# Patient Record
Sex: Female | Born: 1980
Health system: Southern US, Community
[De-identification: ages and names within clinical notes are randomized; demographics above are authoritative.]

## PROBLEM LIST (undated history)

## (undated) ENCOUNTER — Emergency Department (HOSPITAL_COMMUNITY): Disposition: A | Discharge status: Home / Self Care | Payer: BLUE CROSS/BLUE SHIELD

## (undated) ENCOUNTER — Ambulatory Visit (HOSPITAL_COMMUNITY): Admission: EM | Payer: 59

## (undated) ENCOUNTER — Emergency Department (HOSPITAL_COMMUNITY): Admission: EM | Payer: BLUE CROSS/BLUE SHIELD | Source: Home / Self Care

## (undated) DIAGNOSIS — L0291 Cutaneous abscess, unspecified: Secondary | ICD-10-CM

## (undated) DIAGNOSIS — K089 Disorder of teeth and supporting structures, unspecified: Secondary | ICD-10-CM

## (undated) DIAGNOSIS — K219 Gastro-esophageal reflux disease without esophagitis: Secondary | ICD-10-CM

## (undated) DIAGNOSIS — M67479 Ganglion, unspecified ankle and foot: Secondary | ICD-10-CM

## (undated) DIAGNOSIS — N644 Mastodynia: Secondary | ICD-10-CM

## (undated) DIAGNOSIS — M1990 Unspecified osteoarthritis, unspecified site: Secondary | ICD-10-CM

## (undated) DIAGNOSIS — N926 Irregular menstruation, unspecified: Secondary | ICD-10-CM

## (undated) DIAGNOSIS — L0293 Carbuncle, unspecified: Secondary | ICD-10-CM

## (undated) DIAGNOSIS — N979 Female infertility, unspecified: Secondary | ICD-10-CM

## (undated) DIAGNOSIS — N83209 Unspecified ovarian cyst, unspecified side: Secondary | ICD-10-CM

## (undated) DIAGNOSIS — B9689 Other specified bacterial agents as the cause of diseases classified elsewhere: Secondary | ICD-10-CM

## (undated) DIAGNOSIS — A599 Trichomoniasis, unspecified: Secondary | ICD-10-CM

## (undated) DIAGNOSIS — L0292 Furuncle, unspecified: Secondary | ICD-10-CM

## (undated) DIAGNOSIS — Q512 Other doubling of uterus, unspecified: Secondary | ICD-10-CM

## (undated) DIAGNOSIS — R1900 Intra-abdominal and pelvic swelling, mass and lump, unspecified site: Secondary | ICD-10-CM

## (undated) DIAGNOSIS — Z8616 Personal history of COVID-19: Secondary | ICD-10-CM

## (undated) DIAGNOSIS — N760 Acute vaginitis: Secondary | ICD-10-CM

## (undated) DIAGNOSIS — Q5128 Other doubling of uterus, other specified: Secondary | ICD-10-CM

## (undated) DIAGNOSIS — H8309 Labyrinthitis, unspecified ear: Secondary | ICD-10-CM

## (undated) HISTORY — DX: Carbuncle, unspecified: L02.93

## (undated) HISTORY — DX: Furuncle, unspecified: L02.92

## (undated) HISTORY — DX: Mastodynia: N64.4

## (undated) HISTORY — DX: Labyrinthitis, unspecified ear: H83.09

## (undated) HISTORY — DX: Irregular menstruation, unspecified: N92.6

## (undated) HISTORY — DX: Unspecified ovarian cyst, unspecified side: N83.209

## (undated) HISTORY — DX: Female infertility, unspecified: N97.9

## (undated) HISTORY — PX: LAPAROSCOPIC SALPINGOOPHERECTOMY: SUR795

## (undated) HISTORY — PX: OTHER SURGICAL HISTORY: SHX169

## (undated) HISTORY — PX: INCISION AND DRAINAGE: SHX5863

## (undated) HISTORY — DX: Other doubling of uterus, unspecified: Q51.20

## (undated) HISTORY — DX: Disorder of teeth and supporting structures, unspecified: K08.9

---
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
## 1999-05-14 ENCOUNTER — Emergency Department (HOSPITAL_COMMUNITY): Admission: EM | Admit: 1999-05-14 | Discharge: 1999-05-14 | Payer: Self-pay | Admitting: Emergency Medicine

## 1999-06-16 ENCOUNTER — Emergency Department (HOSPITAL_COMMUNITY): Admission: EM | Admit: 1999-06-16 | Discharge: 1999-06-16 | Payer: Self-pay | Admitting: Emergency Medicine

## 1999-06-16 ENCOUNTER — Encounter: Payer: Self-pay | Admitting: Emergency Medicine

## 1999-06-26 ENCOUNTER — Emergency Department (HOSPITAL_COMMUNITY): Admission: EM | Admit: 1999-06-26 | Discharge: 1999-06-26 | Payer: Self-pay | Admitting: Emergency Medicine

## 2000-07-26 ENCOUNTER — Emergency Department (HOSPITAL_COMMUNITY): Admission: EM | Admit: 2000-07-26 | Discharge: 2000-07-26 | Payer: Self-pay | Admitting: Internal Medicine

## 2000-07-28 ENCOUNTER — Emergency Department (HOSPITAL_COMMUNITY): Admission: EM | Admit: 2000-07-28 | Discharge: 2000-07-28 | Payer: Self-pay | Admitting: Emergency Medicine

## 2001-07-04 ENCOUNTER — Inpatient Hospital Stay (HOSPITAL_COMMUNITY): Admission: AD | Admit: 2001-07-04 | Discharge: 2001-07-04 | Payer: Self-pay | Admitting: *Deleted

## 2001-09-10 ENCOUNTER — Emergency Department (HOSPITAL_COMMUNITY): Admission: EM | Admit: 2001-09-10 | Discharge: 2001-09-10 | Payer: Self-pay | Admitting: Emergency Medicine

## 2001-09-10 ENCOUNTER — Encounter: Payer: Self-pay | Admitting: Emergency Medicine

## 2002-04-26 ENCOUNTER — Inpatient Hospital Stay (HOSPITAL_COMMUNITY): Admission: AD | Admit: 2002-04-26 | Discharge: 2002-04-26 | Payer: Self-pay

## 2002-04-27 ENCOUNTER — Encounter: Payer: Self-pay | Admitting: *Deleted

## 2002-04-27 ENCOUNTER — Inpatient Hospital Stay (HOSPITAL_COMMUNITY): Admission: AD | Admit: 2002-04-27 | Discharge: 2002-04-27 | Payer: Self-pay | Admitting: *Deleted

## 2002-05-25 ENCOUNTER — Encounter: Admission: RE | Admit: 2002-05-25 | Discharge: 2002-05-25 | Payer: Self-pay | Admitting: Obstetrics and Gynecology

## 2002-06-27 ENCOUNTER — Encounter: Admission: RE | Admit: 2002-06-27 | Discharge: 2002-06-27 | Payer: Self-pay | Admitting: Obstetrics and Gynecology

## 2002-07-13 ENCOUNTER — Ambulatory Visit (HOSPITAL_COMMUNITY): Admission: RE | Admit: 2002-07-13 | Discharge: 2002-07-13 | Payer: Self-pay | Admitting: Obstetrics and Gynecology

## 2002-07-13 ENCOUNTER — Encounter: Payer: Self-pay | Admitting: Obstetrics and Gynecology

## 2002-07-27 ENCOUNTER — Inpatient Hospital Stay (HOSPITAL_COMMUNITY): Admission: AD | Admit: 2002-07-27 | Discharge: 2002-07-27 | Payer: Self-pay | Admitting: Obstetrics & Gynecology

## 2002-11-22 ENCOUNTER — Emergency Department (HOSPITAL_COMMUNITY): Admission: EM | Admit: 2002-11-22 | Discharge: 2002-11-23 | Payer: Self-pay | Admitting: Emergency Medicine

## 2002-11-23 ENCOUNTER — Encounter: Payer: Self-pay | Admitting: Family Medicine

## 2003-07-06 ENCOUNTER — Inpatient Hospital Stay (HOSPITAL_COMMUNITY): Admission: AD | Admit: 2003-07-06 | Discharge: 2003-07-06 | Payer: Self-pay | Admitting: Family Medicine

## 2003-09-13 ENCOUNTER — Emergency Department (HOSPITAL_COMMUNITY): Admission: EM | Admit: 2003-09-13 | Discharge: 2003-09-13 | Payer: Self-pay | Admitting: Family Medicine

## 2004-03-23 ENCOUNTER — Emergency Department (HOSPITAL_COMMUNITY): Admission: EM | Admit: 2004-03-23 | Discharge: 2004-03-23 | Payer: Self-pay | Admitting: Emergency Medicine

## 2004-05-13 ENCOUNTER — Emergency Department (HOSPITAL_COMMUNITY): Admission: EM | Admit: 2004-05-13 | Discharge: 2004-05-13 | Payer: Self-pay | Admitting: Emergency Medicine

## 2004-05-21 ENCOUNTER — Ambulatory Visit: Payer: Self-pay | Admitting: Internal Medicine

## 2004-05-22 ENCOUNTER — Emergency Department (HOSPITAL_COMMUNITY): Admission: EM | Admit: 2004-05-22 | Discharge: 2004-05-22 | Payer: Self-pay | Admitting: Family Medicine

## 2004-05-29 ENCOUNTER — Ambulatory Visit: Payer: Self-pay | Admitting: Internal Medicine

## 2004-07-22 ENCOUNTER — Ambulatory Visit: Payer: Self-pay | Admitting: Internal Medicine

## 2004-09-08 ENCOUNTER — Ambulatory Visit: Payer: Self-pay | Admitting: *Deleted

## 2004-12-29 ENCOUNTER — Ambulatory Visit: Payer: Self-pay | Admitting: Internal Medicine

## 2005-01-15 ENCOUNTER — Ambulatory Visit: Payer: Self-pay | Admitting: Internal Medicine

## 2005-01-15 ENCOUNTER — Encounter: Payer: Self-pay | Admitting: Internal Medicine

## 2005-01-15 ENCOUNTER — Encounter (INDEPENDENT_AMBULATORY_CARE_PROVIDER_SITE_OTHER): Payer: Self-pay | Admitting: Internal Medicine

## 2005-04-27 ENCOUNTER — Ambulatory Visit: Payer: Self-pay | Admitting: Internal Medicine

## 2005-04-27 ENCOUNTER — Encounter (INDEPENDENT_AMBULATORY_CARE_PROVIDER_SITE_OTHER): Payer: Self-pay | Admitting: Internal Medicine

## 2005-04-27 LAB — CONVERTED CEMR LAB: TSH: 1.874 microintl units/mL

## 2005-05-01 ENCOUNTER — Ambulatory Visit: Payer: Self-pay | Admitting: Internal Medicine

## 2005-05-28 ENCOUNTER — Ambulatory Visit: Payer: Self-pay | Admitting: Internal Medicine

## 2005-07-20 ENCOUNTER — Ambulatory Visit: Payer: Self-pay | Admitting: Internal Medicine

## 2005-10-19 ENCOUNTER — Ambulatory Visit: Payer: Self-pay | Admitting: Family Medicine

## 2005-11-09 ENCOUNTER — Ambulatory Visit: Payer: Self-pay | Admitting: Internal Medicine

## 2006-01-26 ENCOUNTER — Ambulatory Visit: Payer: Self-pay | Admitting: Family Medicine

## 2006-02-27 ENCOUNTER — Emergency Department (HOSPITAL_COMMUNITY): Admission: EM | Admit: 2006-02-27 | Discharge: 2006-02-27 | Payer: Self-pay | Admitting: Emergency Medicine

## 2006-03-02 ENCOUNTER — Ambulatory Visit: Payer: Self-pay | Admitting: Internal Medicine

## 2006-03-03 ENCOUNTER — Ambulatory Visit: Payer: Self-pay | Admitting: Family Medicine

## 2006-03-05 ENCOUNTER — Ambulatory Visit: Payer: Self-pay | Admitting: Internal Medicine

## 2006-06-21 ENCOUNTER — Emergency Department (HOSPITAL_COMMUNITY): Admission: EM | Admit: 2006-06-21 | Discharge: 2006-06-21 | Payer: Self-pay | Admitting: Emergency Medicine

## 2006-06-22 ENCOUNTER — Ambulatory Visit: Payer: Self-pay | Admitting: Family Medicine

## 2006-07-14 ENCOUNTER — Ambulatory Visit: Payer: Self-pay | Admitting: Internal Medicine

## 2006-08-26 ENCOUNTER — Emergency Department (HOSPITAL_COMMUNITY): Admission: EM | Admit: 2006-08-26 | Discharge: 2006-08-26 | Payer: Self-pay | Admitting: Emergency Medicine

## 2006-08-27 ENCOUNTER — Emergency Department (HOSPITAL_COMMUNITY): Admission: EM | Admit: 2006-08-27 | Discharge: 2006-08-27 | Payer: Self-pay | Admitting: Emergency Medicine

## 2006-09-11 ENCOUNTER — Emergency Department (HOSPITAL_COMMUNITY): Admission: EM | Admit: 2006-09-11 | Discharge: 2006-09-11 | Payer: Self-pay | Admitting: Family Medicine

## 2006-09-21 ENCOUNTER — Encounter: Payer: Self-pay | Admitting: Internal Medicine

## 2006-09-21 DIAGNOSIS — N926 Irregular menstruation, unspecified: Secondary | ICD-10-CM | POA: Insufficient documentation

## 2006-09-21 DIAGNOSIS — N921 Excessive and frequent menstruation with irregular cycle: Secondary | ICD-10-CM | POA: Insufficient documentation

## 2006-09-21 HISTORY — DX: Excessive and frequent menstruation with irregular cycle: N92.1

## 2006-10-27 ENCOUNTER — Encounter (INDEPENDENT_AMBULATORY_CARE_PROVIDER_SITE_OTHER): Payer: Self-pay | Admitting: *Deleted

## 2006-12-04 ENCOUNTER — Emergency Department (HOSPITAL_COMMUNITY): Admission: EM | Admit: 2006-12-04 | Discharge: 2006-12-04 | Payer: Self-pay | Admitting: Family Medicine

## 2006-12-13 ENCOUNTER — Telehealth (INDEPENDENT_AMBULATORY_CARE_PROVIDER_SITE_OTHER): Payer: Self-pay | Admitting: Family Medicine

## 2006-12-13 ENCOUNTER — Emergency Department (HOSPITAL_COMMUNITY): Admission: EM | Admit: 2006-12-13 | Discharge: 2006-12-13 | Payer: Self-pay | Admitting: Emergency Medicine

## 2007-01-16 ENCOUNTER — Encounter (INDEPENDENT_AMBULATORY_CARE_PROVIDER_SITE_OTHER): Payer: Self-pay | Admitting: Internal Medicine

## 2007-01-16 LAB — CONVERTED CEMR LAB: Pap Smear: NORMAL

## 2007-01-17 ENCOUNTER — Ambulatory Visit: Payer: Self-pay | Admitting: Family Medicine

## 2007-01-17 ENCOUNTER — Encounter (INDEPENDENT_AMBULATORY_CARE_PROVIDER_SITE_OTHER): Payer: Self-pay | Admitting: Family Medicine

## 2007-01-17 LAB — CONVERTED CEMR LAB
Bilirubin Urine: NEGATIVE
Blood in Urine, dipstick: NEGATIVE
Chlamydia, DNA Probe: NEGATIVE
GC Probe Amp, Genital: NEGATIVE
Ketones, urine, test strip: NEGATIVE
Specific Gravity, Urine: 1.02
pH: 6

## 2007-01-24 ENCOUNTER — Encounter (INDEPENDENT_AMBULATORY_CARE_PROVIDER_SITE_OTHER): Payer: Self-pay | Admitting: Family Medicine

## 2007-01-25 ENCOUNTER — Telehealth (INDEPENDENT_AMBULATORY_CARE_PROVIDER_SITE_OTHER): Payer: Self-pay | Admitting: *Deleted

## 2007-02-08 ENCOUNTER — Ambulatory Visit: Payer: Self-pay | Admitting: Internal Medicine

## 2007-04-14 ENCOUNTER — Ambulatory Visit: Payer: Self-pay | Admitting: Nurse Practitioner

## 2007-04-14 LAB — CONVERTED CEMR LAB
Blood in Urine, dipstick: NEGATIVE
Chlamydia, Swab/Urine, PCR: NEGATIVE
GC Probe Amp, Urine: NEGATIVE
Ketones, urine, test strip: NEGATIVE
Nitrite: NEGATIVE
Protein, U semiquant: NEGATIVE
Specific Gravity, Urine: 1.02
Urobilinogen, UA: 0.2

## 2007-04-19 ENCOUNTER — Telehealth (INDEPENDENT_AMBULATORY_CARE_PROVIDER_SITE_OTHER): Payer: Self-pay | Admitting: Nurse Practitioner

## 2007-06-20 ENCOUNTER — Ambulatory Visit: Payer: Self-pay | Admitting: Family Medicine

## 2007-06-20 ENCOUNTER — Emergency Department (HOSPITAL_COMMUNITY): Admission: EM | Admit: 2007-06-20 | Discharge: 2007-06-20 | Payer: Self-pay | Admitting: Emergency Medicine

## 2007-06-20 DIAGNOSIS — H8309 Labyrinthitis, unspecified ear: Secondary | ICD-10-CM

## 2007-06-20 HISTORY — DX: Labyrinthitis, unspecified ear: H83.09

## 2007-07-27 ENCOUNTER — Telehealth (INDEPENDENT_AMBULATORY_CARE_PROVIDER_SITE_OTHER): Payer: Self-pay | Admitting: *Deleted

## 2007-07-27 ENCOUNTER — Emergency Department (HOSPITAL_COMMUNITY): Admission: EM | Admit: 2007-07-27 | Discharge: 2007-07-27 | Payer: Self-pay | Admitting: Emergency Medicine

## 2007-08-22 ENCOUNTER — Emergency Department (HOSPITAL_COMMUNITY): Admission: EM | Admit: 2007-08-22 | Discharge: 2007-08-22 | Payer: Self-pay | Admitting: Emergency Medicine

## 2007-08-23 ENCOUNTER — Inpatient Hospital Stay (HOSPITAL_COMMUNITY): Admission: AD | Admit: 2007-08-23 | Discharge: 2007-08-23 | Payer: Self-pay | Admitting: Obstetrics & Gynecology

## 2007-08-26 ENCOUNTER — Telehealth (INDEPENDENT_AMBULATORY_CARE_PROVIDER_SITE_OTHER): Payer: Self-pay | Admitting: *Deleted

## 2007-08-28 ENCOUNTER — Emergency Department (HOSPITAL_COMMUNITY): Admission: EM | Admit: 2007-08-28 | Discharge: 2007-08-28 | Payer: Self-pay | Admitting: Family Medicine

## 2007-10-07 ENCOUNTER — Emergency Department (HOSPITAL_COMMUNITY): Admission: EM | Admit: 2007-10-07 | Discharge: 2007-10-07 | Payer: Self-pay | Admitting: Family Medicine

## 2007-12-02 ENCOUNTER — Emergency Department (HOSPITAL_COMMUNITY): Admission: EM | Admit: 2007-12-02 | Discharge: 2007-12-02 | Payer: Self-pay | Admitting: Emergency Medicine

## 2008-02-01 ENCOUNTER — Emergency Department (HOSPITAL_COMMUNITY): Admission: EM | Admit: 2008-02-01 | Discharge: 2008-02-01 | Payer: Self-pay | Admitting: Emergency Medicine

## 2008-02-07 ENCOUNTER — Telehealth (INDEPENDENT_AMBULATORY_CARE_PROVIDER_SITE_OTHER): Payer: Self-pay | Admitting: Family Medicine

## 2008-04-13 ENCOUNTER — Emergency Department (HOSPITAL_COMMUNITY): Admission: EM | Admit: 2008-04-13 | Discharge: 2008-04-13 | Payer: Self-pay | Admitting: Emergency Medicine

## 2008-04-15 ENCOUNTER — Emergency Department (HOSPITAL_COMMUNITY): Admission: EM | Admit: 2008-04-15 | Discharge: 2008-04-15 | Payer: Self-pay | Admitting: Family Medicine

## 2008-04-30 ENCOUNTER — Telehealth (INDEPENDENT_AMBULATORY_CARE_PROVIDER_SITE_OTHER): Payer: Self-pay | Admitting: Family Medicine

## 2008-04-30 DIAGNOSIS — K089 Disorder of teeth and supporting structures, unspecified: Secondary | ICD-10-CM | POA: Insufficient documentation

## 2008-05-01 ENCOUNTER — Emergency Department (HOSPITAL_COMMUNITY): Admission: EM | Admit: 2008-05-01 | Discharge: 2008-05-01 | Payer: Self-pay | Admitting: Family Medicine

## 2008-05-02 ENCOUNTER — Encounter (INDEPENDENT_AMBULATORY_CARE_PROVIDER_SITE_OTHER): Payer: Self-pay | Admitting: Family Medicine

## 2008-05-08 ENCOUNTER — Encounter (INDEPENDENT_AMBULATORY_CARE_PROVIDER_SITE_OTHER): Payer: Self-pay | Admitting: Family Medicine

## 2008-08-29 ENCOUNTER — Emergency Department (HOSPITAL_COMMUNITY): Admission: EM | Admit: 2008-08-29 | Discharge: 2008-08-29 | Payer: Self-pay | Admitting: Family Medicine

## 2008-09-26 ENCOUNTER — Ambulatory Visit: Payer: Self-pay | Admitting: Nurse Practitioner

## 2008-09-26 ENCOUNTER — Encounter (INDEPENDENT_AMBULATORY_CARE_PROVIDER_SITE_OTHER): Payer: Self-pay | Admitting: Nurse Practitioner

## 2008-09-26 DIAGNOSIS — F17200 Nicotine dependence, unspecified, uncomplicated: Secondary | ICD-10-CM | POA: Insufficient documentation

## 2008-09-26 DIAGNOSIS — N979 Female infertility, unspecified: Secondary | ICD-10-CM | POA: Insufficient documentation

## 2008-09-26 LAB — CONVERTED CEMR LAB
ALT: 13 units/L (ref 0–35)
AST: 17 units/L (ref 0–37)
Basophils Relative: 0 % (ref 0–1)
Bilirubin Urine: NEGATIVE
Blood in Urine, dipstick: NEGATIVE
Chlamydia, DNA Probe: NEGATIVE
Chloride: 103 meq/L (ref 96–112)
Creatinine, Ser: 0.95 mg/dL (ref 0.40–1.20)
Eosinophils Absolute: 0.1 10*3/uL (ref 0.0–0.7)
Glucose, Urine, Semiquant: NEGATIVE
KOH Prep: NEGATIVE
Ketones, urine, test strip: NEGATIVE
LDL Cholesterol: 71 mg/dL (ref 0–99)
Lymphs Abs: 2.4 10*3/uL (ref 0.7–4.0)
MCV: 87.1 fL (ref 78.0–100.0)
Neutro Abs: 3 10*3/uL (ref 1.7–7.7)
Neutrophils Relative %: 48 % (ref 43–77)
Nitrite: NEGATIVE
Platelets: 348 10*3/uL (ref 150–400)
Sodium: 138 meq/L (ref 135–145)
TSH: 1.787 microintl units/mL (ref 0.350–4.500)
Total Bilirubin: 0.3 mg/dL (ref 0.3–1.2)
Total CHOL/HDL Ratio: 2.5
VLDL: 15 mg/dL (ref 0–40)
WBC: 6.3 10*3/uL (ref 4.0–10.5)
pH: 6

## 2008-10-01 ENCOUNTER — Encounter (INDEPENDENT_AMBULATORY_CARE_PROVIDER_SITE_OTHER): Payer: Self-pay | Admitting: Nurse Practitioner

## 2008-10-01 ENCOUNTER — Telehealth (INDEPENDENT_AMBULATORY_CARE_PROVIDER_SITE_OTHER): Payer: Self-pay | Admitting: Nurse Practitioner

## 2008-10-07 ENCOUNTER — Emergency Department (HOSPITAL_COMMUNITY): Admission: EM | Admit: 2008-10-07 | Discharge: 2008-10-07 | Payer: Self-pay | Admitting: Emergency Medicine

## 2008-12-25 ENCOUNTER — Emergency Department (HOSPITAL_COMMUNITY): Admission: EM | Admit: 2008-12-25 | Discharge: 2008-12-25 | Payer: Self-pay | Admitting: Emergency Medicine

## 2009-02-03 ENCOUNTER — Emergency Department (HOSPITAL_COMMUNITY): Admission: EM | Admit: 2009-02-03 | Discharge: 2009-02-03 | Payer: Self-pay | Admitting: Emergency Medicine

## 2009-05-05 ENCOUNTER — Emergency Department (HOSPITAL_COMMUNITY): Admission: EM | Admit: 2009-05-05 | Discharge: 2009-05-05 | Payer: Self-pay | Admitting: Family Medicine

## 2009-07-18 ENCOUNTER — Ambulatory Visit: Payer: Self-pay | Admitting: Nurse Practitioner

## 2009-07-18 LAB — CONVERTED CEMR LAB
ALT: 11 units/L (ref 0–35)
AST: 13 units/L (ref 0–37)
Albumin: 4.3 g/dL (ref 3.5–5.2)
BUN: 9 mg/dL (ref 6–23)
Blood in Urine, dipstick: NEGATIVE
Calcium: 9.5 mg/dL (ref 8.4–10.5)
Chloride: 106 meq/L (ref 96–112)
Eosinophils Relative: 1 % (ref 0–5)
GC Probe Amp, Urine: NEGATIVE
HCT: 43.4 % (ref 36.0–46.0)
Hemoglobin: 14.3 g/dL (ref 12.0–15.0)
Lymphocytes Relative: 41 % (ref 12–46)
Lymphs Abs: 2.1 10*3/uL (ref 0.7–4.0)
Nitrite: NEGATIVE
Platelets: 309 10*3/uL (ref 150–400)
Potassium: 4.5 meq/L (ref 3.5–5.3)
Protein, U semiquant: NEGATIVE
Rapid HIV Screen: NEGATIVE
Total Protein: 7.2 g/dL (ref 6.0–8.3)
Urobilinogen, UA: 1
WBC Urine, dipstick: NEGATIVE
WBC: 5.2 10*3/uL (ref 4.0–10.5)

## 2009-07-19 ENCOUNTER — Encounter (INDEPENDENT_AMBULATORY_CARE_PROVIDER_SITE_OTHER): Payer: Self-pay | Admitting: Nurse Practitioner

## 2009-07-22 ENCOUNTER — Ambulatory Visit (HOSPITAL_COMMUNITY): Admission: RE | Admit: 2009-07-22 | Discharge: 2009-07-22 | Payer: Self-pay | Admitting: Internal Medicine

## 2009-07-24 ENCOUNTER — Telehealth (INDEPENDENT_AMBULATORY_CARE_PROVIDER_SITE_OTHER): Payer: Self-pay | Admitting: Nurse Practitioner

## 2009-07-24 DIAGNOSIS — Q5128 Other doubling of uterus, other specified: Secondary | ICD-10-CM | POA: Insufficient documentation

## 2009-07-24 DIAGNOSIS — Q512 Other doubling of uterus, unspecified: Secondary | ICD-10-CM

## 2009-07-24 HISTORY — DX: Other and unspecified doubling of uterus: Q51.28

## 2009-08-22 ENCOUNTER — Emergency Department (HOSPITAL_COMMUNITY): Admission: EM | Admit: 2009-08-22 | Discharge: 2009-08-22 | Payer: Self-pay | Admitting: Family Medicine

## 2009-11-25 ENCOUNTER — Emergency Department (HOSPITAL_COMMUNITY): Admission: EM | Admit: 2009-11-25 | Discharge: 2009-11-25 | Payer: Self-pay | Admitting: Family Medicine

## 2009-12-06 ENCOUNTER — Emergency Department (HOSPITAL_COMMUNITY)
Admission: EM | Admit: 2009-12-06 | Discharge: 2009-12-06 | Payer: Self-pay | Source: Home / Self Care | Admitting: Emergency Medicine

## 2009-12-06 ENCOUNTER — Emergency Department (HOSPITAL_COMMUNITY): Admission: EM | Admit: 2009-12-06 | Discharge: 2009-12-06 | Payer: Self-pay | Admitting: Family Medicine

## 2009-12-06 ENCOUNTER — Encounter (INDEPENDENT_AMBULATORY_CARE_PROVIDER_SITE_OTHER): Payer: Self-pay | Admitting: Nurse Practitioner

## 2009-12-17 ENCOUNTER — Telehealth (INDEPENDENT_AMBULATORY_CARE_PROVIDER_SITE_OTHER): Payer: Self-pay | Admitting: Nurse Practitioner

## 2009-12-27 ENCOUNTER — Ambulatory Visit: Payer: Self-pay | Admitting: Nurse Practitioner

## 2009-12-27 DIAGNOSIS — L0292 Furuncle, unspecified: Secondary | ICD-10-CM | POA: Insufficient documentation

## 2009-12-27 DIAGNOSIS — L0293 Carbuncle, unspecified: Secondary | ICD-10-CM

## 2009-12-27 LAB — CONVERTED CEMR LAB
Glucose, Urine, Semiquant: NEGATIVE
Nitrite: NEGATIVE
Protein, U semiquant: NEGATIVE
Urobilinogen, UA: 0.2
WBC Urine, dipstick: NEGATIVE

## 2010-02-20 ENCOUNTER — Emergency Department (HOSPITAL_COMMUNITY)
Admission: EM | Admit: 2010-02-20 | Discharge: 2010-02-20 | Payer: Self-pay | Source: Home / Self Care | Admitting: Family Medicine

## 2010-02-24 LAB — POCT URINALYSIS DIPSTICK
Bilirubin Urine: NEGATIVE
Hgb urine dipstick: NEGATIVE
Ketones, ur: NEGATIVE mg/dL
Nitrite: NEGATIVE
Protein, ur: NEGATIVE mg/dL
Specific Gravity, Urine: 1.02 (ref 1.005–1.030)
Urine Glucose, Fasting: NEGATIVE mg/dL
Urobilinogen, UA: 1 mg/dL (ref 0.0–1.0)
pH: 7 (ref 5.0–8.0)

## 2010-02-24 LAB — RPR: RPR Ser Ql: NONREACTIVE

## 2010-02-24 LAB — WET PREP, GENITAL
Trich, Wet Prep: NONE SEEN
Yeast Wet Prep HPF POC: NONE SEEN

## 2010-02-24 LAB — POCT PREGNANCY, URINE: Preg Test, Ur: NEGATIVE

## 2010-02-24 LAB — HIV ANTIBODY (ROUTINE TESTING W REFLEX): HIV: NONREACTIVE

## 2010-02-24 LAB — GC/CHLAMYDIA PROBE AMP, GENITAL
Chlamydia, DNA Probe: NEGATIVE
GC Probe Amp, Genital: UNDETERMINED

## 2010-03-03 ENCOUNTER — Emergency Department (HOSPITAL_COMMUNITY)
Admission: EM | Admit: 2010-03-03 | Discharge: 2010-03-03 | Payer: Self-pay | Source: Home / Self Care | Admitting: Emergency Medicine

## 2010-03-04 LAB — HEPATIC FUNCTION PANEL
ALT: 14 U/L (ref 0–35)
AST: 19 U/L (ref 0–37)
Albumin: 4.1 g/dL (ref 3.5–5.2)
Alkaline Phosphatase: 67 U/L (ref 39–117)
Bilirubin, Direct: 0.2 mg/dL (ref 0.0–0.3)
Indirect Bilirubin: 0.7 mg/dL (ref 0.3–0.9)
Total Bilirubin: 0.9 mg/dL (ref 0.3–1.2)
Total Protein: 7.7 g/dL (ref 6.0–8.3)

## 2010-03-04 LAB — POCT I-STAT, CHEM 8
BUN: 10 mg/dL (ref 6–23)
Calcium, Ion: 1.19 mmol/L (ref 1.12–1.32)
Chloride: 105 mEq/L (ref 96–112)
Creatinine, Ser: 1.1 mg/dL (ref 0.4–1.2)
Glucose, Bld: 84 mg/dL (ref 70–99)
HCT: 39 % (ref 36.0–46.0)
Hemoglobin: 13.3 g/dL (ref 12.0–15.0)
Potassium: 3.4 mEq/L — ABNORMAL LOW (ref 3.5–5.1)
Sodium: 140 mEq/L (ref 135–145)
TCO2: 24 mmol/L (ref 0–100)

## 2010-03-04 LAB — LIPASE, BLOOD: Lipase: 19 U/L (ref 11–59)

## 2010-03-05 ENCOUNTER — Emergency Department (HOSPITAL_COMMUNITY)
Admission: EM | Admit: 2010-03-05 | Discharge: 2010-03-05 | Payer: Self-pay | Source: Home / Self Care | Admitting: Emergency Medicine

## 2010-03-05 LAB — URINALYSIS, ROUTINE W REFLEX MICROSCOPIC
Hgb urine dipstick: NEGATIVE
Ketones, ur: 15 mg/dL — AB
Nitrite: NEGATIVE
Protein, ur: NEGATIVE mg/dL
Specific Gravity, Urine: 1.03 (ref 1.005–1.030)
Urine Glucose, Fasting: NEGATIVE mg/dL
Urobilinogen, UA: 1 mg/dL (ref 0.0–1.0)
pH: 8 (ref 5.0–8.0)

## 2010-03-05 LAB — COMPREHENSIVE METABOLIC PANEL
ALT: 11 U/L (ref 0–35)
AST: 19 U/L (ref 0–37)
Albumin: 3.8 g/dL (ref 3.5–5.2)
Alkaline Phosphatase: 65 U/L (ref 39–117)
BUN: 5 mg/dL — ABNORMAL LOW (ref 6–23)
CO2: 24 mEq/L (ref 19–32)
Calcium: 9 mg/dL (ref 8.4–10.5)
Chloride: 106 mEq/L (ref 96–112)
Creatinine, Ser: 0.89 mg/dL (ref 0.4–1.2)
GFR calc Af Amer: >60 mL/min (ref >60)
GFR calc non Af Amer: >60 mL/min (ref >60)
Glucose, Bld: 88 mg/dL (ref 70–99)
Potassium: 3.5 mEq/L (ref 3.5–5.1)
Sodium: 139 mEq/L (ref 135–145)
Total Bilirubin: 0.4 mg/dL (ref 0.3–1.2)
Total Protein: 7 g/dL (ref 6.0–8.3)

## 2010-03-05 LAB — DIFFERENTIAL
Basophils Absolute: 0 10*3/uL (ref 0.0–0.1)
Basophils Relative: 0 % (ref 0–1)
Eosinophils Absolute: 0 10*3/uL (ref 0.0–0.7)
Eosinophils Relative: 1 % (ref 0–5)
Lymphocytes Relative: 24 % (ref 12–46)
Lymphs Abs: 1 10*3/uL (ref 0.7–4.0)
Monocytes Absolute: 0.7 10*3/uL (ref 0.1–1.0)
Monocytes Relative: 16 % — ABNORMAL HIGH (ref 3–12)
Neutro Abs: 2.7 10*3/uL (ref 1.7–7.7)
Neutrophils Relative %: 60 % (ref 43–77)

## 2010-03-05 LAB — CBC
HCT: 33.7 % — ABNORMAL LOW (ref 36.0–46.0)
Hemoglobin: 11.5 g/dL — ABNORMAL LOW (ref 12.0–15.0)
MCH: 28.8 pg (ref 26.0–34.0)
MCHC: 34.1 g/dL (ref 30.0–36.0)
MCV: 84.3 fL (ref 78.0–100.0)
Platelets: 279 10*3/uL (ref 150–400)
RBC: 4 MIL/uL (ref 3.87–5.11)
RDW: 14.1 % (ref 11.5–15.5)
WBC: 4.4 10*3/uL (ref 4.0–10.5)

## 2010-03-05 LAB — WET PREP, GENITAL
Clue Cells Wet Prep HPF POC: NONE SEEN
Trich, Wet Prep: NONE SEEN
Yeast Wet Prep HPF POC: NONE SEEN

## 2010-03-05 LAB — LIPASE, BLOOD: Lipase: 22 U/L (ref 11–59)

## 2010-03-05 LAB — POCT PREGNANCY, URINE: Preg Test, Ur: NEGATIVE

## 2010-03-06 ENCOUNTER — Inpatient Hospital Stay (HOSPITAL_COMMUNITY)
Admission: AD | Admit: 2010-03-06 | Discharge: 2010-03-07 | Payer: Self-pay | Attending: Obstetrics & Gynecology | Admitting: Obstetrics & Gynecology

## 2010-03-06 LAB — GC/CHLAMYDIA PROBE AMP, GENITAL
Chlamydia, DNA Probe: NEGATIVE
GC Probe Amp, Genital: NEGATIVE

## 2010-03-07 LAB — CA 125: CA 125: 43.6 U/mL — ABNORMAL HIGH (ref 0.0–30.2)

## 2010-03-08 NOTE — Progress Notes (Signed)
NAMECHERI, [name]                 ACCOUNT NO.:  [id number]  MEDICAL RECORD NO.:  [id number]          PATIENT TYPE:  INP  LOCATION:  [facility]                    FACILITY:  [facility]  PHYSICIAN:  Kerri Bossier, [name]        DATE OF BIRTH:  May 15, 1980  DATE OF SERVICE:                                 CLINIC NOTE  ADMISSION DIAGNOSIS:  Bilateral tubo-ovarian abscess.  DISCHARGE DIAGNOSES:  Bilateral endometriomas, unstable.  Septate uterus.  DISPOSITION:  Home.  DIET:  As tolerated.  CONDITION:  Stable.  DISPOSITION:  Home.  DIET:  As tolerated.  ACTIVITY:  As tolerated.  MEDICATIONS:  She has prescription ibuprofen at home at 800 mg to be used every 8 hours as necessary.  I am giving her prescription for Percocet 325/5 mg one p.o. q.4 hours p.r.n. pain #30 no refills.  FOLLOWUP:  I have made an appointment in the GYN Clinic for March 17, 2010 at 12:45, and I have recommended that she call [name]. (Reproductive Endocrinology) as soon as possible.  BRIEF HISTORY OF PRESENT ILLNESS AND HOSPITAL COURSE:  [name] is a 30- year-old gravida 0 who was seen in the emergency room with pelvic pain and back pain.  She was initially diagnosed with tubo-ovarian abscesses, based on her ultrasound.  However, I have reviewed her clinical evidence and I feel that bilateral endometriomas is her diagnosis.  I have discussed this at length with her and the implications for her fertility.  Please note the CT scan that was done also showed a septate uterus.  She and her partner had all their questions answered, and she will follow up as above.  Physical exam is within normal limits.  She was afebrile throughout her hospital course.  She tolerated regular diet well.     Kerri Bossier, [name]    MCD/MEDQ  D:  03/07/2010  T:  03/08/2010  Job:  [id number]

## 2010-03-11 NOTE — Progress Notes (Signed)
Summary: FEMALE ISSUE  Phone Note Call from Patient Call back at Home Phone [phone]   Reason for Call: Talk to Nurse Summary of Call: MARTIN PT. MS Connolly SAYS THAT SHE HAS AITCH IN HER PRIVATE AREA FOR ABOUT A WEEK. Initial call taken by: Leodis Rains,  December 17, 2009 10:31 AM  Follow-up for Phone Call        Has had a lot of itching in vaginal area, especially near the urethral meatus, external and internal labia.  Does have some burning when urinating, has some frequency, no urgency.  Denies hematuria or other bleeding.   Has moderately thick white discharge, with odor.  Has some little bumps.   Has not changed soaps or laundry detergent, is not using scented lotions or soaps, is wearing cotton-lilned panties.  Is drinking plenty of water.   Advised to continue above and avoid douching. Offered a wet prep, but pt. wants physical examination of area.  Also queried if she could have an STD by her symptoms, advised need of appointment.  Appt. 12/27/09 -- she will check back for cancellations.   Follow-up by: Dutch Quint RN,  December 17, 2009 4:23 PM  Additional Follow-up for Phone Call Additional follow up Details #1::        noted Additional Follow-up by: Lehman Prom FNP,  December 18, 2009 8:31 AM

## 2010-03-11 NOTE — Assessment & Plan Note (Signed)
Summary: Irregular menstrual cycles   Vital Signs:  Patient profile:   30 year old female Menstrual status:  irregular [name]:     06/11/2009 Weight:      167.8 pounds BMI:     31.82 Temp:     98.0 degrees F oral Pulse rate:   80 / minute Pulse rhythm:   regular Resp:     20 per minute BP sitting:   90 / 60  (left arm) Cuff size:   large  Vitals Entered By: Levon Hedger (July 18, 2009 10:56 AM) CC: irregular periods since april....lower abdominal cramps for 1 week with vaginal discharge Is Patient Diabetic? No Pain Assessment Patient in pain? yes     Location: LOWER abdomen Intensity: 6  Does patient need assistance? Functional Status Self care Ambulation Normal [name] (date): 06/11/2009 [name] - Character: normal     Menstrual Status irregular Enter [name]: 06/11/2009 Last PAP Result  Specimen Adequacy: Satisfactory for evaluation.   Interpretation/Result:Negative for intraepithelial Lesion or Malignancy.   Interpretation/Result:Trichomonas Vaginalis present.       Primary Care Provider:  Lehman Prom FNP  CC:  irregular periods since april....lower abdominal cramps for 1 week with vaginal discharge.  History of Present Illness:  Pt into the office for f/u C/o problems with her menses since February 2011 February -  normal menses lasting 5-6 days.  usually minimal cramping with moderate. March - still with spotting and then menses started April - no menses May - spotting for 4-5 day then lasted 5-6 days of regular flow. (Pt sporatic in her recall)  Recurrent Bacterial vaginosis - pt reports that she has been both in this office.  She has questions about why she keeps having BV. ? if Trichomonas and BV are the same Pt is sexually active and does not always use condoms since she is actively trying to get pregnant  Pt has been trying to get preganant for many years and has not been successful. She has never been preganant despite many attempts. She has an  ultrasound many years ago and she ? a problem with her tubes but is unsure what.  Habits & Providers  Alcohol-Tobacco-Diet     Alcohol type: mixed drink     Tobacco Status: current  Exercise-Depression-Behavior     Does Patient Exercise: no     STD Risk: never     STD Risk Counseling: not indicated-no STD risk noted     Contraception Counseling: questions answered     Drug Use: no     Seat Belt Use: 100     Sun Exposure: occasionally  Allergies (verified): No Known Drug Allergies  Review of Systems CV:  Denies chest pain or discomfort. Resp:  Denies cough. GI:  Complains of abdominal pain; denies nausea and vomiting. GU:  Denies discharge.  Physical Exam  General:  alert.   Head:  normocephalic.   Ears:  ear piercing(s) noted.   Lungs:  normal breath sounds.   Heart:  normal rate and regular rhythm.   Abdomen:  bil  lower abd tenderness with palpation Msk:  normal ROM.   Neurologic:  alert & oriented X3.   Skin:  color normal.   Psych:  Oriented X3.     Impression & Recommendations:  Problem # 1:  IRREGULAR MENSTRUATION (ICD-626.4) advise pt to monitor  Problem # 2:  FEMALE INFERTILITY (ICD-628.9) will order ultrasound  Orders: T-Comprehensive Metabolic Panel (19147-82956) T-CBC w/Diff (21308-65784) T-TSH (69629-52841) Rapid HIV  (32440) Ultrasound (Ultrasound)  Problem # 3:  TOBACCO ABUSE (ICD-305.1) advised cesation  Complete Medication List: 1)  Diclofenac Sodium 75 Mg Tbec (Diclofenac sodium) .... One tablet by mouth two times a day as needed for pain  Other Orders: UA Dipstick w/o Micro (manual) (52841) KOH/ WET Mount [phone]) T-GC Probe, urine [phone])  Patient Instructions: 1)  Wet prep and urine looks ok today. 2)  Will send sample to the lab to be sure. 3)  Abdominal pain - Since your periods have been irregular ? abdominal tenderness due to hormones.  Take diclofenac 75mg  by mouth two times a day x 3 days (with food) then as needed    4)  You will be scheduled for a pelvic and transvaginal ultrasound to check the ovaries, tubes and uterus. 5)  Start a multivitamin with folic acid daily 6)  Start your efforts to quit smoking. 7)  Start an exercise program even if it is walking 10-15 minutes daily. 8)  Follow up in this office 2 weeks after ultrasound for the results Prescriptions: DICLOFENAC SODIUM 75 MG TBEC (DICLOFENAC SODIUM) One tablet by mouth two times a day as needed for pain  #30 x 0   Entered and Authorized by:   Lehman Prom FNP   Signed by:   Lehman Prom FNP on 07/18/2009   Method used:   Print then Give to Patient   RxID:   [id number]   Laboratory Results   Urine Tests  Date/Time Received: July 18, 2009 11:10 AM   Routine Urinalysis   Color: lt. yellow Glucose: negative   (Normal Range: Negative) Bilirubin: negative   (Normal Range: Negative) Ketone: negative   (Normal Range: Negative) Spec. Gravity: >=1.030   (Normal Range: 1.003-1.035) Blood: negative   (Normal Range: Negative) pH: 6.0   (Normal Range: 5.0-8.0) Protein: negative   (Normal Range: Negative) Urobilinogen: 1.0   (Normal Range: 0-1) Nitrite: negative   (Normal Range: Negative) Leukocyte Esterace: negative   (Normal Range: Negative)    Date/Time Received: July 18, 2009 12:46 PM   Wet Mount/KOH Source: vaginal WBC/hpf: 1-5 Bacteria/hpf: rare Clue cells/hpf: none Yeast/hpf: none Trichomonas/hpf: none  Other Tests  Rapid HIV: negative      Laboratory Results   Urine Tests    Routine Urinalysis   Color: lt. yellow Glucose: negative   (Normal Range: Negative) Bilirubin: negative   (Normal Range: Negative) Ketone: negative   (Normal Range: Negative) Spec. Gravity: >=1.030   (Normal Range: 1.003-1.035) Blood: negative   (Normal Range: Negative) pH: 6.0   (Normal Range: 5.0-8.0) Protein: negative   (Normal Range: Negative) Urobilinogen: 1.0   (Normal Range: 0-1) Nitrite: negative   (Normal Range:  Negative) Leukocyte Esterace: negative   (Normal Range: Negative)      Wet Mount Wet Mount KOH: Negative  Other Tests  Rapid HIV: negative

## 2010-03-11 NOTE — Progress Notes (Signed)
Summary: ultrasound results  Phone Note Call from Patient Call back at [phone]   Summary of Call: The pt needs ultrasound results.  Please call her back. [facility] FNP Initial call taken by: Manon Hilding,  July 24, 2009 9:22 AM  Follow-up for Phone Call        forward to N. 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  For the most part people can have normal pregnancies with this however some people with this do have an increase risk of miscarriage. This can be corrected with surgery if necessary but in the majority of people it is not corrected Additional Follow-up by: Lehman Prom FNP,  July 24, 2009 5:01 PM    Additional Follow-up for Phone Call Additional follow up Details #2::    Spoke with pt. and advised of [place] results -- states that she has been trying to get pregnant and since she's 29, doesn't know when that will happen. Wants more information on surgery.  Dutch Quint RN  July 25, 2009 12:00 PM   Unfortunately I can't tell her WHEN she will get pregnant, it may prove to be a little more difficult but not impossible. Pt can do an internet search on Sepatate uterus to see what additional information she can find [facility] clinic at [facility] does not see referrals for [facility] issues [facility] but they don't routinely see pts for fertility issues either or they may charge a co-payement n.[email or web address],fnp July 25, 2009 12:21 PM    Additional Follow-up for Phone Call Additional follow up Details #3:: Details for Additional Follow-up Action Taken: Spoke with pt. and advised of provider's response and recommendations.  Pt. states will f/u with other  resources for information and resolution.  Dutch Quint RN  July 26, 2009 12:28 PM  Additional Follow-up by: Dutch Quint RN,  July 26, 2009 12:28 PM

## 2010-03-11 NOTE — Letter (Signed)
Summary: TEST ORDER FORM//ULTRASOUND//APPT DATE & TIME  TEST ORDER FORM//ULTRASOUND//APPT DATE & TIME   Imported By: Arta Bruce 07/18/2009 15:25:29  _____________________________________________________________________  External Attachment:    Type:   Image     Comment:   External Document

## 2010-03-11 NOTE — Letter (Signed)
Summary: *HSN Results Follow up  [facility]  [address] [place], [place] [address]   Phone: [phone]  Fax: [phone]      07/19/2009   REYES FIFIELD [address] [place], [place]  [address]   Dear  Ms. Aislynn Hord,                            ____S.Drinkard,FNP   ____D. Gore,FNP       ____B. McPherson,MD   ____V. Rankins,MD    ____E. Mulberry,MD    __X__N. Daphine Deutscher, FNP  ____D. Reche Dixon, MD    ____K. Philipp Deputy, MD    ____Other     This letter is to inform you that your recent test(s):  _______Pap Smear    ___X____Lab Test     _______X-ray    ___X____ is within acceptable limits  _______ requires a medication change  _______ requires a follow-up lab visit  _______ requires a follow-up visit with your provider   Comments: Labs done during recent office visit is normal.       _________________________________________________________ If you have any questions, please contact our office [phone].                    Sincerely,    Lehman Prom FNP [facility]

## 2010-03-11 NOTE — Assessment & Plan Note (Signed)
Summary: Acute - Vaginal Discharge   Vital Signs:  Patient profile:   30 year old female Menstrual status:  irregular [name]:     12/04/2009 Weight:      170.5 pounds Temp:     99.1 degrees F oral Pulse rate:   80 / minute Pulse rhythm:   regular Resp:     20 per minute BP sitting:   106 / 76  (right arm) Cuff size:   large  Vitals Entered By: Levon Hedger (December 27, 2009 3:59 PM) CC: vaginal itching with discharge x 1 week Is Patient Diabetic? No Pain Assessment Patient in pain? no       Does patient need assistance? Functional Status Self care Ambulation Normal [name] (date): 12/04/2009 [name] - Character: normal     Enter [name]: 12/04/2009 Last PAP Result  Specimen Adequacy: Satisfactory for evaluation.   Interpretation/Result:Negative for intraepithelial Lesion or Malignancy.   Interpretation/Result:Trichomonas Vaginalis present.       Primary Care Provider:  Lehman Prom FNP  CC:  vaginal itching with discharge x 1 week.  History of Present Illness:  Pt into the office with itching and discharge +external irritatins +discharge (thick) +abdominal cramps -fever -dysuria -hematuria  Habits & Providers  Alcohol-Tobacco-Diet     Alcohol type: mixed drink     Tobacco Status: current     Tobacco Counseling: to quit use of tobacco products     Cigarette Packs/Day: <0.25  Exercise-Depression-Behavior     Does Patient Exercise: no     STD Risk: never     STD Risk Counseling: not indicated-no STD risk noted     Contraception Counseling: questions answered     Drug Use: no     Seat Belt Use: 100     Sun Exposure: occasionally  Allergies: No Known Drug Allergies  Social History: Packs/Day:  <0.25  Review of Systems General:  Denies fever. CV:  Denies chest pain or discomfort. Resp:  Denies cough. GI:  Denies abdominal pain, nausea, and vomiting. MS:  Denies joint pain.  Physical Exam  General:  alert.   Head:  normocephalic.   Lungs:   normal breath sounds.   Heart:  normal rate and regular rhythm.   Abdomen:  normal bowel sounds.   Msk:  normal ROM.   Neurologic:  alert & oriented X3.   Skin:  left axilla - tender, flutucant boil, erythema (pt refused lancing) inner thigh - small boil Psych:  Oriented X3.     Impression & Recommendations:  Problem # 1:  BACTERIAL VAGINOSIS (ICD-616.10) pt would like metrogel (she will get from [facility]) The following medications were removed from the medication list:    Metronidazole 500 Mg Tabs (Metronidazole) ..... One tablet by mouth two times a day for infection Her updated medication list for this problem includes:    Metrogel-vaginal 0.75 % Gel (Metronidazole) ..... One application intravaginally for infection  Orders: UA Dipstick w/o Micro (manual) ([address]) KOH/ WET Mount [phone]) refused the flu vaccine  Problem # 3:  TOBACCO ABUSE (ICD-305.1) advised cessation  Problem # 4:  BOILS, RECURRENT (ICD-680.9) advised pt to use hibiclens to affected area  Complete Medication List: 1)  Diclofenac Sodium 75 Mg Tbec (Diclofenac sodium) .... One tablet by mouth two times a day as needed for pain 2)  Metrogel-vaginal 0.75 % Gel (Metronidazole) .... One application intravaginally for infection 3)  Chlorhexidine Gluconate 0.12 % Soln (Chlorhexidine gluconate) .... Apply to affected area - let sit for 5 minutes then rinse  Patient Instructions: 1)  You have declined the flu vaccine today. If you change your mind come into the office for a nurse visit. 2)  You have bacterial vaginosis.  Use the vaginal cream nightly for 5 nights 3)  Follow up as needed Prescriptions: CHLORHEXIDINE GLUCONATE 0.12 % SOLN (CHLORHEXIDINE GLUCONATE) Apply to affected area - let sit for 5 minutes then rinse  #463ml x 0   Entered and Authorized by:   Lehman Prom FNP   Signed by:   Lehman Prom FNP on 12/27/2009   Method used:   Print then Give to Patient   RxID:    [id number] METROGEL-VAGINAL 0.75 % GEL (METRONIDAZOLE) One application intravaginally for infection  #45gm x 0   Entered and Authorized by:   Lehman Prom FNP   Signed by:   Lehman Prom FNP on 12/27/2009   Method used:   Print then Give to Patient   RxID:   [id number] FLUCONAZOLE 150 MG TABS (FLUCONAZOLE) One tablet by mouth x 1 dose  #1 x 0   Entered and Authorized by:   Lehman Prom FNP   Signed by:   Lehman Prom FNP on 12/27/2009   Method used:   Print then Give to Patient   RxID:   [id number] METRONIDAZOLE 500 MG TABS (METRONIDAZOLE) One tablet by mouth two times a day for infection  #14 x 0   Entered and Authorized by:   Lehman Prom FNP   Signed by:   Lehman Prom FNP on 12/27/2009   Method used:   Print then Give to Patient   RxID:   [id number]    Orders Added: 1)  Est. Patient Level III [42595] 2)  UA Dipstick w/o Micro (manual) [81002] 3)  KOH/ WET Mount [87210]    Prevention & Chronic Care Immunizations   Influenza vaccine: Not documented   Influenza vaccine deferral: Refused  (12/27/2009)    Tetanus booster: 12/11/2006: [facility] urgent care per pt    Pneumococcal vaccine: Not documented  Other Screening   Pap smear:  Specimen Adequacy: Satisfactory for evaluation.   Interpretation/Result:Negative for intraepithelial Lesion or Malignancy.   Interpretation/Result:Trichomonas Vaginalis present.      (09/26/2008)   Smoking status: current  (12/27/2009)   Laboratory Results   Urine Tests  Date/Time Received: December 27, 2009 4:52 PM   Routine Urinalysis   Color: lt. yellow Glucose: negative   (Normal Range: Negative) Bilirubin: negative   (Normal Range: Negative) Ketone: negative   (Normal Range: Negative) Spec. Gravity: 1.020   (Normal Range: 1.003-1.035) Blood: negative   (Normal Range: Negative) pH: 6.0   (Normal Range: 5.0-8.0) Protein: negative   (Normal Range: Negative) Urobilinogen: 0.2   (Normal Range:  0-1) Nitrite: negative   (Normal Range: Negative) Leukocyte Esterace: negative   (Normal Range: Negative)    Date/Time Received: December 27, 2009 4:51 PM   [employer] Source: vaginal WBC/hpf: 1-5 Bacteria/hpf: rare Clue cells/hpf: moderate Yeast/hpf: none Wet Mount KOH: Negative Trichomonas/hpf: none

## 2010-03-17 ENCOUNTER — Ambulatory Visit (INDEPENDENT_AMBULATORY_CARE_PROVIDER_SITE_OTHER): Payer: Self-pay | Admitting: Obstetrics & Gynecology

## 2010-03-17 ENCOUNTER — Encounter (INDEPENDENT_AMBULATORY_CARE_PROVIDER_SITE_OTHER): Payer: Self-pay | Admitting: *Deleted

## 2010-03-17 DIAGNOSIS — N949 Unspecified condition associated with female genital organs and menstrual cycle: Secondary | ICD-10-CM

## 2010-03-17 LAB — CONVERTED CEMR LAB: Yeast Wet Prep HPF POC: NONE SEEN

## 2010-03-25 ENCOUNTER — Other Ambulatory Visit: Payer: Self-pay | Admitting: Obstetrics & Gynecology

## 2010-03-25 ENCOUNTER — Ambulatory Visit (HOSPITAL_COMMUNITY)
Admission: RE | Admit: 2010-03-25 | Discharge: 2010-03-25 | Disposition: A | Discharge status: Home / Self Care | Payer: Self-pay | Source: Ambulatory Visit | Attending: Obstetrics & Gynecology | Admitting: Obstetrics & Gynecology

## 2010-03-25 DIAGNOSIS — N736 Female pelvic peritoneal adhesions (postinfective): Secondary | ICD-10-CM

## 2010-03-25 DIAGNOSIS — N949 Unspecified condition associated with female genital organs and menstrual cycle: Secondary | ICD-10-CM

## 2010-03-25 DIAGNOSIS — N7013 Chronic salpingitis and oophoritis: Secondary | ICD-10-CM

## 2010-03-25 HISTORY — PX: OVARIAN CYST REMOVAL: SHX89

## 2010-03-25 LAB — CBC
HCT: 38.8 % (ref 36.0–46.0)
Hemoglobin: 12.8 g/dL (ref 12.0–15.0)
MCH: 28.3 pg (ref 26.0–34.0)
MCHC: 33 g/dL (ref 30.0–36.0)
MCV: 85.7 fL (ref 78.0–100.0)

## 2010-04-01 NOTE — Op Note (Signed)
NAMEMILTON, STREICHER                 ACCOUNT NO.:  [id number]  MEDICAL RECORD NO.:  [id number]           PATIENT TYPE:  O  LOCATION:  [facility]                          FACILITY:  [facility]  PHYSICIAN:  Scheryl Darter, MD       DATE OF BIRTH:  1980-08-08  DATE OF PROCEDURE:  03/25/2010 DATE OF DISCHARGE:  03/25/2010                              OPERATIVE REPORT   PROCEDURES: 1. Laparoscopy. 2. Lysis of adhesions. 3. Right salpingo-oophorectomy.  PREOPERATIVE DIAGNOSES:  Pelvic pain and right pelvic mass.  POSTOPERATIVE DIAGNOSES: 1. Extensive pelvic adhesions. 2. Right hydrosalpinx ovarian mass with probable endometriosis.  SURGEON:  Scheryl Darter, MD  ASSISTANT:  Horton Chin, MD  ANESTHESIA:  General.  ESTIMATED BLOOD LOSS:  Less than 100 mL.  SPECIMEN:  Right fallopian tube and ovary.  FINDINGS:  Pelvic adhesions, especially on the right with right hydrosalpinx and hemorrhagic ovarian cyst and perihepatic adhesions.  COMPLICATIONS:  None.  DRAINS:  None.  COUNTS:  Correct.  OPERATIVE COURSE:  The patient gave written consent for diagnostic and possible operative laparoscopy.  The patient had presented at the end of January with pelvic pain and CT and ultrasound showed right pelvic masses.  The patient identification was confirmed.  She was brought to the OR and general anesthesia was induced.  She was placed in dorsal lithotomy position.  Exam revealed a right adnexal mass about 6-7 cm which was difficult to separate from the uterus.  No left adnexal mass. Abdomen, perineum, and vagina were sterilely prepped and draped and Foley catheter was placed.  Hulka tenaculum was placed on the cervix.  A #11 blade was used to make to a transverse infraumbilical skin incision about 1.5 cm across.  Abdomen was elevated and Veress needle was placed. CO2 was insufflated.  Needle was removed and an 11-mm trocar was placed. Laparoscope was inserted with video camera in use.  The  patient was placed in Trendelenburg position.  There were omental adhesions to the pelvis including the dome of the uterus.  Second puncture was with a 5- mm port in the left lower quadrant and 11-mm port in the right lower quadrant were placed.  Using Harmonic scalpel, adhesions of the omentum to the uterus were lysed.  The right adnexa was inspected.  The ovary and right fallopian tube were significantly involved with adhesions and the right fallopian tube was swollen consistent with the hydrosalpinx and ovary was swollen consistent with a hemorrhagic cyst.  Pelvis was irrigated.  Due to obvious damage to the right fallopian tube and adhesions to the ovary and suspicion for ovarian endometrioma, we decided to proceed with a right salpingo-oophorectomy.  Adhesions were lysed on the right side.  The infundibulopelvic ligament was identified and opened and the vessels were skeletonized.  The Harmonic scalpel were used to coagulate the ovarian vessels.  Good hemostasis was assured. The fallopian tube was cauterized and cut at the uterine fundus and the round ligament was likewise taken down.  Once the specimen was free and good hemostasis was assured, the specimen was removed using an Endopouch through the incision in  the right lower quadrant.  In order to remove it, the specimen was taken down in pieces.  The specimen was sent to pathology.  The trocar was replaced in the right lower quadrant.  There was adhesion of the rectum to the tube and in the posterior cul-de-sac. We elected to not attempt to lyse the adhesion in order to protect the bowel.  A piece of Surgicel was placed in the cul-de-sac where the right adnexal mass had been removed.  Good hemostasis was seen.  The right upper quadrant was inspected and there were adhesions of the hepatic capsule to the peritoneum.  This was consistent with Fitz-Hugh-Curtis syndrome.  Pelvis was irrigated.  The appendix was seen and  appeared normal.  Pneumoperitoneum was released and all instruments were removed. 0-Vicryl sutures were placed in the fascia in the right lower quadrant and at the umbilicus.  Interrupted sutures with 4-0 Vicryl were placed in the skin and the skin was closed with Octylseal adhesive.  The patient tolerated the procedure well without complications.  She was brought in stable condition to the recovery room.  The Foley catheter which had been placed during the procedure was removed at the end of procedure.     Scheryl Darter, MD     JA/MEDQ  D:  03/25/2010  T:  03/26/2010  Job:  [id number]  Electronically Signed by Scheryl Darter MD on 04/01/2010 11:14:22 AM

## 2010-04-14 ENCOUNTER — Ambulatory Visit (INDEPENDENT_AMBULATORY_CARE_PROVIDER_SITE_OTHER): Payer: Self-pay | Admitting: Obstetrics & Gynecology

## 2010-04-14 DIAGNOSIS — Z09 Encounter for follow-up examination after completed treatment for conditions other than malignant neoplasm: Secondary | ICD-10-CM

## 2010-04-14 DIAGNOSIS — N80109 Endometriosis of ovary, unspecified side, unspecified depth: Secondary | ICD-10-CM

## 2010-04-14 DIAGNOSIS — N801 Endometriosis of ovary: Secondary | ICD-10-CM

## 2010-04-22 ENCOUNTER — Ambulatory Visit (INDEPENDENT_AMBULATORY_CARE_PROVIDER_SITE_OTHER): Payer: Self-pay

## 2010-04-22 DIAGNOSIS — N801 Endometriosis of ovary: Secondary | ICD-10-CM

## 2010-04-22 DIAGNOSIS — N80109 Endometriosis of ovary, unspecified side, unspecified depth: Secondary | ICD-10-CM

## 2010-04-23 LAB — POCT URINALYSIS DIPSTICK
Bilirubin Urine: NEGATIVE
Glucose, UA: NEGATIVE mg/dL
Nitrite: NEGATIVE
Urobilinogen, UA: 0.2 mg/dL (ref 0.0–1.0)
pH: 7 (ref 5.0–8.0)

## 2010-04-23 LAB — DIFFERENTIAL
Basophils Absolute: 0 10*3/uL (ref 0.0–0.1)
Eosinophils Relative: 1 % (ref 0–5)
Lymphocytes Relative: 37 % (ref 12–46)
Lymphs Abs: 2.6 10*3/uL (ref 0.7–4.0)
Monocytes Absolute: 0.8 10*3/uL (ref 0.1–1.0)
Monocytes Relative: 11 % (ref 3–12)
Neutro Abs: 3.6 10*3/uL (ref 1.7–7.7)

## 2010-04-23 LAB — CBC
HCT: 36.8 % (ref 36.0–46.0)
Hemoglobin: 12.5 g/dL (ref 12.0–15.0)
MCV: 82.9 fL (ref 78.0–100.0)
RDW: 14 % (ref 11.5–15.5)
WBC: 7 10*3/uL (ref 4.0–10.5)

## 2010-04-23 LAB — GC/CHLAMYDIA PROBE AMP, GENITAL
Chlamydia, DNA Probe: NEGATIVE
GC Probe Amp, Genital: NEGATIVE

## 2010-04-23 LAB — BASIC METABOLIC PANEL
BUN: 7 mg/dL (ref 6–23)
Chloride: 108 mEq/L (ref 96–112)
GFR calc non Af Amer: >60 mL/min (ref >60)
Potassium: 3.4 mEq/L — ABNORMAL LOW (ref 3.5–5.1)
Sodium: 138 mEq/L (ref 135–145)

## 2010-04-23 LAB — WET PREP, GENITAL: WBC, Wet Prep HPF POC: NONE SEEN

## 2010-04-27 LAB — POCT URINALYSIS DIP (DEVICE)
Hgb urine dipstick: NEGATIVE
Nitrite: NEGATIVE
Protein, ur: NEGATIVE mg/dL
Urobilinogen, UA: 1 mg/dL (ref 0.0–1.0)
pH: 6 (ref 5.0–8.0)

## 2010-04-27 LAB — WET PREP, GENITAL: WBC, Wet Prep HPF POC: NONE SEEN

## 2010-04-28 NOTE — H&P (Signed)
[name], [name]                 ACCOUNT NO.:  [id number]  MEDICAL RECORD NO.:  [id number]           PATIENT TYPE:  A  LOCATION:  [facility]                   FACILITY:  [facility]  PHYSICIAN:  Scheryl Darter, MD       DATE OF BIRTH:  [birth year]  DATE OF SERVICE:  03/17/2010                          PRE-OP HISTORY & PHYSICAL  CHIEF COMPLAINTS:  Lower abdominal pain.  The patient is a 30 year old black female gravida 0, last menstrual period March 02, 2010, who what is admitted on March 05, 2010, due to pelvic and back pain.  The patient states that she began having pain few days prior to admission.  She has had some chills but no fevers. She has also had some nausea.  Her pain began shortly before her last period.  Cycles are regular.  It lasts about 4 days.  She had a CT scan and ultrasound during hospitalization.  Ultrasound showed uterus measuring 7.6 x 4.2 x 4.8 cm with 6 mm endometrium.  In the right adnexa, she had a complex cystic and solid mass measuring 6.8 x 6.2 cm. Left ovary appeared normal to 4.1 x 1.4 cm and a mass in the left adnexa measuring 4.0 x 2.5 cm.  The patient previous had ultrasound showing a septate uterus.  She has long history of infertility.  PAST MEDICAL HISTORY:  None.  MEDICATIONS:  Percocet, which she recently ran out.  ALLERGIES:  She has sensitivity to FLAGYL which causes an upset stomach.  GYNECOLOGIC HISTORY:  The patient has had bacterial vaginosis, but no STDs.  PAST SURGICAL HISTORY:  Removal of a breast mass.  FAMILY HISTORY:  Unremarkable.  REVIEW OF SYSTEMS:  The patient still has pain.  No fever.  She has some urinary frequency and some dysuria and pain with bowel movements.  No constipation or diarrhea or blood per rectum.  PHYSICAL EXAM:  GENERAL:  No acute distress. VITAL SIGNS:  Weight is 169 pounds, blood pressure 103/71, pulse 80, temperature 98.4.  Affect is normal. ABDOMEN:  Nondistended, soft, and minimally tender in the  suprapubic area with no mass. EXTREMITIES:  No swelling or deformities. PELVIC:  External genitalia, vagina, and cervix appeared normal except for slight whitish discharge.  Wet prep was obtained.  She had mild cervical motion tenderness.  Uterus normal size.  No adnexal masses. There is some tenderness in both adnexa.  IMPRESSION:  Pelvic pain and right and left adnexal mass.  She was initially admitted with diagnosis of pelvic inflammatory disease and tubo-ovarian abscess.  Possible diagnosis is endometriosis.  She says she has a septate uterus, this was demonstrated on previous ultrasound. She would like to have the septate uterus managed.  I suggest she sees Dr. April Manson for this if she wants to have that corrected.  I offered a diagnostic laparoscopy.  Discussed the procedure and the risks of anesthesia, bleeding, infection, bowel or urinary tract damage, or possibility of a laparotomy.  Questions were answered.  She will be scheduled for possible operative laparoscopy as an outpatient.     Scheryl Darter, MD    JA/MEDQ  D:  03/17/2010  T:  03/18/2010  Job:  [id number]

## 2010-05-02 NOTE — Progress Notes (Signed)
[name], [name]                 ACCOUNT NO.:  [id number]  MEDICAL RECORD NO.:  [id number]           PATIENT TYPE:  A  LOCATION:  [facility]                   FACILITY:  [facility]  PHYSICIAN:  Scheryl Darter, MD       DATE OF BIRTH:  [birth year]  DATE OF SERVICE:                                 CLINIC NOTE  The patient comes today postoperative from a laparoscopy on March 25, 2010, with lysis of adhesions and right salpingo-oophorectomy.  The patient had a large endometrioma and extensive adhesions and endometriosis.  She was also found to have perihepatic adhesions consistent with Fitz-Hugh-Curtis syndrome from previous pelvic inflammatory disease.  The patient says she is still having some pain, but her incisions are healing well.  She is having trouble sleeping partly because of pain.  MEDICATIONS:  Naproxen 1 p.o. q.4-6 h. p.r.n. pain.  She has sensitivity to Flagyl.  GENERAL:  She is in no acute distress. ABDOMEN:  Soft and nontender with well-healed incisions at the umbilicus in the right and left lower quadrant. PELVIC EXAM:  Deferred today.  IMPRESSION:  Endometriosis.  PLAN:  I discussed using Lupron Depot to treat her endometriosis for about 6 months.  I explained that she will have a temporary medical menopause and she may have vasomotor symptoms after treatment.  I offered add-back therapy with progestin.  Because of her sleep problems and nighttime pain and chronic pain issues, I think it is reasonable to treat her with amitriptyline for sleep.  I gave her prescription for amitriptyline 25 mg p.o. at bedtime.  She will return when Lupron Depot 11.25 mg IM is available for administration.  I gave her a prescription for Provera 10 mg p.o. daily.     Scheryl Darter, MD    JA/MEDQ  D:  04/14/2010  T:  04/15/2010  Job:  [id number]

## 2010-05-04 LAB — WET PREP, GENITAL
Trich, Wet Prep: NONE SEEN
Yeast Wet Prep HPF POC: NONE SEEN

## 2010-05-04 LAB — POCT PREGNANCY, URINE: Preg Test, Ur: NEGATIVE

## 2010-05-04 LAB — GC/CHLAMYDIA PROBE AMP, GENITAL: GC Probe Amp, Genital: NEGATIVE

## 2010-05-04 LAB — POCT URINALYSIS DIP (DEVICE)
Bilirubin Urine: NEGATIVE
Glucose, UA: NEGATIVE mg/dL
Protein, ur: NEGATIVE mg/dL

## 2010-05-05 ENCOUNTER — Inpatient Hospital Stay (HOSPITAL_COMMUNITY)
Admission: AD | Admit: 2010-05-05 | Discharge: 2010-05-05 | Disposition: A | Discharge status: Home / Self Care | Payer: Self-pay | Source: Ambulatory Visit | Attending: Obstetrics & Gynecology | Admitting: Obstetrics & Gynecology

## 2010-05-05 DIAGNOSIS — R51 Headache: Secondary | ICD-10-CM | POA: Insufficient documentation

## 2010-05-05 LAB — POCT PREGNANCY, URINE: Preg Test, Ur: NEGATIVE

## 2010-05-12 LAB — WET PREP, GENITAL: WBC, Wet Prep HPF POC: NONE SEEN

## 2010-05-12 LAB — POCT URINALYSIS DIP (DEVICE)
Ketones, ur: NEGATIVE mg/dL
Protein, ur: NEGATIVE mg/dL
Specific Gravity, Urine: 1.025 (ref 1.005–1.030)

## 2010-05-12 LAB — POCT PREGNANCY, URINE: Preg Test, Ur: NEGATIVE

## 2010-05-14 LAB — POCT URINALYSIS DIP (DEVICE)
Glucose, UA: NEGATIVE mg/dL
Hgb urine dipstick: NEGATIVE
Nitrite: NEGATIVE
Urobilinogen, UA: 0.2 mg/dL (ref 0.0–1.0)
pH: 7.5 (ref 5.0–8.0)

## 2010-05-14 LAB — WET PREP, GENITAL

## 2010-05-18 LAB — POCT URINALYSIS DIP (DEVICE)
Bilirubin Urine: NEGATIVE
Glucose, UA: NEGATIVE mg/dL
Ketones, ur: NEGATIVE mg/dL

## 2010-05-18 LAB — WET PREP, GENITAL: Yeast Wet Prep HPF POC: NONE SEEN

## 2010-05-18 LAB — GC/CHLAMYDIA PROBE AMP, GENITAL: Chlamydia, DNA Probe: NEGATIVE

## 2010-05-18 LAB — POCT PREGNANCY, URINE: Preg Test, Ur: NEGATIVE

## 2010-05-22 LAB — CULTURE, ROUTINE-ABSCESS

## 2010-07-08 ENCOUNTER — Ambulatory Visit: Payer: Self-pay

## 2010-07-11 ENCOUNTER — Other Ambulatory Visit: Payer: Self-pay | Admitting: Obstetrics and Gynecology

## 2010-07-11 ENCOUNTER — Ambulatory Visit (INDEPENDENT_AMBULATORY_CARE_PROVIDER_SITE_OTHER): Payer: Self-pay | Admitting: Obstetrics and Gynecology

## 2010-07-11 DIAGNOSIS — Z01419 Encounter for gynecological examination (general) (routine) without abnormal findings: Secondary | ICD-10-CM

## 2010-07-11 DIAGNOSIS — Z124 Encounter for screening for malignant neoplasm of cervix: Secondary | ICD-10-CM

## 2010-07-12 NOTE — Group Therapy Note (Signed)
[name], [name]                 ACCOUNT NO.:  [id number]  MEDICAL RECORD NO.:  [id number]           PATIENT TYPE:  A  LOCATION:  [facility]                   FACILITY:  [facility]  PHYSICIAN:  Argentina Donovan, MD        DATE OF BIRTH:  05/07/1980  DATE OF SERVICE:  07/11/2010                                 CLINIC NOTE  The patient is a 30 year old African American female who underwent laparoscopy and salpingo-oophorectomy from endometriosis with lysis of adhesions, has been for 3 months on Depo-Lupron as well as amitriptyline for sleep and feedback Provera.  She was to stop the Provera.  I told her that it is fine getting her a shot of Depo-Lupron today, also we examined her breasts that were symmetrical.  No dominant masses.  No nipple discharge.  No supraclavicular or axillary nodes, but she has axillary hidradenitis as well as in the groin.  The abdomen is soft and nontender.  No masses or organomegaly.  External genitalia is normal. BUS within normal limits.  Vagina is clean and well rugated.  Pap smear and wet prep were taken.  The uterus is anterior, normal size, shape, consistency.  Adnexa could not be palpated in this patient.  IMPRESSION:  Endometriosis, in for an annual Pap, told that she probably will need this one every 3 years now since the last four have been normal.  Next time she comes in, should see Dr. Debroah Loop to determine whether she needs a third Depo-Lupron shot.          ______________________________ Argentina Donovan, MD    PR/MEDQ  D:  07/11/2010  T:  07/12/2010  Job:  [id number]

## 2010-08-28 ENCOUNTER — Inpatient Hospital Stay (INDEPENDENT_AMBULATORY_CARE_PROVIDER_SITE_OTHER)
Admission: RE | Admit: 2010-08-28 | Discharge: 2010-08-28 | Disposition: A | Discharge status: Home / Self Care | Payer: Self-pay | Source: Ambulatory Visit | Attending: Family Medicine | Admitting: Family Medicine

## 2010-08-28 DIAGNOSIS — N760 Acute vaginitis: Secondary | ICD-10-CM

## 2010-08-28 LAB — WET PREP, GENITAL

## 2010-08-28 LAB — POCT URINALYSIS DIP (DEVICE)
Glucose, UA: NEGATIVE mg/dL
Ketones, ur: NEGATIVE mg/dL
Specific Gravity, Urine: >=1.03 (ref 1.005–1.030)

## 2010-08-28 LAB — POCT PREGNANCY, URINE: Preg Test, Ur: NEGATIVE

## 2010-09-19 ENCOUNTER — Inpatient Hospital Stay (INDEPENDENT_AMBULATORY_CARE_PROVIDER_SITE_OTHER)
Admission: RE | Admit: 2010-09-19 | Discharge: 2010-09-19 | Disposition: A | Discharge status: Home / Self Care | Payer: Self-pay | Source: Ambulatory Visit | Attending: Family Medicine | Admitting: Family Medicine

## 2010-09-19 DIAGNOSIS — M25519 Pain in unspecified shoulder: Secondary | ICD-10-CM

## 2010-11-03 ENCOUNTER — Ambulatory Visit (INDEPENDENT_AMBULATORY_CARE_PROVIDER_SITE_OTHER): Payer: Self-pay | Admitting: Obstetrics & Gynecology

## 2010-11-03 ENCOUNTER — Encounter: Payer: Self-pay | Admitting: Obstetrics & Gynecology

## 2010-11-03 VITALS — BP 114/82 | HR 89 | Temp 97.7°F | Ht 62.0 in | Wt 172.8 lb

## 2010-11-03 DIAGNOSIS — N803 Endometriosis of pelvic peritoneum, unspecified: Secondary | ICD-10-CM

## 2010-11-03 DIAGNOSIS — IMO0002 Reserved for concepts with insufficient information to code with codable children: Secondary | ICD-10-CM

## 2010-11-03 MED ORDER — NORGESTIM-ETH ESTRAD TRIPHASIC 0.18/0.215/0.25 MG-25 MCG PO TABS: 1.0000 | ORAL_TABLET | Freq: Every day | ORAL | Status: DC

## 2010-11-03 MED ORDER — NORGESTIM-ETH ESTRAD TRIPHASIC 0.18/0.215/0.25 MG-35 MCG PO TABS: 1.0000 | ORAL_TABLET | Freq: Every day | ORAL | Status: DC

## 2010-11-03 NOTE — Progress Notes (Addendum)
  Subjective:    Patient ID: [name], female    DOB: 11-05-1980, 30 y.o.   MRN: [id number]  HPIPt with endometriosis dx 03/2010 at [facility], RSO done. Now s/p 6 mo on Lupron Depot, amenorrheic, no pain, with VMS. Would like OCP for now,  G0P0 History reviewed. No pertinent past medical history. Endometriosis Past Surgical History  Procedure Date  . Ovarian cyst removal Mar 25 2010  . Biopsy of right breast    No Known Allergies   Review of Systems    Objective:   Physical Exam NAD, pleasant Abd not tender, soft Pelvic: cx not tender, no mass, uterus nl         Assessment & Plan:  Endometriosis s/p adequate hormonal therapy. Rx TriSprintec, RTC 6 mo.

## 2010-11-03 NOTE — Progress Notes (Signed)
Addended by: Adam Phenix on: 11/03/2010 03:47 PM   Modules accepted: Orders

## 2010-11-03 NOTE — Progress Notes (Signed)
Addended by: Adam Phenix on: 11/03/2010 03:23 PM   Modules accepted: Orders

## 2010-11-04 ENCOUNTER — Telehealth: Payer: Self-pay | Admitting: *Deleted

## 2010-11-04 NOTE — Telephone Encounter (Signed)
Pt returned call. Advised pt that new Rx for birth control pills were sent to her pharmacy and she can pick those up. Pt voiced understanding.

## 2010-11-04 NOTE — Telephone Encounter (Signed)
Spoke with pt advised of new rx for birth control

## 2010-11-05 LAB — POCT I-STAT, CHEM 8
Chloride: 103
Creatinine, Ser: 1.1
Glucose, Bld: 89
Hemoglobin: 15.3 — ABNORMAL HIGH
Potassium: 3.9

## 2010-11-05 LAB — POCT URINALYSIS DIP (DEVICE)
Glucose, UA: NEGATIVE
Hgb urine dipstick: NEGATIVE
Specific Gravity, Urine: 1.02
Urobilinogen, UA: 0.2
pH: 5.5

## 2010-11-05 LAB — POCT PREGNANCY, URINE: Preg Test, Ur: NEGATIVE

## 2010-11-06 LAB — POCT URINALYSIS DIP (DEVICE)
Ketones, ur: NEGATIVE
Protein, ur: 30 — AB
pH: 7.5

## 2010-11-06 LAB — POCT PREGNANCY, URINE
Operator id: 247071
Operator id: 282151
Preg Test, Ur: NEGATIVE

## 2010-11-06 LAB — WET PREP, GENITAL
Clue Cells Wet Prep HPF POC: NONE SEEN
Trich, Wet Prep: NONE SEEN
Trich, Wet Prep: NONE SEEN

## 2010-11-06 LAB — URINALYSIS, ROUTINE W REFLEX MICROSCOPIC
Glucose, UA: NEGATIVE
Leukocytes, UA: NEGATIVE
Nitrite: NEGATIVE
Protein, ur: NEGATIVE
Urobilinogen, UA: 0.2

## 2010-11-06 LAB — CBC
RBC: 4.8
WBC: 4.8

## 2010-11-06 LAB — URINE MICROSCOPIC-ADD ON

## 2010-11-07 LAB — POCT URINALYSIS DIP (DEVICE)
Glucose, UA: NEGATIVE
Nitrite: NEGATIVE
Urobilinogen, UA: 0.2
pH: 5.5

## 2010-11-07 LAB — GC/CHLAMYDIA PROBE AMP, GENITAL: Chlamydia, DNA Probe: NEGATIVE

## 2010-11-07 LAB — WET PREP, GENITAL: Yeast Wet Prep HPF POC: NONE SEEN

## 2010-11-10 DIAGNOSIS — N83209 Unspecified ovarian cyst, unspecified side: Secondary | ICD-10-CM | POA: Insufficient documentation

## 2010-11-10 LAB — WET PREP, GENITAL: Trich, Wet Prep: NONE SEEN

## 2010-11-10 LAB — POCT URINALYSIS DIP (DEVICE)
Glucose, UA: NEGATIVE
Ketones, ur: 15 — AB
pH: 7

## 2010-11-10 LAB — POCT PREGNANCY, URINE: Preg Test, Ur: NEGATIVE

## 2010-11-13 LAB — POCT URINALYSIS DIP (DEVICE)
Hgb urine dipstick: NEGATIVE
Protein, ur: 30 mg/dL — AB
Specific Gravity, Urine: 1.025 (ref 1.005–1.030)
Urobilinogen, UA: 1 mg/dL (ref 0.0–1.0)
pH: 5.5 (ref 5.0–8.0)

## 2010-11-13 LAB — GC/CHLAMYDIA PROBE AMP, GENITAL
Chlamydia, DNA Probe: NEGATIVE
GC Probe Amp, Genital: NEGATIVE

## 2010-11-13 LAB — WET PREP, GENITAL
WBC, Wet Prep HPF POC: NONE SEEN
Yeast Wet Prep HPF POC: NONE SEEN

## 2010-11-13 LAB — POCT PREGNANCY, URINE: Preg Test, Ur: NEGATIVE

## 2010-11-18 LAB — POCT PREGNANCY, URINE
Operator id: 235561
Preg Test, Ur: NEGATIVE

## 2010-11-18 LAB — POCT URINALYSIS DIP (DEVICE)
Nitrite: NEGATIVE
Operator id: 235561
Protein, ur: NEGATIVE
Urobilinogen, UA: 0.2
pH: 6.5

## 2010-11-24 LAB — COMPREHENSIVE METABOLIC PANEL
ALT: 20
AST: 24
Albumin: 3.7
CO2: 27
Chloride: 108
GFR calc Af Amer: >60
GFR calc non Af Amer: >60
Potassium: 4
Sodium: 137
Total Bilirubin: 0.4

## 2010-11-24 LAB — URINALYSIS, ROUTINE W REFLEX MICROSCOPIC
Bilirubin Urine: NEGATIVE
Hgb urine dipstick: NEGATIVE
Ketones, ur: NEGATIVE
Nitrite: NEGATIVE
Specific Gravity, Urine: 1.019
pH: 8

## 2010-11-24 LAB — OCCULT BLOOD X 1 CARD TO LAB, STOOL: Fecal Occult Bld: NEGATIVE

## 2010-11-24 LAB — DIFFERENTIAL
Basophils Absolute: 0.1
Eosinophils Absolute: 0.1
Eosinophils Relative: 1
Monocytes Absolute: 0.6

## 2010-11-24 LAB — CBC
MCHC: 33.8
MCV: 85.1
RBC: 4.57
RDW: 15.1 — ABNORMAL HIGH

## 2010-11-24 LAB — POCT PREGNANCY, URINE
Operator id: 285841
Preg Test, Ur: NEGATIVE

## 2010-12-19 ENCOUNTER — Encounter (HOSPITAL_COMMUNITY): Payer: Self-pay | Admitting: Cardiology

## 2010-12-19 ENCOUNTER — Emergency Department (INDEPENDENT_AMBULATORY_CARE_PROVIDER_SITE_OTHER)
Admission: EM | Admit: 2010-12-19 | Discharge: 2010-12-19 | Disposition: A | Discharge status: Home / Self Care | Payer: Self-pay | Source: Home / Self Care

## 2010-12-19 DIAGNOSIS — N760 Acute vaginitis: Secondary | ICD-10-CM

## 2010-12-19 DIAGNOSIS — N898 Other specified noninflammatory disorders of vagina: Secondary | ICD-10-CM

## 2010-12-19 LAB — POCT URINALYSIS DIP (DEVICE)
Glucose, UA: NEGATIVE mg/dL
Hgb urine dipstick: NEGATIVE
Ketones, ur: NEGATIVE mg/dL
Specific Gravity, Urine: 1.025 (ref 1.005–1.030)
Urobilinogen, UA: 0.2 mg/dL (ref 0.0–1.0)

## 2010-12-19 LAB — POCT PREGNANCY, URINE: Preg Test, Ur: NEGATIVE

## 2010-12-19 LAB — WET PREP, GENITAL
Clue Cells Wet Prep HPF POC: NONE SEEN
Trich, Wet Prep: NONE SEEN
WBC, Wet Prep HPF POC: NONE SEEN
Yeast Wet Prep HPF POC: NONE SEEN

## 2010-12-19 MED ORDER — METRONIDAZOLE 500 MG PO TABS: 500.0000 mg | ORAL_TABLET | Freq: Two times a day (BID) | ORAL | Status: AC

## 2010-12-19 NOTE — ED Notes (Signed)
Pt presents with lower abdominal pain with vaginal discharge (white) with odor for the past 3 days.

## 2010-12-19 NOTE — ED Provider Notes (Signed)
History     CSN: [id number] Arrival date & time: 12/19/2010  9:50 AM   First MD Initiated Contact with Patient 12/19/10 1054      Chief Complaint  Patient presents with  . Vaginal Discharge  . Abdominal Pain    (Consider location/radiation/quality/duration/timing/severity/associated sxs/prior treatment) Patient is a 30 y.o. female presenting with vaginal discharge. The history is provided by the patient.  Vaginal Discharge This is a new (h/o recurrent BV ) problem. Episode onset: for 3 days. Pertinent negatives include no headaches. Associated symptoms comments: Low abdominal cramping. Had been on hormonal injections by her GyN after ovarian cyst surgery 03/2010. [name] 04/2010; Was now switched to oral contraceptives that she has not started yet. Pregnancy test negative. Reports having protected sex. Denies vaginal bleeding, dysuria, fever or chills. . The symptoms are aggravated by nothing. The symptoms are relieved by nothing. She has tried nothing for the symptoms.    Past Medical History  Diagnosis Date  . Ovarian cyst     Past Surgical History  Procedure Date  . Ovarian cyst removal Mar 25 2010  . Biopsy of right breast   . Breast biopsy 1999    right  . Ovarian cyst removal 03/25/2010    right    Family History  Problem Relation Age of Onset  . Hypertension Mother   . Diabetes Mother   . Cancer Father   . Hypertension Father   . Diabetes Brother   . Hypertension Brother     History  Substance Use Topics  . Smoking status: Current Everyday Smoker -- 0.2 packs/day for 13 years    Types: Cigarettes  . Smokeless tobacco: Never Used  . Alcohol Use: Yes     occas.    OB History    Grav Para Term Preterm Abortions TAB SAB Ect Mult Living   0         0      Review of Systems  Constitutional: Negative for fever, chills, activity change and appetite change.  Gastrointestinal: Negative for nausea and vomiting.       Low abdominal cramping.   Genitourinary:  Positive for vaginal discharge. Negative for dysuria, frequency, hematuria, flank pain, vaginal bleeding and difficulty urinating.  Musculoskeletal: Negative for arthralgias.  Skin: Negative for rash.  Neurological: Negative for dizziness and headaches.    Allergies  Flagyl  Home Medications   Current Outpatient Rx  Name Route Sig Dispense Refill  . METRONIDAZOLE 500 MG PO TABS Oral Take 1 tablet (500 mg total) by mouth 2 (two) times daily. 14 tablet 0  . NORGESTIM-ETH ESTRAD TRIPHASIC 0.18/0.215/0.25 MG-35 MCG PO TABS Oral Take 1 tablet by mouth daily. 1 Package 11    BP 106/77  Pulse 79  Temp(Src) 98 F (36.7 C) (Oral)  Resp 16  Wt 175 lb (79.379 kg)  SpO2 100%  [name] 04/18/2010  Physical Exam  Nursing note and vitals reviewed. Constitutional: She is oriented to person, place, and time. She appears well-developed and well-nourished. No distress.  Eyes: Conjunctivae are normal. Pupils are equal, round, and reactive to light. No scleral icterus.  Cardiovascular: Normal rate and regular rhythm.   Pulmonary/Chest: Effort normal and breath sounds normal.  Abdominal: Soft. Bowel sounds are normal. She exhibits no distension and no mass. There is no tenderness. There is no rebound and no guarding.  Genitourinary: Uterus normal. Cervix exhibits no motion tenderness, no discharge and no friability. Right adnexum displays no mass. Left adnexum displays no mass. Vaginal discharge  found.    Neurological: She is alert and oriented to person, place, and time.  Skin: Skin is warm. Rash noted.    ED Course  Procedures (including critical care time)  Labs Reviewed  POCT URINALYSIS DIP (DEVICE) - Abnormal; Notable for the following:    Protein, ur 30 (*)    All other components within normal limits  POCT PREGNANCY, URINE  WET PREP, GENITAL  GC/CHLAMYDIA PROBE AMP, GENITAL   No results found.   1. Vaginal discharge   2. Vaginitis       MDM          Calayah Guadarrama  [place] 12/20/10 1541

## 2010-12-20 LAB — GC/CHLAMYDIA PROBE AMP, GENITAL: GC Probe Amp, Genital: NEGATIVE

## 2010-12-26 ENCOUNTER — Encounter (HOSPITAL_COMMUNITY): Payer: Self-pay

## 2010-12-26 ENCOUNTER — Emergency Department (INDEPENDENT_AMBULATORY_CARE_PROVIDER_SITE_OTHER)
Admission: EM | Admit: 2010-12-26 | Discharge: 2010-12-26 | Disposition: A | Discharge status: Home / Self Care | Payer: Self-pay | Source: Home / Self Care | Attending: Family Medicine | Admitting: Family Medicine

## 2010-12-26 DIAGNOSIS — J069 Acute upper respiratory infection, unspecified: Secondary | ICD-10-CM

## 2010-12-26 MED ORDER — AMOXICILLIN 500 MG PO CAPS: 500.0000 mg | ORAL_CAPSULE | Freq: Three times a day (TID) | ORAL | Status: AC

## 2010-12-26 MED ORDER — GUAIFENESIN-CODEINE 100-10 MG/5ML PO SYRP: 5.0000 mL | ORAL_SOLUTION | Freq: Four times a day (QID) | ORAL | Status: AC | PRN

## 2010-12-26 NOTE — ED Provider Notes (Signed)
History     CSN: [id number] Arrival date & time: 12/26/2010  3:04 PM   First MD Initiated Contact with Patient 12/26/10 1415      Chief Complaint  Patient presents with  . Cough    (Consider location/radiation/quality/duration/timing/severity/associated sxs/prior treatment) Patient is a 30 y.o. female presenting with cough. The history is provided by the patient.  Cough This is a new problem. The current episode started more than 1 week ago. The problem occurs constantly. The problem has not changed since onset.The cough is non-productive. There has been no fever. Associated symptoms include rhinorrhea. Pertinent negatives include no wheezing. She is a smoker.    Past Medical History  Diagnosis Date  . Ovarian cyst     Past Surgical History  Procedure Date  . Ovarian cyst removal Mar 25 2010  . Biopsy of right breast   . Breast biopsy 1999    right  . Ovarian cyst removal 03/25/2010    right    Family History  Problem Relation Age of Onset  . Hypertension Mother   . Diabetes Mother   . Cancer Father   . Hypertension Father   . Diabetes Brother   . Hypertension Brother     History  Substance Use Topics  . Smoking status: Current Everyday Smoker -- 0.2 packs/day for 13 years    Types: Cigarettes  . Smokeless tobacco: Never Used  . Alcohol Use: Yes     occas.    OB History    Grav Para Term Preterm Abortions TAB SAB Ect Mult Living   0         0      Review of Systems  Constitutional: Negative.   HENT: Positive for rhinorrhea and sinus pressure.   Respiratory: Positive for cough. Negative for wheezing.   Cardiovascular: Negative.   Gastrointestinal: Negative.   Skin: Negative.     Allergies  Review of patient's allergies indicates no known allergies.  Home Medications   Current Outpatient Rx  Name Route Sig Dispense Refill  . METRONIDAZOLE 500 MG PO TABS Oral Take 1 tablet (500 mg total) by mouth 2 (two) times daily. 14 tablet 0  . OVER THE  COUNTER MEDICATION  Nyquil, tussin, alka seltzer, mucous relief     . AMOXICILLIN 500 MG PO CAPS Oral Take 1 capsule (500 mg total) by mouth 3 (three) times daily. 30 capsule 0  . GUAIFENESIN-CODEINE 100-10 MG/5ML PO SYRP Oral Take 5 mLs by mouth every 6 (six) hours as needed for cough. 120 mL 0  . NORGESTIM-ETH ESTRAD TRIPHASIC 0.18/0.215/0.25 MG-35 MCG PO TABS Oral Take 1 tablet by mouth daily. 1 Package 11    BP 121/87  Pulse 89  Temp(Src) 98.5 F (36.9 C) (Oral)  Resp 16  SpO2 100%  [name] 03/20/2010  Physical Exam  Constitutional: She appears well-developed and well-nourished. No distress.  HENT:  Head: Normocephalic and atraumatic.  Right Ear: External ear normal.  Left Ear: External ear normal.  Mouth/Throat: Oropharynx is clear and moist.       Nasal congestion.   Neck: Normal range of motion. Neck supple.  Cardiovascular: Normal rate and regular rhythm.   Pulmonary/Chest: Effort normal. She has no wheezes.  Lymphadenopathy:    She has no cervical adenopathy.  Skin: Skin is warm and dry.    ED Course  Procedures (including critical care time)  Labs Reviewed - No data to display No results found.   1. URI (upper respiratory infection)  MDM          Randa Spike, MD 12/26/10 [phone]

## 2010-12-26 NOTE — ED Notes (Signed)
C/o congested cough, nasal congestion and chest hurts with cough for almost 1 week.

## 2011-02-23 ENCOUNTER — Encounter (HOSPITAL_COMMUNITY): Payer: Self-pay

## 2011-02-23 ENCOUNTER — Emergency Department (INDEPENDENT_AMBULATORY_CARE_PROVIDER_SITE_OTHER)
Admission: EM | Admit: 2011-02-23 | Discharge: 2011-02-23 | Disposition: A | Discharge status: Home / Self Care | Payer: Self-pay | Source: Home / Self Care | Attending: Family Medicine | Admitting: Family Medicine

## 2011-02-23 DIAGNOSIS — N760 Acute vaginitis: Secondary | ICD-10-CM

## 2011-02-23 LAB — POCT URINALYSIS DIP (DEVICE)
Hgb urine dipstick: NEGATIVE
Leukocytes, UA: NEGATIVE
Nitrite: NEGATIVE
Protein, ur: NEGATIVE mg/dL
Urobilinogen, UA: 0.2 mg/dL (ref 0.0–1.0)
pH: 7 (ref 5.0–8.0)

## 2011-02-23 LAB — WET PREP, GENITAL: Yeast Wet Prep HPF POC: NONE SEEN

## 2011-02-23 LAB — POCT PREGNANCY, URINE: Preg Test, Ur: NEGATIVE

## 2011-02-23 MED ORDER — METRONIDAZOLE 250 MG PO TABS: 250.0000 mg | ORAL_TABLET | Freq: Three times a day (TID) | ORAL | Status: AC

## 2011-02-23 NOTE — ED Notes (Signed)
C/o vaginal discharge with odor and itching.  Also reports urinary frequency and "tingling" with urination.  Sx for 4-5 days

## 2011-02-23 NOTE — ED Provider Notes (Signed)
History     CSN: [id number]  Arrival date & time 02/23/11  1132   First MD Initiated Contact with Patient 02/23/11 1314      Chief Complaint  Patient presents with  . Vaginal Discharge    (Consider location/radiation/quality/duration/timing/severity/associated sxs/prior treatment) Patient is a 31 y.o. female presenting with vaginal discharge. The history is provided by the patient.  Vaginal Discharge This is a recurrent problem. The current episode started more than 2 days ago. The problem has not changed since onset.Pertinent negatives include no abdominal pain.    Past Medical History  Diagnosis Date  . Ovarian cyst     Past Surgical History  Procedure Date  . Ovarian cyst removal Mar 25 2010  . Biopsy of right breast   . Breast biopsy 1999    right  . Ovarian cyst removal 03/25/2010    right    Family History  Problem Relation Age of Onset  . Hypertension Mother   . Diabetes Mother   . Cancer Father   . Hypertension Father   . Diabetes Brother   . Hypertension Brother     History  Substance Use Topics  . Smoking status: Current Everyday Smoker -- 0.2 packs/day for 13 years    Types: Cigarettes  . Smokeless tobacco: Never Used  . Alcohol Use: Yes     occas.    OB History    Grav Para Term Preterm Abortions TAB SAB Ect Mult Living   0         0      Review of Systems  Constitutional: Negative.   Gastrointestinal: Negative.  Negative for abdominal pain.  Genitourinary: Positive for vaginal discharge. Negative for dysuria, urgency, frequency, flank pain, vaginal bleeding and vaginal pain.    Allergies  Review of patient's allergies indicates no known allergies.  Home Medications   Current Outpatient Rx  Name Route Sig Dispense Refill  . METRONIDAZOLE 250 MG PO TABS Oral Take 1 tablet (250 mg total) by mouth 3 (three) times daily. 21 tablet 0  . NORGESTIM-ETH ESTRAD TRIPHASIC 0.18/0.215/0.25 MG-35 MCG PO TABS Oral Take 1 tablet by mouth daily. 1  Package 11  . OVER THE COUNTER MEDICATION  Nyquil, tussin, alka seltzer, mucous relief       BP 108/76  Pulse 71  Temp(Src) 97.7 F (36.5 C) (Oral)  Resp 16  SpO2 100%  [name] 02/02/2011  Physical Exam  Nursing note and vitals reviewed. Constitutional: She appears well-developed and well-nourished.  Abdominal: Soft. Bowel sounds are normal. There is no tenderness. There is no rebound and no guarding.  Genitourinary: Uterus normal. Cervix exhibits no motion tenderness. Right adnexum displays no tenderness. Left adnexum displays no tenderness. No erythema, tenderness or bleeding around the vagina. Vaginal discharge found.    ED Course  Procedures (including critical care time)   Labs Reviewed  POCT URINALYSIS DIP (DEVICE)  POCT PREGNANCY, URINE  POCT URINALYSIS DIPSTICK  POCT PREGNANCY, URINE  GC/CHLAMYDIA PROBE AMP, GENITAL  WET PREP, GENITAL   No results found.   1. Vaginitis       MDM  U/a and u preg neg.        Barkley Bruns, MD 02/23/11 1349

## 2011-02-24 NOTE — ED Notes (Signed)
GC/Chlamydia neg., Wet prep: many clue cells, rare WBC's. Pt. adequately treated with Flagyl.  [name] 02/24/2011

## 2011-02-26 ENCOUNTER — Telehealth (HOSPITAL_COMMUNITY): Payer: Self-pay | Admitting: *Deleted

## 2011-03-13 ENCOUNTER — Ambulatory Visit: Payer: Self-pay | Admitting: Obstetrics & Gynecology

## 2011-04-08 ENCOUNTER — Ambulatory Visit: Payer: Self-pay | Admitting: Obstetrics & Gynecology

## 2011-05-01 ENCOUNTER — Encounter: Payer: Self-pay | Admitting: Obstetrics & Gynecology

## 2011-05-01 ENCOUNTER — Ambulatory Visit (INDEPENDENT_AMBULATORY_CARE_PROVIDER_SITE_OTHER): Payer: Self-pay | Admitting: Obstetrics & Gynecology

## 2011-05-01 DIAGNOSIS — N809 Endometriosis, unspecified: Secondary | ICD-10-CM | POA: Insufficient documentation

## 2011-05-01 DIAGNOSIS — G8929 Other chronic pain: Secondary | ICD-10-CM

## 2011-05-01 DIAGNOSIS — N898 Other specified noninflammatory disorders of vagina: Secondary | ICD-10-CM

## 2011-05-01 DIAGNOSIS — N801 Endometriosis of ovary: Secondary | ICD-10-CM

## 2011-05-01 DIAGNOSIS — N949 Unspecified condition associated with female genital organs and menstrual cycle: Secondary | ICD-10-CM

## 2011-05-01 DIAGNOSIS — N644 Mastodynia: Secondary | ICD-10-CM

## 2011-05-01 DIAGNOSIS — N80109 Endometriosis of ovary, unspecified side, unspecified depth: Secondary | ICD-10-CM

## 2011-05-01 MED ORDER — NORGESTIMATE-ETH ESTRADIOL 0.25-35 MG-MCG PO TABS: 1.0000 | ORAL_TABLET | Freq: Every day | ORAL | Status: DC

## 2011-05-01 NOTE — Patient Instructions (Signed)
Endometriosis Endometriosis is a disease that occurs when the endometrium (lining of the uterus) is misplaced outside of its normal location. It may occur in many locations close to the uterus (womb), but commonly on the ovaries, fallopian tubes, vagina (birth canal) and bowel located close to the uterus. Because the uterus sloughs (expels) its lining every month (menses), there is bleeding whereever the endometrial tissue is located. SYMPTOMS  Often there are no symptoms. However, because blood is irritating to tissues not normally exposed to it, when symptoms occur they vary with the location of the misplaced endometrium. Symptoms often include back and abdominal pain. Periods may be heavier and intercourse may be painful. Infertility may be present. You may have all of these symptoms at one time or another or you may have months with no symptoms at all. Although the symptoms occur mainly during menses, they can occur mid-cycle as well, and usually terminate with menopause. DIAGNOSIS  Your caregiver may recommend a blood test and urine test (urinalysis) to help rule out other conditions. Another common test is ultrasound, a painless procedure that uses sound waves to make a sonogram "picture" of abnormal tissue that could be endometriosis. If your bowel movements are painful around your periods, your caregiver may advise a barium enema (an X-ray of the lower bowel), to try to find the source of your pain. This is sometimes confirmed by laparoscopy. Laparoscopy is a procedure where your caregiver looks into your abdomen with a laparoscope (a small pencil sized telescope). Your caregiver may take a tiny piece of tissue (biopsy) from any abnormal tissue to confirm or document your problem. These tissues are sent to the lab and a pathologist looks at them under the microscope to give a microscopic diagnosis. TREATMENT  Once the diagnosis is made, it can be treated by destruction of the misplaced endometrial  tissue using heat (diathermy), laser, cutting (excision), or chemical means. It may also be treated with hormonal therapy. When using hormonal therapy menses are eliminated, therefore eliminating the monthly exposure to blood by the misplaced endometrial tissue. Only in severe cases is it necessary to perform a hysterectomy with removal of the tubes, uterus and ovaries. HOME CARE INSTRUCTIONS   Only take over-the-counter or prescription medicines for pain, discomfort, or fever as directed by your caregiver.   Avoid activities that produce pain, including a physical sexual relationship.   Do not take aspirin as this may increase bleeding when not on hormonal therapy.   See your caregiver for pain or problems not controlled with treatment.  SEEK IMMEDIATE MEDICAL CARE IF:   Your pain is severe and is not responding to pain medicine.   You develop severe nausea and vomiting, or you cannot keep foods down.   Your pain localizes to the right lower part of your abdomen (possible appendicitis).   You have swelling or increasing pain in the abdomen.   You have a fever.   You see blood in your stool.  MAKE SURE YOU:   Understand these instructions.   Will watch your condition.   Will get help right away if you are not doing well or get worse.  Document Released: 01/24/2000 Document Revised: 01/15/2011 Document Reviewed: 09/14/2007 [employer] Patient Information 2012 [employer]. 

## 2011-05-01 NOTE — Progress Notes (Signed)
History:  31 y.o. G0P0 here today for report of worsening chronic pelvic pain in the setting of known endometriosis; had laparoscopic RSO for large endometrioma and LOA by Dr. Debroah Loop in 03/2010.  She was placed on Tri-Sprintec for management but stopped taking it months ago.  Was trying to get pregnant, but was unsuccessful, now wants to restart OCPs because of the pain.  Reports irregular periods associated with dysmenorrhea. Pain is alleviated by NSAIDs. Also reports having small amount of discharge, no associated symptoms.  Patient also reports diffuse, bilateral breast tenderness for two weeks.  No associated lesions, not associated with any part of menstrual cycle.  No alleviating or worsening factors.  The following portions of the patient's history were reviewed and updated as appropriate: allergies, current medications, past family history, past medical history, past social history, past surgical history and problem list.   Objective:  Physical Exam Blood pressure 115/70, pulse 59, temperature 97.3 F (36.3 C), temperature source Oral, height 5\' 2"  (1.575 m), weight 172 lb 1.6 oz (78.064 kg), last menstrual period 04/05/2011. Gen: NAD Breasts: Bilateral dense breasts, diffusely tender, no discrete masses felt but exam limited by breast density. No skin changes, lymphadenopathy, nipple drainage bilaterally. Abd: Soft, nontender and nondistended Pelvic: Normal appearing external genitalia; normal appearing vaginal mucosa and cervix.  White discharge.  Small uterus, no palpable masses.  Mild diffuse tenderness during exam.  Wet prep, GC/Chlam cultures obtained.   Assessment & Plan:  Sprintec e-prescribed for endometriosis; continue NSAIDs as needed.  Repeat pelvic ultrasound also ordered to rule out other structural anomalies/recurrent endometrioma. Bilateral breast ultrasound ordered for evaluation of mastodynia.  Will follow up all results and manage accordingly.

## 2011-05-02 LAB — WET PREP, GENITAL
Trich, Wet Prep: NONE SEEN
Yeast Wet Prep HPF POC: NONE SEEN

## 2011-05-04 ENCOUNTER — Telehealth: Payer: Self-pay | Admitting: Obstetrics and Gynecology

## 2011-05-04 DIAGNOSIS — B9689 Other specified bacterial agents as the cause of diseases classified elsewhere: Secondary | ICD-10-CM

## 2011-05-04 DIAGNOSIS — N760 Acute vaginitis: Secondary | ICD-10-CM

## 2011-05-04 MED ORDER — METRONIDAZOLE 500 MG PO TABS: 500.0000 mg | ORAL_TABLET | Freq: Two times a day (BID) | ORAL | Status: AC

## 2011-05-04 NOTE — Telephone Encounter (Signed)
Pt notified. She will pick up flagyl at pharmacy.

## 2011-05-04 NOTE — Telephone Encounter (Signed)
Patient called in requesting test results.

## 2011-05-04 NOTE — Telephone Encounter (Signed)
Message copied by Mannie Stabile on Mon May 04, 2011 11:30 AM ------      Message from: [name] A      Created: Sun May 03, 2011 10:16 AM       Please call patient with results, wet prep showed moderate clue cells.  Flagyl e-prescribed.

## 2011-05-05 ENCOUNTER — Telehealth: Payer: Self-pay | Admitting: *Deleted

## 2011-05-05 MED ORDER — FLUCONAZOLE 150 MG PO TABS: 150.0000 mg | ORAL_TABLET | Freq: Once | ORAL | Status: AC

## 2011-05-05 NOTE — Telephone Encounter (Signed)
Pt left message stating that she has picked up her Rx and now is having itching. She is requesting Rx for yeast. I returned pt call this morning and told her that I can send in Rx for Diflucan. I recommended that pt try to wait until after her Rx for Flagyl is completed. Pt voiced understanding.

## 2011-05-06 ENCOUNTER — Ambulatory Visit
Admission: RE | Admit: 2011-05-06 | Discharge: 2011-05-06 | Disposition: A | Discharge status: Home / Self Care | Payer: Self-pay | Source: Ambulatory Visit | Attending: Obstetrics & Gynecology | Admitting: Obstetrics & Gynecology

## 2011-05-06 DIAGNOSIS — N644 Mastodynia: Secondary | ICD-10-CM

## 2011-05-07 ENCOUNTER — Telehealth: Payer: Self-pay | Admitting: *Deleted

## 2011-05-07 NOTE — Telephone Encounter (Signed)
Called [name] and informed her of [place] appt needed per order from Dr. Macon Large. [name] requested week of 05/18/11. Appt made for 05/18/11 @ 1000. [name] voiced understanding.

## 2011-05-18 ENCOUNTER — Ambulatory Visit (HOSPITAL_COMMUNITY)
Admission: RE | Admit: 2011-05-18 | Discharge: 2011-05-18 | Disposition: A | Discharge status: Home / Self Care | Payer: Self-pay | Source: Ambulatory Visit | Attending: Obstetrics & Gynecology | Admitting: Obstetrics & Gynecology

## 2011-05-18 DIAGNOSIS — G8929 Other chronic pain: Secondary | ICD-10-CM

## 2011-05-18 DIAGNOSIS — N949 Unspecified condition associated with female genital organs and menstrual cycle: Secondary | ICD-10-CM | POA: Insufficient documentation

## 2011-05-18 DIAGNOSIS — N803 Endometriosis of pelvic peritoneum, unspecified: Secondary | ICD-10-CM | POA: Insufficient documentation

## 2011-05-20 ENCOUNTER — Telehealth: Payer: Self-pay | Admitting: *Deleted

## 2011-05-20 NOTE — Telephone Encounter (Signed)
Pt called wanting test results

## 2011-05-20 NOTE — Telephone Encounter (Signed)
Pt is requesting her ultrasound results. Will forward to Dr. Macon Large to review and advise on what plan of care for patient will be.

## 2011-05-21 ENCOUNTER — Telehealth: Payer: Self-pay | Admitting: Obstetrics and Gynecology

## 2011-05-21 NOTE — Telephone Encounter (Signed)
Patient called back in regard of her u/s results of benign cyst. Advised patient that it is not cancer and that it can be taken out if she wishes to, and that it is what her next appointment is for-- to discuss surgery options with the MD. Patient reports no c/o pain. Patient agrees to all.

## 2011-05-21 NOTE — Telephone Encounter (Signed)
Patient called wanting to ask more questions about her cyst ovaries. No answer when called back; left message to call us back.

## 2011-05-21 NOTE — Telephone Encounter (Signed)
ATTENDING RESULT NOTE  05/18/2011 TRANSABDOMINAL AND TRANSVAGINAL ULTRASOUND OF PELVIS  Clinical Data: Pelvic pain. Endometriosis. Previous right  oophrectomy. Comparison: 03/05/2010  Findings: Uterus: 7.5 x 2.9 x 4.8 cm. No fibroids or other uterine masses are identified. Myometrial indication on the fundal portion of the endometrial cavity is seen measuring 1.2 cm, consistent with a subseptate uterus.  Endometrium: Double layer thickness measures 8 mm transvaginally. Normal appearance.  Right ovary: Surgically absent. A fluid-filled tubular structure  is seen in the right adnexa which measures approximately 2.6 x 6.6 cm. This may represent a hydrosalpinx or peritoneal inclusion cyst.  Left ovary: 4.9 x 2.5 x 2.8 cm. Normal appearance.  Other Findings: No free fluid  IMPRESSION:  1. 2.6 x 6.6 cm tubular cystic structure in right adnexa,  suspicious for hydrosalpinx or peritoneal inclusion cyst.  2. Normal left ovary.  3. Subseptate uterus.  05/06/11 DIGITAL DIAGNOSTIC BILATERAL MAMMOGRAM WITH CAD  Findings: There is a heterogeneously dense parenchymal pattern bilaterally. There is no mass, distortion, or worrisome  calcification within either breast. Mammographic images were processed with CAD.  IMPRESSION: No findings worrisome for malignancy. Breast self-examination was reviewed with the patient. Recommend screening mammography at age 31.  BI-RADS CATEGORY 1: Negative.    Please inform patient she has a benign cyst on her right side, probable remnant of tube or peritoneal cyst.  She needs appointment to discuss surgical management, if she desires. In the meantime, she should continue on OCPs as prescribed, and NSAIDS for pain.  Her mammogram for breast tenderness was negative; please also inform patient of this result.  [name], M.D. 05/21/2011 8:29 AM

## 2011-05-21 NOTE — Telephone Encounter (Signed)
Called patient to review results with her as directed by Dr. Macon Large.  Offered patient appointment to discuss results in detail and surgical options- patient teary- support given. Patient states will call back later if she decides to make appointment

## 2011-05-21 NOTE — Telephone Encounter (Signed)
Will route to Dr. Macon Large to notify her patient was notified and did not make appointment and was teary.

## 2011-05-25 ENCOUNTER — Encounter (HOSPITAL_COMMUNITY): Payer: Self-pay | Admitting: *Deleted

## 2011-05-25 ENCOUNTER — Emergency Department (INDEPENDENT_AMBULATORY_CARE_PROVIDER_SITE_OTHER)
Admission: EM | Admit: 2011-05-25 | Discharge: 2011-05-25 | Disposition: A | Discharge status: Home / Self Care | Payer: Self-pay | Source: Home / Self Care | Attending: Emergency Medicine | Admitting: Emergency Medicine

## 2011-05-25 DIAGNOSIS — I493 Ventricular premature depolarization: Secondary | ICD-10-CM

## 2011-05-25 DIAGNOSIS — I4949 Other premature depolarization: Secondary | ICD-10-CM

## 2011-05-25 MED ORDER — METOPROLOL SUCCINATE ER 25 MG PO TB24: 25.0000 mg | ORAL_TABLET | Freq: Every day | ORAL | Status: DC

## 2011-05-25 NOTE — Discharge Instructions (Signed)
Premature Ventricular Contraction  Premature ventricular contraction (PVC) is an irregularity of the heart rhythm involving extra or skipped heartbeats. In some cases, they may occur without obvious cause or heart disease.  Other times, they can be caused by an electrolyte change in the blood. These need to be corrected. They can also be seen when there is not enough oxygen going to the heart. A common cause of this is plaque or cholesterol buildup. This buildup decreases the blood supply to the heart. In addition, extra beats may be caused or aggravated by:   Excessive smoking.   Alcohol consumption.   Caffeine.   Certain medications   Some street drugs.  SYMPTOMS     The sensation of feeling your heart skipping a beat (palpitations).   In many cases, the person may have no symptoms.  SIGNS AND TESTS     A physical examination may show an occasional irregularity, but if the PVC beats do not happen often, they may not be found on physical exam.   Blood pressure is usually normal.   Other tests that may find extra beats of the heart are:   An EKG (electrocardiogram)   A Holter monitor which can monitor your heart over longer periods of time   An Angiogram (study of the heart arteries).  TREATMENT    Usually extra heartbeats do not need treatment. The condition is treated only if symptoms are severe or if extra beats are very frequent or are causing problems. An underlying cause, if discovered, may also require treatment.    Treatment may also be needed if there may be a risk for other more serious cardiac arrhythmias.    PREVENTION     Moderation in caffeine, alcohol, and tobacco use may reduce the risk of ectopic heartbeats in some people.   Exercise often helps people who lead a sedentary (inactive) lifestyle.  PROGNOSIS    PVC heartbeats are generally harmless and do not need treatment.    RISKS AND COMPLICATIONS     Ventricular tachycardia (occasionally).   There usually are no complications.    Other arrhythmias (occasionally).  SEEK IMMEDIATE MEDICAL CARE IF:     You feel palpitations that are frequent or continual.   You develop chest pain or other problems such as shortness of breath, sweating, or nausea and vomiting.   You become light-headed or faint (pass out).   You get worse or do not improve with treatment.  Document Released: 09/13/2003 Document Revised: 01/15/2011 Document Reviewed: 03/25/2007  Exit[employer] Patient Information 2012 [employer].

## 2011-05-25 NOTE — ED Notes (Signed)
Pt is here with complaints of irregular heart beat with onset yesterday.  Pt states she has had this problem in the past and was seen for this 8 months ago at [facility].

## 2011-05-25 NOTE — ED Provider Notes (Signed)
Chief Complaint  Patient presents with  . Irregular Heart Beat    History of Present Illness:   The patient is a 31 year old female who has had a 2 to three-day history of an irregular heartbeat. She feels like her heart is skipping beats. This occurs intermittently, but all day long and seems to be getting worse. She feels mildly short of breath but denies any coughing or wheezing. She feels like her heart sometimes races or slows down, but she denies any dizziness, presyncope, or syncope. She has no known history of heart disease. She takes birth control pills. She admits to being under stress. She does not use alcohol to excess. She does smoke about a quarter pack of cigarettes per day and would like to quit. She only drinks caffeine moderately. She's not taking any other medications.  Review of Systems:  Other than noted above, the patient denies any of the following symptoms. Systemic:  No fever, chills, sweats, or fatigue. ENT:  No nasal congestion, rhinorrhea, or sore throat. Pulmonary:  No cough, wheezing, shortness of breath, sputum production, hemoptysis. Cardiac:  No palpitations, rapid heartbeat, dizziness, presyncope or syncope. GI:  No abdominal pain, heartburn, nausea, or vomiting. Skin:  No rash or itching. Ext:  No leg pain or swelling.   PMFSH:  Past medical history, family history, social history, meds, and allergies were reviewed and updated as needed.  Physical Exam:   Vital signs:  BP 117/76  Pulse 78  Temp(Src) 99.1 F (37.3 C) (Oral)  Resp 18  SpO2 100%  [name] 05/05/2011 Gen:  Alert, oriented, in no distress, skin warm and dry. Eye:  PERRL, lids and conjunctivas normal.  Sclera non-icteric. ENT:  Mucous membranes moist, pharynx clear. Neck:  Supple, no adenopathy or tenderness.  No JVD. Lungs:  Clear to auscultation, no wheezes, rales or rhonchi.  No respiratory distress. Heart:  Regular rhythm.  No gallops, murmers, clicks or rubs. Chest:  No chest wall  tenderness. Abdomen:  Soft, nontender, no organomegaly or mass.  Bowel sounds normal.  No pulsatile abdominal mass or bruit. Ext:  No edema.  No calf tenderness and Homann's sign negative.  Pulses full and equal. Skin:  Warm and dry.  No rash.   EKG:   Date: 05/25/2011  Rate: 68  Rhythm: sinus arrhythmia with a rare PVC.  QRS Axis: normal  Intervals: normal  ST/T Wave abnormalities: normal  Conduction Disutrbances:none  Narrative Interpretation: Normal sinus rhythm with sinus arrhythmia, normal EKG. She has one PVC on the rhythm strip.  Old EKG Reviewed: none available  Assessment:  The encounter diagnosis was Premature ventricular contraction.  Plan:   1.  The following meds were prescribed:   New Prescriptions   METOPROLOL SUCCINATE (TOPROL-XL) 25 MG 24 HR TABLET    Take 1 tablet (25 mg total) by mouth daily.   2.  The patient was instructed in symptomatic care and handouts were given. 3.  The patient was told to return if becoming worse in any way, if no better in 3 or 4 days, and given some red flag symptoms that would indicate earlier return.  Follow up:  The patient was told to follow up with Dr. Marca Ancona within the next week.     Reuben Likes, MD 05/25/11 2157

## 2011-06-11 ENCOUNTER — Ambulatory Visit (INDEPENDENT_AMBULATORY_CARE_PROVIDER_SITE_OTHER): Payer: Self-pay | Admitting: Obstetrics & Gynecology

## 2011-06-11 ENCOUNTER — Encounter: Payer: Self-pay | Admitting: Obstetrics & Gynecology

## 2011-06-11 VITALS — BP 128/84 | HR 88 | Temp 98.7°F | Ht 62.0 in | Wt 169.0 lb

## 2011-06-11 DIAGNOSIS — R1900 Intra-abdominal and pelvic swelling, mass and lump, unspecified site: Secondary | ICD-10-CM

## 2011-06-11 NOTE — Patient Instructions (Signed)
Endometriosis Endometriosis is a disease that occurs when the endometrium (lining of the uterus) is misplaced outside of its normal location. It may occur in many locations close to the uterus (womb), but commonly on the ovaries, fallopian tubes, vagina (birth canal) and bowel located close to the uterus. Because the uterus sloughs (expels) its lining every month (menses), there is bleeding whereever the endometrial tissue is located. SYMPTOMS  Often there are no symptoms. However, because blood is irritating to tissues not normally exposed to it, when symptoms occur they vary with the location of the misplaced endometrium. Symptoms often include back and abdominal pain. Periods may be heavier and intercourse may be painful. Infertility may be present. You may have all of these symptoms at one time or another or you may have months with no symptoms at all. Although the symptoms occur mainly during menses, they can occur mid-cycle as well, and usually terminate with menopause. DIAGNOSIS  Your caregiver may recommend a blood test and urine test (urinalysis) to help rule out other conditions. Another common test is ultrasound, a painless procedure that uses sound waves to make a sonogram "picture" of abnormal tissue that could be endometriosis. If your bowel movements are painful around your periods, your caregiver may advise a barium enema (an X-ray of the lower bowel), to try to find the source of your pain. This is sometimes confirmed by laparoscopy. Laparoscopy is a procedure where your caregiver looks into your abdomen with a laparoscope (a small pencil sized telescope). Your caregiver may take a tiny piece of tissue (biopsy) from any abnormal tissue to confirm or document your problem. These tissues are sent to the lab and a pathologist looks at them under the microscope to give a microscopic diagnosis. TREATMENT  Once the diagnosis is made, it can be treated by destruction of the misplaced endometrial  tissue using heat (diathermy), laser, cutting (excision), or chemical means. It may also be treated with hormonal therapy. When using hormonal therapy menses are eliminated, therefore eliminating the monthly exposure to blood by the misplaced endometrial tissue. Only in severe cases is it necessary to perform a hysterectomy with removal of the tubes, uterus and ovaries. HOME CARE INSTRUCTIONS   Only take over-the-counter or prescription medicines for pain, discomfort, or fever as directed by your caregiver.   Avoid activities that produce pain, including a physical sexual relationship.   Do not take aspirin as this may increase bleeding when not on hormonal therapy.   See your caregiver for pain or problems not controlled with treatment.  SEEK IMMEDIATE MEDICAL CARE IF:   Your pain is severe and is not responding to pain medicine.   You develop severe nausea and vomiting, or you cannot keep foods down.   Your pain localizes to the right lower part of your abdomen (possible appendicitis).   You have swelling or increasing pain in the abdomen.   You have a fever.   You see blood in your stool.  MAKE SURE YOU:   Understand these instructions.   Will watch your condition.   Will get help right away if you are not doing well or get worse.  Document Released: 01/24/2000 Document Revised: 01/15/2011 Document Reviewed: 09/14/2007 [employer] Patient Information 2012 [employer]. 

## 2011-06-11 NOTE — Progress Notes (Signed)
Patient ID: [name], female   DOB: 02-14-1980, 31 y.o.   MRN: [id number] F/U after [place]. No pain, has not started her Sprintec.   05/18/2011 TRANSABDOMINAL AND TRANSVAGINAL ULTRASOUND OF PELVIS  Clinical Data: Pelvic pain. Endometriosis. Previous right  oophrectomy. Comparison: 03/05/2010  Findings: Uterus: 7.5 x 2.9 x 4.8 cm. No fibroids or other uterine masses are identified. Myometrial indication on the fundal portion of the endometrial cavity is seen measuring 1.2 cm, consistent with a subseptate uterus.  Endometrium: Double layer thickness measures 8 mm transvaginally. Normal appearance.  Right ovary: Surgically absent. A fluid-filled tubular structure  is seen in the right adnexa which measures approximately 2.6 x 6.6 cm. This may represent a hydrosalpinx or peritoneal inclusion cyst.  Left ovary: 4.9 x 2.5 x 2.8 cm. Normal appearance.  Other Findings: No free fluid  IMPRESSION:  1. 2.6 x 6.6 cm tubular cystic structure in right adnexa,  suspicious for hydrosalpinx or peritoneal inclusion cyst.  2. Normal left ovary.  3. Subseptate uterus.    Imp Likely right hydrosalpinx after partial salpingectomy, oophorectomy. Patient counseled at length  Repeat US 67month Start Sprintec  Opie Maclaughlin 06/11/2011 4:15 PM

## 2011-06-16 ENCOUNTER — Encounter: Payer: Self-pay | Admitting: *Deleted

## 2011-06-17 ENCOUNTER — Ambulatory Visit: Payer: Self-pay | Admitting: Cardiology

## 2011-07-22 ENCOUNTER — Emergency Department (INDEPENDENT_AMBULATORY_CARE_PROVIDER_SITE_OTHER)
Admission: EM | Admit: 2011-07-22 | Discharge: 2011-07-22 | Disposition: A | Discharge status: Home / Self Care | Payer: Self-pay | Source: Home / Self Care | Attending: Emergency Medicine | Admitting: Emergency Medicine

## 2011-07-22 ENCOUNTER — Encounter (HOSPITAL_COMMUNITY): Payer: Self-pay | Admitting: *Deleted

## 2011-07-22 DIAGNOSIS — N760 Acute vaginitis: Secondary | ICD-10-CM

## 2011-07-22 DIAGNOSIS — L0291 Cutaneous abscess, unspecified: Secondary | ICD-10-CM

## 2011-07-22 LAB — POCT URINALYSIS DIP (DEVICE)
Bilirubin Urine: NEGATIVE
Ketones, ur: NEGATIVE mg/dL
Leukocytes, UA: NEGATIVE
Protein, ur: NEGATIVE mg/dL
Specific Gravity, Urine: 1.02 (ref 1.005–1.030)
pH: 7 (ref 5.0–8.0)

## 2011-07-22 LAB — WET PREP, GENITAL: Trich, Wet Prep: NONE SEEN

## 2011-07-22 LAB — POCT PREGNANCY, URINE: Preg Test, Ur: NEGATIVE

## 2011-07-22 MED ORDER — METRONIDAZOLE 500 MG PO TABS: 500.0000 mg | ORAL_TABLET | Freq: Two times a day (BID) | ORAL | Status: AC

## 2011-07-22 NOTE — Discharge Instructions (Signed)
Bacterial Vaginosis Bacterial vaginosis (BV) is a vaginal infection where the normal balance of bacteria in the vagina is disrupted. The normal balance is then replaced by an overgrowth of certain bacteria. There are several different kinds of bacteria that can cause BV. BV is the most common vaginal infection in women of childbearing age. CAUSES   The cause of BV is not fully understood. BV develops when there is an increase or imbalance of harmful bacteria.   Some activities or behaviors can upset the normal balance of bacteria in the vagina and put women at increased risk including:   Having a new sex partner or multiple sex partners.   Douching.   Using an intrauterine device (IUD) for contraception.   It is not clear what role sexual activity plays in the development of BV. However, women that have never had sexual intercourse are rarely infected with BV.  Women do not get BV from toilet seats, bedding, swimming pools or from touching objects around them.  SYMPTOMS   Grey vaginal discharge.   A fish-like odor with discharge, especially after sexual intercourse.   Itching or burning of the vagina and vulva.   Burning or pain with urination.   Some women have no signs or symptoms at all.  DIAGNOSIS  Your caregiver must examine the vagina for signs of BV. Your caregiver will perform lab tests and look at the sample of vaginal fluid through a microscope. They will look for bacteria and abnormal cells (clue cells), a pH test higher than 4.5, and a positive amine test all associated with BV.  RISKS AND COMPLICATIONS   Pelvic inflammatory disease (PID).   Infections following gynecology surgery.   Developing HIV.   Developing herpes virus.  TREATMENT  Sometimes BV will clear up without treatment. However, all women with symptoms of BV should be treated to avoid complications, especially if gynecology surgery is planned. Female partners generally do not need to be treated. However,  BV may spread between female sex partners so treatment is helpful in preventing a recurrence of BV.   BV may be treated with antibiotics. The antibiotics come in either pill or vaginal cream forms. Either can be used with nonpregnant or pregnant women, but the recommended dosages differ. These antibiotics are not harmful to the baby.   BV can recur after treatment. If this happens, a second round of antibiotics will often be prescribed.   Treatment is important for pregnant women. If not treated, BV can cause a premature delivery, especially for a pregnant woman who had a premature birth in the past. All pregnant women who have symptoms of BV should be checked and treated.   For chronic reoccurrence of BV, treatment with a type of prescribed gel vaginally twice a week is helpful.  HOME CARE INSTRUCTIONS   Finish all medication as directed by your caregiver.   Do not have sex until treatment is completed.   Tell your sexual partner that you have a vaginal infection. They should see their caregiver and be treated if they have problems, such as a mild rash or itching.   Practice safe sex. Use condoms. Only have 1 sex partner.  PREVENTION  Basic prevention steps can help reduce the risk of upsetting the natural balance of bacteria in the vagina and developing BV:  Do not have sexual intercourse (be abstinent).   Do not douche.   Use all of the medicine prescribed for treatment of BV, even if the signs and symptoms go away.     Tell your sex partner if you have BV. That way, they can be treated, if needed, to prevent reoccurrence.  SEEK MEDICAL CARE IF:   Your symptoms are not improving after 3 days of treatment.   You have increased discharge, pain, or fever.  MAKE SURE YOU:   Understand these instructions.   Will watch your condition.   Will get help right away if you are not doing well or get worse.  FOR MORE INFORMATION  Division of STD Prevention (DSTDP), Centers for Disease  Control and Prevention: www.cdc.gov/std American Social Health Association (ASHA): www.ashastd.org  Document Released: 01/26/2005 Document Revised: 01/15/2011 Document Reviewed: 07/19/2008 ExitCare Patient Information 2012 [employer]. 

## 2011-07-22 NOTE — ED Notes (Signed)
5 days of urinary frequency and dysuria with a  whitish vaginal discharge  She denies fever

## 2011-07-22 NOTE — ED Provider Notes (Addendum)
History     CSN: [id number]  Arrival date & time 07/22/11  1309   First MD Initiated Contact with Patient 07/22/11 1413      Chief Complaint  Patient presents with  . Dysuria    (Consider location/radiation/quality/duration/timing/severity/associated sxs/prior treatment) HPI Comments: For about 5 days been urinating more frequently, and with a white type vaginal discharge, with an odor, No pelvic pain or fevers. The urinary discomfort, is not all the time"  Patient is a 31 y.o. female presenting with dysuria. The history is provided by the patient.  Dysuria  This is a new problem. The current episode started more than 2 days ago (5 days). The problem occurs intermittently. The problem has not changed since onset.The pain is mild. There has been no fever. Associated symptoms include nausea, discharge and urgency. Pertinent negatives include no vomiting and no flank pain. Her past medical history does not include kidney stones or recurrent UTIs.    Past Medical History  Diagnosis Date  . Ovarian cyst   . Viral labyrinthitis   . DENTAL PAIN   . IRREGULAR MENSTRUATION   . FEMALE INFERTILITY   . BOILS, RECURRENT   . SEPTATE UTERUS   . Endometriosis   . Breast tenderness in female     Past Surgical History  Procedure Date  . Ovarian cyst removal Mar 25 2010  . Biopsy of right breast   . Breast biopsy 1999    right  . Ovarian cyst removal 03/25/2010    right    Family History  Problem Relation Age of Onset  . Hypertension Mother   . Diabetes Mother   . Cancer Father   . Hypertension Father   . Diabetes Brother   . Hypertension Brother     History  Substance Use Topics  . Smoking status: Current Everyday Smoker -- 0.2 packs/day for 13 years    Types: Cigarettes  . Smokeless tobacco: Never Used  . Alcohol Use: Yes     occas.    OB History    Grav Para Term Preterm Abortions TAB SAB Ect Mult Living   0         0      Review of Systems  Constitutional:  Negative for fever, activity change, appetite change and fatigue.  Gastrointestinal: Positive for nausea. Negative for vomiting.  Genitourinary: Positive for dysuria and urgency. Negative for flank pain, vaginal discharge, difficulty urinating, genital sores, vaginal pain, menstrual problem and pelvic pain.  Skin: Negative for rash.    Allergies  Review of patient's allergies indicates no known allergies.  Home Medications   Current Outpatient Rx  Name Route Sig Dispense Refill  . METRONIDAZOLE 500 MG PO TABS Oral Take 1 tablet (500 mg total) by mouth 2 (two) times daily. 14 tablet 0  . NORGESTIMATE-ETH ESTRADIOL 0.25-35 MG-MCG PO TABS Oral Take 1 tablet by mouth daily. 1 Package 11    BP 96/65  Pulse 96  Temp 98.4 F (36.9 C) (Oral)  Resp 16  SpO2 100%  [name] 07/03/2011  Physical Exam  Nursing note and vitals reviewed. Constitutional: She appears well-developed and well-nourished.  Abdominal: Soft. There is no tenderness. There is no rebound and no guarding.  Genitourinary: No erythema or tenderness around the vagina. Vaginal discharge found.  Skin: No rash noted.    ED Course  Procedures (including critical care time)  Labs Reviewed  WET PREP, GENITAL - Abnormal; Notable for the following:    Clue Cells Wet Prep HPF  POC MODERATE (*)     WBC, Wet Prep HPF POC FEW (*)     All other components within normal limits  POCT URINALYSIS DIP (DEVICE)  POCT PREGNANCY, URINE  GC/CHLAMYDIA PROBE AMP, GENITAL  HIV ANTIBODY (ROUTINE TESTING)  RPR   No results found.   1. Vaginitis       MDM  Vaginal discharge for 5 days. Requesting HIV screening test, and mild vaginal irritation patient describes she feels its  Related to  BV. Treated today with Flagyl        Jimmie Molly, MD 07/22/11 1610  Jimmie Molly, MD 07/22/11 [id number]

## 2011-07-22 NOTE — ED Notes (Addendum)
GC/Chlamydia pending, Wet prep: Mod. clue cells, few WBC's. Pt. adequately treated with Flagyl. Vassie Moselle 07/22/2011

## 2011-07-23 NOTE — ED Notes (Signed)
GC/Chlamydia neg.  No further action needed. Vassie Moselle 07/23/2011

## 2011-07-27 ENCOUNTER — Emergency Department (INDEPENDENT_AMBULATORY_CARE_PROVIDER_SITE_OTHER)
Admission: EM | Admit: 2011-07-27 | Discharge: 2011-07-27 | Disposition: A | Discharge status: Home / Self Care | Payer: Self-pay | Source: Home / Self Care | Attending: Emergency Medicine | Admitting: Emergency Medicine

## 2011-07-27 ENCOUNTER — Encounter (HOSPITAL_COMMUNITY): Payer: Self-pay | Admitting: *Deleted

## 2011-07-27 DIAGNOSIS — L0291 Cutaneous abscess, unspecified: Secondary | ICD-10-CM

## 2011-07-27 MED ORDER — SULFAMETHOXAZOLE-TMP DS 800-160 MG PO TABS: 2.0000 | ORAL_TABLET | Freq: Two times a day (BID) | ORAL | Status: AC

## 2011-07-27 MED ORDER — HYDROCODONE-ACETAMINOPHEN 5-325 MG PO TABS
1.0000 | ORAL_TABLET | Freq: Once | ORAL | Status: AC
  Administered 2011-07-27: 1 via ORAL

## 2011-07-27 MED ORDER — HYDROCODONE-ACETAMINOPHEN 5-325 MG PO TABS
ORAL_TABLET | ORAL | Status: AC
  Filled 2011-07-27: qty 1

## 2011-07-27 MED ORDER — HYDROCODONE-ACETAMINOPHEN 5-325 MG PO TABS: ORAL_TABLET | ORAL | Status: AC

## 2011-07-27 NOTE — ED Notes (Signed)
Assisted dr.keller with i&d of absess, packing and dressing. Pt denied questions or further needs. Educated on completion of meds/antibiotics. Given water and meds per order. Culture collected by dr/ keller. Father is pt ride home.

## 2011-07-27 NOTE — Discharge Instructions (Signed)

## 2011-07-27 NOTE — ED Provider Notes (Signed)
Chief Complaint  Patient presents with  . Abscess    History of Present Illness:    The patient is a 31 year old female who has had a four-day history of a painful abscess in the right axilla. This has not drained. She denies any fever or chills, but has felt dizzy. She has had abscesses before in her axillas and groin. She denies any history of MRSA or diabetes.  Review of Systems:  Other than noted above, the patient denies any of the following symptoms: Systemic:  No fever, chills or sweats. Skin:  No rash or itching.  PMFSH:  Past medical history, family history, social history, meds, and allergies were reviewed.  No history of diabetes or prior history of abscesses or MRSA.  Physical Exam:   Vital signs:  BP 135/88  Pulse 99  Temp 98.2 F (36.8 C) (Oral)  Resp 18  SpO2 100%  [name] 07/03/2011 Skin:  She has a large, 6 cm abscess in her right axilla which is very tender to touch and fluctuant. Skin exam was otherwise normal.  No rash.  Procedure:  Verbal informed consent was obtained.  The patient was informed of the risks and benefits of the procedure and understands and accepts.  Identity of the patient was verified verbally and by wristband.   The abscess area described above was prepped with Betadine and alcohol and anesthetized with 3 mL of 2% Xylocaine with epinephrine.  Using a #11 scalpel blade, a singe straight incision was made into the area of fluctulence, yielding a large amount of malodorous prurulent drainage.  Routine cultures were obtained.  Blunt dissection was used to break up loculations and the resulting wound cavity was packed with 1/4 inch Iodoform gauze.  A sterile pressure dressing was applied.  Assessment:  The encounter diagnosis was Abscess.  Plan:   1.  The following meds were prescribed:   New Prescriptions   HYDROCODONE-ACETAMINOPHEN (NORCO) 5-325 MG PER TABLET    1 to 2 tabs every 4 to 6 hours as needed for pain.   SULFAMETHOXAZOLE-TRIMETHOPRIM  (BACTRIM DS) 800-160 MG PER TABLET    Take 2 tablets by mouth 2 (two) times daily.   2.  The patient was instructed in symptomatic care and handouts were given. 3.  The patient was instructed to leave the dressing in place and return again in 48 hours for packing removal.   Reuben Likes, MD 07/27/11 1216

## 2011-07-27 NOTE — ED Notes (Signed)
Pt has abscess under right axilla that appeared 4 days ago. Hx of same

## 2011-07-28 ENCOUNTER — Ambulatory Visit: Payer: Self-pay | Admitting: Cardiology

## 2011-07-29 ENCOUNTER — Encounter (HOSPITAL_COMMUNITY): Payer: Self-pay

## 2011-07-29 ENCOUNTER — Emergency Department (HOSPITAL_COMMUNITY)
Admission: EM | Admit: 2011-07-29 | Discharge: 2011-07-29 | Disposition: A | Discharge status: Home / Self Care | Payer: Self-pay | Source: Home / Self Care | Attending: Emergency Medicine | Admitting: Emergency Medicine

## 2011-07-29 DIAGNOSIS — L0390 Cellulitis, unspecified: Secondary | ICD-10-CM

## 2011-07-29 DIAGNOSIS — L0291 Cutaneous abscess, unspecified: Secondary | ICD-10-CM

## 2011-07-29 MED ORDER — DOXYCYCLINE HYCLATE 100 MG PO TABS: 100.0000 mg | ORAL_TABLET | Freq: Two times a day (BID) | ORAL | Status: AC

## 2011-07-29 NOTE — ED Provider Notes (Signed)
Chief Complaint  Patient presents with  . Follow-up    History of Present Illness:    The patient is a 31 year old female who returns today for scheduled followup on an abscess in her right axilla. This was incised and drained 2 days ago yielding a large amount of very foul-smelling pus. Since then the abscesses feel better. She's had no fever or chills. She has left the dressing in place. She's had a moderate amount of drainage on the bandage. She has been on the Septra DS to tablets twice daily since the I&D. Her culture so far is growing out Proteus mirabilis.  Review of Systems:  Other than noted above, the patient denies any of the following symptoms: Systemic:  No fever, chills or sweats. Skin:  No rash or itching.  PMFSH:  Past medical history, family history, social history, meds, and allergies were reviewed.  No history of diabetes or prior history of abscesses or MRSA.  Physical Exam:   Vital signs:  BP 97/58  Pulse 70  Temp 98.6 F (37 C) (Oral)  Resp 18  SpO2 100%  [name] 07/29/2011 Skin:  The bandage was removed. The abscess looks good. There is no induration. She only has minimal pain to palpation.  The packing was removed and the wound cavity looks clean, so will leave out packing for right now.  Skin exam was otherwise normal.  No rash. Ext:  Distal pulses were full, patient has full ROM of all joints.  Procedure:  Verbal informed consent was obtained.  The patient was informed of the risks and benefits of the procedure and understands and accepts.  Identity of the patient was verified verbally and by wristband.   The packing was removed, the area around the abscess was prepped with Betadine, and a clean, dry dressing was reapplied.  Assessment:  The encounter diagnosis was Abscess.  Plan:   1.  The following meds were prescribed:   New Prescriptions   DOXYCYCLINE (VIBRA-TABS) 100 MG TABLET    Take 1 tablet (100 mg total) by mouth 2 (two) times daily.   2.  The patient  was instructed in symptomatic care and handouts were given. 3.  The patient was instructed to leave the dressing in place and return again in 48 hours for packing removal. I suggested that she finished up with me Septra unless we tell her otherwise and then switch over this to the doxycycline once daily as a preventive seen she has had many abscesses in both axillas and in her groin area. I also suggested she may want to switch to a different deodorant since this might be a factor as well.  Reuben Likes, MD 07/29/11 1341

## 2011-07-29 NOTE — Discharge Instructions (Signed)

## 2011-07-29 NOTE — ED Notes (Signed)
Pt seen here 2 days ago for I&D of boil to rt axilla.  Here today for recheck and packing removal.  States taking antibiotics as prescribed.

## 2011-07-30 LAB — CULTURE, ROUTINE-ABSCESS

## 2011-07-30 NOTE — ED Notes (Signed)
Abscess culture R axilla: Abundant Proteus Mirabilis.  Pt. adequately treated with Septra DS. Vassie Moselle 07/30/2011

## 2011-09-14 ENCOUNTER — Emergency Department (INDEPENDENT_AMBULATORY_CARE_PROVIDER_SITE_OTHER)
Admission: EM | Admit: 2011-09-14 | Discharge: 2011-09-14 | Disposition: A | Discharge status: Home / Self Care | Payer: Self-pay | Source: Home / Self Care | Attending: Emergency Medicine | Admitting: Emergency Medicine

## 2011-09-14 ENCOUNTER — Encounter (HOSPITAL_COMMUNITY): Payer: Self-pay | Admitting: *Deleted

## 2011-09-14 DIAGNOSIS — N730 Acute parametritis and pelvic cellulitis: Secondary | ICD-10-CM

## 2011-09-14 LAB — POCT URINALYSIS DIP (DEVICE)
Glucose, UA: NEGATIVE mg/dL
Hgb urine dipstick: NEGATIVE
Ketones, ur: NEGATIVE mg/dL
Specific Gravity, Urine: 1.025 (ref 1.005–1.030)
pH: 6 (ref 5.0–8.0)

## 2011-09-14 MED ORDER — METRONIDAZOLE 500 MG PO TABS: 500.0000 mg | ORAL_TABLET | Freq: Three times a day (TID) | ORAL | Status: AC

## 2011-09-14 MED ORDER — AZITHROMYCIN 250 MG PO TABS
ORAL_TABLET | ORAL | Status: AC
  Filled 2011-09-14: qty 4

## 2011-09-14 MED ORDER — CEFTRIAXONE SODIUM 250 MG IJ SOLR
250.0000 mg | Freq: Once | INTRAMUSCULAR | Status: AC
Start: 2011-09-14 — End: 2011-09-14
  Administered 2011-09-14: 250 mg via INTRAMUSCULAR

## 2011-09-14 MED ORDER — CEFTRIAXONE SODIUM 250 MG IJ SOLR
INTRAMUSCULAR | Status: AC
  Filled 2011-09-14: qty 250

## 2011-09-14 MED ORDER — AZITHROMYCIN 250 MG PO TABS
1000.0000 mg | ORAL_TABLET | Freq: Once | ORAL | Status: AC
  Administered 2011-09-14: 1000 mg via ORAL

## 2011-09-14 NOTE — ED Provider Notes (Signed)
Chief Complaint  Patient presents with  . Vaginal Discharge    History of Present Illness:   [name] is a 31 year old female who presents today with a four-day history of continuous lower abdominal pain which radiates into the upper abdomen. She had broken up with her boyfriend of 12 years and they just got together again right before this episode began. The patient states that the pain seemed to come on a couple days after they had intercourse, so she is concerned about an STD. The pain was worse with subsequent intercourse also with urination. There no specific relieving factors. She tried Advil without much improvement. The pain is described as an ache which is constant rated 7/10 in intensity. It's associated with some vaginal discharge and a fishy odor. She denies any itching. Her menses have been regular. Last menstrual period was July 20. She is sexually active without use of birth control or condoms. She has a history of Chlamydia in the past. She denies any fever, chills, nausea, vomiting, dysuria, frequency, urgency, or hematuria.  Review of Systems:  Other than noted above, the patient denies any of the following symptoms: Systemic:  No fever, chills, sweats, fatigue, or weight loss. GI:  No abdominal pain, nausea, anorexia, vomiting, diarrhea, constipation, melena or hematochezia. GU:  No dysuria, frequency, urgency, hematuria, vaginal discharge, itching, or abnormal vaginal bleeding. Skin:  No rash or itching.   PMFSH:  Past medical history, family history, social history, meds, and allergies were reviewed.  Physical Exam:   Vital signs:  BP 109/60  Pulse 78  Temp 98.5 F (36.9 C) (Oral)  Resp 18  SpO2 100%  [name] 09/02/2011 General:  Alert, oriented and in no distress. Lungs:  Breath sounds clear and equal bilaterally.  No wheezes, rales or rhonchi. Heart:  Regular rhythm.  No gallops or murmers. Abdomen:  Soft, flat and non-distended.  No organomegaly or mass.  No tenderness,  guarding or rebound.  Bowel sounds normally active. Pelvic exam:  External genitalia normal, vaginal and cervical mucosa normal there was no discharge. There was no cervical motion tenderness. Uterus is slightly enlarged and mildly tender to palpation and she has mild bilateral adnexal tenderness to palpation but no mass. Skin:  Clear, warm and dry.  Labs:   Results for orders placed during the hospital encounter of 09/14/11  WET PREP, GENITAL      Component Value Range   Yeast Wet Prep HPF POC NONE SEEN  NONE SEEN   Trich, Wet Prep NONE SEEN  NONE SEEN   Clue Cells Wet Prep HPF POC FEW (*) NONE SEEN   WBC, Wet Prep HPF POC FEW (*) NONE SEEN  POCT URINALYSIS DIP (DEVICE)      Component Value Range   Glucose, UA NEGATIVE  NEGATIVE mg/dL   Bilirubin Urine SMALL (*) NEGATIVE   Ketones, ur NEGATIVE  NEGATIVE mg/dL   Specific Gravity, Urine 1.025  1.005 - 1.030   Hgb urine dipstick NEGATIVE  NEGATIVE   pH 6.0  5.0 - 8.0   Protein, ur NEGATIVE  NEGATIVE mg/dL   Urobilinogen, UA 1.0  0.0 - 1.0 mg/dL   Nitrite NEGATIVE  NEGATIVE   Leukocytes, UA NEGATIVE  NEGATIVE  POCT PREGNANCY, URINE      Component Value Range   Preg Test, Ur NEGATIVE  NEGATIVE    Other Labs Obtained at Urgent Care Center:  GC and Chlamydia DNA probes were obtained.  Results are pending at this time and we will call about  any positive results.  Course in Urgent Care Center:   She was given Rocephin 250 mg IM and azithromycin 1000 mg by mouth and tolerated these well without any immediate side effects.  Assessment:  The encounter diagnosis was PID (acute pelvic inflammatory disease).  Plan:   1.  The following meds were prescribed:   New Prescriptions   METRONIDAZOLE (FLAGYL) 500 MG TABLET    Take 1 tablet (500 mg total) by mouth 3 (three) times daily.   2.  The patient was instructed in symptomatic care and handouts were given. 3.  The patient was told to return if becoming worse in any way, if no better in 3 or 4  days, and given some red flag symptoms that would indicate earlier return. I suggested she return in 48 hours if the pain has not gone away completely. The patient was counseled to avoid intercourse completely for 10 days and then only with condoms.    Reuben Likes, MD 09/14/11 2008

## 2011-09-14 NOTE — ED Notes (Signed)
Pt    Reports      Low  abd  Pain  With  Vaginal      Discharge      Foul  Smelling  Drainage  Pain  After  Intercourse  Symptoms  X     4  Days     She     ambulates  With a  Slow  Steady  Gait          Her  Skin is  Warm  And  Dry

## 2011-09-15 LAB — GC/CHLAMYDIA PROBE AMP, GENITAL: GC Probe Amp, Genital: NEGATIVE

## 2011-09-16 ENCOUNTER — Telehealth (HOSPITAL_COMMUNITY): Payer: Self-pay | Admitting: *Deleted

## 2011-09-16 NOTE — ED Notes (Signed)
I called pt. back and said I could see that she got a Rx. for Flagyl. I asked if she was taking it?  She said yes. I told her she was treated based on her clinical exam and should finish all of her medication. Pt. voiced understanding. [name] 09/16/2011

## 2011-09-16 NOTE — ED Notes (Signed)
Pt. called for her lab results.  Pt. verified x 2 and given results.  GC/Chlamydia neg., Wet prep: few clue cells few WBC's. Pt. asked what about BV? I told her the doctor usually treats mod. or many clue cells but not a few.  She said OK. [name] 09/16/2011

## 2011-10-13 ENCOUNTER — Ambulatory Visit (HOSPITAL_COMMUNITY)
Admission: RE | Admit: 2011-10-13 | Discharge: 2011-10-13 | Disposition: A | Discharge status: Home / Self Care | Payer: Self-pay | Source: Ambulatory Visit | Attending: Obstetrics & Gynecology | Admitting: Obstetrics & Gynecology

## 2011-10-13 ENCOUNTER — Other Ambulatory Visit: Payer: Self-pay | Admitting: Obstetrics & Gynecology

## 2011-10-13 DIAGNOSIS — R1900 Intra-abdominal and pelvic swelling, mass and lump, unspecified site: Secondary | ICD-10-CM

## 2011-10-13 DIAGNOSIS — Q5128 Other doubling of uterus, other specified: Secondary | ICD-10-CM | POA: Insufficient documentation

## 2011-10-13 DIAGNOSIS — N9489 Other specified conditions associated with female genital organs and menstrual cycle: Secondary | ICD-10-CM | POA: Insufficient documentation

## 2011-10-13 DIAGNOSIS — N839 Noninflammatory disorder of ovary, fallopian tube and broad ligament, unspecified: Secondary | ICD-10-CM | POA: Insufficient documentation

## 2011-11-06 ENCOUNTER — Ambulatory Visit (INDEPENDENT_AMBULATORY_CARE_PROVIDER_SITE_OTHER): Payer: Self-pay | Admitting: Obstetrics & Gynecology

## 2011-11-06 ENCOUNTER — Telehealth: Payer: Self-pay | Admitting: *Deleted

## 2011-11-06 ENCOUNTER — Encounter: Payer: Self-pay | Admitting: Obstetrics & Gynecology

## 2011-11-06 VITALS — BP 100/70 | HR 60 | Temp 97.2°F | Resp 12 | Ht 62.0 in | Wt 169.3 lb

## 2011-11-06 DIAGNOSIS — L0293 Carbuncle, unspecified: Secondary | ICD-10-CM

## 2011-11-06 DIAGNOSIS — Z3009 Encounter for other general counseling and advice on contraception: Secondary | ICD-10-CM | POA: Insufficient documentation

## 2011-11-06 DIAGNOSIS — L0292 Furuncle, unspecified: Secondary | ICD-10-CM

## 2011-11-06 MED ORDER — NORGESTIMATE-ETH ESTRADIOL 0.25-35 MG-MCG PO TABS: 1.0000 | ORAL_TABLET | Freq: Every day | ORAL | Status: DC

## 2011-11-06 NOTE — Patient Instructions (Signed)

## 2011-11-06 NOTE — Progress Notes (Signed)
Patient ID: [name], female   DOB: 1981/01/14, 31 y.o.   MRN: [id number] [id number] Patient's last menstrual period was 10/26/2011. Patient had ultrasound 10/23/11. No pain, regular menses, lasts 5 days. Not taking OCP, has prescription.  Current Outpatient Prescriptions on File Prior to Visit  Medication Sig Dispense Refill  . DISCONTD: norgestimate-ethinyl estradiol (ORTHO-CYCLEN,SPRINTEC,PREVIFEM) 0.25-35 MG-MCG tablet Take 1 tablet by mouth daily.  1 Package  11  . DISCONTD: Norgestimate-Ethinyl Estradiol Triphasic (TRI-SPRINTEC) 0.18/0.215/0.25 MG-35 MCG tablet Take 1 tablet by mouth daily.  1 Package  11   No Known Allergies\  Past Surgical History  Procedure Date  . Ovarian cyst removal Mar 25 2010  . Biopsy of right breast   . Breast biopsy 1999    right  . Ovarian cyst removal 03/25/2010    right   *RADIOLOGY REPORT*  Clinical Data: Right adnexal mass. History of right oophorectomy  February 2012. History of pelvic inflammatory disease.  TRANSABDOMINAL AND TRANSVAGINAL ULTRASOUND OF PELVIS  Technique: Both transabdominal and transvaginal ultrasound  examinations of the pelvis were performed. Transabdominal technique  was performed for global imaging of the pelvis including uterus,  ovaries, adnexal regions, and pelvic cul-de-sac.  It was necessary to proceed with endovaginal exam following the  transabdominal exam to visualize the uterus, endometrium, adnexa,  and ovaries.  Comparison: Pelvic ultrasound 05/18/2011  Findings:  Uterus: The uterus measures 7.4 x 3.5 x 5.0 cm. No focal uterine  mass is identified. Approximately 1.2cm extension of the myometrium  into the fundal aspect of the central endometrial cavity is  present, consistent with a subseptate uterus.  Endometrium: Measures 11 mm in thickness.  Right ovary: A 7.0 x 2.6 x 3.7 cm tubular cystic mass in the right  adnexa has similar appearances to the ultrasound of April 2013. The  right ovary is surgically  absent.  Left ovary: Measures 5.4 x 4.5 x 4.6 cm and contains a 3.4 x 3.3 x  2.9 cm complex cystic mass with multiple thin internal lacy  septations.  Other findings: No free fluid.  IMPRESSION:  1. New 3.4 cm complex mass in the left ovary has appearances most  compatible with a hemorrhagic cyst or endometrioma. Short-interval  follow up ultrasound in 6-12 weeks is recommended, preferably  during the week following the patient's normal menses.  2. 7.0 x 2.6 x 3.7 cm tubular cystic structure in the right adnexa  appears similar to prior study and may reflect a hydrosalpinx or  peritoneal inclusion cyst.  3. Subseptate uterus.  Original Report Authenticated By: Britta Mccreedy, M.D.  Imp: Endometriosis. Hydrosalpinx, hemorrhagic ovarian cyst  Plan: Start OCP as Rx RTC 6 mo Repeat US 12 weeks  [name] 11/06/2011 9:00 AM

## 2011-11-06 NOTE — Telephone Encounter (Signed)
Was seen today in clinic , after visit Dr. Debroah Loop requests we schedule a pelvic US in 12 weeks and call patient- he will put in the order

## 2011-11-09 MED ORDER — NORGESTIMATE-ETH ESTRADIOL 0.25-35 MG-MCG PO TABS: 1.0000 | ORAL_TABLET | Freq: Every day | ORAL | Status: DC

## 2011-11-09 NOTE — Telephone Encounter (Signed)
Re-ordered OCPs for patient and sent electronically to [facility] pharmacy on [address]

## 2011-11-09 NOTE — Telephone Encounter (Signed)
Called patient and informed her of the [place] appointment. Patient plans to keep appointment. Patient also asked if her pharmacy was corrected after her last visit. She wants the Rx for OCPs sent to [facility] on [address]

## 2011-11-09 NOTE — Telephone Encounter (Signed)
[place] scheduled for 01/18/12 @ 9:15 am.

## 2011-11-09 NOTE — Addendum Note (Signed)
Addended by: Freddi Starr on: 11/09/2011 01:55 PM   Modules accepted: Orders

## 2011-11-11 ENCOUNTER — Ambulatory Visit: Payer: Self-pay | Admitting: Obstetrics & Gynecology

## 2011-11-13 ENCOUNTER — Telehealth: Payer: Self-pay | Admitting: *Deleted

## 2011-11-13 NOTE — Telephone Encounter (Signed)
Pt called clinic and requested that her Rx for OCP's be sent to [facility]. I informed her that it has been sent there and should be ready for pick up. She also has 1 year's worth of refills. Pt voiced understanding.

## 2012-01-18 ENCOUNTER — Ambulatory Visit (HOSPITAL_COMMUNITY)
Admission: RE | Admit: 2012-01-18 | Discharge: 2012-01-18 | Disposition: A | Discharge status: Home / Self Care | Payer: Self-pay | Source: Ambulatory Visit | Attending: Obstetrics & Gynecology | Admitting: Obstetrics & Gynecology

## 2012-01-18 ENCOUNTER — Ambulatory Visit (INDEPENDENT_AMBULATORY_CARE_PROVIDER_SITE_OTHER): Payer: Self-pay | Admitting: Obstetrics and Gynecology

## 2012-01-18 ENCOUNTER — Ambulatory Visit (HOSPITAL_COMMUNITY): Payer: Self-pay

## 2012-01-18 ENCOUNTER — Encounter: Payer: Self-pay | Admitting: Obstetrics and Gynecology

## 2012-01-18 VITALS — BP 109/76 | HR 86 | Temp 97.4°F | Ht 62.0 in | Wt 170.2 lb

## 2012-01-18 DIAGNOSIS — A499 Bacterial infection, unspecified: Secondary | ICD-10-CM

## 2012-01-18 DIAGNOSIS — D259 Leiomyoma of uterus, unspecified: Secondary | ICD-10-CM | POA: Insufficient documentation

## 2012-01-18 DIAGNOSIS — N760 Acute vaginitis: Secondary | ICD-10-CM

## 2012-01-18 DIAGNOSIS — Z3009 Encounter for other general counseling and advice on contraception: Secondary | ICD-10-CM

## 2012-01-18 DIAGNOSIS — B9689 Other specified bacterial agents as the cause of diseases classified elsewhere: Secondary | ICD-10-CM | POA: Insufficient documentation

## 2012-01-18 DIAGNOSIS — N7013 Chronic salpingitis and oophoritis: Secondary | ICD-10-CM | POA: Insufficient documentation

## 2012-01-18 DIAGNOSIS — N83209 Unspecified ovarian cyst, unspecified side: Secondary | ICD-10-CM | POA: Insufficient documentation

## 2012-01-18 DIAGNOSIS — Q513 Bicornate uterus: Secondary | ICD-10-CM | POA: Insufficient documentation

## 2012-01-18 DIAGNOSIS — Q5128 Other doubling of uterus, other specified: Secondary | ICD-10-CM | POA: Insufficient documentation

## 2012-01-18 NOTE — Progress Notes (Addendum)
Patient ID: [name], female   DOB: 1980-09-03, 31 y.o.   MRN: [id number]  S: [name] is a 31 y.o. G0P0 female who presents today complaining of vaginal discharge for 4-5 days. She reports an odor and that she thinks it is BV because she has a history of recurrent BV infections. She was treated with a longer course of antibiotics at one point and it still came back. Reports a clumpy, whitish, malodorous discharge today. Associated symptoms include lower abdominal/pelvic pain. She denies any urinary symptoms including hematuria, dysuria, increased frequency or urgency. Patient reports getting BV 3-4 times over the past 6 months and would like to try to prevent it from coming back. Denies having unprotected sex, and she does not think it is due to an STD.   O:  Upon speculum exam, a moderate amount of thick, clumpy, white discharge is seen in the vaginal vault and covering the cervical os. Cervix appears pink, no lesions seen on exam.  No obvious vaginal or vulvar erythema or lesions.  Upon bimanual exam, no adnexal tenderness, no cervical motion tenderness, uterus is appropriate size.   Wet prep and GC/Chlamydia collected during speculum exam, results pending.  A: [name] is a 31 y.o. female with white vaginal discharge, suspicious for BV or yeast based on clinical presentation.  P: Results pending, will call in prescription to pharmacy if necessary. Advised patient to get Rephresh vaginal gel (or other pH normalizing gel) in order to maintain an adequate pH and prevent further infections.     Marnee Spring, PA-S2 Evaluation and management procedures were performed by PA-S under my supervision/collaboration. Chart reviewed, patient examined by me and I agree with management and plan.

## 2012-01-18 NOTE — Progress Notes (Signed)
Patient reports white slightly odorous vaginal d/c for 4 days, states it makes her stomach a little crampy- thinks it may be BV because she gets that alot

## 2012-01-21 ENCOUNTER — Telehealth: Payer: Self-pay | Admitting: *Deleted

## 2012-01-21 NOTE — Telephone Encounter (Signed)
Pt left message requesting her test results.

## 2012-01-25 MED ORDER — METRONIDAZOLE 500 MG PO TABS: 500.0000 mg | ORAL_TABLET | Freq: Two times a day (BID) | ORAL | Status: DC

## 2012-01-25 NOTE — Telephone Encounter (Signed)
Called pt and informed her of the following test results;  GC, Chlamydia, Trich and yeast- all negative,  BV- positive.  Rx will be sent in to pt's  Pharmacy today.  Pt voiced understanding.

## 2012-03-04 ENCOUNTER — Encounter (HOSPITAL_COMMUNITY): Payer: Self-pay

## 2012-03-04 ENCOUNTER — Inpatient Hospital Stay (HOSPITAL_COMMUNITY)
Admission: AD | Admit: 2012-03-04 | Discharge: 2012-03-04 | Disposition: A | Discharge status: Home / Self Care | Payer: Self-pay | Source: Ambulatory Visit | Attending: Obstetrics & Gynecology | Admitting: Obstetrics & Gynecology

## 2012-03-04 DIAGNOSIS — N949 Unspecified condition associated with female genital organs and menstrual cycle: Secondary | ICD-10-CM | POA: Insufficient documentation

## 2012-03-04 DIAGNOSIS — K219 Gastro-esophageal reflux disease without esophagitis: Secondary | ICD-10-CM

## 2012-03-04 DIAGNOSIS — A499 Bacterial infection, unspecified: Secondary | ICD-10-CM

## 2012-03-04 DIAGNOSIS — B9689 Other specified bacterial agents as the cause of diseases classified elsewhere: Secondary | ICD-10-CM

## 2012-03-04 DIAGNOSIS — N760 Acute vaginitis: Secondary | ICD-10-CM

## 2012-03-04 DIAGNOSIS — R109 Unspecified abdominal pain: Secondary | ICD-10-CM | POA: Insufficient documentation

## 2012-03-04 LAB — COMPREHENSIVE METABOLIC PANEL
ALT: 11 U/L (ref 0–35)
AST: 14 U/L (ref 0–37)
Albumin: 3.8 g/dL (ref 3.5–5.2)
Alkaline Phosphatase: 61 U/L (ref 39–117)
Glucose, Bld: 84 mg/dL (ref 70–99)
Potassium: 3.8 mEq/L (ref 3.5–5.1)
Sodium: 135 mEq/L (ref 135–145)
Total Protein: 7.2 g/dL (ref 6.0–8.3)

## 2012-03-04 LAB — URINALYSIS, ROUTINE W REFLEX MICROSCOPIC
Glucose, UA: NEGATIVE mg/dL
Hgb urine dipstick: NEGATIVE
Ketones, ur: 15 mg/dL — AB
Protein, ur: NEGATIVE mg/dL

## 2012-03-04 LAB — POCT PREGNANCY, URINE: Preg Test, Ur: NEGATIVE

## 2012-03-04 LAB — WET PREP, GENITAL: Trich, Wet Prep: NONE SEEN

## 2012-03-04 LAB — URINE MICROSCOPIC-ADD ON

## 2012-03-04 LAB — CBC WITH DIFFERENTIAL/PLATELET
Basophils Absolute: 0 10*3/uL (ref 0.0–0.1)
Basophils Relative: 0 % (ref 0–1)
Eosinophils Absolute: 0 10*3/uL (ref 0.0–0.7)
Lymphs Abs: 2.4 10*3/uL (ref 0.7–4.0)
MCH: 28.3 pg (ref 26.0–34.0)
Neutrophils Relative %: 45 % (ref 43–77)
Platelets: 286 10*3/uL (ref 150–400)
RBC: 4.8 MIL/uL (ref 3.87–5.11)
RDW: 14.4 % (ref 11.5–15.5)

## 2012-03-04 MED ORDER — PANTOPRAZOLE SODIUM 20 MG PO TBEC: 20.0000 mg | DELAYED_RELEASE_TABLET | Freq: Every day | ORAL | Status: DC

## 2012-03-04 MED ORDER — METRONIDAZOLE 500 MG PO TABS: 500.0000 mg | ORAL_TABLET | Freq: Two times a day (BID) | ORAL | Status: DC

## 2012-03-04 MED ORDER — GI COCKTAIL ~~LOC~~
30.0000 mL | Freq: Once | ORAL | Status: AC
  Administered 2012-03-04: 30 mL via ORAL
  Filled 2012-03-04: qty 30

## 2012-03-04 NOTE — MAU Note (Signed)
Pt states since Saturday has noted abdominal pain/burning that is constant. Pain is generalized, denies uti s/s, [name]-01/25/2012. Notes white vaginal discharge.

## 2012-03-04 NOTE — MAU Provider Note (Signed)
History     CSN: [id number]  Arrival date and time: 03/04/12 1236   None     Chief Complaint  Patient presents with  . Abdominal Pain   HPI  The patient is a 32 y/o female that presents to the ED with a 1 week history of abdominal pain and burning that has increased in intensity.  The patient states that the pain began suddenly last Saturday in the epigastric region.  The pain has become more diffuse and she has had a limited appetite.  Her fluid intake has decreased.  The patient states that the pain does not radiate.  She has tried naproxen, pepto bismol, tums and prilosec with no relief. The patient states that coughing makes the pain worse.  The patient states normal bowel and bladder fx. The patient denies H/A, SOB, chest pain, rashes, vision changes, or swelling in her neck, extremities, or face.  The patient states that she has not had intercourse in the last week but states she has had a white, odorous discharge that has been present for the last 2 weeks.  She denies any bleeding.  The patient denies any history of STI's. The patient has had an ovarian cyst removed in the past.   OB History    Grav Para Term Preterm Abortions TAB SAB Ect Mult Living   0         0      Past Medical History  Diagnosis Date  . Ovarian cyst   . Viral labyrinthitis   . DENTAL PAIN   . IRREGULAR MENSTRUATION   . FEMALE INFERTILITY   . BOILS, RECURRENT   . SEPTATE UTERUS   . Endometriosis   . Breast tenderness in female     Past Surgical History  Procedure Date  . Ovarian cyst removal Mar 25 2010  . Biopsy of right breast   . Breast biopsy 1999    right  . Ovarian cyst removal 03/25/2010    right    Family History  Problem Relation Age of Onset  . Hypertension Mother   . Diabetes Mother   . Cancer Father   . Hypertension Father   . Diabetes Brother   . Hypertension Brother     History  Substance Use Topics  . Smoking status: Current Every Day Smoker -- 0.5 packs/day for 10  years    Types: Cigarettes  . Smokeless tobacco: Never Used  . Alcohol Use: Yes     Comment: occas.    Allergies: No Known Allergies  No prescriptions prior to admission    Review of Systems  Constitutional: Negative for fever and chills.  Gastrointestinal: Positive for heartburn and abdominal pain. Negative for nausea, vomiting, diarrhea and constipation.  Genitourinary: Negative for dysuria.  Neurological: Negative for headaches.   Physical Exam   Blood pressure 109/68, pulse 87, temperature 98.2 F (36.8 C), temperature source Oral, resp. rate 16, height 5\' 2"  (1.575 m), weight 172 lb 6 oz (78.189 kg), last menstrual period 02/25/2012.  Physical Exam  Constitutional: She is oriented to person, place, and time. She appears well-developed and well-nourished.  HENT:  Head: Normocephalic.  Cardiovascular: Normal rate.   Respiratory: Effort normal.  GI: Soft. She exhibits no distension and no mass. There is tenderness (slight epigastric). There is no rebound and no guarding.  Genitourinary:       Mod amount of pink tinged watery dischrage; cervix clean, NT; uterus NSSC NT; without adnexal tenderness or palpable enlargement  Musculoskeletal: Normal  range of motion.  Neurological: She is alert and oriented to person, place, and time.  Skin: Skin is warm and dry.  Psychiatric: She has a normal mood and affect.     Heart: RRR, no extra sounds noted Lungs: clear and equal breath sounds noted in all lobes ABD: no rashes, masses, or distention noted.  BS + in all 4 quadrants.  Tenderness to palpation in all 4 quadrants including the epigastrium.   PV: 2+ pulses in the extremities without edema Lymp: no adenopathy noted MAU Course  Procedures Results for orders placed during the hospital encounter of 03/04/12 (from the past 24 hour(s))  URINALYSIS, ROUTINE W REFLEX MICROSCOPIC     Status: Abnormal   Collection Time   03/04/12 12:57 PM      Component Value Range   Color, Urine  YELLOW  YELLOW   APPearance CLEAR  CLEAR   Specific Gravity, Urine 1.025  1.005 - 1.030   pH 7.0  5.0 - 8.0   Glucose, UA NEGATIVE  NEGATIVE mg/dL   Hgb urine dipstick NEGATIVE  NEGATIVE   Bilirubin Urine SMALL (*) NEGATIVE   Ketones, ur 15 (*) NEGATIVE mg/dL   Protein, ur NEGATIVE  NEGATIVE mg/dL   Urobilinogen, UA 1.0  0.0 - 1.0 mg/dL   Nitrite NEGATIVE  NEGATIVE   Leukocytes, UA TRACE (*) NEGATIVE  URINE MICROSCOPIC-ADD ON     Status: Abnormal   Collection Time   03/04/12 12:57 PM      Component Value Range   Squamous Epithelial / LPF FEW (*) RARE   WBC, UA 0-2  <3 WBC/hpf   RBC / HPF 0-2  <3 RBC/hpf   Bacteria, UA FEW (*) RARE   Urine-Other MUCOUS PRESENT    POCT PREGNANCY, URINE     Status: Normal   Collection Time   03/04/12  1:05 PM      Component Value Range   Preg Test, Ur NEGATIVE  NEGATIVE  CBC WITH DIFFERENTIAL     Status: Normal   Collection Time   03/04/12  4:01 PM      Component Value Range   WBC 5.3  4.0 - 10.5 K/uL   RBC 4.80  3.87 - 5.11 MIL/uL   Hemoglobin 13.6  12.0 - 15.0 g/dL   HCT 45.4  09.8 - 11.9 %   MCV 84.8  78.0 - 100.0 fL   MCH 28.3  26.0 - 34.0 pg   MCHC 33.4  30.0 - 36.0 g/dL   RDW 14.7  82.9 - 56.2 %   Platelets 286  150 - 400 K/uL   Neutrophils Relative 45  43 - 77 %   Neutro Abs 2.4  1.7 - 7.7 K/uL   Lymphocytes Relative 46  12 - 46 %   Lymphs Abs 2.4  0.7 - 4.0 K/uL   Monocytes Relative 9  3 - 12 %   Monocytes Absolute 0.5  0.1 - 1.0 K/uL   Eosinophils Relative 1  0 - 5 %   Eosinophils Absolute 0.0  0.0 - 0.7 K/uL   Basophils Relative 0  0 - 1 %   Basophils Absolute 0.0  0.0 - 0.1 K/uL  COMPREHENSIVE METABOLIC PANEL     Status: Abnormal   Collection Time   03/04/12  4:01 PM      Component Value Range   Sodium 135  135 - 145 mEq/L   Potassium 3.8  3.5 - 5.1 mEq/L   Chloride 102  96 - 112 mEq/L  CO2 25  19 - 32 mEq/L   Glucose, Bld 84  70 - 99 mg/dL   BUN 11  6 - 23 mg/dL   Creatinine, Ser 9.14  0.50 - 1.10 mg/dL   Calcium 9.1   8.4 - 78.2 mg/dL   Total Protein 7.2  6.0 - 8.3 g/dL   Albumin 3.8  3.5 - 5.2 g/dL   AST 14  0 - 37 U/L   ALT 11  0 - 35 U/L   Alkaline Phosphatase 61  39 - 117 U/L   Total Bilirubin 0.2 (*) 0.3 - 1.2 mg/dL   GFR calc non Af Amer 83 (*) >90 mL/min   GFR calc Af Amer >90  >90 mL/min  WET PREP, GENITAL     Status: Abnormal   Collection Time   03/04/12  5:25 PM      Component Value Range   Yeast Wet Prep HPF POC NONE SEEN  NONE SEEN   Trich, Wet Prep NONE SEEN  NONE SEEN   Clue Cells Wet Prep HPF POC FEW (*) NONE SEEN   WBC, Wet Prep HPF POC FEW (*) NONE SEEN     Assessment and Plan  1.  Abdominal Pain CBC. CMP 2. Vaginal Discharge- BV- prescription for Flagyl 500mg  BID for 7 days Wet prep. GC/Chlam  Evalee Mutton 03/04/2012, 3:55 PM   Seen and agree with above note Will d/c home on Protonix Instructed to go to [facility] practice or Urgent care if not better in 2 weeks, may need GI consult. Discussed reflux/esophagitis S. Chase Picket will do pelvic exam then discharge home Wynelle Bourgeois CNM Pt called and said that she could not afford protonix- will e-scribe Pepcid Pamelia Hoit RNC/WHNP

## 2012-03-05 MED ORDER — FAMOTIDINE 20 MG PO TABS: 20.0000 mg | ORAL_TABLET | Freq: Two times a day (BID) | ORAL | Status: DC

## 2012-03-08 ENCOUNTER — Telehealth (HOSPITAL_COMMUNITY): Payer: Self-pay

## 2012-04-13 ENCOUNTER — Emergency Department (HOSPITAL_COMMUNITY)
Admission: EM | Admit: 2012-04-13 | Discharge: 2012-04-13 | Disposition: A | Discharge status: Home / Self Care | Payer: No Typology Code available for payment source | Attending: Emergency Medicine | Admitting: Emergency Medicine

## 2012-04-13 ENCOUNTER — Encounter (HOSPITAL_COMMUNITY): Payer: Self-pay | Admitting: *Deleted

## 2012-04-13 DIAGNOSIS — Z872 Personal history of diseases of the skin and subcutaneous tissue: Secondary | ICD-10-CM | POA: Insufficient documentation

## 2012-04-13 DIAGNOSIS — J069 Acute upper respiratory infection, unspecified: Secondary | ICD-10-CM

## 2012-04-13 DIAGNOSIS — R059 Cough, unspecified: Secondary | ICD-10-CM | POA: Insufficient documentation

## 2012-04-13 DIAGNOSIS — R05 Cough: Secondary | ICD-10-CM | POA: Insufficient documentation

## 2012-04-13 DIAGNOSIS — Z8742 Personal history of other diseases of the female genital tract: Secondary | ICD-10-CM | POA: Insufficient documentation

## 2012-04-13 DIAGNOSIS — F17200 Nicotine dependence, unspecified, uncomplicated: Secondary | ICD-10-CM | POA: Insufficient documentation

## 2012-04-13 DIAGNOSIS — J029 Acute pharyngitis, unspecified: Secondary | ICD-10-CM | POA: Insufficient documentation

## 2012-04-13 LAB — RAPID STREP SCREEN (MED CTR MEBANE ONLY): Streptococcus, Group A Screen (Direct): NEGATIVE

## 2012-04-13 MED ORDER — HYDROCODONE-HOMATROPINE 5-1.5 MG/5ML PO SYRP: 5.0000 mL | ORAL_SOLUTION | Freq: Four times a day (QID) | ORAL | Status: DC | PRN

## 2012-04-13 MED ORDER — OXYMETAZOLINE HCL 0.05 % NA SOLN: 2.0000 | Freq: Two times a day (BID) | NASAL | Status: DC

## 2012-04-13 MED ORDER — CETIRIZINE-PSEUDOEPHEDRINE ER 5-120 MG PO TB12: 1.0000 | ORAL_TABLET | Freq: Two times a day (BID) | ORAL | Status: DC

## 2012-04-13 NOTE — ED Notes (Signed)
To ED for eval of nasal congestion, right side throat, and right ear pain that started yesterday. States she took a muconex with am which made her stomach "bubbly". Ambulatory without difficulty. Airway patent. Tonsils appear enlarged symmetrically and without exudate. No resp difficulty noted.

## 2012-04-13 NOTE — ED Provider Notes (Signed)
History     CSN: [id number]  Arrival date & time 04/13/12  0811   First MD Initiated Contact with Patient 04/13/12 [phone]      Chief Complaint  Patient presents with  . Nasal Congestion  . Otalgia    (Consider location/radiation/quality/duration/timing/severity/associated sxs/prior treatment) HPI Comments: Patient presents for nasal congestion, R ear pain, sore throat, and productive cough with greenish sputum x 2 days. States sore throat aggravated when swallowing. Denies eye pain/discharge, vision changes, ear discharge, rhinorrhea, inability to swallow, CP, SOB, N/V/D and abdominal pain. Patient denies sick contacts.   Patient is a 32 y.o. female presenting with ear pain. The history is provided by the patient. No language interpreter was used.  Otalgia Location:  Right Behind ear:  No abnormality Severity:  Mild Onset quality:  Gradual Duration:  2 days Timing:  Intermittent Chronicity:  New Associated symptoms: congestion, cough, headaches and sore throat   Associated symptoms: no abdominal pain, no diarrhea, no ear discharge, no fever, no neck pain, no rash, no rhinorrhea, no tinnitus and no vomiting     Past Medical History  Diagnosis Date  . Ovarian cyst   . Viral labyrinthitis   . DENTAL PAIN   . IRREGULAR MENSTRUATION   . FEMALE INFERTILITY   . BOILS, RECURRENT   . SEPTATE UTERUS   . Endometriosis   . Breast tenderness in female     Past Surgical History  Procedure Laterality Date  . Ovarian cyst removal  Mar 25 2010  . Biopsy of right breast    . Breast biopsy  1999    right  . Ovarian cyst removal  03/25/2010    right    Family History  Problem Relation Age of Onset  . Hypertension Mother   . Diabetes Mother   . Cancer Father   . Hypertension Father   . Diabetes Brother   . Hypertension Brother     History  Substance Use Topics  . Smoking status: Current Every Day Smoker -- 0.50 packs/day for 10 years    Types: Cigarettes  . Smokeless  tobacco: Never Used  . Alcohol Use: Yes     Comment: occas.    OB History   Grav Para Term Preterm Abortions TAB SAB Ect Mult Living   0         0      Review of Systems  Constitutional: Negative for fever and chills.  HENT: Positive for ear pain, congestion and sore throat. Negative for rhinorrhea, neck pain, tinnitus and ear discharge.   Eyes: Negative for pain, discharge and visual disturbance.  Respiratory: Positive for cough. Negative for shortness of breath.   Cardiovascular: Negative for chest pain.  Gastrointestinal: Negative for vomiting, abdominal pain and diarrhea.  Skin: Negative for color change and rash.  Neurological: Positive for headaches. Negative for syncope, light-headedness and numbness.  All other systems reviewed and are negative.    Allergies  Review of patient's allergies indicates no known allergies.  Home Medications   Current Outpatient Rx  Name  Route  Sig  Dispense  Refill  . GuaiFENesin (MUCINEX PO)   Oral   Take 20 mLs by mouth 3 (three) times daily as needed (for cold symptoms).         . cetirizine-pseudoephedrine (ZYRTEC-D) 5-120 MG per tablet   Oral   Take 1 tablet by mouth 2 (two) times daily.   30 tablet   0   . HYDROcodone-homatropine (HYCODAN) 5-1.5 MG/5ML syrup  Oral   Take 5 mLs by mouth every 6 (six) hours as needed for cough.   120 mL   0     BP 116/63  Pulse 83  Temp(Src) 98.3 F (36.8 C) (Oral)  Resp 16  Ht 5\' 2"  (1.575 m)  Wt 170 lb (77.111 kg)  BMI 31.09 kg/m2  SpO2 100%  Physical Exam  Nursing note and vitals reviewed. Constitutional: She is oriented to person, place, and time. She appears well-developed and well-nourished. No distress.  HENT:  Head: Normocephalic and atraumatic.  Right Ear: External ear and ear canal normal. No drainage or swelling. Tympanic membrane is not perforated, not erythematous, not retracted and not bulging.  Left Ear: Tympanic membrane, external ear and ear canal normal. No  drainage or swelling. Tympanic membrane is not perforated, not erythematous, not retracted and not bulging.  Nose: Nose normal.  Mouth/Throat: No oropharyngeal exudate.  + fluid visualized in R middle ear; + b/l tonsillar enlargement without exudate  Eyes: Conjunctivae are normal. Pupils are equal, round, and reactive to light. Right eye exhibits no discharge. Left eye exhibits no discharge. No scleral icterus.  Neck: Normal range of motion. Neck supple.  Cardiovascular: Normal rate, regular rhythm, normal heart sounds and intact distal pulses.   Pulmonary/Chest: Effort normal and breath sounds normal. No respiratory distress. She has no wheezes. She has no rales.  Abdominal: Soft. She exhibits no distension. There is no tenderness. There is no rebound and no guarding.  Musculoskeletal: Normal range of motion. She exhibits no edema.  Lymphadenopathy:    She has cervical adenopathy.  Neurological: She is alert and oriented to person, place, and time.  Skin: Skin is warm and dry. No rash noted. She is not diaphoretic. No erythema.  Psychiatric: She has a normal mood and affect. Her behavior is normal.    ED Course  Procedures (including critical care time)  Labs Reviewed  RAPID STREP SCREEN   No results found.   1. Viral URI with cough   2. Pharyngitis, acute      MDM  VERLAINE EMBRY is a 32 y.o. female who presents for nasal congestion, R ear pain, sore throat, and productive cough with greenish sputum x 2 days. Rapid strep screen negative. Symptoms consistent with uncomplicated viral URI. Patient to d/c home with Zyrtec-D and hycodan for symptom relief as well as PCP follow up. Given indications for ED return should symptoms worsen.  Filed Vitals:   04/13/12 0832 04/13/12 0833  BP: 116/63   Pulse: 83   Temp: 98.3 F (36.8 C)   TempSrc: Oral   Resp: 16   Height:  5\' 2"  (1.575 m)  Weight:  170 lb (77.111 kg)  SpO2: 100%            Antony Madura, PA-C 04/13/12 1750

## 2012-04-18 NOTE — ED Provider Notes (Signed)
Medical screening examination/treatment/procedure(s) were performed by non-physician practitioner and as supervising physician I was immediately available for consultation/collaboration.   Kevin E Steinl, MD 04/18/12 0737 

## 2012-06-01 ENCOUNTER — Encounter (HOSPITAL_COMMUNITY): Payer: Self-pay

## 2012-06-01 ENCOUNTER — Other Ambulatory Visit (HOSPITAL_COMMUNITY)
Admission: RE | Admit: 2012-06-01 | Discharge: 2012-06-01 | Disposition: A | Discharge status: Home / Self Care | Payer: No Typology Code available for payment source | Source: Ambulatory Visit | Attending: Family Medicine | Admitting: Family Medicine

## 2012-06-01 ENCOUNTER — Emergency Department (INDEPENDENT_AMBULATORY_CARE_PROVIDER_SITE_OTHER)
Admission: EM | Admit: 2012-06-01 | Discharge: 2012-06-01 | Disposition: A | Discharge status: Home / Self Care | Payer: No Typology Code available for payment source | Source: Home / Self Care | Attending: Family Medicine | Admitting: Family Medicine

## 2012-06-01 DIAGNOSIS — N760 Acute vaginitis: Secondary | ICD-10-CM | POA: Insufficient documentation

## 2012-06-01 DIAGNOSIS — B9689 Other specified bacterial agents as the cause of diseases classified elsewhere: Secondary | ICD-10-CM

## 2012-06-01 DIAGNOSIS — A499 Bacterial infection, unspecified: Secondary | ICD-10-CM

## 2012-06-01 DIAGNOSIS — Z113 Encounter for screening for infections with a predominantly sexual mode of transmission: Secondary | ICD-10-CM | POA: Insufficient documentation

## 2012-06-01 LAB — POCT URINALYSIS DIP (DEVICE)
Bilirubin Urine: NEGATIVE
Glucose, UA: NEGATIVE mg/dL
Ketones, ur: NEGATIVE mg/dL
Leukocytes, UA: NEGATIVE
Protein, ur: 30 mg/dL — AB
Specific Gravity, Urine: 1.02 (ref 1.005–1.030)

## 2012-06-01 LAB — POCT PREGNANCY, URINE: Preg Test, Ur: NEGATIVE

## 2012-06-01 MED ORDER — METRONIDAZOLE 0.75 % VA GEL: 1.0000 | VAGINAL | Status: DC

## 2012-06-01 MED ORDER — METRONIDAZOLE 500 MG PO TABS: 500.0000 mg | ORAL_TABLET | Freq: Two times a day (BID) | ORAL | Status: DC

## 2012-06-01 NOTE — ED Provider Notes (Signed)
History     CSN: [id number]  Arrival date & time 06/01/12  1600   First MD Initiated Contact with Patient 06/01/12 1604      No chief complaint on file.   (Consider location/radiation/quality/duration/timing/severity/associated sxs/prior treatment) Patient is a 32 y.o. female presenting with vaginal discharge. The history is provided by the patient.  Vaginal Discharge This is a recurrent (h/o bv mult times.) problem. The current episode started more than 2 days ago. The problem has been gradually worsening.    Past Medical History  Diagnosis Date  . Ovarian cyst   . Viral labyrinthitis   . DENTAL PAIN   . IRREGULAR MENSTRUATION   . FEMALE INFERTILITY   . BOILS, RECURRENT   . SEPTATE UTERUS   . Endometriosis   . Breast tenderness in female     Past Surgical History  Procedure Laterality Date  . Ovarian cyst removal  Mar 25 2010  . Biopsy of right breast    . Breast biopsy  1999    right  . Ovarian cyst removal  03/25/2010    right    Family History  Problem Relation Age of Onset  . Hypertension Mother   . Diabetes Mother   . Cancer Father   . Hypertension Father   . Diabetes Brother   . Hypertension Brother     History  Substance Use Topics  . Smoking status: Current Every Day Smoker -- 0.50 packs/day for 10 years    Types: Cigarettes  . Smokeless tobacco: Never Used  . Alcohol Use: Yes     Comment: occas.    OB History   Grav Para Term Preterm Abortions TAB SAB Ect Mult Living   0         0      Review of Systems  Constitutional: Negative.   Genitourinary: Positive for vaginal discharge. Negative for urgency, decreased urine volume, vaginal bleeding, menstrual problem and pelvic pain.    Allergies  Review of patient's allergies indicates no known allergies.  Home Medications   Current Outpatient Rx  Name  Route  Sig  Dispense  Refill  . cetirizine-pseudoephedrine (ZYRTEC-D) 5-120 MG per tablet   Oral   Take 1 tablet by mouth 2 (two) times  daily.   30 tablet   0   . GuaiFENesin (MUCINEX PO)   Oral   Take 20 mLs by mouth 3 (three) times daily as needed (for cold symptoms).         Marland Kitchen HYDROcodone-homatropine (HYCODAN) 5-1.5 MG/5ML syrup   Oral   Take 5 mLs by mouth every 6 (six) hours as needed for cough.   120 mL   0     There were no vitals taken for this visit.  Physical Exam  Nursing note and vitals reviewed. Constitutional: She is oriented to person, place, and time. She appears well-developed and well-nourished.  Abdominal: Soft. Bowel sounds are normal. She exhibits no distension and no mass. There is no tenderness. There is no rebound and no guarding.  Genitourinary: Vagina normal and uterus normal. No vaginal discharge found.  Mild vag odor present.  Neurological: She is alert and oriented to person, place, and time.  Skin: Skin is warm and dry.    ED Course  Procedures (including critical care time)  Labs Reviewed - No data to display No results found.   No diagnosis found.    MDM          Linna Hoff, MD 06/01/12 Rickey Primus

## 2012-06-01 NOTE — ED Notes (Signed)
frequent vaginal infections; fishy odor and d/c; NAD

## 2012-06-03 NOTE — ED Notes (Signed)
GC/Chlamydia neg., Affirm: Gardnerella pos., Candida and Trich neg.  Pt. adequately treated with Flagyl. Kitt Ledet M 06/03/2012  

## 2012-06-14 ENCOUNTER — Inpatient Hospital Stay (HOSPITAL_COMMUNITY)
Admission: AD | Admit: 2012-06-14 | Discharge: 2012-06-14 | Disposition: A | Discharge status: Home / Self Care | Payer: No Typology Code available for payment source | Source: Ambulatory Visit | Attending: Obstetrics & Gynecology | Admitting: Obstetrics & Gynecology

## 2012-06-14 ENCOUNTER — Encounter (HOSPITAL_COMMUNITY): Payer: Self-pay

## 2012-06-14 DIAGNOSIS — M549 Dorsalgia, unspecified: Secondary | ICD-10-CM | POA: Insufficient documentation

## 2012-06-14 DIAGNOSIS — N760 Acute vaginitis: Secondary | ICD-10-CM | POA: Insufficient documentation

## 2012-06-14 DIAGNOSIS — A499 Bacterial infection, unspecified: Secondary | ICD-10-CM

## 2012-06-14 DIAGNOSIS — B9689 Other specified bacterial agents as the cause of diseases classified elsewhere: Secondary | ICD-10-CM | POA: Insufficient documentation

## 2012-06-14 DIAGNOSIS — N949 Unspecified condition associated with female genital organs and menstrual cycle: Secondary | ICD-10-CM | POA: Insufficient documentation

## 2012-06-14 DIAGNOSIS — R109 Unspecified abdominal pain: Secondary | ICD-10-CM | POA: Insufficient documentation

## 2012-06-14 LAB — URINALYSIS, ROUTINE W REFLEX MICROSCOPIC
Bilirubin Urine: NEGATIVE
Leukocytes, UA: NEGATIVE
Nitrite: NEGATIVE
Specific Gravity, Urine: 1.025 (ref 1.005–1.030)
Urobilinogen, UA: 0.2 mg/dL (ref 0.0–1.0)
pH: 6 (ref 5.0–8.0)

## 2012-06-14 LAB — WET PREP, GENITAL: Yeast Wet Prep HPF POC: NONE SEEN

## 2012-06-14 MED ORDER — METRONIDAZOLE 500 MG PO TABS: 500.0000 mg | ORAL_TABLET | Freq: Two times a day (BID) | ORAL | Status: DC

## 2012-06-14 NOTE — MAU Provider Note (Signed)

## 2012-06-14 NOTE — MAU Note (Signed)
Pt states vaginal discharge noted x3-4 days, white, mild odor. Also having back and abd pain for same time period. Back/abd pain is constant. Denies burning with voiding. No itching. lmp-05/22/2012

## 2012-06-14 NOTE — MAU Provider Note (Signed)
History     CSN: [id number]  Arrival date and time: 06/14/12 1153   First Provider Initiated Contact with Patient 06/14/12 1251      Chief Complaint  Patient presents with  . Vaginal Discharge  . Abdominal Pain  . Back Pain   HPI [name] is a 32 y.o. G0P0 who presents to MAU today with complaint of vaginal discharge. The patient states that she has had a thick, white discharge x 3-4 days. She has also had cramping associated with the discharge that she rates at 5/10 now. She took advil yesterday which did not provide much relief. She denies bleeding, fever or UTI symptoms. She has occasional nausea without vomiting. She is sexually active with one partner and uses condoms, although the condom broke last week. She is not on any thing for birth control.   OB History   Grav Para Term Preterm Abortions TAB SAB Ect Mult Living   0         0      Past Medical History  Diagnosis Date  . Ovarian cyst   . Viral labyrinthitis   . DENTAL PAIN   . IRREGULAR MENSTRUATION   . FEMALE INFERTILITY   . BOILS, RECURRENT   . SEPTATE UTERUS   . Endometriosis   . Breast tenderness in female     Past Surgical History  Procedure Laterality Date  . Ovarian cyst removal  Mar 25 2010  . Biopsy of right breast    . Breast biopsy  1999    right  . Ovarian cyst removal  03/25/2010    right    Family History  Problem Relation Age of Onset  . Hypertension Mother   . Diabetes Mother   . Cancer Father   . Hypertension Father   . Diabetes Brother   . Hypertension Brother     History  Substance Use Topics  . Smoking status: Current Every Day Smoker -- 0.50 packs/day for 10 years    Types: Cigarettes  . Smokeless tobacco: Never Used  . Alcohol Use: Yes     Comment: occas.    Allergies: No Known Allergies  No prescriptions prior to admission    Review of Systems  Constitutional: Negative for fever and malaise/fatigue.  Gastrointestinal: Positive for nausea and abdominal  pain. Negative for vomiting, diarrhea and constipation.  Genitourinary: Negative for dysuria, urgency and frequency.       + vaginal discharge Neg - vaginal bleeding  Musculoskeletal: Positive for back pain.   Physical Exam   Blood pressure 120/78, pulse 77, temperature 97.9 F (36.6 C), temperature source Oral, resp. rate 18, last menstrual period 05/22/2012.  Physical Exam  Constitutional: She is oriented to person, place, and time. She appears well-developed and well-nourished. No distress.  HENT:  Head: Normocephalic and atraumatic.  Cardiovascular: Normal rate, regular rhythm and normal heart sounds.   Respiratory: Effort normal and breath sounds normal. No respiratory distress.  GI: Soft. Bowel sounds are normal. She exhibits no distension and no mass. There is tenderness (mild tenderness in the lower abdomen). There is no rebound and no guarding.  Genitourinary: Uterus is not enlarged and not tender. Cervix exhibits no motion tenderness, no discharge and no friability. Right adnexum displays no mass and no tenderness. Left adnexum displays no mass and no tenderness. No bleeding around the vagina. Vaginal discharge (moderate amount of thin, white discharge noted) found.  Neurological: She is alert and oriented to person, place, and time.  Skin: Skin is warm and dry. No erythema.  Psychiatric: She has a normal mood and affect.   Results for orders placed during the hospital encounter of 06/14/12 (from the past 24 hour(s))  URINALYSIS, ROUTINE W REFLEX MICROSCOPIC     Status: None   Collection Time    06/14/12 12:01 PM      Result Value Range   Color, Urine YELLOW  YELLOW   APPearance CLEAR  CLEAR   Specific Gravity, Urine 1.025  1.005 - 1.030   pH 6.0  5.0 - 8.0   Glucose, UA NEGATIVE  NEGATIVE mg/dL   Hgb urine dipstick NEGATIVE  NEGATIVE   Bilirubin Urine NEGATIVE  NEGATIVE   Ketones, ur NEGATIVE  NEGATIVE mg/dL   Protein, ur NEGATIVE  NEGATIVE mg/dL   Urobilinogen, UA 0.2   0.0 - 1.0 mg/dL   Nitrite NEGATIVE  NEGATIVE   Leukocytes, UA NEGATIVE  NEGATIVE  POCT PREGNANCY, URINE     Status: None   Collection Time    06/14/12 12:11 PM      Result Value Range   Preg Test, Ur NEGATIVE  NEGATIVE  WET PREP, GENITAL     Status: Abnormal   Collection Time    06/14/12 12:59 PM      Result Value Range   Yeast Wet Prep HPF POC NONE SEEN  NONE SEEN   Trich, Wet Prep NONE SEEN  NONE SEEN   Clue Cells Wet Prep HPF POC MODERATE (*) NONE SEEN   WBC, Wet Prep HPF POC FEW (*) NONE SEEN    MAU Course  Procedures None  MDM Wet prep, GC/Chlamydia today  Assessment and Plan  A: Bacterial vaginosis  P: Discharge home Rx for Flagyl sent to patient's pharmacy Discussed hygiene products and probiotics for avoiding recurrence of BV Patient encouraged to follow-up in [facility] clinic as scheduled to establish GYN care Patient may return to MAU as needed or if her condition were to change or worsen  Freddi Starr, PA-C  06/14/2012, 2:27 PM

## 2012-06-15 LAB — GC/CHLAMYDIA PROBE AMP
CT Probe RNA: NEGATIVE
GC Probe RNA: NEGATIVE

## 2012-06-16 NOTE — MAU Note (Signed)
Pt called in wanting test results

## 2012-06-21 ENCOUNTER — Telehealth: Payer: Self-pay | Admitting: *Deleted

## 2012-06-21 DIAGNOSIS — N83209 Unspecified ovarian cyst, unspecified side: Secondary | ICD-10-CM

## 2012-06-21 NOTE — Telephone Encounter (Addendum)
Pt called clinic with questions regarding need for 6 month follow up of ovarian cyst. She initially spoke w/Vanessa and was given appt with Dr. Debroah Loop for tomorrow @ [phone]. Upon review of pt's chart and last US done 01/18/12, it appears that pt may need follow up US prior to visit w/MD. I called pt and informed her of this possibility and that her clinic appt will most likely be changed. I stated that I will contact Dr. Debroah Loop and then call her back with a definitive answer. Pt voiced understanding.  5/14  1320- Called pt after response received from Dr. Debroah Loop. I informed her that Dr. Debroah Loop would like her to have ultrasound prior to her follow up visit. I scheduled [place] on 5/28 @ 1300 and then she will see Dr. Debroah Loop in clinic afterwards.  Pt agreed and voiced understanding.

## 2012-06-22 ENCOUNTER — Ambulatory Visit: Payer: Self-pay | Admitting: Obstetrics & Gynecology

## 2012-06-27 ENCOUNTER — Ambulatory Visit (HOSPITAL_COMMUNITY): Payer: No Typology Code available for payment source

## 2012-07-06 ENCOUNTER — Ambulatory Visit (HOSPITAL_COMMUNITY)
Admission: RE | Admit: 2012-07-06 | Discharge: 2012-07-06 | Disposition: A | Discharge status: Home / Self Care | Payer: No Typology Code available for payment source | Source: Ambulatory Visit | Attending: Obstetrics & Gynecology | Admitting: Obstetrics & Gynecology

## 2012-07-06 ENCOUNTER — Encounter: Payer: Self-pay | Admitting: Obstetrics & Gynecology

## 2012-07-06 ENCOUNTER — Ambulatory Visit (INDEPENDENT_AMBULATORY_CARE_PROVIDER_SITE_OTHER): Payer: No Typology Code available for payment source | Admitting: Obstetrics & Gynecology

## 2012-07-06 VITALS — BP 113/80 | HR 91 | Temp 98.2°F | Resp 20 | Ht 62.0 in | Wt 170.5 lb

## 2012-07-06 DIAGNOSIS — N7013 Chronic salpingitis and oophoritis: Secondary | ICD-10-CM | POA: Insufficient documentation

## 2012-07-06 DIAGNOSIS — N803 Endometriosis of pelvic peritoneum, unspecified: Secondary | ICD-10-CM | POA: Insufficient documentation

## 2012-07-06 DIAGNOSIS — Z113 Encounter for screening for infections with a predominantly sexual mode of transmission: Secondary | ICD-10-CM

## 2012-07-06 DIAGNOSIS — N739 Female pelvic inflammatory disease, unspecified: Secondary | ICD-10-CM | POA: Insufficient documentation

## 2012-07-06 DIAGNOSIS — A499 Bacterial infection, unspecified: Secondary | ICD-10-CM

## 2012-07-06 DIAGNOSIS — N83209 Unspecified ovarian cyst, unspecified side: Secondary | ICD-10-CM

## 2012-07-06 DIAGNOSIS — N9089 Other specified noninflammatory disorders of vulva and perineum: Secondary | ICD-10-CM | POA: Insufficient documentation

## 2012-07-06 DIAGNOSIS — Q5128 Other doubling of uterus, other specified: Secondary | ICD-10-CM | POA: Insufficient documentation

## 2012-07-06 DIAGNOSIS — Z0000 Encounter for general adult medical examination without abnormal findings: Secondary | ICD-10-CM | POA: Insufficient documentation

## 2012-07-06 DIAGNOSIS — N760 Acute vaginitis: Secondary | ICD-10-CM

## 2012-07-06 DIAGNOSIS — D250 Submucous leiomyoma of uterus: Secondary | ICD-10-CM | POA: Insufficient documentation

## 2012-07-06 DIAGNOSIS — B9689 Other specified bacterial agents as the cause of diseases classified elsewhere: Secondary | ICD-10-CM

## 2012-07-06 MED ORDER — CLINDAMYCIN HCL 150 MG PO CAPS: 150.0000 mg | ORAL_CAPSULE | Freq: Three times a day (TID) | ORAL | Status: DC

## 2012-07-06 MED ORDER — DICLOFENAC SODIUM 75 MG PO TBEC: 75.0000 mg | DELAYED_RELEASE_TABLET | Freq: Two times a day (BID) | ORAL | Status: DC

## 2012-07-06 NOTE — Progress Notes (Signed)
Pt returns for follow up of ovarian cyst- had [place] today.  Also she c/o vag d/c with fishy odor. She requests testing fore HIV and syphilis today.

## 2012-07-06 NOTE — Patient Instructions (Signed)
Bacterial Vaginosis Bacterial vaginosis (BV) is a vaginal infection where the normal balance of bacteria in the vagina is disrupted. The normal balance is then replaced by an overgrowth of certain bacteria. There are several different kinds of bacteria that can cause BV. BV is the most common vaginal infection in women of childbearing age. CAUSES   The cause of BV is not fully understood. BV develops when there is an increase or imbalance of harmful bacteria.  Some activities or behaviors can upset the normal balance of bacteria in the vagina and put women at increased risk including:  Having a new sex partner or multiple sex partners.  Douching.  Using an intrauterine device (IUD) for contraception.  It is not clear what role sexual activity plays in the development of BV. However, women that have never had sexual intercourse are rarely infected with BV. Women do not get BV from toilet seats, bedding, swimming pools or from touching objects around them.  SYMPTOMS   Grey vaginal discharge.  A fish-like odor with discharge, especially after sexual intercourse.  Itching or burning of the vagina and vulva.  Burning or pain with urination.  Some women have no signs or symptoms at all. DIAGNOSIS  Your caregiver must examine the vagina for signs of BV. Your caregiver will perform lab tests and look at the sample of vaginal fluid through a microscope. They will look for bacteria and abnormal cells (clue cells), a pH test higher than 4.5, and a positive amine test all associated with BV.  RISKS AND COMPLICATIONS   Pelvic inflammatory disease (PID).  Infections following gynecology surgery.  Developing HIV.  Developing herpes virus. TREATMENT  Sometimes BV will clear up without treatment. However, all women with symptoms of BV should be treated to avoid complications, especially if gynecology surgery is planned. Female partners generally do not need to be treated. However, BV may spread  between female sex partners so treatment is helpful in preventing a recurrence of BV.   BV may be treated with antibiotics. The antibiotics come in either pill or vaginal cream forms. Either can be used with nonpregnant or pregnant women, but the recommended dosages differ. These antibiotics are not harmful to the baby.  BV can recur after treatment. If this happens, a second round of antibiotics will often be prescribed.  Treatment is important for pregnant women. If not treated, BV can cause a premature delivery, especially for a pregnant woman who had a premature birth in the past. All pregnant women who have symptoms of BV should be checked and treated.  For chronic reoccurrence of BV, treatment with a type of prescribed gel vaginally twice a week is helpful. HOME CARE INSTRUCTIONS   Finish all medication as directed by your caregiver.  Do not have sex until treatment is completed.  Tell your sexual partner that you have a vaginal infection. They should see their caregiver and be treated if they have problems, such as a mild rash or itching.  Practice safe sex. Use condoms. Only have 1 sex partner. PREVENTION  Basic prevention steps can help reduce the risk of upsetting the natural balance of bacteria in the vagina and developing BV:  Do not have sexual intercourse (be abstinent).  Do not douche.  Use all of the medicine prescribed for treatment of BV, even if the signs and symptoms go away.  Tell your sex partner if you have BV. That way, they can be treated, if needed, to prevent reoccurrence. SEEK MEDICAL CARE IF:     Your symptoms are not improving after 3 days of treatment.  You have increased discharge, pain, or fever. MAKE SURE YOU:   Understand these instructions.  Will watch your condition.  Will get help right away if you are not doing well or get worse. FOR MORE INFORMATION  Division of STD Prevention (DSTDP), Centers for Disease Control and Prevention:  www.cdc.gov/std American [employer] Association (ASHA): www.ashastd.org  Document Released: 01/26/2005 Document Revised: 04/20/2011 Document Reviewed: 07/19/2008 Exit[employer] Patient Information 2014 [employer].  

## 2012-07-06 NOTE — Progress Notes (Signed)
Patient ID: [name], female   DOB: Aug 31, 1980, 32 y.o.   MRN: [id number]  Chief Complaint  Patient presents with  . Follow-up    [place] results- OV cyst    HPI [name] is a 32 y.o. female.  Patient's last menstrual period was 06/21/2012. G0P0 H/o endometriosis and [place] was done today in f/u for left ovarian cyst. She has had BV treated with Flagyl and Sx persist.  HPI  Past Medical History  Diagnosis Date  . Ovarian cyst   . Viral labyrinthitis   . DENTAL PAIN   . IRREGULAR MENSTRUATION   . FEMALE INFERTILITY   . BOILS, RECURRENT   . SEPTATE UTERUS   . Endometriosis   . Breast tenderness in female     Past Surgical History  Procedure Laterality Date  . Ovarian cyst removal  Mar 25 2010  . Biopsy of right breast    . Breast biopsy  1999    right  . Ovarian cyst removal  03/25/2010    right    Family History  Problem Relation Age of Onset  . Hypertension Mother   . Diabetes Mother   . Cancer Father   . Hypertension Father   . Diabetes Brother   . Hypertension Brother     Social History History  Substance Use Topics  . Smoking status: Current Every Day Smoker -- 0.50 packs/day for 10 years    Types: Cigarettes  . Smokeless tobacco: Never Used  . Alcohol Use: Yes     Comment: occas.    No Known Allergies  Current Outpatient Prescriptions  Medication Sig Dispense Refill  . clindamycin (CLEOCIN) 150 MG capsule Take 1 capsule (150 mg total) by mouth 3 (three) times daily.  21 capsule  0  . diclofenac (VOLTAREN) 75 MG EC tablet Take 1 tablet (75 mg total) by mouth 2 (two) times daily with a meal.  40 tablet  2  . ibuprofen (ADVIL,MOTRIN) 200 MG tablet Take 400 mg by mouth every 6 (six) hours as needed for pain.      . metroNIDAZOLE (FLAGYL) 500 MG tablet Take 1 tablet (500 mg total) by mouth 2 (two) times daily.  14 tablet  0  . [DISCONTINUED] Norgestimate-Ethinyl Estradiol Triphasic (TRI-SPRINTEC) 0.18/0.215/0.25 MG-35 MCG tablet Take 1 tablet by mouth  daily.  1 Package  11   No current facility-administered medications for this visit.    Review of Systems Review of Systems  Gastrointestinal: Negative for abdominal pain.  Genitourinary: Positive for vaginal discharge (odor). Negative for dysuria, flank pain, vaginal bleeding and pelvic pain.  Musculoskeletal: Positive for back pain (lower back, radiates to left groin, strted 5 days ago).    Blood pressure 113/80, pulse 91, temperature 98.2 F (36.8 C), temperature source Oral, resp. rate 20, height 5\' 2"  (1.575 m), weight 170 lb 8 oz (77.338 kg), last menstrual period 06/21/2012.  Physical Exam Physical Exam  Constitutional: She appears well-developed and well-nourished. No distress.  Abdominal: Soft. She exhibits no distension. There is no tenderness.  Genitourinary: Uterus normal. Vaginal discharge found.  Vaginal d/c white, odor. Wet prep No mass or tenderness  Musculoskeletal: Normal range of motion. She exhibits no tenderness.  Skin: Skin is warm and dry.  Psychiatric: She has a normal mood and affect. Her behavior is normal.    Data Reviewed  *RADIOLOGY REPORT*  Clinical Data: Follow-up complex complex left ovarian cyst.  Hydrosalpinx and septate uterus. Pelvic inflammatory disease and  endometriosis. [name] 06/21/2012.  TRANSABDOMINAL AND TRANSVAGINAL ULTRASOUND OF PELVIS  Technique: Both transabdominal and transvaginal ultrasound  examinations of the pelvis were performed. Transabdominal  technique was performed for global imaging of the pelvis including  uterus, ovaries, adnexal regions, and pelvic cul-de-sac.  It was necessary to proceed with endovaginal exam following the  transabdominal exam to visualize the endometrium and ovary.  Comparison: 01/18/2012  Findings:  Uterus: 7.9 x 3.6 x 4.7 cm. Again noted is a partial a septate  uterus with a smooth fundal contour confirmed by 3-D volume  imaging. A small submucosal fibroid is seen in the left fundal  region  and denting the left fundal horn of the endometrial cavity.  This measures 1.3 cm in maximum diameter. No other fibroids  identified.  Endometrium: Endometrial thickness measures 8 mm in double layer  thickness transvaginally.  Right ovary: Surgically absent. No adnexal mass identified.  Left ovary: 4.1 x 4.3 x 3.2 cm. A persistent complex cyst with  fine reticular internal echoes is again seen, which measures 4.0 x  2.5 x 3.0 cm. This still has features consistent with a  hemorrhagic cyst, however as this persists, other potential  etiologies include endometrioma, peritoneal inclusion cyst, or less  likely hydro-pyo-salpinx.  Other Findings: No free fluid  IMPRESSION:  1. Subseptate uterus, with 1.3 cm submucosal fibroid in the left  fundal region.  2. Persistent 4 cm complex left ovarian cyst, with differential  diagnosis including recurrent or persistent hemorrhagic cyst,  endometrioma, peritoneal inclusion cyst, or less likely, hydro-pyo-  salpinx. Consider continued ultrasound followup in 6-12 weeks or  pelvic MRI.  Original Report Authenticated By: Myles Rosenthal, M.D.   Assessment    Endometriosis Suspect endometrioma left ovary, stable. Relatively asymptomatic, back pain may be MS   BV   Plan    Clindamycin 150 mg TID 7 days Voltaren 75 mg BID prn pain RTC prn or in 1 year         Giabella Duhart 07/06/2012, 3:13 PM

## 2012-07-07 ENCOUNTER — Telehealth: Payer: Self-pay

## 2012-07-07 LAB — WET PREP, GENITAL
Trich, Wet Prep: NONE SEEN
Yeast Wet Prep HPF POC: NONE SEEN

## 2012-07-07 LAB — RPR

## 2012-07-07 NOTE — Telephone Encounter (Signed)
Pt calling for test results.   Called pt and left message to return call to the clinics.

## 2012-07-08 NOTE — Telephone Encounter (Signed)
Spoke to patient. Gave STD culture results as requested. Patient satisfied.

## 2012-08-01 ENCOUNTER — Encounter (HOSPITAL_COMMUNITY): Payer: Self-pay

## 2012-08-01 ENCOUNTER — Emergency Department (HOSPITAL_COMMUNITY)
Admission: EM | Admit: 2012-08-01 | Discharge: 2012-08-01 | Disposition: A | Discharge status: Home / Self Care | Payer: No Typology Code available for payment source | Source: Home / Self Care

## 2012-08-01 DIAGNOSIS — S058X9A Other injuries of unspecified eye and orbit, initial encounter: Secondary | ICD-10-CM

## 2012-08-01 DIAGNOSIS — S0502XA Injury of conjunctiva and corneal abrasion without foreign body, left eye, initial encounter: Secondary | ICD-10-CM

## 2012-08-01 DIAGNOSIS — H169 Unspecified keratitis: Secondary | ICD-10-CM

## 2012-08-01 MED ORDER — DEXAMETHASONE 0.1 % OP SUSP: 1.0000 [drp] | Freq: Two times a day (BID) | OPHTHALMIC | Status: DC

## 2012-08-01 MED ORDER — TETRACAINE HCL 0.5 % OP SOLN
1.0000 [drp] | Freq: Once | OPHTHALMIC | Status: AC
  Administered 2012-08-01: 1 [drp] via OPHTHALMIC

## 2012-08-01 MED ORDER — TETRACAINE HCL 0.5 % OP SOLN
OPHTHALMIC | Status: AC
  Filled 2012-08-01: qty 2

## 2012-08-01 MED ORDER — POLYMYXIN B-TRIMETHOPRIM 10000-0.1 UNIT/ML-% OP SOLN: 1.0000 [drp] | OPHTHALMIC | Status: DC

## 2012-08-01 NOTE — ED Provider Notes (Signed)
Medical screening examination/treatment/procedure(s) were performed by resident physician or non-physician practitioner and as supervising physician I was immediately available for consultation/collaboration.   Esmeralda Blanford DOUGLAS MD.   Madisun Hargrove D Maryon Kemnitz, MD 08/01/12 1933 

## 2012-08-01 NOTE — ED Provider Notes (Signed)
History     CSN: [id number]  Arrival date & time 08/01/12  1309   First MD Initiated Contact with Patient 08/01/12 1433      Chief Complaint  Patient presents with  . Eye Problem    (Consider location/radiation/quality/duration/timing/severity/associated sxs/prior treatment) HPI Comments: 32 year old female complaining of irritation and redness to the left eye for one week. She states that she feels like there may have been something in the eye at one point. She is complaining of erythema primarily to the medial aspect of the sclera. She denies problems with vision. No blurred vision, double vision or pain.   Past Medical History  Diagnosis Date  . Ovarian cyst   . Viral labyrinthitis   . DENTAL PAIN   . IRREGULAR MENSTRUATION   . FEMALE INFERTILITY   . BOILS, RECURRENT   . SEPTATE UTERUS   . Endometriosis   . Breast tenderness in female     Past Surgical History  Procedure Laterality Date  . Ovarian cyst removal  Mar 25 2010  . Biopsy of right breast    . Breast biopsy  1999    right  . Ovarian cyst removal  03/25/2010    right    Family History  Problem Relation Age of Onset  . Hypertension Mother   . Diabetes Mother   . Cancer Father   . Hypertension Father   . Diabetes Brother   . Hypertension Brother     History  Substance Use Topics  . Smoking status: Current Every Day Smoker -- 0.50 packs/day for 10 years    Types: Cigarettes  . Smokeless tobacco: Never Used  . Alcohol Use: Yes     Comment: occas.    OB History   Grav Para Term Preterm Abortions TAB SAB Ect Mult Living   0         0      Review of Systems  Constitutional: Negative.   HENT: Negative.   Eyes: Positive for photophobia, redness and itching. Negative for discharge.  Respiratory: Negative.   Gastrointestinal: Negative.     Allergies  Review of patient's allergies indicates no known allergies.  Home Medications   Current Outpatient Rx  Name  Route  Sig  Dispense  Refill   . clindamycin (CLEOCIN) 150 MG capsule   Oral   Take 1 capsule (150 mg total) by mouth 3 (three) times daily.   21 capsule   0   . dexamethasone (DECADRON) 0.1 % ophthalmic suspension   Left Eye   Place 1 drop into the left eye 2 (two) times daily. Do not use for more than 4 days   5 mL   0   . diclofenac (VOLTAREN) 75 MG EC tablet   Oral   Take 1 tablet (75 mg total) by mouth 2 (two) times daily with a meal.   40 tablet   2   . ibuprofen (ADVIL,MOTRIN) 200 MG tablet   Oral   Take 400 mg by mouth every 6 (six) hours as needed for pain.         . metroNIDAZOLE (FLAGYL) 500 MG tablet   Oral   Take 1 tablet (500 mg total) by mouth 2 (two) times daily.   14 tablet   0   . trimethoprim-polymyxin b (POLYTRIM) ophthalmic solution   Left Eye   Place 1 drop into the left eye every 4 (four) hours.   10 mL   0     BP 112/78  Pulse  72  Temp(Src) 98.4 F (36.9 C) (Oral)  Resp 16  SpO2 100%  [name] 06/21/2012  Physical Exam  Nursing note and vitals reviewed. Constitutional: She is oriented to person, place, and time. She appears well-developed and well-nourished. No distress.  HENT:  Mouth/Throat: Oropharynx is clear and moist.  Eyes: Pupils are equal, round, and reactive to light.  Mild-to-moderate conjunctival erythema, prickly to the lower left lid. Mild swelling to the upper lip.  Neck: Normal range of motion. Neck supple.  Cardiovascular: Normal rate.   Pulmonary/Chest: Effort normal.  Neurological: She is alert and oriented to person, place, and time. She exhibits normal muscle tone.  Skin: Skin is warm and dry.    ED Course  Procedures (including critical care time)  Labs Reviewed - No data to display No results found.   1. Infected corneal abrasion, left, initial encounter       MDM  Instructions corneal abrasion.\\ If worse call the eye doctor.  One drop of tetracaine was placed into the left eye. After which the flouriscene  stain was applied the  eye was observed under the black light and there were 2 wide abrasions to the sclera which also cover the border of the cornea/iris. No foreign body was seen. The lid was everted. After observation. The eye was irrigated with copious amount of eye wash. Patient is treated with Decadron ophthalmic drops one drop twice a day for 4 days only, Polytrim one drop to left eye 4 times a day for 5 days. Any new symptoms problems or worsening may return or followup with the ophthalmologist as above.  Hayden Rasmussen, NP 08/01/12 1549  Hayden Rasmussen, NP 08/01/12 1550

## 2012-08-01 NOTE — ED Notes (Signed)
States she has been having pain in her left eye for pas few days , using OTC drops w/o relief; NAD

## 2012-08-15 ENCOUNTER — Encounter (HOSPITAL_COMMUNITY): Payer: Self-pay | Admitting: *Deleted

## 2012-08-15 ENCOUNTER — Emergency Department (INDEPENDENT_AMBULATORY_CARE_PROVIDER_SITE_OTHER)
Admission: EM | Admit: 2012-08-15 | Discharge: 2012-08-15 | Disposition: A | Discharge status: Home / Self Care | Payer: No Typology Code available for payment source | Source: Home / Self Care | Attending: Emergency Medicine | Admitting: Emergency Medicine

## 2012-08-15 DIAGNOSIS — H15102 Unspecified episcleritis, left eye: Secondary | ICD-10-CM

## 2012-08-15 DIAGNOSIS — H15009 Unspecified scleritis, unspecified eye: Secondary | ICD-10-CM

## 2012-08-15 NOTE — ED Provider Notes (Signed)
Chief Complaint:   Chief Complaint  Patient presents with  . Eye Problem    History of Present Illness:   [name] is a 32 year old female who has had a one-month history of redness, irritation, itching, and burning of her left eye. She denies any injury or foreign body in eye. She has a foreign body sensation in the eye. She has had some mucoid drainage. She denies any blurry vision or any changes in her vision at all. She was here week and a half ago. She was given some Polytrim eyedrops and dexamethasone eyedrops, but fails to improve and returns again today. She's otherwise in good health with no other medical problems.  Review of Systems:  Other than noted above, the patient denies any of the following symptoms: Systemic:  No fever, chills, sweats, fatigue, or weight loss. Eye:  No redness, eye pain, photophobia, discharge, blurred vision, or diplopia. ENT:  No nasal congestion, rhinorrhea, or sore throat. Lymphatic:  No adenopathy. Skin:  No rash or pruritis.  PMFSH:  Past medical history, family history, social history, meds, and allergies were reviewed.   Physical Exam:   Vital signs:  BP 118/82  Pulse 88  Temp(Src) 98.1 F (36.7 C) (Oral)  Resp 16 General:  Alert and in no distress. Eye:  Her lids are normal. There is localized erythema on the medial canthus of the conjunctiva. There is no drainage. Cornea is intact to fluorescein staining. Anterior chambers normal PERRLA, full EOMs, fundi benign.  ENT:  TMs and canals clear.  Nasal mucosa normal.  No intra-oral lesions, mucous membranes moist, pharynx clear. Neck:  No adenopathy tenderness or mass. Skin:  Clear, warm and dry.  Assessment:  The encounter diagnosis was Episcleritis of left eye.  Plan:   1.  The following meds were prescribed:   New Prescriptions   No medications on file   2.  The patient was instructed in symptomatic care and handouts were given. 3.  The patient was told to return if becoming worse  in any way, if no better in 3 or 4 days, and given some red flag symptoms such as any changes in her vision  that would indicate earlier return. 4.  Follow up with Dr. Peggye Pitt. His office was called. She will go right over there today.     Reuben Likes, MD 08/15/12 [phone]

## 2012-08-15 NOTE — ED Notes (Signed)
Pt    Reports  Symptoms  Of red  /  Irritated  l  Eye    For  About  1  month   Seen  About  3  Weeks  Ago  For  Same    -  Still  Having  Symptoms

## 2013-01-04 ENCOUNTER — Other Ambulatory Visit: Payer: Self-pay | Admitting: Family Medicine

## 2013-01-04 ENCOUNTER — Ambulatory Visit (INDEPENDENT_AMBULATORY_CARE_PROVIDER_SITE_OTHER): Payer: Self-pay | Admitting: Family Medicine

## 2013-01-04 ENCOUNTER — Encounter: Payer: Self-pay | Admitting: Family Medicine

## 2013-01-04 VITALS — BP 118/86 | HR 85 | Temp 98.4°F | Ht 62.0 in | Wt 177.1 lb

## 2013-01-04 DIAGNOSIS — N6311 Unspecified lump in the right breast, upper outer quadrant: Secondary | ICD-10-CM

## 2013-01-04 DIAGNOSIS — Z01419 Encounter for gynecological examination (general) (routine) without abnormal findings: Secondary | ICD-10-CM

## 2013-01-04 DIAGNOSIS — N63 Unspecified lump in unspecified breast: Secondary | ICD-10-CM

## 2013-01-04 DIAGNOSIS — N898 Other specified noninflammatory disorders of vagina: Secondary | ICD-10-CM

## 2013-01-04 DIAGNOSIS — Z1151 Encounter for screening for human papillomavirus (HPV): Secondary | ICD-10-CM

## 2013-01-04 MED ORDER — PRENATAL VITAMINS 0.8 MG PO TABS: 1.0000 | ORAL_TABLET | Freq: Every day | ORAL | Status: DC

## 2013-01-04 NOTE — Progress Notes (Signed)
Pt. States she has a white vaginal discharge, "probably BV because I get it often." Denies itchiness, burning or irritation but states it is more than normal. Pt. Also reports right breast tenderness and burning. Would like to have it checked.

## 2013-01-04 NOTE — Progress Notes (Signed)
Subjective:     [name] is a 32 y.o. female here for a routine exam.  Current complaints: vaginal discharge similar to BV treated with clinda in may. Pt would like to be screened for STDs   Pt also reports burning in right upper breast that has been going on for last several weeks on and off.  Gynecologic History Patient's last menstrual period was 12/12/2012. Contraception: condoms Last Pap: 2010. Results were: normal Last mammogram: 2013. Results were: normal  Obstetric History OB History  Gravida Para Term Preterm AB SAB TAB Ectopic Multiple Living  0         0         The following portions of the patient's history were reviewed and updated as appropriate: allergies, current medications, past family history, past medical history, past social history, past surgical history and problem list.  Review of Systems Pertinent items are noted in HPI.    Objective:    BP 118/86  Pulse 85  Temp(Src) 98.4 F (36.9 C) (Oral)  Ht 5\' 2"  (1.575 m)  Wt 177 lb 1.6 oz (80.332 kg)  BMI 32.38 kg/m2  [name] 12/12/2012  General Appearance:    Alert, cooperative, no distress, appears stated age  Breast Exam:    Tenderness with small palpable mass likely fibrous tissue 10 oclcok right breast, no toehr masses, or nipple abnormality  Abdomen:     Soft, non-tender, bowel sounds active all four quadrants,    no masses, no organomegaly  Genitalia:    Normal female without lesion, moderate white discharge or no tenderness  Lymph nodes:   Cervical, supraclavicular, and axillary nodes normal  Neurologic:   CNII-XII intact, normal strength, sensation and reflexes    throughout      Assessment:    Healthy female exam.    Plan:  [place] of right breast for evaluation Wet mount, pap, gc/c - treat PRN results F/u PRN  Tawana Scale, MD OB Fellow

## 2013-01-05 LAB — WET PREP, GENITAL: WBC, Wet Prep HPF POC: NONE SEEN

## 2013-01-06 ENCOUNTER — Telehealth: Payer: Self-pay | Admitting: Family Medicine

## 2013-01-06 DIAGNOSIS — B9689 Other specified bacterial agents as the cause of diseases classified elsewhere: Secondary | ICD-10-CM

## 2013-01-06 DIAGNOSIS — N760 Acute vaginitis: Secondary | ICD-10-CM

## 2013-01-06 MED ORDER — CLINDAMYCIN HCL 300 MG PO CAPS: 300.0000 mg | ORAL_CAPSULE | Freq: Three times a day (TID) | ORAL | Status: DC

## 2013-01-06 NOTE — Telephone Encounter (Signed)
+   clue cells and sx of BV. Will tx with clinda.  Tawana Scale, MD OB Fellow

## 2013-01-09 ENCOUNTER — Telehealth: Payer: Self-pay | Admitting: General Practice

## 2013-01-09 ENCOUNTER — Other Ambulatory Visit: Payer: Self-pay

## 2013-01-09 DIAGNOSIS — B9689 Other specified bacterial agents as the cause of diseases classified elsewhere: Secondary | ICD-10-CM

## 2013-01-09 DIAGNOSIS — N760 Acute vaginitis: Secondary | ICD-10-CM

## 2013-01-09 MED ORDER — CLINDAMYCIN HCL 300 MG PO CAPS: 300.0000 mg | ORAL_CAPSULE | Freq: Three times a day (TID) | ORAL | Status: DC

## 2013-01-09 MED ORDER — PRENATAL VITAMINS PLUS 27-1 MG PO TABS: 1.0000 | ORAL_TABLET | Freq: Every day | ORAL | Status: DC

## 2013-01-09 NOTE — Telephone Encounter (Signed)
Pt called and stated that her Rx are not her pharmacy and that the prenatal vitamin that was ordered her pharmacy does not carry.  I informed pt that I would call the pharmacy and phone in the Rx and to please give them until the end of the day. Called pt pharmacy and spoke with Yaw, pharmacist, phoned in clindamycin and veriifed PNV were there.

## 2013-01-09 NOTE — Telephone Encounter (Signed)
Opened in error

## 2013-01-09 NOTE — Telephone Encounter (Deleted)
Patient called and left message stating she is calling about her PNV, the pharmacy told her they do not make this kind anymore and also the medication a nurse told her earlier about is not at her pharmacy yet

## 2013-01-09 NOTE — Telephone Encounter (Signed)
Patient called requesting test results from last Thursday's visit on 11/27. Called patient back and informed her of results and antibiotic available for pickup at her [facility] pharmacy. Patient verbalized understanding and had no further questions

## 2013-01-10 ENCOUNTER — Telehealth: Payer: Self-pay

## 2013-01-10 NOTE — Telephone Encounter (Signed)
Pt called wanting test results.  I informed patient that her pap results are normal.  Pt stated understanding with no further questions.

## 2013-01-11 ENCOUNTER — Encounter: Payer: Self-pay | Admitting: Family Medicine

## 2013-01-11 DIAGNOSIS — Z124 Encounter for screening for malignant neoplasm of cervix: Secondary | ICD-10-CM | POA: Insufficient documentation

## 2013-01-18 ENCOUNTER — Other Ambulatory Visit: Payer: Self-pay

## 2013-01-18 ENCOUNTER — Other Ambulatory Visit: Payer: Self-pay | Admitting: Family Medicine

## 2013-01-18 DIAGNOSIS — N6311 Unspecified lump in the right breast, upper outer quadrant: Secondary | ICD-10-CM

## 2013-01-19 ENCOUNTER — Ambulatory Visit
Admission: RE | Admit: 2013-01-19 | Discharge: 2013-01-19 | Disposition: A | Discharge status: Home / Self Care | Payer: 59 | Source: Ambulatory Visit | Attending: Family Medicine | Admitting: Family Medicine

## 2013-01-19 DIAGNOSIS — N6311 Unspecified lump in the right breast, upper outer quadrant: Secondary | ICD-10-CM

## 2013-01-22 ENCOUNTER — Encounter: Payer: Self-pay | Admitting: Family Medicine

## 2013-01-22 DIAGNOSIS — N63 Unspecified lump in unspecified breast: Secondary | ICD-10-CM | POA: Insufficient documentation

## 2013-01-22 DIAGNOSIS — Z87898 Personal history of other specified conditions: Secondary | ICD-10-CM | POA: Insufficient documentation

## 2013-02-05 ENCOUNTER — Encounter (HOSPITAL_COMMUNITY): Payer: Self-pay | Admitting: Emergency Medicine

## 2013-02-05 DIAGNOSIS — Z8719 Personal history of other diseases of the digestive system: Secondary | ICD-10-CM | POA: Insufficient documentation

## 2013-02-05 DIAGNOSIS — Z8742 Personal history of other diseases of the female genital tract: Secondary | ICD-10-CM | POA: Insufficient documentation

## 2013-02-05 DIAGNOSIS — B9789 Other viral agents as the cause of diseases classified elsewhere: Secondary | ICD-10-CM | POA: Insufficient documentation

## 2013-02-05 DIAGNOSIS — R1013 Epigastric pain: Secondary | ICD-10-CM | POA: Insufficient documentation

## 2013-02-05 DIAGNOSIS — Z3202 Encounter for pregnancy test, result negative: Secondary | ICD-10-CM | POA: Insufficient documentation

## 2013-02-05 DIAGNOSIS — Z87718 Personal history of other specified (corrected) congenital malformations of genitourinary system: Secondary | ICD-10-CM | POA: Insufficient documentation

## 2013-02-05 DIAGNOSIS — Z79899 Other long term (current) drug therapy: Secondary | ICD-10-CM | POA: Insufficient documentation

## 2013-02-05 DIAGNOSIS — Z872 Personal history of diseases of the skin and subcutaneous tissue: Secondary | ICD-10-CM | POA: Insufficient documentation

## 2013-02-05 DIAGNOSIS — R197 Diarrhea, unspecified: Secondary | ICD-10-CM | POA: Insufficient documentation

## 2013-02-05 DIAGNOSIS — R1011 Right upper quadrant pain: Secondary | ICD-10-CM | POA: Insufficient documentation

## 2013-02-05 DIAGNOSIS — F17200 Nicotine dependence, unspecified, uncomplicated: Secondary | ICD-10-CM | POA: Insufficient documentation

## 2013-02-05 DIAGNOSIS — Z8669 Personal history of other diseases of the nervous system and sense organs: Secondary | ICD-10-CM | POA: Insufficient documentation

## 2013-02-05 LAB — CBC WITH DIFFERENTIAL/PLATELET
Basophils Relative: 0 % (ref 0–1)
HCT: 39.8 % (ref 36.0–46.0)
Hemoglobin: 14.2 g/dL (ref 12.0–15.0)
Lymphocytes Relative: 46 % (ref 12–46)
MCHC: 35.7 g/dL (ref 30.0–36.0)
Monocytes Absolute: 0.7 10*3/uL (ref 0.1–1.0)
Monocytes Relative: 13 % — ABNORMAL HIGH (ref 3–12)
Neutro Abs: 2.2 10*3/uL (ref 1.7–7.7)
Neutrophils Relative %: 41 % — ABNORMAL LOW (ref 43–77)
RBC: 4.73 MIL/uL (ref 3.87–5.11)
WBC: 5.3 10*3/uL (ref 4.0–10.5)

## 2013-02-05 MED ORDER — ONDANSETRON 4 MG PO TBDP
4.0000 mg | ORAL_TABLET | Freq: Once | ORAL | Status: AC
  Administered 2013-02-05: 4 mg via ORAL
  Filled 2013-02-05: qty 1

## 2013-02-05 NOTE — ED Notes (Signed)
Pt states N/V for the past several hours. Pt tried to take prilosec and she vomited it up. Pt states that her pain is generalized abdomen. Pt denies diarrhea.

## 2013-02-06 ENCOUNTER — Emergency Department (HOSPITAL_COMMUNITY)
Admission: EM | Admit: 2013-02-06 | Discharge: 2013-02-06 | Disposition: A | Discharge status: Home / Self Care | Payer: No Typology Code available for payment source | Attending: Emergency Medicine | Admitting: Emergency Medicine

## 2013-02-06 ENCOUNTER — Emergency Department (HOSPITAL_COMMUNITY): Payer: No Typology Code available for payment source

## 2013-02-06 DIAGNOSIS — B349 Viral infection, unspecified: Secondary | ICD-10-CM

## 2013-02-06 DIAGNOSIS — R1013 Epigastric pain: Secondary | ICD-10-CM

## 2013-02-06 LAB — URINALYSIS, ROUTINE W REFLEX MICROSCOPIC
Glucose, UA: NEGATIVE mg/dL
Hgb urine dipstick: NEGATIVE
Ketones, ur: >80 mg/dL — AB
Protein, ur: NEGATIVE mg/dL
Specific Gravity, Urine: 1.024 (ref 1.005–1.030)
Urobilinogen, UA: 0.2 mg/dL (ref 0.0–1.0)

## 2013-02-06 LAB — COMPREHENSIVE METABOLIC PANEL
AST: 19 U/L (ref 0–37)
Albumin: 4 g/dL (ref 3.5–5.2)
Alkaline Phosphatase: 57 U/L (ref 39–117)
BUN: 8 mg/dL (ref 6–23)
CO2: 23 mEq/L (ref 19–32)
Chloride: 100 mEq/L (ref 96–112)
GFR calc non Af Amer: >90 mL/min (ref >90)
Glucose, Bld: 97 mg/dL (ref 70–99)
Potassium: 3.8 mEq/L (ref 3.5–5.1)
Total Bilirubin: 0.5 mg/dL (ref 0.3–1.2)
Total Protein: 7.9 g/dL (ref 6.0–8.3)

## 2013-02-06 MED ORDER — OSELTAMIVIR PHOSPHATE 75 MG PO CAPS: 75.0000 mg | ORAL_CAPSULE | Freq: Two times a day (BID) | ORAL | Status: DC

## 2013-02-06 MED ORDER — FAMOTIDINE 20 MG PO TABS
20.0000 mg | ORAL_TABLET | Freq: Once | ORAL | Status: AC
  Administered 2013-02-06: 20 mg via ORAL
  Filled 2013-02-06: qty 1

## 2013-02-06 MED ORDER — ONDANSETRON HCL 4 MG/2ML IJ SOLN
4.0000 mg | Freq: Once | INTRAMUSCULAR | Status: AC
  Administered 2013-02-06: 4 mg via INTRAVENOUS
  Filled 2013-02-06: qty 2

## 2013-02-06 MED ORDER — HYDROMORPHONE HCL PF 1 MG/ML IJ SOLN
1.0000 mg | Freq: Once | INTRAMUSCULAR | Status: AC
  Administered 2013-02-06: 1 mg via INTRAVENOUS
  Filled 2013-02-06: qty 1

## 2013-02-06 MED ORDER — ONDANSETRON HCL 4 MG PO TABS: 4.0000 mg | ORAL_TABLET | Freq: Four times a day (QID) | ORAL | Status: DC

## 2013-02-06 MED ORDER — FAMOTIDINE 20 MG PO TABS: 20.0000 mg | ORAL_TABLET | Freq: Two times a day (BID) | ORAL | Status: DC

## 2013-02-06 MED ORDER — SODIUM CHLORIDE 0.9 % IV BOLUS (SEPSIS)
1000.0000 mL | Freq: Once | INTRAVENOUS | Status: AC
  Administered 2013-02-06: 1000 mL via INTRAVENOUS

## 2013-02-06 MED ORDER — GI COCKTAIL ~~LOC~~
30.0000 mL | Freq: Once | ORAL | Status: AC
  Administered 2013-02-06: 30 mL via ORAL
  Filled 2013-02-06: qty 30

## 2013-02-06 NOTE — ED Provider Notes (Signed)
CSN: [id number]     Arrival date & time 02/05/13  2302 History   First MD Initiated Contact with Patient 02/06/13 0149     Chief Complaint  Patient presents with  . Abdominal Pain   (Consider location/radiation/quality/duration/timing/severity/associated sxs/prior Treatment) Patient is a 32 y.o. female presenting with abdominal pain.  Abdominal Pain Associated symptoms: chills, cough, diarrhea, fever, nausea and vomiting   Associated symptoms: no dysuria and no shortness of breath    History provided by patient. Sick for the last 3-4 days with cough cold and congestion. Tonight developed epigastric pain and burning. She also has associated nausea and vomiting and diarrhea. No blood in emesis or stools. She has felt febrile and had chills. No known history of gallbladder problems. She took Prilosec tonight without relief. She denies history of GERD. Symptoms moderate in severity. Patient states unable to sleep tonight with symptoms presents here for evaluation. She denies any lower abdominal pain, pelvic pain, vaginal bleeding or discharge.  Past Medical History  Diagnosis Date  . Ovarian cyst   . Viral labyrinthitis   . DENTAL PAIN   . IRREGULAR MENSTRUATION   . FEMALE INFERTILITY   . BOILS, RECURRENT   . SEPTATE UTERUS   . Endometriosis   . Breast tenderness in female    Past Surgical History  Procedure Laterality Date  . Ovarian cyst removal  Mar 25 2010  . Biopsy of right breast    . Breast biopsy  1999    right  . Ovarian cyst removal  03/25/2010    right   Family History  Problem Relation Age of Onset  . Hypertension Mother   . Diabetes Mother   . Cancer Father   . Hypertension Father   . Diabetes Brother   . Hypertension Brother    History  Substance Use Topics  . Smoking status: Current Every Day Smoker -- 0.50 packs/day for 10 years    Types: Cigarettes  . Smokeless tobacco: Never Used  . Alcohol Use: Yes     Comment: occas.   OB History   Grav Para Term  Preterm Abortions TAB SAB Ect Mult Living   0         0     Review of Systems  Constitutional: Positive for fever and chills.  HENT: Positive for congestion.   Eyes: Negative for visual disturbance.  Respiratory: Positive for cough. Negative for shortness of breath.   Gastrointestinal: Positive for nausea, vomiting, abdominal pain and diarrhea.  Genitourinary: Negative for dysuria.  Musculoskeletal: Negative for back pain, neck pain and neck stiffness.  Skin: Negative for rash.  Neurological: Negative for headaches.  All other systems reviewed and are negative.    Allergies  Review of patient's allergies indicates no known allergies.  Home Medications   Current Outpatient Rx  Name  Route  Sig  Dispense  Refill  . chlorpheniramine-HYDROcodone (TUSSIONEX) 10-8 MG/5ML LQCR   Oral   Take 5 mLs by mouth every 12 (twelve) hours as needed for cough.         Marland Kitchen guaiFENesin (ROBITUSSIN) 100 MG/5ML liquid   Oral   Take 200 mg by mouth 4 (four) times daily as needed for cough.         Marland Kitchen omeprazole (PRILOSEC) 20 MG capsule   Oral   Take 20 mg by mouth daily.          BP 123/73  Pulse 85  Temp(Src) 98.7 F (37.1 C) (Oral)  Resp 18  SpO2  99% Physical Exam  Constitutional: She is oriented to person, place, and time. She appears well-developed and well-nourished.  HENT:  Head: Normocephalic and atraumatic.  Mouth/Throat: Oropharynx is clear and moist. No oropharyngeal exudate.  Eyes: EOM are normal. Pupils are equal, round, and reactive to light.  Neck: Neck supple.  Cardiovascular: Normal rate, regular rhythm and intact distal pulses.   Pulmonary/Chest: Effort normal and breath sounds normal. No respiratory distress. She exhibits no tenderness.  Intermittent dry cough during exam  Abdominal: Soft. Bowel sounds are normal. She exhibits no distension. There is no rebound and no guarding.  Epigastric tenderness and some mild right upper quadrant tenderness. No abdominal  tenderness otherwise. No CVA tenderness. Negative Murphy sign.  Musculoskeletal: Normal range of motion. She exhibits no edema.  Neurological: She is alert and oriented to person, place, and time.  Skin: Skin is warm and dry.    ED Course  Procedures (including critical care time) Labs Review Labs Reviewed  CBC WITH DIFFERENTIAL - Abnormal; Notable for the following:    Neutrophils Relative % 41 (*)    Monocytes Relative 13 (*)    All other components within normal limits  COMPREHENSIVE METABOLIC PANEL - Abnormal; Notable for the following:    Sodium 133 (*)    All other components within normal limits  URINALYSIS, ROUTINE W REFLEX MICROSCOPIC - Abnormal; Notable for the following:    Bilirubin Urine SMALL (*)    Ketones, ur >80 (*)    All other components within normal limits  PREGNANCY, URINE   Imaging Review Dg Chest 2 View  02/06/2013   CLINICAL DATA:  Chest and abdominal pain.  Cough.  EXAM: CHEST  2 VIEW  COMPARISON:  None.  FINDINGS: The heart size and mediastinal contours are within normal limits. Both lungs are clear. The visualized skeletal structures are unremarkable.  IMPRESSION: No active cardiopulmonary disease.   Electronically Signed   By: Burman Nieves M.D.   On: 02/06/2013 02:29   US Abdomen Limited Ruq  02/06/2013   CLINICAL DATA:  Epigastric right upper quadrant pain.  EXAM: US ABDOMEN LIMITED - RIGHT UPPER QUADRANT  COMPARISON:  None.  FINDINGS: Gallbladder  No gallstones or wall thickening visualized. No sonographic Murphy sign noted.  Common bile duct  Diameter: 3.1 mm, normal  Liver:  No focal lesion identified. Within normal limits in parenchymal echogenicity.  IMPRESSION: No evidence of cholecystitis or cholelithiasis.   Electronically Signed   By: Burman Nieves M.D.   On: 02/06/2013 03:48    GI cocktail provided with no relief. IV Dilaudid provided and on recheck pain significantly improved.  Plan discharge home with prescription for Tamiflu, Zofran  and Pepcid.  Patient agrees to return precautions and close outpatient followup.  MDM  Diagnosis: Epigastric pain, viral infection  Cough cold congestion fevers concerning for flu. Patient also with epigastric pain likely gastritis. Evaluated with chest x-ray and ultrasound reviewed as above. Normal gallbladder. No significant collection light abnormalities, no leukocytosis. No UTI. Improved with medications Vital signs and nurses notes reviewed and considered   Sunnie Nielsen, MD 02/06/13 [phone]

## 2013-02-06 NOTE — ED Notes (Signed)
Patient transported to Ultrasound via stretcher 

## 2013-02-06 NOTE — ED Notes (Signed)
Pt states understanding of discharge instructions 

## 2013-02-06 NOTE — ED Notes (Signed)
Pt states that she is pain free but does not like the Hydromorphone makes her feel

## 2013-03-16 ENCOUNTER — Encounter (HOSPITAL_COMMUNITY): Payer: Self-pay | Admitting: Emergency Medicine

## 2013-03-16 ENCOUNTER — Other Ambulatory Visit (HOSPITAL_COMMUNITY)
Admission: RE | Admit: 2013-03-16 | Discharge: 2013-03-16 | Disposition: A | Discharge status: Home / Self Care | Payer: No Typology Code available for payment source | Source: Ambulatory Visit | Attending: Family Medicine | Admitting: Family Medicine

## 2013-03-16 ENCOUNTER — Emergency Department (INDEPENDENT_AMBULATORY_CARE_PROVIDER_SITE_OTHER)
Admission: EM | Admit: 2013-03-16 | Discharge: 2013-03-16 | Disposition: A | Discharge status: Home / Self Care | Payer: No Typology Code available for payment source | Source: Home / Self Care | Attending: Family Medicine | Admitting: Family Medicine

## 2013-03-16 DIAGNOSIS — Z113 Encounter for screening for infections with a predominantly sexual mode of transmission: Secondary | ICD-10-CM | POA: Insufficient documentation

## 2013-03-16 DIAGNOSIS — N760 Acute vaginitis: Secondary | ICD-10-CM

## 2013-03-16 LAB — POCT URINALYSIS DIP (DEVICE)
Bilirubin Urine: NEGATIVE
Glucose, UA: NEGATIVE mg/dL
Hgb urine dipstick: NEGATIVE
KETONES UR: NEGATIVE mg/dL
LEUKOCYTES UA: NEGATIVE
Nitrite: NEGATIVE
PH: 6.5 (ref 5.0–8.0)
PROTEIN: NEGATIVE mg/dL
Specific Gravity, Urine: 1.025 (ref 1.005–1.030)
UROBILINOGEN UA: 0.2 mg/dL (ref 0.0–1.0)

## 2013-03-16 LAB — POCT PREGNANCY, URINE: Preg Test, Ur: NEGATIVE

## 2013-03-16 LAB — HIV ANTIBODY (ROUTINE TESTING W REFLEX): HIV: NONREACTIVE

## 2013-03-16 LAB — RPR: RPR Ser Ql: NONREACTIVE

## 2013-03-16 MED ORDER — METRONIDAZOLE 500 MG PO TABS: 500.0000 mg | ORAL_TABLET | Freq: Two times a day (BID) | ORAL | Status: DC

## 2013-03-16 NOTE — ED Provider Notes (Signed)
[name] is a 33 y.o. female who presents to Urgent Care today for vaginal discharge. Patient is noted 4 days of vaginal discharge associated with mild abdominal pain. No fevers or chills nausea vomiting or diarrhea. No dysuria. Symptoms are consistent with prior episodes which are vaginosis. No medications tried yet. Patient would also like an HIV test today if possible.   Past Medical History  Diagnosis Date  . Ovarian cyst   . Viral labyrinthitis   . DENTAL PAIN   . IRREGULAR MENSTRUATION   . FEMALE INFERTILITY   . BOILS, RECURRENT   . SEPTATE UTERUS   . Endometriosis   . Breast tenderness in female    History  Substance Use Topics  . Smoking status: Current Every Day Smoker -- 0.50 packs/day for 10 years    Types: Cigarettes  . Smokeless tobacco: Never Used  . Alcohol Use: Yes     Comment: occas.   ROS as above Medications: No current facility-administered medications for this encounter.   Current Outpatient Prescriptions  Medication Sig Dispense Refill  . famotidine (PEPCID) 20 MG tablet Take 1 tablet (20 mg total) by mouth 2 (two) times daily.  30 tablet  0  . metroNIDAZOLE (FLAGYL) 500 MG tablet Take 1 tablet (500 mg total) by mouth 2 (two) times daily.  14 tablet  0  . omeprazole (PRILOSEC) 20 MG capsule Take 20 mg by mouth daily.      . [DISCONTINUED] Norgestimate-Ethinyl Estradiol Triphasic (TRI-SPRINTEC) 0.18/0.215/0.25 MG-35 MCG tablet Take 1 tablet by mouth daily.  1 Package  11    Exam:  BP 112/66  Pulse 85  Temp(Src) 98.4 F (36.9 C) (Oral)  Resp 18  SpO2 100%  [name] 03/07/2013 Gen: Well NAD HEENT: EOMI,  MMM Lungs: Normal work of breathing. CTABL Heart: RRR no MRG Abd: NABS, Soft. NT, ND Exts: Brisk capillary refill, warm and well perfused.  GYN: Normal external genitalia. Vaginal canal with thin white discharge. Normal-appearing cervix. No cervical motion tenderness or masses noted.  Results for orders placed during the hospital encounter of  03/16/13 (from the past 24 hour(s))  POCT URINALYSIS DIP (DEVICE)     Status: None   Collection Time    03/16/13 11:34 AM      Result Value Range   Glucose, UA NEGATIVE  NEGATIVE mg/dL   Bilirubin Urine NEGATIVE  NEGATIVE   Ketones, ur NEGATIVE  NEGATIVE mg/dL   Specific Gravity, Urine 1.025  1.005 - 1.030   Hgb urine dipstick NEGATIVE  NEGATIVE   pH 6.5  5.0 - 8.0   Protein, ur NEGATIVE  NEGATIVE mg/dL   Urobilinogen, UA 0.2  0.0 - 1.0 mg/dL   Nitrite NEGATIVE  NEGATIVE   Leukocytes, UA NEGATIVE  NEGATIVE  POCT PREGNANCY, URINE     Status: None   Collection Time    03/16/13 11:39 AM      Result Value Range   Preg Test, Ur NEGATIVE  NEGATIVE   No results found.  Assessment and Plan: 33 y.o. female with vaginal discharge. Appears to be bacterial vaginosis based on exam. Cytology pending. Plan to treat empirically with metronidazole. HIV and RPR also pending. Will call patient with results if abnormal.  Discussed warning signs or symptoms. Please see discharge instructions. Patient expresses understanding.    Gregor Hams, MD 03/16/13 [phone]

## 2013-03-16 NOTE — Discharge Instructions (Signed)
Thank you for coming in today. Take metronidazole twice daily for one week. Do not drink alcohol taking this medication. We will call you with the results if they are abnormal. If your belly pain worsens, or you have high fever, bad vomiting, blood in your stool or black tarry stool go to the Emergency Room.   Bacterial Vaginosis Bacterial vaginosis is a vaginal infection that occurs when the normal balance of bacteria in the vagina is disrupted. It results from an overgrowth of certain bacteria. This is the most common vaginal infection in women of childbearing age. Treatment is important to prevent complications, especially in pregnant women, as it can cause a premature delivery. CAUSES  Bacterial vaginosis is caused by an increase in harmful bacteria that are normally present in smaller amounts in the vagina. Several different kinds of bacteria can cause bacterial vaginosis. However, the reason that the condition develops is not fully understood. RISK FACTORS Certain activities or behaviors can put you at an increased risk of developing bacterial vaginosis, including:  Having a new sex partner or multiple sex partners.  Douching.  Using an intrauterine device (IUD) for contraception. Women do not get bacterial vaginosis from toilet seats, bedding, swimming pools, or contact with objects around them. SIGNS AND SYMPTOMS  Some women with bacterial vaginosis have no signs or symptoms. Common symptoms include:  Grey vaginal discharge.  A fishlike odor with discharge, especially after sexual intercourse.  Itching or burning of the vagina and vulva.  Burning or pain with urination. DIAGNOSIS  Your health care provider will take a medical history and examine the vagina for signs of bacterial vaginosis. A sample of vaginal fluid may be taken. Your health care provider will look at this sample under a microscope to check for bacteria and abnormal cells. A vaginal pH test may also be done.    TREATMENT  Bacterial vaginosis may be treated with antibiotic medicines. These may be given in the form of a pill or a vaginal cream. A second round of antibiotics may be prescribed if the condition comes back after treatment.  HOME CARE INSTRUCTIONS   Only take over-the-counter or prescription medicines as directed by your health care provider.  If antibiotic medicine was prescribed, take it as directed. Make sure you finish it even if you start to feel better.  Do not have sex until treatment is completed.  Tell all sexual partners that you have a vaginal infection. They should see their health care provider and be treated if they have problems, such as a mild rash or itching.  Practice safe sex by using condoms and only having one sex partner. SEEK MEDICAL CARE IF:   Your symptoms are not improving after 3 days of treatment.  You have increased discharge or pain.  You have a fever. MAKE SURE YOU:   Understand these instructions.  Will watch your condition.  Will get help right away if you are not doing well or get worse. FOR MORE INFORMATION  Centers for Disease Control and Prevention, Division of STD Prevention: AppraiserFraud.fi American Sexual Health Association (ASHA): www.ashastd.org  Document Released: 01/26/2005 Document Revised: 11/16/2012 Document Reviewed: 09/07/2012 [facility] Patient Information 2014 [place].

## 2013-03-16 NOTE — ED Notes (Signed)
C/o vaginal discharge that started 4 days ago. Discharge is white/pinkish with a faint odor and is causing abdominal pain. Pain is 5/10. Patient also stated that she would like HIV testing. Denies any other sx. Written by: Lenore Manner, SMA

## 2013-03-20 ENCOUNTER — Telehealth (HOSPITAL_COMMUNITY): Payer: Self-pay | Admitting: *Deleted

## 2013-03-20 NOTE — ED Notes (Signed)
Pt. called in for her lab results. Pt. verified x 2 and given results.  Pt. told they were all neg. ( GC/Chlamydia/Affirm neg., HIV/RPR non-reactive.)  Pt. instructed if she had unprotected sex to have HIV rechecked in 76 mos. [name]. 03/20/2013

## 2013-04-30 ENCOUNTER — Emergency Department (INDEPENDENT_AMBULATORY_CARE_PROVIDER_SITE_OTHER)
Admission: EM | Admit: 2013-04-30 | Discharge: 2013-04-30 | Disposition: A | Discharge status: Home / Self Care | Payer: No Typology Code available for payment source | Source: Home / Self Care | Attending: Emergency Medicine | Admitting: Emergency Medicine

## 2013-04-30 ENCOUNTER — Encounter (HOSPITAL_COMMUNITY): Payer: Self-pay | Admitting: Emergency Medicine

## 2013-04-30 ENCOUNTER — Other Ambulatory Visit (HOSPITAL_COMMUNITY)
Admission: RE | Admit: 2013-04-30 | Discharge: 2013-04-30 | Disposition: A | Discharge status: Home / Self Care | Payer: No Typology Code available for payment source | Source: Ambulatory Visit | Attending: Emergency Medicine | Admitting: Emergency Medicine

## 2013-04-30 DIAGNOSIS — A499 Bacterial infection, unspecified: Secondary | ICD-10-CM

## 2013-04-30 DIAGNOSIS — B9689 Other specified bacterial agents as the cause of diseases classified elsewhere: Secondary | ICD-10-CM

## 2013-04-30 DIAGNOSIS — N760 Acute vaginitis: Secondary | ICD-10-CM | POA: Insufficient documentation

## 2013-04-30 DIAGNOSIS — Z113 Encounter for screening for infections with a predominantly sexual mode of transmission: Secondary | ICD-10-CM | POA: Insufficient documentation

## 2013-04-30 LAB — POCT URINALYSIS DIP (DEVICE)
Bilirubin Urine: NEGATIVE
Glucose, UA: NEGATIVE mg/dL
Hgb urine dipstick: NEGATIVE
Ketones, ur: 15 mg/dL — AB
Leukocytes, UA: NEGATIVE
Nitrite: NEGATIVE
Protein, ur: NEGATIVE mg/dL
Specific Gravity, Urine: >=1.03 (ref 1.005–1.030)
Urobilinogen, UA: 0.2 mg/dL (ref 0.0–1.0)
pH: 5.5 (ref 5.0–8.0)

## 2013-04-30 LAB — POCT PREGNANCY, URINE: PREG TEST UR: NEGATIVE

## 2013-04-30 MED ORDER — CLINDAMYCIN HCL 300 MG PO CAPS: ORAL_CAPSULE | ORAL | Status: DC

## 2013-04-30 NOTE — Discharge Instructions (Signed)
To restore the normal balance of "good bacteria" in your system.  Take a probiotic once daily.  These can be gotten over the counter at the drug store without a prescription and come under various brand names such as Culturelle, Align, Florastore, and Phillips.  The best thing to do is to ask your pharmacist to recommend a good probiotic that is not too expensive.  ° ° °Bacterial Vaginosis °Bacterial vaginosis is a vaginal infection that occurs when the normal balance of bacteria in the vagina is disrupted. It results from an overgrowth of certain bacteria. This is the most common vaginal infection in women of childbearing age. Treatment is important to prevent complications, especially in pregnant women, as it can cause a premature delivery. °CAUSES  °Bacterial vaginosis is caused by an increase in harmful bacteria that are normally present in smaller amounts in the vagina. Several different kinds of bacteria can cause bacterial vaginosis. However, the reason that the condition develops is not fully understood. °RISK FACTORS °Certain activities or behaviors can put you at an increased risk of developing bacterial vaginosis, including: °· Having a new sex partner or multiple sex partners. °· Douching. °· Using an intrauterine device (IUD) for contraception. °Women do not get bacterial vaginosis from toilet seats, bedding, swimming pools, or contact with objects around them. °SIGNS AND SYMPTOMS  °Some women with bacterial vaginosis have no signs or symptoms. Common symptoms include: °· Grey vaginal discharge. °· A fishlike odor with discharge, especially after sexual intercourse. °· Itching or burning of the vagina and vulva. °· Burning or pain with urination. °DIAGNOSIS  °Your health care provider will take a medical history and examine the vagina for signs of bacterial vaginosis. A sample of vaginal fluid may be taken. Your health care provider will look at this sample under a microscope to check for bacteria and  abnormal cells. A vaginal pH test may also be done.  °TREATMENT  °Bacterial vaginosis may be treated with antibiotic medicines. These may be given in the form of a pill or a vaginal cream. A second round of antibiotics may be prescribed if the condition comes back after treatment.  °HOME CARE INSTRUCTIONS  °· Only take over-the-counter or prescription medicines as directed by your health care provider. °· If antibiotic medicine was prescribed, take it as directed. Make sure you finish it even if you start to feel better. °· Do not have sex until treatment is completed. °· Tell all sexual partners that you have a vaginal infection. They should see their health care provider and be treated if they have problems, such as a mild rash or itching. °· Practice safe sex by using condoms and only having one sex partner. °SEEK MEDICAL CARE IF:  °· Your symptoms are not improving after 3 days of treatment. °· You have increased discharge or pain. °· You have a fever. °MAKE SURE YOU:  °· Understand these instructions. °· Will watch your condition. °· Will get help right away if you are not doing well or get worse. °FOR MORE INFORMATION  °Centers for Disease Control and Prevention, Division of STD Prevention: www.cdc.gov/std °American Sexual Health Association (ASHA): www.ashastd.org  °Document Released: 01/26/2005 Document Revised: 11/16/2012 Document Reviewed: 09/07/2012 °[employer]® Patient Information ©2014 [employer]. ° °

## 2013-04-30 NOTE — ED Provider Notes (Signed)
Chief Complaint   Chief Complaint  Patient presents with  . Vaginal Discharge    History of Present Illness   [name] is a 33 year old female who presents for continued vaginal discharge and older. She was seen about a month ago with the same thing with a diagnosis of bacterial vaginosis. She was given metronidazole but this has not helped at all. She has tried clindamycin the past with good results. She also has had some pelvic and lower back pain. Denies fever, chills, nausea, vomiting, or urinary symptoms. Her menses have been regular. She uses birth control pills and condoms.  Review of Systems   Other than as noted above, the patient denies any of the following symptoms: Systemic:  No fever or chills GI:  No abdominal pain, nausea, vomiting, diarrhea, constipation, melena or hematochezia. GU:  No dysuria, frequency, urgency, hematuria, vaginal discharge, itching, or abnormal vaginal bleeding.  [place]   Past medical history, family history, social history, meds, and allergies were reviewed.    Physical Examination    Vital signs:  BP 104/64  Pulse 93  Temp(Src) 98.4 F (36.9 C) (Oral)  Resp 18  SpO2 100% General:  Alert, oriented and in no distress. Lungs:  Breath sounds clear and equal bilaterally.  No wheezes, rales or rhonchi. Heart:  Regular rhythm.  No gallops or murmers. Abdomen:  Soft, flat and non-distended.  No organomegaly or mass.  No tenderness, guarding or rebound.  Bowel sounds normally active. Pelvic exam:  Normal external genitalia. Vaginal and cervical mucosa were normal. There was a heavy, thick, white, homogeneous, malodorous discharge. No cervical motion pain. Uterus was midposition and normal in size and shape and mildly tender. She has moderate bilateral adnexal tenderness but no mass.  DNA probes for gonorrhea, Chlamydia, Trichomonas, Gardnerella, Candida were obtained. Skin:  Clear, warm and dry.  Labs   Results for orders placed during the  hospital encounter of 04/30/13  POCT URINALYSIS DIP (DEVICE)      Result Value Ref Range   Glucose, UA NEGATIVE  NEGATIVE mg/dL   Bilirubin Urine NEGATIVE  NEGATIVE   Ketones, ur 15 (*) NEGATIVE mg/dL   Specific Gravity, Urine >=1.030  1.005 - 1.030   Hgb urine dipstick NEGATIVE  NEGATIVE   pH 5.5  5.0 - 8.0   Protein, ur NEGATIVE  NEGATIVE mg/dL   Urobilinogen, UA 0.2  0.0 - 1.0 mg/dL   Nitrite NEGATIVE  NEGATIVE   Leukocytes, UA NEGATIVE  NEGATIVE  POCT PREGNANCY, URINE      Result Value Ref Range   Preg Test, Ur NEGATIVE  NEGATIVE    Assessment   The encounter diagnosis was BV (bacterial vaginosis).  She seems to have bacterial vaginosis that is resistant to metronidazole. Will treat with clindamycin and probiotics.       Plan    1.  Meds:  The following meds were prescribed:   Discharge Medication List as of 04/30/2013 10:13 AM    START taking these medications   Details  clindamycin (CLEOCIN) 300 MG capsule 1 BID, Normal        2.  Patient Education/Counseling:  The patient was given appropriate handouts, self care instructions, and instructed in symptomatic relief.    3.  Follow up:  The patient was told to follow up here if no better in 3 to 4 days, or sooner if becoming worse in any way, and given some red flag symptoms such as worsening pain, fever, persistent vomiting, or heavy vaginal  bleeding which would prompt immediate return.       Harden Mo, MD 04/30/13 [phone]

## 2013-04-30 NOTE — ED Notes (Signed)
Patient complains of vaginal discharge for a couple of weeks now, previously treated with Metronidazole 500 Mg, states this did not help her at all. She wants to get the Clindamycine150 Mg, three times daily that previously worked for her to treat BV

## 2013-05-01 LAB — CERVICOVAGINAL ANCILLARY ONLY
Chlamydia: NEGATIVE
Neisseria Gonorrhea: NEGATIVE
WET PREP (BD AFFIRM): NEGATIVE
Wet Prep (BD Affirm): NEGATIVE
Wet Prep (BD Affirm): POSITIVE — AB

## 2013-05-02 NOTE — ED Notes (Signed)
GC/Chlamydia neg., Affirm: Candida and Trich neg. and Gardnerella pos. Pt. adequately treated with Cleocin. Roselyn Meier 05/02/2013

## 2013-05-02 NOTE — Progress Notes (Signed)
Quick Note:  Results are abnormal as noted, but have been adequately treated. No further action necessary. ______ 

## 2013-05-21 ENCOUNTER — Other Ambulatory Visit: Payer: Self-pay | Admitting: Obstetrics & Gynecology

## 2013-05-25 ENCOUNTER — Inpatient Hospital Stay (HOSPITAL_COMMUNITY)
Admission: AD | Admit: 2013-05-25 | Discharge: 2013-05-25 | Disposition: A | Discharge status: Home / Self Care | Payer: No Typology Code available for payment source | Source: Ambulatory Visit | Attending: Obstetrics & Gynecology | Admitting: Obstetrics & Gynecology

## 2013-05-25 ENCOUNTER — Encounter (HOSPITAL_COMMUNITY): Payer: Self-pay | Admitting: *Deleted

## 2013-05-25 DIAGNOSIS — X58XXXA Exposure to other specified factors, initial encounter: Secondary | ICD-10-CM | POA: Insufficient documentation

## 2013-05-25 DIAGNOSIS — S335XXA Sprain of ligaments of lumbar spine, initial encounter: Principal | ICD-10-CM

## 2013-05-25 DIAGNOSIS — M545 Low back pain, unspecified: Secondary | ICD-10-CM

## 2013-05-25 DIAGNOSIS — Q5128 Other doubling of uterus, other specified: Secondary | ICD-10-CM | POA: Insufficient documentation

## 2013-05-25 DIAGNOSIS — N898 Other specified noninflammatory disorders of vagina: Secondary | ICD-10-CM | POA: Insufficient documentation

## 2013-05-25 DIAGNOSIS — Y929 Unspecified place or not applicable: Secondary | ICD-10-CM | POA: Insufficient documentation

## 2013-05-25 DIAGNOSIS — Q512 Other doubling of uterus, unspecified: Secondary | ICD-10-CM

## 2013-05-25 DIAGNOSIS — N926 Irregular menstruation, unspecified: Secondary | ICD-10-CM | POA: Insufficient documentation

## 2013-05-25 DIAGNOSIS — F17200 Nicotine dependence, unspecified, uncomplicated: Secondary | ICD-10-CM | POA: Insufficient documentation

## 2013-05-25 DIAGNOSIS — Z202 Contact with and (suspected) exposure to infections with a predominantly sexual mode of transmission: Secondary | ICD-10-CM

## 2013-05-25 DIAGNOSIS — S339XXA Sprain of unspecified parts of lumbar spine and pelvis, initial encounter: Secondary | ICD-10-CM | POA: Insufficient documentation

## 2013-05-25 DIAGNOSIS — B3731 Acute candidiasis of vulva and vagina: Secondary | ICD-10-CM

## 2013-05-25 DIAGNOSIS — S39012A Strain of muscle, fascia and tendon of lower back, initial encounter: Secondary | ICD-10-CM

## 2013-05-25 DIAGNOSIS — B373 Candidiasis of vulva and vagina: Secondary | ICD-10-CM | POA: Insufficient documentation

## 2013-05-25 DIAGNOSIS — Z20828 Contact with and (suspected) exposure to other viral communicable diseases: Secondary | ICD-10-CM | POA: Insufficient documentation

## 2013-05-25 LAB — URINALYSIS, ROUTINE W REFLEX MICROSCOPIC
Bilirubin Urine: NEGATIVE
GLUCOSE, UA: NEGATIVE mg/dL
HGB URINE DIPSTICK: NEGATIVE
Ketones, ur: NEGATIVE mg/dL
LEUKOCYTES UA: NEGATIVE
Nitrite: NEGATIVE
PROTEIN: NEGATIVE mg/dL
SPECIFIC GRAVITY, URINE: 1.025 (ref 1.005–1.030)
Urobilinogen, UA: 0.2 mg/dL (ref 0.0–1.0)
pH: 6 (ref 5.0–8.0)

## 2013-05-25 LAB — WET PREP, GENITAL
Trich, Wet Prep: NONE SEEN
Yeast Wet Prep HPF POC: NONE SEEN

## 2013-05-25 LAB — POCT PREGNANCY, URINE: PREG TEST UR: NEGATIVE

## 2013-05-25 MED ORDER — OXYCODONE-ACETAMINOPHEN 5-325 MG PO TABS: 1.0000 | ORAL_TABLET | Freq: Four times a day (QID) | ORAL | Status: DC | PRN

## 2013-05-25 MED ORDER — KETOROLAC TROMETHAMINE 60 MG/2ML IM SOLN
60.0000 mg | INTRAMUSCULAR | Status: AC
  Administered 2013-05-25: 60 mg via INTRAMUSCULAR
  Filled 2013-05-25: qty 2

## 2013-05-25 MED ORDER — FLUCONAZOLE 100 MG PO TABS: 100.0000 mg | ORAL_TABLET | ORAL | Status: DC | PRN

## 2013-05-25 NOTE — MAU Note (Signed)
PT SAYS SHE WAS HERE THIS AM   FOR BACK PAIN-    REC INJECTION.-  NO RELIEF.  NOW TONIGHT  SHE HAS VAG D/C  THAT LOOKS LIKE COTTAGE  CHEESE.     USES CONDOMS FOR BC.  LAST SEX-    4-4.

## 2013-05-25 NOTE — Discharge Instructions (Signed)
Candida Infection, Adult A candida infection (also called yeast, fungus and Monilia infection) is an overgrowth of yeast that can occur anywhere on the body. A yeast infection commonly occurs in warm, moist body areas. Usually, the infection remains localized but can spread to become a systemic infection. A yeast infection may be a sign of a more severe disease such as diabetes, leukemia, or AIDS. A yeast infection can occur in both men and women. In women, Candida vaginitis is a vaginal infection. It is one of the most common causes of vaginitis. Men usually do not have symptoms or know they have an infection until other problems develop. Men may find out they have a yeast infection because their sex partner has a yeast infection. Uncircumcised men are more likely to get a yeast infection than circumcised men. This is because the uncircumcised glans is not exposed to air and does not remain as dry as that of a circumcised glans. Older adults may develop yeast infections around dentures. CAUSES  Women  Antibiotics.  Steroid medication taken for a long time.  Being overweight (obese).  Diabetes.  Poor immune condition.  Certain serious medical conditions.  Immune suppressive medications for organ transplant patients.  Chemotherapy.  Pregnancy.  Menstration.  Stress and fatigue.  Intravenous drug use.  Oral contraceptives.  Wearing tight-fitting clothes in the crotch area.  Catching it from a sex partner who has a yeast infection.  Spermicide.  Intravenous, urinary, or other catheters. Men  Catching it from a sex partner who has a yeast infection.  Having oral or anal sex with a person who has the infection.  Spermicide.  Diabetes.  Antibiotics.  Poor immune system.  Medications that suppress the immune system.  Intravenous drug use.  Intravenous, urinary, or other catheters. SYMPTOMS  Women  Thick, white vaginal discharge.  Vaginal itching.  Redness and  swelling in and around the vagina.  Irritation of the lips of the vagina and perineum.  Blisters on the vaginal lips and perineum.  Painful sexual intercourse.  Low blood sugar (hypoglycemia).  Painful urination.  Bladder infections.  Intestinal problems such as constipation, indigestion, bad breath, bloating, increase in gas, diarrhea, or loose stools. Men  Men may develop intestinal problems such as constipation, indigestion, bad breath, bloating, increase in gas, diarrhea, or loose stools.  Dry, cracked skin on the penis with itching or discomfort.  Jock itch.  Dry, flaky skin.  Athlete's foot.  Hypoglycemia. DIAGNOSIS  Women  A history and an exam are performed.  The discharge may be examined under a microscope.  A culture may be taken of the discharge. Men  A history and an exam are performed.  Any discharge from the penis or areas of cracked skin will be looked at under the microscope and cultured.  Stool samples may be cultured. TREATMENT  Women  Vaginal antifungal suppositories and creams.  Medicated creams to decrease irritation and itching on the outside of the vagina.  Warm compresses to the perineal area to decrease swelling and discomfort.  Oral antifungal medications.  Medicated vaginal suppositories or cream for repeated or recurrent infections.  Wash and dry the irritation areas before applying the cream.  Eating yogurt with lactobacillus may help with prevention and treatment.  Sometimes painting the vagina with gentian violet solution may help if creams and suppositories do not work. Men  Antifungal creams and oral antifungal medications.  Sometimes treatment must continue for 30 days after the symptoms go away to prevent recurrence. HOME CARE   INSTRUCTIONS  Women  Use cotton underwear and avoid tight-fitting clothing.  Avoid colored, scented toilet paper and deodorant tampons or pads.  Do not douche.  Keep your diabetes  under control.  Finish all the prescribed medications.  Keep your skin clean and dry.  Consume milk or yogurt with lactobacillus active culture regularly. If you get frequent yeast infections and think that is what the infection is, there are over-the-counter medications that you can get. If the infection does not show healing in 3 days, talk to your caregiver.  Tell your sex partner you have a yeast infection. Your partner may need treatment also, especially if your infection does not clear up or recurs. Men  Keep your skin clean and dry.  Keep your diabetes under control.  Finish all prescribed medications.  Tell your sex partner that you have a yeast infection so they can be treated if necessary. SEEK MEDICAL CARE IF:   Your symptoms do not clear up or worsen in one week after treatment.  You have an oral temperature above 102 F (38.9 C).  You have trouble swallowing or eating for a prolonged time.  You develop blisters on and around your vagina.  You develop vaginal bleeding and it is not your menstrual period.  You develop abdominal pain.  You develop intestinal problems as mentioned above.  You get weak or lightheaded.  You have painful or increased urination.  You have pain during sexual intercourse. MAKE SURE YOU:   Understand these instructions.  Will watch your condition.  Will get help right away if you are not doing well or get worse. Document Released: 03/05/2004 Document Revised: 04/20/2011 Document Reviewed: 06/17/2009 [facility] Patient Information 2014 [place].  Sexually Transmitted Disease A sexually transmitted disease (STD) is a disease or infection that may be passed (transmitted) from person to person, usually during sexual activity. This may happen by way of saliva, semen, blood, vaginal mucus, or urine. Common STDs include:   Gonorrhea.   Chlamydia.   Syphilis.   HIV and AIDS.   Genital herpes.   Hepatitis B and C.    Trichomonas.   Human papillomavirus (HPV).   Pubic lice.   Scabies.  Mites.  Bacterial vaginosis. WHAT ARE CAUSES OF STDs? An STD may be caused by bacteria, a virus, or parasites. STDs are often transmitted during sexual activity if one person is infected. However, they may also be transmitted through nonsexual means. STDs may be transmitted after:   Sexual intercourse with an infected person.   Sharing sex toys with an infected person.   Sharing needles with an infected person or using unclean piercing or tattoo needles.  Having intimate contact with the genitals, mouth, or rectal areas of an infected person.   Exposure to infected fluids during birth. WHAT ARE THE SIGNS AND SYMPTOMS OF STDs? Different STDs have different symptoms. Some people may not have any symptoms. If symptoms are present, they may include:   Painful or bloody urination.   Pain in the pelvis, abdomen, vagina, anus, throat, or eyes.   Skin rash, itching, irritation, growths, sores (lesions), ulcerations, or warts in the genital or anal area.  Abnormal vaginal discharge with or without bad odor.   Penile discharge in men.   Fever.   Pain or bleeding during sexual intercourse.   Swollen glands in the groin area.   Yellow skin and eyes (jaundice). This is seen with hepatitis.   Swollen testicles.  Infertility.  Sores and blisters in the mouth.  HOW ARE STDs DIAGNOSED? To make a diagnosis, your health care provider may:   Take a medical history.   Perform a physical exam.   Take a sample of any discharge for examination.  Swab the throat, cervix, opening to the penis, rectum, or vagina for testing.  Test a sample of your first morning urine.   Perform blood tests.   Perform a Pap smear, if this applies.   Perform a colposcopy.   Perform a laparoscopy.  HOW ARE STDs TREATED? Treatment depends on the STD. Some STDs may be treated but not cured.    Chlamydia, gonorrhea, trichomonas, and syphilis can be cured with antibiotics.   Genital herpes, hepatitis, and HIV can be treated, but not cured, with prescribed medicines. The medicines lessen symptoms.   Genital warts from HPV can be treated with medicine or by freezing, burning (electrocautery), or surgery. Warts may come back.   HPV cannot be cured with medicine or surgery. However, abnormal areas may be removed from the cervix, vagina, or vulva.   If your diagnosis is confirmed, your recent sexual partners need treatment. This is true even if they are symptom-free or have a negative culture or evaluation. They should not have sex until their health care providers say it is OK. HOW CAN I REDUCE MY RISK OF GETTING AN STD?  Use latex condoms, dental dams, and water-soluble lubricants during sexual activity. Do not use petroleum jelly or oils.  Get vaccinated for HPV and hepatitis. If you have not received these vaccines in the past, talk to your health care provider about whether one or both might be right for you.   Avoid risky sex practices that can break the skin.  WHAT SHOULD I DO IF I THINK I HAVE AN STD?  See your health care provider.   Inform all sexual partners. They should be tested and treated for any STDs.  Do not have sex until your health care provider says it is OK. WHEN SHOULD I GET HELP? Seek immediate medical care if:  You develop severe abdominal pain.  You are a man and notice swelling or pain in the testicles.  You are a woman and notice swelling or pain in your vagina. Document Released: 04/18/2002 Document Revised: 11/16/2012 Document Reviewed: 08/16/2012 [facility] Patient Information 2014 [place], [place].

## 2013-05-25 NOTE — MAU Note (Addendum)
Low back pain started 4-5 days ago.  Pain is constant.  Started when she bent down to tie her shoe and couldn't straighten.

## 2013-05-25 NOTE — Discharge Instructions (Signed)
Lumbosacral Strain Lumbosacral strain is a strain of any of the parts that make up your lumbosacral vertebrae. Your lumbosacral vertebrae are the bones that make up the lower third of your backbone. Your lumbosacral vertebrae are held together by muscles and tough, fibrous tissue (ligaments).  CAUSES  A sudden blow to your back can cause lumbosacral strain. Also, anything that causes an excessive stretch of the muscles in the low back can cause this strain. This is typically seen when people exert themselves strenuously, fall, lift heavy objects, bend, or crouch repeatedly. RISK FACTORS  Physically demanding work.  Participation in pushing or pulling sports or sports that require sudden twist of the back (tennis, golf, baseball).  Weight lifting.  Excessive lower back curvature.  Forward-tilted pelvis.  Weak back or abdominal muscles or both.  Tight hamstrings. SIGNS AND SYMPTOMS  Lumbosacral strain may cause pain in the area of your injury or pain that moves (radiates) down your leg.  DIAGNOSIS Your health care provider can often diagnose lumbosacral strain through a physical exam. In some cases, you may need tests such as X-ray exams.  TREATMENT  Treatment for your lower back injury depends on many factors that your clinician will have to evaluate. However, most treatment will include the use of anti-inflammatory medicines. HOME CARE INSTRUCTIONS   Avoid hard physical activities (tennis, racquetball, waterskiing) if you are not in proper physical condition for it. This may aggravate or create problems.  If you have a back problem, avoid sports requiring sudden body movements. Swimming and walking are generally safer activities.  Maintain good posture.  Maintain a healthy weight.  For acute conditions, you may put ice on the injured area.  Put ice in a plastic bag.  Place a towel between your skin and the bag.  Leave the ice on for 20 minutes, 2 3 times a day.  When the  low back starts healing, stretching and strengthening exercises may be recommended. SEEK MEDICAL CARE IF:  Your back pain is getting worse.  You experience severe back pain not relieved with medicines. SEEK IMMEDIATE MEDICAL CARE IF:   You have numbness, tingling, weakness, or problems with the use of your arms or legs.  There is a change in bowel or bladder control.  You have increasing pain in any area of the body, including your belly (abdomen).  You notice shortness of breath, dizziness, or feel faint.  You feel sick to your stomach (nauseous), are throwing up (vomiting), or become sweaty.  You notice discoloration of your toes or legs, or your feet get very cold. MAKE SURE YOU:   Understand these instructions.  Will watch your condition.  Will get help right away if you are not doing well or get worse. Document Released: 11/05/2004 Document Revised: 11/16/2012 Document Reviewed: 09/14/2012 [employer] Patient Information 2014 [employer].  

## 2013-05-25 NOTE — MAU Provider Note (Signed)
Chief Complaint: Back Pain   First Provider Initiated Contact with Patient 05/25/13 1131     SUBJECTIVE HPI: [name] is a 33 y.o. G0P0 who presents to maternity admissions reporting low back pain x5 days with sudden onset when she bent down to tie her shoe.  She reports the pain is severe, constant, and radiates into her right buttock and right leg making it difficult to walk. She has taken naproxen for her pain and it is not helping.  Her last dose was last night.  She denies LOF, vaginal bleeding, vaginal itching/burning, urinary symptoms, h/a, dizziness, n/v, or fever/chills.     Past Medical History  Diagnosis Date  . Ovarian cyst   . Viral labyrinthitis   . DENTAL PAIN   . IRREGULAR MENSTRUATION   . FEMALE INFERTILITY   . BOILS, RECURRENT   . SEPTATE UTERUS   . Endometriosis   . Breast tenderness in female    Past Surgical History  Procedure Laterality Date  . Ovarian cyst removal  Mar 25 2010  . Biopsy of right breast    . Breast biopsy  1999    right  . Ovarian cyst removal  03/25/2010    right   History   Social History  . Marital Status: Single    Spouse Name: N/A    Number of Children: N/A  . Years of Education: N/A   Occupational History  . Not on file.   Social History Main Topics  . Smoking status: Current Every Day Smoker -- 0.50 packs/day for 10 years    Types: Cigarettes  . Smokeless tobacco: Never Used  . Alcohol Use: Yes     Comment: occas.  . Drug Use: No  . Sexual Activity: Yes    Birth Control/ Protection: Condom   Other Topics Concern  . Not on file   Social History Narrative  . No narrative on file   No current facility-administered medications on file prior to encounter.   Current Outpatient Prescriptions on File Prior to Encounter  Medication Sig Dispense Refill  . [DISCONTINUED] Norgestimate-Ethinyl Estradiol Triphasic (TRI-SPRINTEC) 0.18/0.215/0.25 MG-35 MCG tablet Take 1 tablet by mouth daily.  1 Package  11   Allergies   Allergen Reactions  . Flagyl [Metronidazole] Nausea And Vomiting    ROS: Pertinent items in HPI  OBJECTIVE Blood pressure 115/76, pulse 97, temperature 98.6 F (37 C), temperature source Oral, resp. rate 18, height 5\' 1"  (1.549 m), weight 81.647 kg (180 lb), last menstrual period 05/04/2013. GENERAL: Well-developed, well-nourished female in no acute distress.  HEENT: Normocephalic HEART: normal rate RESP: normal effort ABDOMEN: Soft, non-tender Back exam - tenderness noted bilaterally low back, sacral tenderness, no tenderness noted over lumbar spine, normal reflexes and strength bilateral lower extremities, normal ROM Musculoskeletal - no joint tenderness, deformity or swelling EXTREMITIES: Nontender, no edema NEURO: Alert and oriented   LAB RESULTS Results for orders placed during the hospital encounter of 05/25/13 (from the past 24 hour(s))  URINALYSIS, ROUTINE W REFLEX MICROSCOPIC     Status: None   Collection Time    05/25/13 10:45 AM      Result Value Ref Range   Color, Urine YELLOW  YELLOW   APPearance CLEAR  CLEAR   Specific Gravity, Urine 1.025  1.005 - 1.030   pH 6.0  5.0 - 8.0   Glucose, UA NEGATIVE  NEGATIVE mg/dL   Hgb urine dipstick NEGATIVE  NEGATIVE   Bilirubin Urine NEGATIVE  NEGATIVE   Ketones, ur  NEGATIVE  NEGATIVE mg/dL   Protein, ur NEGATIVE  NEGATIVE mg/dL   Urobilinogen, UA 0.2  0.0 - 1.0 mg/dL   Nitrite NEGATIVE  NEGATIVE   Leukocytes, UA NEGATIVE  NEGATIVE  POCT PREGNANCY, URINE     Status: None   Collection Time    05/25/13 10:52 AM      Result Value Ref Range   Preg Test, Ur NEGATIVE  NEGATIVE    ASSESSMENT 1. Lumbosacral strain     PLAN Toradol 60 mg IM x1 dose in MAU Discharge home Percocet 5/325, take 1-2 every 6 hours PRN x15 tabs If symptoms persist or worsen, f/u with primary care provider or urgent care Return to MAU or ED as needed for emergencies    Medication List         clindamycin 300 MG capsule  Commonly known as:   CLEOCIN  300 mg 2 (two) times daily.     CULTURELLE DIGESTIVE HEALTH Caps  Take 1 capsule by mouth daily.     naproxen 500 MG tablet  Commonly known as:  NAPROSYN  Take 500 mg by mouth 2 (two) times daily as needed for moderate pain.     oxyCODONE-acetaminophen 5-325 MG per tablet  Commonly known as:  PERCOCET/ROXICET  Take 1-2 tablets by mouth every 6 (six) hours as needed.     trolamine salicylate 10 % cream  Commonly known as:  ASPERCREME  Apply 1 application topically 3 (three) times daily as needed for muscle pain.       Follow-up Information   Please follow up. (With your primary care provider or urgent care if symptoms worsen or do not improve.  Return to MAU or ED as needed for emergencies. )       Fatima Blank Certified Nurse-Midwife 05/25/2013  11:43 AM

## 2013-05-25 NOTE — MAU Provider Note (Signed)
Chief Complaint: No chief complaint on file.   First Provider Initiated Contact with Patient 05/25/13 2139      SUBJECTIVE HPI: [name] is a 33 y.o. G0P0 female who presents with curd-like vaginal discharge and continuing  LBP. Was seen in MAU this morning for the back pain. Dx w/ lumbosacral strain and instructed to F/U w/ PCP. Has percocet Rx.   Also concerned about STD exposure from oral sex. No Sx. Had been w/ current partner x 6 months.   Past Medical History  Diagnosis Date  . Ovarian cyst   . Viral labyrinthitis   . DENTAL PAIN   . IRREGULAR MENSTRUATION   . FEMALE INFERTILITY   . BOILS, RECURRENT   . SEPTATE UTERUS   . Endometriosis   . Breast tenderness in female    OB History  Gravida Para Term Preterm AB SAB TAB Ectopic Multiple Living  0         0       Past Surgical History  Procedure Laterality Date  . Ovarian cyst removal  Mar 25 2010  . Biopsy of right breast    . Breast biopsy  1999    right  . Ovarian cyst removal  03/25/2010    right   History   Social History  . Marital Status: Single    Spouse Name: N/A    Number of Children: N/A  . Years of Education: N/A   Occupational History  . Not on file.   Social History Main Topics  . Smoking status: Current Every Day Smoker -- 0.50 packs/day for 10 years    Types: Cigarettes  . Smokeless tobacco: Never Used  . Alcohol Use: Yes     Comment: occas.  . Drug Use: No  . Sexual Activity: Yes    Birth Control/ Protection: Condom   Other Topics Concern  . Not on file   Social History Narrative  . No narrative on file   No current facility-administered medications on file prior to encounter.   Current Outpatient Prescriptions on File Prior to Encounter  Medication Sig Dispense Refill  . clindamycin (CLEOCIN) 300 MG capsule 300 mg 2 (two) times daily.      . Lactobacillus-Inulin ([place]) CAPS Take 1 capsule by mouth daily.      . naproxen (NAPROSYN) 500 MG tablet Take  500 mg by mouth 2 (two) times daily as needed for moderate pain.      Marland Kitchen oxyCODONE-acetaminophen (PERCOCET/ROXICET) 5-325 MG per tablet Take 1-2 tablets by mouth every 6 (six) hours as needed.  15 tablet  0  . trolamine salicylate (ASPERCREME) 10 % cream Apply 1 application topically 3 (three) times daily as needed for muscle pain.      . [DISCONTINUED] Norgestimate-Ethinyl Estradiol Triphasic (TRI-SPRINTEC) 0.18/0.215/0.25 MG-35 MCG tablet Take 1 tablet by mouth daily.  1 Package  11   Allergies  Allergen Reactions  . Flagyl [Metronidazole] Nausea And Vomiting    ROS: Pertinent items in HPI  OBJECTIVE Blood pressure 99/66, pulse 93, temperature 98.7 F (37.1 C), temperature source Oral, resp. rate 18, height 5' (1.524 m), weight 81.194 kg (179 lb), last menstrual period 05/04/2013. GENERAL: Well-developed, well-nourished female in no acute distress.  HEENT: Normocephalic. Throat clear. No erythema, exudate or lesions.  HEART: normal rate RESP: normal effort ABDOMEN: Soft, non-tender EXTREMITIES: Nontender, no edema NEURO: Alert and oriented SPECULUM EXAM: NEFG, large amount of thick, curd-like, mildly malodorous white discharge, no blood noted, cervix clean BIMANUAL:  cervix closed; uterus normal size, no adnexal tenderness or masses  LAB RESULTS Results for orders placed during the hospital encounter of 05/25/13 (from the past 168 hour(s))  WET PREP, GENITAL   Collection Time    05/25/13  9:40 PM      Result Value Ref Range   Yeast Wet Prep HPF POC NONE SEEN  NONE SEEN   Trich, Wet Prep NONE SEEN  NONE SEEN   Clue Cells Wet Prep HPF POC FEW (*) NONE SEEN   WBC, Wet Prep HPF POC FEW (*) NONE SEEN  URINALYSIS, ROUTINE W REFLEX MICROSCOPIC   Collection Time    05/25/13 10:45 AM      Result Value Ref Range   Color, Urine YELLOW  YELLOW   APPearance CLEAR  CLEAR   Specific Gravity, Urine 1.025  1.005 - 1.030   pH 6.0  5.0 - 8.0   Glucose, UA NEGATIVE  NEGATIVE mg/dL   Hgb urine  dipstick NEGATIVE  NEGATIVE   Bilirubin Urine NEGATIVE  NEGATIVE   Ketones, ur NEGATIVE  NEGATIVE mg/dL   Protein, ur NEGATIVE  NEGATIVE mg/dL   Urobilinogen, UA 0.2  0.0 - 1.0 mg/dL   Nitrite NEGATIVE  NEGATIVE   Leukocytes, UA NEGATIVE  NEGATIVE  POCT PREGNANCY, URINE   Collection Time    05/25/13 10:52 AM      Result Value Ref Range   Preg Test, Ur NEGATIVE  NEGATIVE   IMAGING No results found.  MAU COURSE  ASSESSMENT 1. Vaginal yeast infection   2. Possible exposure to STD    PLAN Discharge home in stable condition. Encouraged complete STD testing at [facility] or Health Dept.  Encouraged safe sex practices.  GC/CT genital and oral cultures pending.      Follow-up Information   Follow up with [facility]. (As needed if symptoms continue and for routine care. )    Contact information:   [place] [address] [phone]       Follow up with [facility]. (for primary care)    Specialty:  Family Medicine   Contact information:   [address] [id number] [place] [address] [phone]       Medication List         clindamycin 300 MG capsule  Commonly known as:  CLEOCIN  300 mg 2 (two) times daily.     CULTURELLE DIGESTIVE HEALTH Caps  Take 1 capsule by mouth daily.     fluconazole 100 MG tablet  Commonly known as:  DIFLUCAN  Take 1 tablet (100 mg total) by mouth every three (3) days as needed (if no relief after first dose).     naproxen 500 MG tablet  Commonly known as:  NAPROSYN  Take 500 mg by mouth 2 (two) times daily as needed for moderate pain.     oxyCODONE-acetaminophen 5-325 MG per tablet  Commonly known as:  PERCOCET/ROXICET  Take 1-2 tablets by mouth every 6 (six) hours as needed.     trolamine salicylate 10 % cream  Commonly known as:  ASPERCREME  Apply 1 application topically 3 (three) times daily as needed for muscle pain.       [place], [place] 05/25/2013  10:32  PM

## 2013-05-25 NOTE — MAU Provider Note (Signed)
Attestation of Attending Supervision of Advanced Practitioner (PA/CNM/NP): Evaluation and management procedures were performed by the Advanced Practitioner under my supervision and collaboration.  I have reviewed the Advanced Practitioner's note and chart, and I agree with the management and plan.  Colonel Krauser, MD, FACOG Attending Obstetrician & Gynecologist Faculty Practice, [facility]  

## 2013-05-26 LAB — URINE CULTURE

## 2013-05-26 LAB — GC/CHLAMYDIA PROBE AMP
CT PROBE, AMP APTIMA: NEGATIVE
CT PROBE, AMP APTIMA: NEGATIVE
GC Probe RNA: NEGATIVE
GC Probe RNA: NEGATIVE

## 2013-05-29 NOTE — MAU Provider Note (Signed)
Attestation of Attending Supervision of Advanced Practitioner (PA/CNM/NP): Evaluation and management procedures were performed by the Advanced Practitioner under my supervision and collaboration.  I have reviewed the Advanced Practitioner's note and chart, and I agree with the management and plan.  Annetta Deiss, MD, FACOG Attending Obstetrician & Gynecologist Faculty Practice, [facility]  

## 2013-06-10 ENCOUNTER — Encounter (HOSPITAL_COMMUNITY): Payer: Self-pay | Admitting: Emergency Medicine

## 2013-06-10 ENCOUNTER — Emergency Department (HOSPITAL_COMMUNITY)
Admission: EM | Admit: 2013-06-10 | Discharge: 2013-06-10 | Disposition: A | Discharge status: Home / Self Care | Payer: No Typology Code available for payment source | Attending: Emergency Medicine | Admitting: Emergency Medicine

## 2013-06-10 DIAGNOSIS — F17200 Nicotine dependence, unspecified, uncomplicated: Secondary | ICD-10-CM | POA: Insufficient documentation

## 2013-06-10 DIAGNOSIS — Z792 Long term (current) use of antibiotics: Secondary | ICD-10-CM | POA: Insufficient documentation

## 2013-06-10 DIAGNOSIS — Z79899 Other long term (current) drug therapy: Secondary | ICD-10-CM | POA: Insufficient documentation

## 2013-06-10 DIAGNOSIS — IMO0002 Reserved for concepts with insufficient information to code with codable children: Secondary | ICD-10-CM | POA: Insufficient documentation

## 2013-06-10 DIAGNOSIS — Z8669 Personal history of other diseases of the nervous system and sense organs: Secondary | ICD-10-CM | POA: Insufficient documentation

## 2013-06-10 DIAGNOSIS — Z8742 Personal history of other diseases of the female genital tract: Secondary | ICD-10-CM | POA: Insufficient documentation

## 2013-06-10 DIAGNOSIS — Z87718 Personal history of other specified (corrected) congenital malformations of genitourinary system: Secondary | ICD-10-CM | POA: Insufficient documentation

## 2013-06-10 DIAGNOSIS — Z8719 Personal history of other diseases of the digestive system: Secondary | ICD-10-CM | POA: Insufficient documentation

## 2013-06-10 DIAGNOSIS — L0291 Cutaneous abscess, unspecified: Secondary | ICD-10-CM

## 2013-06-10 MED ORDER — HYDROCODONE-ACETAMINOPHEN 5-325 MG PO TABS: 1.0000 | ORAL_TABLET | ORAL | Status: DC | PRN

## 2013-06-10 MED ORDER — MORPHINE SULFATE 4 MG/ML IJ SOLN
4.0000 mg | Freq: Once | INTRAMUSCULAR | Status: AC
  Administered 2013-06-10: 4 mg via INTRAMUSCULAR
  Filled 2013-06-10: qty 1

## 2013-06-10 MED ORDER — IBUPROFEN 800 MG PO TABS: 800.0000 mg | ORAL_TABLET | Freq: Three times a day (TID) | ORAL | Status: DC

## 2013-06-10 NOTE — ED Provider Notes (Signed)
CSN: [id number]     Arrival date & time 06/10/13  0725 History   First MD Initiated Contact with Patient 06/10/13 [phone]     Chief Complaint  Patient presents with  . Abscess     (Consider location/radiation/quality/duration/timing/severity/associated sxs/prior Treatment) Patient is a 33 y.o. female presenting with abscess. The history is provided by the patient. No language interpreter was used.  Abscess Location:  Torso Torso abscess location:  R axilla Abscess quality: painful and redness   Red streaking: no   Duration:  3 days Progression:  Worsening Pain details:    Quality:  Pressure, dull and sharp   Severity:  Severe   Duration:  3 days   Timing:  Constant   Progression:  Worsening Chronicity:  Recurrent Context: not diabetes and not immunosuppression   Relieved by:  Nothing Ineffective treatments:  Tar ointment Associated symptoms: no fever, no nausea and no vomiting   Risk factors: prior abscess     Past Medical History  Diagnosis Date  . Ovarian cyst   . Viral labyrinthitis   . DENTAL PAIN   . IRREGULAR MENSTRUATION   . FEMALE INFERTILITY   . BOILS, RECURRENT   . SEPTATE UTERUS   . Endometriosis   . Breast tenderness in female    Past Surgical History  Procedure Laterality Date  . Ovarian cyst removal  Mar 25 2010  . Biopsy of right breast    . Breast biopsy  1999    right  . Ovarian cyst removal  03/25/2010    right   Family History  Problem Relation Age of Onset  . Hypertension Mother   . Diabetes Mother   . Cancer Father   . Hypertension Father   . Diabetes Brother   . Hypertension Brother    History  Substance Use Topics  . Smoking status: Current Every Day Smoker -- 0.50 packs/day for 10 years    Types: Cigarettes  . Smokeless tobacco: Never Used  . Alcohol Use: Yes     Comment: occas.   OB History   Grav Para Term Preterm Abortions TAB SAB Ect Mult Living   0         0     Review of Systems  Constitutional: Negative for fever  and chills.  Gastrointestinal: Negative for nausea and vomiting.  Skin: Positive for color change and wound.  All other systems reviewed and are negative.     Allergies  Flagyl  Home Medications   Prior to Admission medications   Medication Sig Start Date End Date Taking? Authorizing Provider  clindamycin (CLEOCIN) 300 MG capsule 300 mg 2 (two) times daily. 04/30/13  Yes Harden Mo, MD  Lactobacillus-Inulin ([place]) CAPS Take 1 capsule by mouth daily.   Yes Historical Provider, MD  naproxen (NAPROSYN) 500 MG tablet Take 500 mg by mouth 2 (two) times daily as needed for moderate pain.   Yes Historical Provider, MD  oxyCODONE-acetaminophen (PERCOCET/ROXICET) 5-325 MG per tablet Take 1-2 tablets by mouth every 6 (six) hours as needed. 05/25/13   Kathie Dike [place], CNM   BP 118/87  Pulse 104  Temp(Src) 97.8 F (36.6 C) (Oral)  Resp 20  SpO2 100%  [name] 05/04/2013 Physical Exam  Nursing note and vitals reviewed. Constitutional: She is oriented to person, place, and time. She appears well-developed and well-nourished. No distress.  HENT:  Head: Normocephalic and atraumatic.  Neck: Normal range of motion. Neck supple.  Pulmonary/Chest: Effort normal. No respiratory distress.  Musculoskeletal: Normal range of motion.  Neurological: She is alert and oriented to person, place, and time.  Skin: Skin is warm and dry. She is not diaphoretic.  Right Axilla: 6x5cm area of induration with fluctuant center center, associated overlying erythremia. Moderately tender to palpation. No drainage noted.  Psychiatric: She has a normal mood and affect. Her behavior is normal.    ED Course  INCISION AND DRAINAGE Date/Time: 06/10/2013 8:48 AM Performed by: Lorrine Kin Authorized by: Lorrine Kin Consent: Verbal consent obtained. Risks and benefits: risks, benefits and alternatives were discussed Consent given by: patient Patient understanding: patient states  understanding of the procedure being performed Patient consent: the patient's understanding of the procedure matches consent given Required items: required blood products, implants, devices, and special equipment available Patient identity confirmed: verbally with patient and arm band Time out: Immediately prior to procedure a "time out" was called to verify the correct patient, procedure, equipment, support staff and site/side marked as required. Type: abscess Body area: upper extremity (Right axilla) Anesthesia: local infiltration Local anesthetic: lidocaine 2% with epinephrine Anesthetic total: 8 ml Patient sedated: no Scalpel size: 11 Needle gauge: 22 Incision type: single straight Complexity: complex Drainage: purulent Drainage amount: copious Wound treatment: drain placed and  wound left open Packing material: 1/4 in iodoform gauze Patient tolerance: Patient tolerated the procedure well with no immediate complications.   (including critical care time) Labs Review Labs Reviewed - No data to display  Imaging Review No results found.   EKG Interpretation None      MDM   Final diagnoses:  Abscess   Patient with history of abscess with an abscess Right axilla. I&D performed, pt tolerated well, packing placed.  Wound check in 48 hours.  No obvious cellulitis, no history of immunocompromise. Discussed treatment plan with the patient. Return precautions given. Reports understanding and no other concerns at this time.  Patient is stable for discharge at this time.  Meds given in ED:  Medications  morphine 4 MG/ML injection 4 mg (4 mg Intramuscular Given 06/10/13 0800)    New Prescriptions   HYDROCODONE-ACETAMINOPHEN (NORCO/VICODIN) 5-325 MG PER TABLET    Take 1 tablet by mouth every 4 (four) hours as needed.   IBUPROFEN (ADVIL,MOTRIN) 800 MG TABLET    Take 1 tablet (800 mg total) by mouth 3 (three) times daily. Take with food      Lorrine Kin, PA-C 06/11/13 1642

## 2013-06-10 NOTE — ED Notes (Signed)
Abscess under rt. Arm pit.  Area is red and swollen. Pt. Is tearful

## 2013-06-10 NOTE — Discharge Instructions (Signed)
Return to the ED or call your Primary Care Provider for a wound check in 48 hours. Call for a follow up appointment with a Family or Primary Care Provider.  Return if Symptoms worsen.   Take medication as prescribed.  Don't soak the underarm, leave the packing in until your wound check in 48 hours.  Followup with your doctor or an urgent care in order to remove your packing in 48-72 hours. You may return to the emergency department if you have  a fever that persists greater than 101 or your abscess appears to become infected (growing surrounding redness and warmth). Do not operate any heavy machinery while on pain medications. Do not consume alcohol on these medications either.  Abscess An abscess (boil or furuncle) is an infected area that contains a collection of pus.  SYMPTOMS Signs and symptoms of an abscess include pain, tenderness, redness, or hardness. You may feel a moveable soft area under your skin. An abscess can occur anywhere in the body.  TREATMENT  A surgical cut (incision) may be made over your abscess to drain the pus. Gauze may be packed into the space or a drain may be looped through the abscess cavity (pocket). This provides a drain that will allow the cavity to heal from the inside outwards. The abscess may be painful for a few days, but should feel much better if it was drained.  Your abscess, if seen early, may not have localized and may not have been drained. If not, another appointment may be required if it does not get better on its own or with medications. HOME CARE INSTRUCTIONS   Only take over-the-counter or prescription medicines for pain, discomfort, or fever as directed by your caregiver.   Take your antibiotics as directed if they were prescribed. Finish them even if you start to feel better.   Keep the skin and clothes clean around your abscess.   If the abscess was drained, you will need to use gauze dressing to collect any draining pus. Dressings will  typically need to be changed 3 or more times a day.   The infection may spread by skin contact with others. Avoid skin contact as much as possible.   Practice good hygiene. This includes regular hand washing, cover any draining skin lesions, and do not share personal care items.   If you participate in sports, do not share athletic equipment, towels, whirlpools, or personal care items. Shower after every practice or tournament.   If a draining area cannot be adequately covered:   Do not participate in sports.   Children should not participate in day care until the wound has healed or drainage stops.   If your caregiver has given you a follow-up appointment, it is very important to keep that appointment. Not keeping the appointment could result in a much worse infection, chronic or permanent injury, pain, and disability. If there is any problem keeping the appointment, you must call back to this facility for assistance.  SEEK MEDICAL CARE IF:   You develop increased pain, swelling, redness, drainage, or bleeding in the wound site.   You develop signs of generalized infection including muscle aches, chills, fever, or a general ill feeling.   You have an oral temperature above 102 F (38.9 C).  MAKE SURE YOU:   Understand these instructions.   Will watch your condition.   Will get help right away if you are not doing well or get worse.  Document Released: 11/05/2004 Document Revised: 10/08/2010  Document Reviewed: 08/30/2007 [facility] Patient Information 2012 [place].

## 2013-06-12 ENCOUNTER — Encounter (HOSPITAL_COMMUNITY): Payer: Self-pay | Admitting: Emergency Medicine

## 2013-06-12 ENCOUNTER — Telehealth (HOSPITAL_COMMUNITY): Payer: Self-pay

## 2013-06-12 ENCOUNTER — Emergency Department (HOSPITAL_COMMUNITY)
Admission: EM | Admit: 2013-06-12 | Discharge: 2013-06-12 | Disposition: A | Discharge status: Home / Self Care | Payer: No Typology Code available for payment source | Attending: Emergency Medicine | Admitting: Emergency Medicine

## 2013-06-12 DIAGNOSIS — Q512 Other doubling of uterus, unspecified: Secondary | ICD-10-CM

## 2013-06-12 DIAGNOSIS — F17200 Nicotine dependence, unspecified, uncomplicated: Secondary | ICD-10-CM | POA: Insufficient documentation

## 2013-06-12 DIAGNOSIS — Z4800 Encounter for change or removal of nonsurgical wound dressing: Secondary | ICD-10-CM

## 2013-06-12 DIAGNOSIS — Z872 Personal history of diseases of the skin and subcutaneous tissue: Secondary | ICD-10-CM | POA: Insufficient documentation

## 2013-06-12 DIAGNOSIS — Z8719 Personal history of other diseases of the digestive system: Secondary | ICD-10-CM | POA: Insufficient documentation

## 2013-06-12 DIAGNOSIS — Z791 Long term (current) use of non-steroidal anti-inflammatories (NSAID): Secondary | ICD-10-CM | POA: Insufficient documentation

## 2013-06-12 DIAGNOSIS — Z4802 Encounter for removal of sutures: Secondary | ICD-10-CM | POA: Insufficient documentation

## 2013-06-12 DIAGNOSIS — Z5189 Encounter for other specified aftercare: Secondary | ICD-10-CM

## 2013-06-12 DIAGNOSIS — Z8669 Personal history of other diseases of the nervous system and sense organs: Secondary | ICD-10-CM | POA: Insufficient documentation

## 2013-06-12 DIAGNOSIS — Q5128 Other doubling of uterus, other specified: Secondary | ICD-10-CM | POA: Insufficient documentation

## 2013-06-12 DIAGNOSIS — Z8742 Personal history of other diseases of the female genital tract: Secondary | ICD-10-CM | POA: Insufficient documentation

## 2013-06-12 NOTE — ED Notes (Signed)
Pt states she had an I&D on Sat. Pt had packing placed and was told to return for packing removal. Pt has an area under her right arm that has packing and a dressing. Pt states slightly tender to touch.

## 2013-06-12 NOTE — Discharge Instructions (Signed)
Apply warm compresses intermittently throughout the day.  Wound Check Your wound appears healthy today. Your wound will heal gradually over time. Eventually a scar will form that will fade with time. FACTORS THAT AFFECT SCAR FORMATION:  People differ in the severity in which they scar.  Scar severity varies according to location, size, and the traits you inherited from your parents (genetic predisposition).  Irritation to the wound from infection, rubbing, or chemical exposure will increase the amount of scar formation. HOME CARE INSTRUCTIONS   If you were given a dressing, you should change it at least once a day or as instructed by your caregiver. If the bandage sticks, soak it off with a solution of hydrogen peroxide.  If the bandage becomes wet, dirty, or develops a bad smell, change it as soon as possible.  Look for signs of infection.  Only take over-the-counter or prescription medicines for pain, discomfort, or fever as directed by your caregiver. SEEK IMMEDIATE MEDICAL CARE IF:   You have redness, swelling, or increasing pain in the wound.  You notice pus coming from the wound.  You have a fever.  You notice a bad smell coming from the wound or dressing. Document Released: 11/02/2003 Document Revised: 04/20/2011 Document Reviewed: 01/26/2005 [facility] Patient Information 2014 [place].

## 2013-06-12 NOTE — Telephone Encounter (Signed)
Called patient and informed her about her GC/CT test results which were negative.

## 2013-06-12 NOTE — ED Provider Notes (Signed)
CSN: [id number]     Arrival date & time 06/12/13  8144 History   First MD Initiated Contact with Patient 06/12/13 [phone]     Chief Complaint  Patient presents with  . Wound Check     (Consider location/radiation/quality/duration/timing/severity/associated sxs/prior Treatment) HPI Comments: 33 y/o female presents to the ED for packing removal from an abscess in her right axilla packed 2 days ago. States it has greatly improved and she has not had any issues, no drainage, fever or chills. Pt reports she has another small abscess developing under her left axilla that she noticed yesterday. No drainage.   Patient is a 33 y.o. female presenting with wound check. The history is provided by the patient.  Wound Check    Past Medical History  Diagnosis Date  . Ovarian cyst   . Viral labyrinthitis   . DENTAL PAIN   . IRREGULAR MENSTRUATION   . FEMALE INFERTILITY   . BOILS, RECURRENT   . SEPTATE UTERUS   . Endometriosis   . Breast tenderness in female    Past Surgical History  Procedure Laterality Date  . Ovarian cyst removal  Mar 25 2010  . Biopsy of right breast    . Breast biopsy  1999    right  . Ovarian cyst removal  03/25/2010    right   Family History  Problem Relation Age of Onset  . Hypertension Mother   . Diabetes Mother   . Cancer Father   . Hypertension Father   . Diabetes Brother   . Hypertension Brother    History  Substance Use Topics  . Smoking status: Current Every Day Smoker -- 0.50 packs/day for 10 years    Types: Cigarettes  . Smokeless tobacco: Never Used  . Alcohol Use: Yes     Comment: occas.   OB History   Grav Para Term Preterm Abortions TAB SAB Ect Mult Living   0         0     Review of Systems  Skin: Positive for wound (abscess).  All other systems reviewed and are negative.     Allergies  Flagyl  Home Medications   Prior to Admission medications   Medication Sig Start Date End Date Taking? Authorizing Provider  ibuprofen  (ADVIL,MOTRIN) 800 MG tablet Take 1 tablet (800 mg total) by mouth 3 (three) times daily. Take with food 06/10/13  Yes Lauren Burnetta Sabin, PA-C   BP 108/68  Pulse 77  Temp(Src) 97.8 F (36.6 C) (Oral)  Resp 20  Ht 5\' 2"  (1.575 m)  Wt 180 lb (81.647 kg)  BMI 32.91 kg/m2  SpO2 100%  [name] 05/04/2013 Physical Exam  Nursing note and vitals reviewed. Constitutional: She is oriented to person, place, and time. She appears well-developed and well-nourished. No distress.  HENT:  Head: Normocephalic and atraumatic.  Mouth/Throat: Oropharynx is clear and moist.  Eyes: Conjunctivae and EOM are normal.  Neck: Normal range of motion. Neck supple.  Cardiovascular: Normal rate, regular rhythm and normal heart sounds.   Pulmonary/Chest: Effort normal and breath sounds normal. No respiratory distress.  Musculoskeletal: Normal range of motion. She exhibits no edema.  Neurological: She is alert and oriented to person, place, and time. No sensory deficit.  Skin: Skin is warm and dry.  1 cm opening in right axilla with packing in place. No induration, fluctuance, erythema or drainage. Tiny 2 mm area of induration. No fluctuance, drainage, erythema. Mild tenderness.   Psychiatric: She has a normal mood and  affect. Her behavior is normal.    ED Course  Procedures (including critical care time) Labs Review Labs Reviewed - No data to display  Imaging Review No results found.   EKG Interpretation None      MDM   Final diagnoses:  Wound check, abscess  Abscess packing removal   Packing removed. No signs of infection. Regarding left axilla area, very tiny, no drainage at this time. Advised warm compresses intermittently throughout the day. Resources for PCP given. Stable for d/c. Return precautions given. Patient states understanding of treatment care plan and is agreeable.     Illene Labrador, PA-C 06/12/13 [phone]

## 2013-06-12 NOTE — ED Notes (Signed)
Here to have R axilla packing removed.

## 2013-06-13 NOTE — ED Provider Notes (Signed)
Medical screening examination/treatment/procedure(s) were performed by non-physician practitioner and as supervising physician I was immediately available for consultation/collaboration.   EKG Interpretation None       Teressa Lower, MD 06/13/13 [phone]

## 2013-06-13 NOTE — ED Provider Notes (Signed)
Medical screening examination/treatment/procedure(s) were performed by non-physician practitioner and as supervising physician I was immediately available for consultation/collaboration.   EKG Interpretation None       Shellie Goettl R. Krikor Willet, MD 06/13/13 1208 

## 2013-06-16 NOTE — ED Notes (Signed)
Medical screening examination/treatment/procedure(s) were conducted as a shared visit with non-physician practitioner(s) or resident and myself. I personally evaluated the patient during the encounter and agree with the findings and plan unless otherwise indicated.  I have personally reviewed any xrays and/ or EKG's with the provider and I agree with interpretation.  Right axilla packing placed 2 days prior. Patient here for packing removal. Patient denies fevers chills, spreading redness or other concerns. On exam mild tenderness to site without surrounding induration or erythema. Packing removed by PA. No concerns at this time and followup discussed.  Abscess, packing removal   [name]   Kerri Clonts, MD 06/16/13 [phone]

## 2013-06-24 ENCOUNTER — Emergency Department (INDEPENDENT_AMBULATORY_CARE_PROVIDER_SITE_OTHER)
Admission: EM | Admit: 2013-06-24 | Discharge: 2013-06-24 | Disposition: A | Discharge status: Home / Self Care | Payer: No Typology Code available for payment source | Source: Home / Self Care | Attending: Emergency Medicine | Admitting: Emergency Medicine

## 2013-06-24 ENCOUNTER — Encounter (HOSPITAL_COMMUNITY): Payer: Self-pay | Admitting: Emergency Medicine

## 2013-06-24 ENCOUNTER — Other Ambulatory Visit (HOSPITAL_COMMUNITY)
Admission: RE | Admit: 2013-06-24 | Discharge: 2013-06-24 | Disposition: A | Discharge status: Home / Self Care | Payer: No Typology Code available for payment source | Source: Ambulatory Visit | Attending: Emergency Medicine | Admitting: Emergency Medicine

## 2013-06-24 DIAGNOSIS — J029 Acute pharyngitis, unspecified: Secondary | ICD-10-CM

## 2013-06-24 DIAGNOSIS — Z113 Encounter for screening for infections with a predominantly sexual mode of transmission: Secondary | ICD-10-CM | POA: Insufficient documentation

## 2013-06-24 LAB — POCT RAPID STREP A: STREPTOCOCCUS, GROUP A SCREEN (DIRECT): NEGATIVE

## 2013-06-24 LAB — HIV ANTIBODY (ROUTINE TESTING W REFLEX): HIV 1&2 Ab, 4th Generation: NONREACTIVE

## 2013-06-24 MED ORDER — LIDOCAINE HCL (PF) 1 % IJ SOLN
INTRAMUSCULAR | Status: AC
  Filled 2013-06-24: qty 5

## 2013-06-24 MED ORDER — CEFTRIAXONE SODIUM 250 MG IJ SOLR
INTRAMUSCULAR | Status: AC
  Filled 2013-06-24: qty 250

## 2013-06-24 MED ORDER — CEFTRIAXONE SODIUM 250 MG IJ SOLR
250.0000 mg | Freq: Once | INTRAMUSCULAR | Status: AC
  Administered 2013-06-24: 250 mg via INTRAMUSCULAR

## 2013-06-24 MED ORDER — AZITHROMYCIN 250 MG PO TABS
1000.0000 mg | ORAL_TABLET | Freq: Once | ORAL | Status: AC
  Administered 2013-06-24: 1000 mg via ORAL

## 2013-06-24 MED ORDER — AZITHROMYCIN 250 MG PO TABS
ORAL_TABLET | ORAL | Status: AC
  Filled 2013-06-24: qty 4

## 2013-06-24 MED ORDER — PREDNISONE 20 MG PO TABS: 20.0000 mg | ORAL_TABLET | Freq: Two times a day (BID) | ORAL | Status: DC

## 2013-06-24 NOTE — Discharge Instructions (Signed)
Pharyngitis °Pharyngitis is redness, pain, and swelling (inflammation) of your pharynx.  °CAUSES  °Pharyngitis is usually caused by infection. Most of the time, these infections are from viruses (viral) and are part of a cold. However, sometimes pharyngitis is caused by bacteria (bacterial). Pharyngitis can also be caused by allergies. Viral pharyngitis may be spread from person to person by coughing, sneezing, and personal items or utensils (cups, forks, spoons, toothbrushes). Bacterial pharyngitis may be spread from person to person by more intimate contact, such as kissing.  °SIGNS AND SYMPTOMS  °Symptoms of pharyngitis include:   °· Sore throat.   °· Tiredness (fatigue).   °· Low-grade fever.   °· Headache. °· Joint pain and muscle aches. °· Skin rashes. °· Swollen lymph nodes. °· Plaque-like film on throat or tonsils (often seen with bacterial pharyngitis). °DIAGNOSIS  °Your health care provider will ask you questions about your illness and your symptoms. Your medical history, along with a physical exam, is often all that is needed to diagnose pharyngitis. Sometimes, a rapid strep test is done. Other lab tests may also be done, depending on the suspected cause.  °TREATMENT  °Viral pharyngitis will usually get better in 3 4 days without the use of medicine. Bacterial pharyngitis is treated with medicines that kill germs (antibiotics).  °HOME CARE INSTRUCTIONS  °· Drink enough water and fluids to keep your urine clear or pale yellow.   °· Only take over-the-counter or prescription medicines as directed by your health care provider:   °· If you are prescribed antibiotics, make sure you finish them even if you start to feel better.   °· Do not take aspirin.   °· Get lots of rest.   °· Gargle with 8 oz of salt water (½ tsp of salt per 1 qt of water) as often as every 1 2 hours to soothe your throat.   °· Throat lozenges (if you are not at risk for choking) or sprays may be used to soothe your throat. °SEEK MEDICAL  CARE IF:  °· You have large, tender lumps in your neck. °· You have a rash. °· You cough up green, yellow-brown, or bloody spit. °SEEK IMMEDIATE MEDICAL CARE IF:  °· Your neck becomes stiff. °· You drool or are unable to swallow liquids. °· You vomit or are unable to keep medicines or liquids down. °· You have severe pain that does not go away with the use of recommended medicines. °· You have trouble breathing (not caused by a stuffy nose). °MAKE SURE YOU:  °· Understand these instructions. °· Will watch your condition. °· Will get help right away if you are not doing well or get worse. °Document Released: 01/26/2005 Document Revised: 11/16/2012 Document Reviewed: 10/03/2012 °[employer]® Patient Information ©2014 [employer]. ° °

## 2013-06-24 NOTE — ED Notes (Signed)
Sore throat the past 5 days without fever, cough  or nasal/sinus c/o

## 2013-06-24 NOTE — ED Provider Notes (Signed)
Chief Complaint   Chief Complaint  Patient presents with  . Sore Throat    History of Present Illness   [name] is a 33 year old female who's had a five-day history of sore throat and headache. She denies any fever, chills, nasal congestion, rhinorrhea, swollen glands, cough, or GI symptoms. She's been on clindamycin for bacterial vaginosis for the past 2 days. She does have a concern about GC pharyngitis.   Review of Systems   Other than as noted above, the patient denies any of the following symptoms. Systemic:  No fever, chills, sweats, myalgias, or headache. Eye:  No redness, pain or drainage. ENT:  No earache, nasal congestion, sneezing, rhinorrhea, sinus pressure, sinus pain, or post nasal drip. Lungs:  No cough, sputum production, wheezing, shortness of breath, or chest pain. GI:  No abdominal pain, nausea, vomiting, or diarrhea. Skin:  No rash.  [place]   Past medical history, family history, social history, meds, and allergies were reviewed.   Physical Exam     Vital signs:  BP 113/76  Pulse 72  Temp(Src) 98.3 F (36.8 C) (Oral)  Resp 16  SpO2 100%  [name] 06/21/2013 General:  Alert, in no distress. Phonation was normal, no drooling, and patient was able to handle secretions well.  Eye:  No conjunctival injection or drainage. Lids were normal. ENT:  TMs and canals were normal, without erythema or inflammation.  Nasal mucosa was clear and uncongested, without drainage.  Mucous membranes were moist.  Exam of pharynx reveals erythema and swelling, no exudate.  There were no oral ulcerations or lesions. There was no bulging of the tonsillar pillars, and the uvula was midline. Neck:  Supple, no adenopathy, tenderness or mass. Lungs:  No respiratory distress.  Lungs were clear to auscultation, without wheezes, rales or rhonchi.  Breath sounds were clear and equal bilaterally.  Heart:  Regular rhythm, without gallops, murmers or rubs. Skin:  Clear, warm, and dry, without  rash or lesions.   Labs   Results for orders placed during the hospital encounter of 06/24/13  HIV ANTIBODY (ROUTINE TESTING)      Result Value Ref Range   HIV 1&2 Ab, 4th Generation NONREACTIVE  NONREACTIVE  POCT RAPID STREP A ([facility]RG CARE ONLY)      Result Value Ref Range   Streptococcus, Group A Screen (Direct) NEGATIVE  NEGATIVE   Strep culture and DNA probes for gonorrhea and Chlamydia were also obtained.  Course in Urgent [place]   The patient was given the following meds: Medications  cefTRIAXone (ROCEPHIN) injection 250 mg (250 mg Intramuscular Given 06/24/13 1305)  azithromycin (ZITHROMAX) tablet 1,000 mg (1,000 mg Oral Given 06/24/13 1306)   She requested treatment for gonococcal pharyngitis, so the above medicines were given.  Assessment   The encounter diagnosis was Pharyngitis.  There is no evidence of a peritonsillar abscess, retropharyngeal abscess, or epiglottitis.    Plan     1.  Meds:  The following meds were prescribed:   Discharge Medication List as of 06/24/2013 12:47 PM    START taking these medications   Details  predniSONE (DELTASONE) 20 MG tablet Take 1 tablet (20 mg total) by mouth 2 (two) times daily., Starting 06/24/2013, Until Discontinued, Normal        2.  Patient Education/Counseling:  The patient was given appropriate handouts, self care instructions, and instructed in symptomatic relief, including hot saline gargles, throat lozenges, infectious precautions, and need to trade out toothbrush.    3.  Follow up:  The patient was told to follow up here if no better in 3 to 4 days, or sooner if becoming worse in any way, and given some red flag symptoms such as difficulty swallowing or breathing which would prompt immediate return.      Harden Mo, MD 06/24/13 2024

## 2013-06-26 NOTE — ED Notes (Addendum)
Pt. called for her lab results.  Pt. verified x 2 and given results  (HIV non-reactive, throat GC/Chlamydia neg., throat culture: in process). [name] 06/26/2013 Throat culture: No beta hemolytic strep isolated. Pt. notified. 06/28/2013

## 2013-06-27 LAB — CULTURE, GROUP A STREP

## 2013-07-11 ENCOUNTER — Inpatient Hospital Stay (HOSPITAL_COMMUNITY)
Admission: AD | Admit: 2013-07-11 | Discharge: 2013-07-11 | Disposition: A | Discharge status: Home / Self Care | Payer: 59 | Source: Ambulatory Visit | Attending: Obstetrics & Gynecology | Admitting: Obstetrics & Gynecology

## 2013-07-11 ENCOUNTER — Encounter (HOSPITAL_COMMUNITY): Payer: Self-pay | Admitting: *Deleted

## 2013-07-11 DIAGNOSIS — Z202 Contact with and (suspected) exposure to infections with a predominantly sexual mode of transmission: Secondary | ICD-10-CM | POA: Insufficient documentation

## 2013-07-11 DIAGNOSIS — F17200 Nicotine dependence, unspecified, uncomplicated: Secondary | ICD-10-CM | POA: Insufficient documentation

## 2013-07-11 DIAGNOSIS — N898 Other specified noninflammatory disorders of vagina: Secondary | ICD-10-CM | POA: Insufficient documentation

## 2013-07-11 DIAGNOSIS — R109 Unspecified abdominal pain: Secondary | ICD-10-CM | POA: Insufficient documentation

## 2013-07-11 LAB — URINALYSIS, ROUTINE W REFLEX MICROSCOPIC
Bilirubin Urine: NEGATIVE
Glucose, UA: NEGATIVE mg/dL
Hgb urine dipstick: NEGATIVE
KETONES UR: NEGATIVE mg/dL
Leukocytes, UA: NEGATIVE
Nitrite: NEGATIVE
PH: 6 (ref 5.0–8.0)
PROTEIN: NEGATIVE mg/dL
Specific Gravity, Urine: >1.03 — ABNORMAL HIGH (ref 1.005–1.030)
Urobilinogen, UA: 0.2 mg/dL (ref 0.0–1.0)

## 2013-07-11 LAB — WET PREP, GENITAL
Trich, Wet Prep: NONE SEEN
YEAST WET PREP: NONE SEEN

## 2013-07-11 LAB — POCT PREGNANCY, URINE: Preg Test, Ur: NEGATIVE

## 2013-07-11 NOTE — MAU Provider Note (Signed)
History     CSN: [id number]  Arrival date and time: 07/11/13 0945   First Provider Initiated Contact with Patient 07/11/13 1031      No chief complaint on file.  HPI  33 yo G0 who presents with discharge and lower abdominal pain.   - discharge x4 days.  Malodorous. Some tingling but no itching or burning.   No dysuria, hematuria, or other urinary symptoms.  - abdominal pain started after the discharge 5/10. More of  A constant discomfort.  - took two tylenol and didn't help.   Currently sexually active with a single partner x 12 years. But then also has other partners on the side as well which she occasionally uses protection.   Concerned she has an STD. Here for check.   [name] 5/13.    Past Medical History  Diagnosis Date  . Ovarian cyst   . Viral labyrinthitis   . DENTAL PAIN   . IRREGULAR MENSTRUATION   . FEMALE INFERTILITY   . BOILS, RECURRENT   . SEPTATE UTERUS   . Endometriosis   . Breast tenderness in female     Past Surgical History  Procedure Laterality Date  . Ovarian cyst removal  Mar 25 2010  . Biopsy of right breast    . Breast biopsy  1999    right  . Ovarian cyst removal  03/25/2010    right    Family History  Problem Relation Age of Onset  . Hypertension Mother   . Diabetes Mother   . Cancer Father   . Hypertension Father   . Diabetes Brother   . Hypertension Brother     History  Substance Use Topics  . Smoking status: Current Every Day Smoker -- 0.50 packs/day for 10 years    Types: Cigarettes  . Smokeless tobacco: Never Used  . Alcohol Use: Yes     Comment: occas.    Allergies:  Allergies  Allergen Reactions  . Flagyl [Metronidazole] Nausea And Vomiting    Prescriptions prior to admission  Medication Sig Dispense Refill  . ibuprofen (ADVIL,MOTRIN) 800 MG tablet Take 1 tablet (800 mg total) by mouth 3 (three) times daily. Take with food  21 tablet  0    Review of Systems  Constitutional: Negative for fever and chills.   Respiratory: Negative for cough.   Cardiovascular: Negative for chest pain and palpitations.  Gastrointestinal: Positive for abdominal pain (dull). Negative for nausea, vomiting, diarrhea and constipation.  Genitourinary: Negative for dysuria, urgency, frequency and hematuria.  Skin: Negative for rash.  Neurological: Negative for headaches.  Psychiatric/Behavioral: Negative for depression.  - see HPI.  Physical Exam   Blood pressure 118/76, pulse 95, temperature 98.1 F (36.7 C), temperature source Oral, resp. rate 16, height 5\' 2"  (1.575 m), weight 79.198 kg (174 lb 9.6 oz), last menstrual period 06/21/2013, SpO2 100.00%.  Physical Exam  Constitutional: She appears well-developed and well-nourished.  HENT:  Head: Normocephalic and atraumatic.  Eyes: EOM are normal. Pupils are equal, round, and reactive to light.  Neck: Neck supple.  Cardiovascular: Normal rate and regular rhythm.   Respiratory: Effort normal and breath sounds normal.  GI: Soft. Bowel sounds are normal. She exhibits no distension. There is no tenderness. There is no rebound.  Genitourinary: Uterus normal. Uterus is not enlarged and not tender. Cervix exhibits no motion tenderness, no discharge and no friability. Right adnexum displays no mass and no tenderness. Left adnexum displays no mass and no tenderness. No bleeding around the vagina.  Vaginal discharge (white, watery, frothy) found.    MAU Course  Procedures  MDM Wet prep  Gc/chl   Assessment and Plan   33 yo g0 here for STD check.   - wet prep neg - gc/chl pending - discussed need to use protection when having intercourse with multiple partners.  - recommended routine use of cotntraception as well - f/u with PCP for regular care.    Eryca Bolte L Cambren Helm 07/11/2013, 11:07 AM

## 2013-07-11 NOTE — MAU Note (Signed)
Patient request STD screening.

## 2013-07-11 NOTE — MAU Note (Signed)
Vag d/c first noted 4 days ago.  Pain in lower abd started 4 days ago- cramping

## 2013-07-12 LAB — GC/CHLAMYDIA PROBE AMP
CT Probe RNA: NEGATIVE
GC PROBE AMP APTIMA: NEGATIVE

## 2013-07-17 ENCOUNTER — Other Ambulatory Visit: Payer: Self-pay | Admitting: Obstetrics & Gynecology

## 2013-07-17 DIAGNOSIS — N63 Unspecified lump in unspecified breast: Secondary | ICD-10-CM

## 2013-07-26 ENCOUNTER — Other Ambulatory Visit: Payer: Self-pay

## 2013-07-26 ENCOUNTER — Other Ambulatory Visit: Payer: Self-pay | Admitting: Obstetrics & Gynecology

## 2013-07-26 DIAGNOSIS — N63 Unspecified lump in unspecified breast: Secondary | ICD-10-CM

## 2013-08-01 ENCOUNTER — Ambulatory Visit
Admission: RE | Admit: 2013-08-01 | Discharge: 2013-08-01 | Disposition: A | Discharge status: Home / Self Care | Payer: No Typology Code available for payment source | Source: Ambulatory Visit | Attending: Obstetrics & Gynecology | Admitting: Obstetrics & Gynecology

## 2013-08-01 DIAGNOSIS — N63 Unspecified lump in unspecified breast: Secondary | ICD-10-CM

## 2013-08-21 ENCOUNTER — Ambulatory Visit: Payer: No Typology Code available for payment source | Admitting: Obstetrics & Gynecology

## 2013-09-04 ENCOUNTER — Emergency Department (INDEPENDENT_AMBULATORY_CARE_PROVIDER_SITE_OTHER)
Admission: EM | Admit: 2013-09-04 | Discharge: 2013-09-04 | Disposition: A | Discharge status: Home / Self Care | Payer: 59 | Source: Home / Self Care | Attending: Family Medicine | Admitting: Family Medicine

## 2013-09-04 ENCOUNTER — Other Ambulatory Visit (HOSPITAL_COMMUNITY)
Admission: RE | Admit: 2013-09-04 | Discharge: 2013-09-04 | Disposition: A | Discharge status: Home / Self Care | Payer: No Typology Code available for payment source | Source: Ambulatory Visit | Attending: Family Medicine | Admitting: Family Medicine

## 2013-09-04 ENCOUNTER — Encounter (HOSPITAL_COMMUNITY): Payer: Self-pay | Admitting: Emergency Medicine

## 2013-09-04 DIAGNOSIS — N760 Acute vaginitis: Secondary | ICD-10-CM | POA: Insufficient documentation

## 2013-09-04 DIAGNOSIS — Z113 Encounter for screening for infections with a predominantly sexual mode of transmission: Secondary | ICD-10-CM | POA: Diagnosis not present

## 2013-09-04 DIAGNOSIS — R300 Dysuria: Secondary | ICD-10-CM

## 2013-09-04 LAB — POCT URINALYSIS DIP (DEVICE)
Glucose, UA: NEGATIVE mg/dL
Hgb urine dipstick: NEGATIVE
Ketones, ur: 15 mg/dL — AB
LEUKOCYTES UA: NEGATIVE
Nitrite: NEGATIVE
PROTEIN: NEGATIVE mg/dL
Specific Gravity, Urine: >=1.03 (ref 1.005–1.030)
UROBILINOGEN UA: 0.2 mg/dL (ref 0.0–1.0)
pH: 5.5 (ref 5.0–8.0)

## 2013-09-04 LAB — POCT PREGNANCY, URINE: PREG TEST UR: NEGATIVE

## 2013-09-04 MED ORDER — PHENAZOPYRIDINE HCL 100 MG PO TABS: 100.0000 mg | ORAL_TABLET | Freq: Three times a day (TID) | ORAL | Status: DC | PRN

## 2013-09-04 MED ORDER — FLUCONAZOLE 150 MG PO TABS: 150.0000 mg | ORAL_TABLET | Freq: Every day | ORAL | Status: DC

## 2013-09-04 MED ORDER — CLINDAMYCIN HCL 300 MG PO CAPS: 300.0000 mg | ORAL_CAPSULE | Freq: Two times a day (BID) | ORAL | Status: DC

## 2013-09-04 NOTE — Discharge Instructions (Signed)
You likely have BV. Please discuss long term therapy with Dr Roselie Awkward as you tend to get a lot of these infections Please also take the fluconazole for yeast if you do not improve We will call you if there is any other source of infection that needs treatment.  Please start the pyridium for the irritation you are experiencing when you urinate

## 2013-09-04 NOTE — ED Notes (Signed)
C/o vaginal discharge which is thick  OTC meds used as tx

## 2013-09-04 NOTE — ED Provider Notes (Signed)
CSN: [id number]     Arrival date & time 09/04/13  1103 History   First MD Initiated Contact with Patient 09/04/13 1232     Chief Complaint  Patient presents with  . Vaginal Discharge   (Consider location/radiation/quality/duration/timing/severity/associated sxs/prior Treatment) HPI  Vaginal discharge: started 6 days ago. gettign worse. Now w/ odor and irritation. Sexually active w/ condoms but condom broke. Denies fevers, rash, abd pain. Occasional dysuria, and frequency.   Past Medical History  Diagnosis Date  . Ovarian cyst   . Viral labyrinthitis   . DENTAL PAIN   . IRREGULAR MENSTRUATION   . FEMALE INFERTILITY   . BOILS, RECURRENT   . SEPTATE UTERUS   . Endometriosis   . Breast tenderness in female    Past Surgical History  Procedure Laterality Date  . Ovarian cyst removal  Mar 25 2010  . Biopsy of right breast    . Breast biopsy  1999    right  . Ovarian cyst removal  03/25/2010    right   Family History  Problem Relation Age of Onset  . Hypertension Mother   . Diabetes Mother   . Cancer Father   . Hypertension Father   . Diabetes Brother   . Hypertension Brother    History  Substance Use Topics  . Smoking status: Current Every Day Smoker -- 0.50 packs/day for 10 years    Types: Cigarettes  . Smokeless tobacco: Never Used  . Alcohol Use: Yes     Comment: occas.   OB History   Grav Para Term Preterm Abortions TAB SAB Ect Mult Living   0         0     Review of Systems Per HPI with all other pertinent systems negative.   Allergies  Flagyl  Home Medications   Prior to Admission medications   Medication Sig Start Date End Date Taking? Authorizing Provider  clindamycin (CLEOCIN) 300 MG capsule Take 1 capsule (300 mg total) by mouth 2 (two) times daily. 09/04/13   Waldemar Dickens, MD  fluconazole (DIFLUCAN) 150 MG tablet Take 1 tablet (150 mg total) by mouth daily. Repeat dose in 3 days 09/04/13   Waldemar Dickens, MD  ibuprofen (ADVIL,MOTRIN) 800 MG  tablet Take 1 tablet (800 mg total) by mouth 3 (three) times daily. Take with food 06/10/13   Lorrine Kin, PA-C  phenazopyridine (PYRIDIUM) 100 MG tablet Take 1 tablet (100 mg total) by mouth 3 (three) times daily as needed for pain. 09/04/13   Waldemar Dickens, MD   [name] 08/29/2013 Physical Exam  Constitutional: She is oriented to person, place, and time. She appears well-developed and well-nourished. No distress.  HENT:  Head: Normocephalic and atraumatic.  Eyes: EOM are normal. Pupils are equal, round, and reactive to light.  Neck: Normal range of motion.  Pulmonary/Chest: Effort normal. No respiratory distress.  Abdominal: She exhibits no distension. There is no tenderness.  Genitourinary:  Labia nml. Vaginal walls well rugated. No lesions. Copious thick white discharge. No cervical motion tenderness  Musculoskeletal: Normal range of motion. She exhibits no edema and no tenderness.  Neurological: She is alert and oriented to person, place, and time.  Skin: Skin is warm. No rash noted. She is not diaphoretic. No erythema. No pallor.  Psychiatric: She has a normal mood and affect. Her behavior is normal. Judgment and thought content normal.    ED Course  Procedures (including critical care time) Labs Review Labs Reviewed  POCT URINALYSIS DIP (  DEVICE) - Abnormal; Notable for the following:    Bilirubin Urine SMALL (*)    Ketones, ur 15 (*)    All other components within normal limits  POCT PREGNANCY, URINE    Imaging Review No results found.   MDM   1. Vaginitis   2. Dysuria    Likely BV given h/o recurring infections. Metro allergy. Start clinda. F/u PCP or oOBGYN for possible prolonged therapy (+/- intravaginal metro ??? Allergy) vs refills for recurrence.  Dysuria likely urethritis from BV vs other vaginal infection. Pyridium PRN Wet prep sent Precautions given and all questions answered   ,Linna Darner, MD Family Medicine 09/04/2013, 12:53 PM     Waldemar Dickens, MD 09/04/13 [phone]

## 2013-09-06 NOTE — ED Notes (Addendum)
GC/Chlamydia neg., Affirm: Candida and Trich neg., Gardnerella pos. Pt. adequately treated with Cleocin. Pt. called on VM for her results. Will try and contact tomorrow. Roselyn Meier 09/06/2013

## 2013-09-07 ENCOUNTER — Telehealth (HOSPITAL_COMMUNITY): Payer: Self-pay | Admitting: *Deleted

## 2013-09-08 NOTE — ED Notes (Addendum)
GC/Chlamydia neg., Affirm: Candida and Trich neg., Gardnerella pos.  Pt. adequately treated with Cleocin. 7/29 Pt. called on VM for her results.  I called pt.  Pt. verified x 2 and given results.  Pt. told she was adequately treated. Roselyn Meier 09/08/2013

## 2013-09-20 ENCOUNTER — Encounter: Payer: Self-pay | Admitting: Obstetrics and Gynecology

## 2013-09-20 ENCOUNTER — Ambulatory Visit (INDEPENDENT_AMBULATORY_CARE_PROVIDER_SITE_OTHER): Payer: No Typology Code available for payment source | Admitting: Obstetrics and Gynecology

## 2013-09-20 VITALS — BP 110/74 | HR 96 | Ht 62.0 in | Wt 180.4 lb

## 2013-09-20 DIAGNOSIS — Z01419 Encounter for gynecological examination (general) (routine) without abnormal findings: Secondary | ICD-10-CM

## 2013-09-20 DIAGNOSIS — N898 Other specified noninflammatory disorders of vagina: Secondary | ICD-10-CM

## 2013-09-20 DIAGNOSIS — Z0000 Encounter for general adult medical examination without abnormal findings: Secondary | ICD-10-CM

## 2013-09-20 LAB — WET PREP, GENITAL
Trich, Wet Prep: NONE SEEN
Yeast Wet Prep HPF POC: NONE SEEN

## 2013-09-20 NOTE — Progress Notes (Signed)
CC: Gynecologic Exam     HPI [name] is a 33 y.o. G0P0 who presents for GYN exam and Pap. She is concerned that she she has intermittent pelvic cramping pain, known fibroid and primary infertility. She had a mammogram 2 months ago showing small fibroadenoma on right.  [name] 09/11/13. Condoms only for contraception. Denies abnormal bleeding. Last Pap afew years ago. Requests testing for STIs.    Past Medical History  Diagnosis Date  . Ovarian cyst   . Viral labyrinthitis   . DENTAL PAIN   . IRREGULAR MENSTRUATION   . FEMALE INFERTILITY   . BOILS, RECURRENT   . SEPTATE UTERUS   . Endometriosis   . Breast tenderness in female       Past Surgical History  Procedure Laterality Date  . Ovarian cyst removal  Mar 25 2010  . Biopsy of right breast    . Breast biopsy  1999    right  . Ovarian cyst removal  03/25/2010    right    History   Social History  . Marital Status: Single    Spouse Name: N/A    Number of Children: N/A  . Years of Education: N/A   Occupational History  . Not on file.   Social History Main Topics  . Smoking status: Current Every Day Smoker -- 0.50 packs/day for 10 years    Types: Cigarettes  . Smokeless tobacco: Never Used  . Alcohol Use: Yes     Comment: occas.  . Drug Use: No  . Sexual Activity: Yes    Birth Control/ Protection: Condom   Other Topics Concern  . Not on file   Social History Narrative  . No narrative on file    Current Outpatient Prescriptions on File Prior to Visit  Medication Sig Dispense Refill  . clindamycin (CLEOCIN) 300 MG capsule Take 1 capsule (300 mg total) by mouth 2 (two) times daily.  14 capsule  0  . fluconazole (DIFLUCAN) 150 MG tablet Take 1 tablet (150 mg total) by mouth daily. Repeat dose in 3 days  2 tablet  0  . ibuprofen (ADVIL,MOTRIN) 800 MG tablet Take 1 tablet (800 mg total) by mouth 3 (three) times daily. Take with food  21 tablet  0  . phenazopyridine (PYRIDIUM) 100 MG tablet Take 1 tablet (100 mg  total) by mouth 3 (three) times daily as needed for pain.  30 tablet  0  . [DISCONTINUED] Norgestimate-Ethinyl Estradiol Triphasic (TRI-SPRINTEC) 0.18/0.215/0.25 MG-35 MCG tablet Take 1 tablet by mouth daily.  1 Package  11   No current facility-administered medications on file prior to visit.    Allergies  Allergen Reactions  . Flagyl [Metronidazole] Nausea And Vomiting    ROS Pertinent items in HPI  PHYSICAL EXAM Filed Vitals:   09/20/13 0938  BP: 110/74  Pulse: 96   General: Well nourished, well developed female in no acute distress Thyroid: NP Cardiovascular: RRR w/o m Respiratory: CTA bilat Breasts: pendulous; thickening at 11 o'clock in area described for rt. Fibroadenoma, no masses noted Abdomen: Soft, nontender Back: No CVAT Extremities: No edema Neurologic: Alert and oriented Speculum exam: NEFG; vagina with thin white discharge, no blood; cervix clean Bimanual exam: cervix closed, no CMT; uterus retroverted, difficult to size; no adnexal tenderness or masses noted   ASSESSMENT  1. Well woman exam   Hx ovarian cyst and small submucosal fibroid Pelvic pain  PLAN Pap, GC/CT and WP sent Scheduled pelvic US and F/U at [facility] with  Dr. Roselie Awkward per request      [name], CNM 09/20/2013 10:02 AM

## 2013-09-20 NOTE — Progress Notes (Signed)
Pt states that she is here for pap and to get an ultrasound.

## 2013-09-20 NOTE — Patient Instructions (Signed)
Pap Test A Pap test is a procedure done in a clinic office to evaluate cells that are on the surface of the cervix. The cervix is the lower portion of the uterus and upper portion of the vagina. For some women, the cervical region has the potential to form cancer. With consistent evaluations by your caregiver, this type of cancer can be prevented.  If a Pap test is abnormal, it is most often a result of a previous exposure to human papillomavirus (HPV). HPV is a virus that can infect the cells of the cervix and cause dysplasia. Dysplasia is where the cells no longer look normal. If a woman has been diagnosed with high-grade or severe dysplasia, they are at higher risk of developing cervical cancer. People diagnosed with low-grade dysplasia should still be seen by their caregiver because there is a small chance that low-grade dysplasia could develop into cancer.  LET YOUR CAREGIVER KNOW ABOUT:  Recent sexually transmitted infection (STI) you have had.  Any new sex partners you have had.  History of previous abnormal Pap tests results.  History of previous cervical procedures you have had (colposcopy, biopsy, loop electrosurgical excision procedure [LEEP]).  Concerns you have had regarding unusual vaginal discharge.  History of pelvic pain.  Your use of birth control. BEFORE THE PROCEDURE  Ask your caregiver when to schedule your Pap test. It is best not to be on your period if your caregiver uses a wooden spatula to collect cells or applies cells to a glass slide. Newer techniques are not so sensitive to the timing of a menstrual cycle.  Do not douche or have sexual intercourse for 24 hours before the test.   Do not use vaginal creams or tampons for 24 hours before the test.   Empty your bladder just before the test to lessen any discomfort.  PROCEDURE You will lie on an exam table with your feet in stirrups. A warm metal or plastic instrument (speculum) is placed in your vagina. This  instrument allows your caregiver to see the inside of your vagina and look at your cervix. A small, plastic brush or wooden spatula is then used to collect cervical cells. These cells are placed in a lab specimen container. The cells are looked at under a microscope. A specialist will determine if the cells are normal.  AFTER THE PROCEDURE Make sure to get your test results.If your results come back abnormal, you may need further testing.  Document Released: 04/18/2002 Document Revised: 04/20/2011 Document Reviewed: 01/22/2011 ExitCare Patient Information 2015 [employer]. This information is not intended to replace advice given to you by your health care provider. Make sure you discuss any questions you have with your health care provider.  

## 2013-09-25 LAB — CYTOLOGY - PAP

## 2013-09-29 ENCOUNTER — Ambulatory Visit (HOSPITAL_COMMUNITY)
Admission: RE | Admit: 2013-09-29 | Discharge: 2013-09-29 | Disposition: A | Discharge status: Home / Self Care | Payer: No Typology Code available for payment source | Source: Ambulatory Visit | Attending: Obstetrics and Gynecology | Admitting: Obstetrics and Gynecology

## 2013-09-29 ENCOUNTER — Ambulatory Visit: Payer: No Typology Code available for payment source | Admitting: Obstetrics & Gynecology

## 2013-09-29 DIAGNOSIS — N949 Unspecified condition associated with female genital organs and menstrual cycle: Secondary | ICD-10-CM | POA: Diagnosis not present

## 2013-09-29 DIAGNOSIS — D251 Intramural leiomyoma of uterus: Secondary | ICD-10-CM | POA: Insufficient documentation

## 2013-09-29 DIAGNOSIS — Z01419 Encounter for gynecological examination (general) (routine) without abnormal findings: Secondary | ICD-10-CM

## 2013-10-06 ENCOUNTER — Ambulatory Visit: Payer: No Typology Code available for payment source | Admitting: Obstetrics and Gynecology

## 2013-10-11 ENCOUNTER — Telehealth: Payer: Self-pay | Admitting: *Deleted

## 2013-10-11 NOTE — Telephone Encounter (Addendum)
Pt left message requesting results of Pap and [place]. Please call back. I returned pt's call and informed her of Pap results and [place] results.  Pt voiced understanding and had no additional questions.

## 2013-10-21 ENCOUNTER — Encounter (HOSPITAL_COMMUNITY): Payer: Self-pay | Admitting: Emergency Medicine

## 2013-10-21 ENCOUNTER — Emergency Department (INDEPENDENT_AMBULATORY_CARE_PROVIDER_SITE_OTHER)
Admission: EM | Admit: 2013-10-21 | Discharge: 2013-10-21 | Disposition: A | Discharge status: Home / Self Care | Payer: No Typology Code available for payment source | Source: Home / Self Care | Attending: Emergency Medicine | Admitting: Emergency Medicine

## 2013-10-21 ENCOUNTER — Other Ambulatory Visit (HOSPITAL_COMMUNITY)
Admission: RE | Admit: 2013-10-21 | Discharge: 2013-10-21 | Disposition: A | Discharge status: Home / Self Care | Payer: No Typology Code available for payment source | Source: Ambulatory Visit | Attending: Emergency Medicine | Admitting: Emergency Medicine

## 2013-10-21 DIAGNOSIS — N760 Acute vaginitis: Secondary | ICD-10-CM | POA: Insufficient documentation

## 2013-10-21 DIAGNOSIS — Z113 Encounter for screening for infections with a predominantly sexual mode of transmission: Secondary | ICD-10-CM | POA: Diagnosis not present

## 2013-10-21 DIAGNOSIS — B9689 Other specified bacterial agents as the cause of diseases classified elsewhere: Secondary | ICD-10-CM

## 2013-10-21 DIAGNOSIS — A499 Bacterial infection, unspecified: Secondary | ICD-10-CM

## 2013-10-21 LAB — POCT URINALYSIS DIP (DEVICE)
Glucose, UA: NEGATIVE mg/dL
HGB URINE DIPSTICK: NEGATIVE
KETONES UR: 15 mg/dL — AB
Leukocytes, UA: NEGATIVE
Nitrite: NEGATIVE
PH: 6 (ref 5.0–8.0)
Protein, ur: NEGATIVE mg/dL
Specific Gravity, Urine: 1.025 (ref 1.005–1.030)
Urobilinogen, UA: 1 mg/dL (ref 0.0–1.0)

## 2013-10-21 LAB — POCT PREGNANCY, URINE: Preg Test, Ur: NEGATIVE

## 2013-10-21 MED ORDER — CLINDAMYCIN HCL 300 MG PO CAPS: ORAL_CAPSULE | ORAL | Status: DC

## 2013-10-21 NOTE — Discharge Instructions (Signed)
Bacterial Vaginosis Bacterial vaginosis is a vaginal infection that occurs when the normal balance of bacteria in the vagina is disrupted. It results from an overgrowth of certain bacteria. This is the most common vaginal infection in women of childbearing age. Treatment is important to prevent complications, especially in pregnant women, as it can cause a premature delivery. CAUSES  Bacterial vaginosis is caused by an increase in harmful bacteria that are normally present in smaller amounts in the vagina. Several different kinds of bacteria can cause bacterial vaginosis. However, the reason that the condition develops is not fully understood. RISK FACTORS Certain activities or behaviors can put you at an increased risk of developing bacterial vaginosis, including:  Having a new sex partner or multiple sex partners.  Douching.  Using an intrauterine device (IUD) for contraception. Women do not get bacterial vaginosis from toilet seats, bedding, swimming pools, or contact with objects around them. SIGNS AND SYMPTOMS  Some women with bacterial vaginosis have no signs or symptoms. Common symptoms include:  Grey vaginal discharge.  A fishlike odor with discharge, especially after sexual intercourse.  Itching or burning of the vagina and vulva.  Burning or pain with urination. DIAGNOSIS  Your health care provider will take a medical history and examine the vagina for signs of bacterial vaginosis. A sample of vaginal fluid may be taken. Your health care provider will look at this sample under a microscope to check for bacteria and abnormal cells. A vaginal pH test may also be done.  TREATMENT  Bacterial vaginosis may be treated with antibiotic medicines. These may be given in the form of a pill or a vaginal cream. A second round of antibiotics may be prescribed if the condition comes back after treatment.  HOME CARE INSTRUCTIONS   Only take over-the-counter or prescription medicines as  directed by your health care provider.  If antibiotic medicine was prescribed, take it as directed. Make sure you finish it even if you start to feel better.  Do not have sex until treatment is completed.  Tell all sexual partners that you have a vaginal infection. They should see their health care provider and be treated if they have problems, such as a mild rash or itching.  Practice safe sex by using condoms and only having one sex partner. SEEK MEDICAL CARE IF:   Your symptoms are not improving after 3 days of treatment.  You have increased discharge or pain.  You have a fever. MAKE SURE YOU:   Understand these instructions.  Will watch your condition.  Will get help right away if you are not doing well or get worse. FOR MORE INFORMATION  Centers for Disease Control and Prevention, Division of STD Prevention: www.cdc.gov/std American Sexual Health Association (ASHA): www.ashastd.org  Document Released: 01/26/2005 Document Revised: 11/16/2012 Document Reviewed: 09/07/2012 ExitCare Patient Information 2015 [employer]. This information is not intended to replace advice given to you by your health care provider. Make sure you discuss any questions you have with your health care provider.  

## 2013-10-21 NOTE — ED Notes (Signed)
Patient reports vaginal discharge , odor, and low abdominal pain.  Patient thinks this is bv.  Reports she gets bv frequently.  Denies urinary symptoms.

## 2013-10-21 NOTE — ED Notes (Signed)
Pelvic equipment at bedside, patient instructed to put on gown

## 2013-10-21 NOTE — ED Provider Notes (Signed)
  Chief Complaint   Vaginal Discharge   History of Present Illness   [name] is a 33 year old female who has had a four-day history of vaginal discharge and odor. She denies any itching. She has had some crampy lower abdominal pain and lower back pain. She denies any vaginal or vulvar pain or visible lesions. She's had no fever, chills, nausea, vomiting, or urinary symptoms. She has had bacterial vaginosis multiple times in the past. She is allergic to Flagyl, so her gynecologist usually gives her clindamycin which has helped, but this continues to recur.  Review of Systems   Other than as noted above, the patient denies any of the following symptoms: Systemic:  No fever or chills GI:  No abdominal pain, nausea, vomiting, diarrhea, constipation, melena or hematochezia. GU:  No dysuria, frequency, urgency, hematuria, vaginal discharge, itching, or abnormal vaginal bleeding.  [place]   Past medical history, family history, social history, meds, and allergies were reviewed.  She is allergic to metronidazole.  Physical Examination    Vital signs:  BP 109/80  Pulse 77  Temp(Src) 98.1 F (36.7 C) (Oral)  Resp 12  SpO2 99% General:  Alert, oriented and in no distress. Lungs:  Breath sounds clear and equal bilaterally.  No wheezes, rales or rhonchi. Heart:  Regular rhythm.  No gallops or murmers. Abdomen:  Soft, flat and non-distended.  No organomegaly or mass.  No tenderness, guarding or rebound.  Bowel sounds normally active. Pelvic exam:  Normal external genitalia, there is a thin, milky, vaginal discharge which is slightly malodorous. Cervix appears normal. No pain on cervical motion. Uterus is normal in size and shape and nontender. No adnexal masses or tenderness.  DNA probes for gonorrhea, Chlamydia, Trichomonas, Gardnerella, Candida were obtained. Skin:  Clear, warm and dry.  Chaperoned by Corky Sing, RT who was present throughout the pelvic exam.   Labs   Results for  orders placed during the hospital encounter of 10/21/13  POCT URINALYSIS DIP (DEVICE)      Result Value Ref Range   Glucose, UA NEGATIVE  NEGATIVE mg/dL   Bilirubin Urine SMALL (*) NEGATIVE   Ketones, ur 15 (*) NEGATIVE mg/dL   Specific Gravity, Urine 1.025  1.005 - 1.030   Hgb urine dipstick NEGATIVE  NEGATIVE   pH 6.0  5.0 - 8.0   Protein, ur NEGATIVE  NEGATIVE mg/dL   Urobilinogen, UA 1.0  0.0 - 1.0 mg/dL   Nitrite NEGATIVE  NEGATIVE   Leukocytes, UA NEGATIVE  NEGATIVE  POCT PREGNANCY, URINE      Result Value Ref Range   Preg Test, Ur NEGATIVE  NEGATIVE    Assessment   The encounter diagnosis was BV (bacterial vaginosis).       Plan    1.  Meds:  The following meds were prescribed:   New Prescriptions   CLINDAMYCIN (CLEOCIN) 300 MG CAPSULE    1 BID    2.  Patient Education/Counseling:  The patient was given appropriate handouts, self care instructions, and instructed in symptomatic relief.  Suggested use of probiotics.  3.  Follow up:  The patient was told to follow up here if no better in 3 to 4 days, or sooner if becoming worse in any way, and given some red flag symptoms such as worsening pain, fever, persistent vomiting, or heavy vaginal bleeding which would prompt immediate return.      Harden Mo, MD 10/21/13 1600

## 2013-10-24 NOTE — ED Notes (Signed)
GC/Chlamydia neg., Affirm: Candida and Trich neg., Gardnerella pos. Pt. adequately treated with Cleocin. Roselyn Meier 10/24/2013

## 2013-10-24 NOTE — Progress Notes (Signed)
Quick Note:  Results are abnormal as noted, but have been adequately treated. No further action necessary. ______ 

## 2013-11-02 ENCOUNTER — Ambulatory Visit: Payer: No Typology Code available for payment source | Admitting: Obstetrics & Gynecology

## 2013-11-20 ENCOUNTER — Encounter (HOSPITAL_COMMUNITY): Payer: Self-pay | Admitting: Emergency Medicine

## 2013-11-20 ENCOUNTER — Emergency Department (INDEPENDENT_AMBULATORY_CARE_PROVIDER_SITE_OTHER)
Admission: EM | Admit: 2013-11-20 | Discharge: 2013-11-20 | Disposition: A | Discharge status: Home / Self Care | Payer: No Typology Code available for payment source | Source: Home / Self Care | Attending: Emergency Medicine | Admitting: Emergency Medicine

## 2013-11-20 ENCOUNTER — Other Ambulatory Visit (HOSPITAL_COMMUNITY)
Admission: RE | Admit: 2013-11-20 | Discharge: 2013-11-20 | Disposition: A | Discharge status: Home / Self Care | Payer: 59 | Source: Ambulatory Visit | Attending: Emergency Medicine | Admitting: Emergency Medicine

## 2013-11-20 DIAGNOSIS — Z113 Encounter for screening for infections with a predominantly sexual mode of transmission: Secondary | ICD-10-CM | POA: Insufficient documentation

## 2013-11-20 DIAGNOSIS — N760 Acute vaginitis: Secondary | ICD-10-CM | POA: Diagnosis present

## 2013-11-20 DIAGNOSIS — Z23 Encounter for immunization: Secondary | ICD-10-CM

## 2013-11-20 DIAGNOSIS — Z202 Contact with and (suspected) exposure to infections with a predominantly sexual mode of transmission: Secondary | ICD-10-CM

## 2013-11-20 DIAGNOSIS — H15101 Unspecified episcleritis, right eye: Secondary | ICD-10-CM

## 2013-11-20 LAB — RPR

## 2013-11-20 LAB — POCT URINALYSIS DIP (DEVICE)
BILIRUBIN URINE: NEGATIVE
GLUCOSE, UA: NEGATIVE mg/dL
Hgb urine dipstick: NEGATIVE
KETONES UR: NEGATIVE mg/dL
LEUKOCYTES UA: NEGATIVE
Nitrite: NEGATIVE
PROTEIN: NEGATIVE mg/dL
SPECIFIC GRAVITY, URINE: 1.015 (ref 1.005–1.030)
Urobilinogen, UA: 0.2 mg/dL (ref 0.0–1.0)
pH: 8.5 — ABNORMAL HIGH (ref 5.0–8.0)

## 2013-11-20 LAB — POCT PREGNANCY, URINE: PREG TEST UR: NEGATIVE

## 2013-11-20 LAB — HIV ANTIBODY (ROUTINE TESTING W REFLEX): HIV: NONREACTIVE

## 2013-11-20 MED ORDER — KETOROLAC TROMETHAMINE 0.5 % OP SOLN: 1.0000 [drp] | Freq: Four times a day (QID) | OPHTHALMIC | Status: DC

## 2013-11-20 MED ORDER — INFLUENZA VIRUS VACC SPLIT PF IM SUSP
0.5000 mL | Freq: Once | INTRAMUSCULAR | Status: AC
  Administered 2013-11-20: 0.5 mL via INTRAMUSCULAR

## 2013-11-20 MED ORDER — INFLUENZA VAC SPLIT QUAD 0.5 ML IM SUSY
PREFILLED_SYRINGE | INTRAMUSCULAR | Status: AC
  Filled 2013-11-20: qty 0.5

## 2013-11-20 NOTE — Discharge Instructions (Signed)
Scleritis and Episcleritis The outer part of the eyeball is covered with a tough fibrous covering called the sclera. It is the white part of the eye. This tough covering also has a thin membrane lying on top of it called the episclera.   When the sclera becomes red and sore (inflamed), it is called scleritis.  When the episclera becomes inflamed, it is called episcleritis. CAUSES   Scleritis is usually more severe and is associated with autoimmune diseases such as:  Rheumatoid arthritis.  Inflammations of the bowel such as Crohn's Disease (regional enteritis).  Ulcerative colitis.  Episcleritis usually has no known cause. SYMPTOMS  Both scleritis and episcleritis cause red patches or a nodule on the eye. DIAGNOSIS  This condition should be examined by an ophthalmologist. This is because very strong medications that have side effects to the body and eye may be required to treat severe attacks. Further investigations into the patient's general health may be necessary. TREATMENT   Episcleritis tends to get better without treatment within a week or two.  Scleritis is more severe. Often, your caregiver will prescribe steroids by mouth (orally) or as drops in the eye. This treatment helps lessen the redness and soreness (inflammation). HOME CARE INSTRUCTIONS   Take all medications as directed.  Keep your follow-up appointments as directed.  Avoid irritation of the involved eye(s).  Stop using hard or soft contact lenses until your caregiver tells you that it is safe to use them. SEEK MEDICAL CARE IF:   Redness or irritation gets worse.  You develop pain or sensitivity to light.  You develop any change in vision in the involved eye(s). Document Released: 01/20/2001 Document Revised: 04/20/2011 Document Reviewed: 05/24/2008 ExitCare Patient Information 2015 [employer]. This information is not intended to replace advice given to you by your health care provider. Make sure you  discuss any questions you have with your health care provider.  

## 2013-11-20 NOTE — ED Notes (Signed)
Right eye red for 2 weeks, itching and painful. Std screening: vaginal discharge, condom broke.

## 2013-11-20 NOTE — ED Notes (Signed)
Patient moved from room 6 for a room with a pelvic table

## 2013-11-20 NOTE — ED Provider Notes (Signed)
Chief Complaint   Conjunctivitis and SEXUALLY TRANSMITTED DISEASE   History of Present Illness   [name] is a 33 year old female who comes in today because of a red right eye, to be checked for STDs, and for flu shot.  There is localized redness and itching just lateral to the cornea of the right eye for the past week. There's been no drainage. She denies any injury to the eye, or foreign body. Her vision has been normal. She's had no photophobia. She had the same thing about 2 years ago in the left eye. This was diagnosed as being due to episcleritis, and improved with eyedrops.  She also would like to be checked for STDs. She has a slight discharge and she is taking clindamycin for her. She's had recurrent episodes of bacterial vaginosis. The patient states the condom broke about a week ago. She denies any pelvic pain.  She would also like to get a flu vaccine today.  Review of Systems   Other than as noted above, the patient denies any of the following symptoms: Systemic:  No fever or chills GI:  No abdominal pain, nausea, vomiting, diarrhea, constipation, melena or hematochezia. GU:  No dysuria, frequency, urgency, hematuria, vaginal discharge, itching, or abnormal vaginal bleeding.  [place]   Past medical history, family history, social history, meds, and allergies were reviewed.    Physical Examination    Vital signs:  BP 119/82  Pulse 69  Temp(Src) 98.2 F (36.8 C) (Oral)  Resp 16  SpO2 100%  [name] 11/07/2013 General:  Alert, oriented and in no distress. Eyes: There was a localized area of erythema just lateral to the cornea of the right eye. There was no inflammation the remainder of the conjunctival sac. No discharge. Cornea was intact to fluorescein staining. Anterior chamber was normal. PERRLA, full EOMs, fundi benign. Lungs:  Breath sounds clear and equal bilaterally.  No wheezes, rales or rhonchi. Heart:  Regular rhythm.  No gallops or murmers. Abdomen:  Soft,  flat and non-distended.  No organomegaly or mass.  No tenderness, guarding or rebound.  Bowel sounds normally active. Pelvic exam:  Normal external genitalia. Vaginal and cervical mucosa were normal. There was no bleeding or discharge. No pain on cervical motion. Uterus was midposition, normal in size and shape, no adnexal masses or tenderness.  DNA probes for gonorrhea, Chlamydia, Trichomonas, Gardnerella, Candida were obtained. Skin:  Clear, warm and dry.  Labs   Results for orders placed during the hospital encounter of 11/20/13  HIV ANTIBODY (ROUTINE TESTING)      Result Value Ref Range   HIV 1&2 Ab, 4th Generation NONREACTIVE  NONREACTIVE  RPR      Result Value Ref Range   RPR NON REAC  NON REAC  POCT URINALYSIS DIP (DEVICE)      Result Value Ref Range   Glucose, UA NEGATIVE  NEGATIVE mg/dL   Bilirubin Urine NEGATIVE  NEGATIVE   Ketones, ur NEGATIVE  NEGATIVE mg/dL   Specific Gravity, Urine 1.015  1.005 - 1.030   Hgb urine dipstick NEGATIVE  NEGATIVE   pH 8.5 (*) 5.0 - 8.0   Protein, ur NEGATIVE  NEGATIVE mg/dL   Urobilinogen, UA 0.2  0.0 - 1.0 mg/dL   Nitrite NEGATIVE  NEGATIVE   Leukocytes, UA NEGATIVE  NEGATIVE  POCT PREGNANCY, URINE      Result Value Ref Range   Preg Test, Ur NEGATIVE  NEGATIVE     Course in Urgent [place]   She was  given a flu vaccine.  Assessment   The primary encounter diagnosis was Episcleritis of right eye. A diagnosis of Potential exposure to STD was also pertinent to this visit.  She appears to have a recurrence of epi scleritis. This time the right eye. Last time was in the left eye. She saw an ophthalmologist and she was referred to return to him if this persists.       Plan    1.  Meds:  The following meds were prescribed:   Discharge Medication List as of 11/20/2013  1:14 PM    START taking these medications   Details  ketorolac (ACULAR) 0.5 % ophthalmic solution Place 1 drop into both eyes every 6 (six) hours., Starting  11/20/2013, Until Discontinued, Normal        2.  Patient Education/Counseling:  The patient was given appropriate handouts, self care instructions, and instructed in symptomatic relief.    3.  Follow up:  The patient was told to follow up here if no better in 3 to 4 days, or sooner if becoming worse in any way, and given some red flag symptoms such as worsening pain, fever, persistent vomiting, or heavy vaginal bleeding which would prompt immediate return.  Will call her about any positive results on STD testing.     Harden Mo, MD 11/20/13 2150

## 2013-11-21 LAB — CERVICOVAGINAL ANCILLARY ONLY
CHLAMYDIA, DNA PROBE: NEGATIVE
Neisseria Gonorrhea: NEGATIVE
Wet Prep (BD Affirm): NEGATIVE
Wet Prep (BD Affirm): NEGATIVE
Wet Prep (BD Affirm): POSITIVE — AB

## 2013-11-22 ENCOUNTER — Telehealth (HOSPITAL_COMMUNITY): Payer: Self-pay | Admitting: Emergency Medicine

## 2013-11-22 MED ORDER — CLINDAMYCIN HCL 300 MG PO CAPS: 300.0000 mg | ORAL_CAPSULE | Freq: Two times a day (BID) | ORAL | Status: DC

## 2013-11-22 NOTE — ED Notes (Signed)
Patient called to discuss lab reports . After verifying ID , discussed all reports. Patient unable to tollerate Flagyl due to nausea. Dr Jake Michaelis advised, and will send in clindamycin to [facility] at patient request

## 2013-11-22 NOTE — ED Notes (Signed)
The patient's DNA probe came back positive for Gardnerella. She states she is allergic to metronidazole. Will treat with clindamycin 300 mg, #14, 1 twice a day for one week. Prescription to be sent to the [facility] on [address].  Harden Mo, MD 11/22/13 [phone]

## 2013-12-24 ENCOUNTER — Encounter (HOSPITAL_COMMUNITY): Payer: Self-pay

## 2013-12-24 ENCOUNTER — Emergency Department (HOSPITAL_COMMUNITY)
Admission: EM | Admit: 2013-12-24 | Discharge: 2013-12-24 | Disposition: A | Discharge status: Home / Self Care | Payer: No Typology Code available for payment source | Attending: Emergency Medicine | Admitting: Emergency Medicine

## 2013-12-24 ENCOUNTER — Emergency Department (HOSPITAL_COMMUNITY): Payer: No Typology Code available for payment source

## 2013-12-24 DIAGNOSIS — N898 Other specified noninflammatory disorders of vagina: Secondary | ICD-10-CM

## 2013-12-24 DIAGNOSIS — Q512 Other doubling of uterus: Secondary | ICD-10-CM | POA: Insufficient documentation

## 2013-12-24 DIAGNOSIS — Z792 Long term (current) use of antibiotics: Secondary | ICD-10-CM | POA: Insufficient documentation

## 2013-12-24 DIAGNOSIS — Z791 Long term (current) use of non-steroidal anti-inflammatories (NSAID): Secondary | ICD-10-CM | POA: Diagnosis not present

## 2013-12-24 DIAGNOSIS — J069 Acute upper respiratory infection, unspecified: Secondary | ICD-10-CM | POA: Insufficient documentation

## 2013-12-24 DIAGNOSIS — Z720 Tobacco use: Secondary | ICD-10-CM | POA: Insufficient documentation

## 2013-12-24 DIAGNOSIS — Z79899 Other long term (current) drug therapy: Secondary | ICD-10-CM | POA: Diagnosis not present

## 2013-12-24 DIAGNOSIS — Z872 Personal history of diseases of the skin and subcutaneous tissue: Secondary | ICD-10-CM | POA: Diagnosis not present

## 2013-12-24 DIAGNOSIS — Z8719 Personal history of other diseases of the digestive system: Secondary | ICD-10-CM | POA: Diagnosis not present

## 2013-12-24 DIAGNOSIS — Z3202 Encounter for pregnancy test, result negative: Secondary | ICD-10-CM | POA: Diagnosis not present

## 2013-12-24 DIAGNOSIS — B9789 Other viral agents as the cause of diseases classified elsewhere: Secondary | ICD-10-CM

## 2013-12-24 LAB — URINALYSIS, ROUTINE W REFLEX MICROSCOPIC
Bilirubin Urine: NEGATIVE
Glucose, UA: NEGATIVE mg/dL
Hgb urine dipstick: NEGATIVE
Ketones, ur: 15 mg/dL — AB
Leukocytes, UA: NEGATIVE
Nitrite: NEGATIVE
Protein, ur: NEGATIVE mg/dL
Specific Gravity, Urine: 1.019 (ref 1.005–1.030)
Urobilinogen, UA: 0.2 mg/dL (ref 0.0–1.0)
pH: 6 (ref 5.0–8.0)

## 2013-12-24 LAB — WET PREP, GENITAL
Trich, Wet Prep: NONE SEEN
Yeast Wet Prep HPF POC: NONE SEEN

## 2013-12-24 LAB — POC URINE PREG, ED: Preg Test, Ur: NEGATIVE

## 2013-12-24 MED ORDER — HYDROMORPHONE HCL 1 MG/ML IJ SOLN: 1.0000 mg | Freq: Once | INTRAMUSCULAR | Status: DC

## 2013-12-24 MED ORDER — PREDNISONE 50 MG PO TABS: 50.0000 mg | ORAL_TABLET | Freq: Every day | ORAL | Status: DC

## 2013-12-24 MED ORDER — GUAIFENESIN ER 1200 MG PO TB12: 1.0000 | ORAL_TABLET | Freq: Two times a day (BID) | ORAL | Status: DC

## 2013-12-24 MED ORDER — CEFIXIME 400 MG PO TABS
400.0000 mg | ORAL_TABLET | Freq: Once | ORAL | Status: AC
  Administered 2013-12-24: 400 mg via ORAL
  Filled 2013-12-24 (×2): qty 1

## 2013-12-24 MED ORDER — SODIUM CHLORIDE 0.9 % IV BOLUS (SEPSIS)
1000.0000 mL | Freq: Once | INTRAVENOUS | Status: AC
  Administered 2013-12-24: 1000 mL via INTRAVENOUS

## 2013-12-24 MED ORDER — AZITHROMYCIN 250 MG PO TABS
1000.0000 mg | ORAL_TABLET | Freq: Once | ORAL | Status: AC
  Administered 2013-12-24: 1000 mg via ORAL
  Filled 2013-12-24: qty 4

## 2013-12-24 MED ORDER — ACETAMINOPHEN-CODEINE 120-12 MG/5ML PO SOLN: 10.0000 mL | ORAL | Status: DC | PRN

## 2013-12-24 NOTE — ED Provider Notes (Signed)
CSN: [id number]     Arrival date & time 12/24/13  8469 History   First MD Initiated Contact with Patient 12/24/13 [phone]     Chief Complaint  Patient presents with  . Generalized Body Aches  . Vaginal Discharge     (Consider location/radiation/quality/duration/timing/severity/associated sxs/prior Treatment) HPI Patient presents to the emergency department with cough, runny nose, nasal congestion and vaginal discharge.  Patient states the vaginal discharges been present for one week.  The cough, runny nose and sore throat has been present over the last 3 days.  Patient states she did not take any medications prior to arrival.  Patient denies nausea, vomiting, diarrhea, abdominal pain, weakness, dizziness, headache, blurred vision, rash, earache, fever, hematuria, hematemesis, hemoptysis or syncope the patient states nothing seems to make her condition, better or worse Past Medical History  Diagnosis Date  . Ovarian cyst   . Viral labyrinthitis   . DENTAL PAIN   . IRREGULAR MENSTRUATION   . FEMALE INFERTILITY   . BOILS, RECURRENT   . SEPTATE UTERUS   . Endometriosis   . Breast tenderness in female    Past Surgical History  Procedure Laterality Date  . Ovarian cyst removal  Mar 25 2010  . Biopsy of right breast    . Breast biopsy  1999    right  . Ovarian cyst removal  03/25/2010    right   Family History  Problem Relation Age of Onset  . Hypertension Mother   . Diabetes Mother   . Cancer Father   . Hypertension Father   . Diabetes Brother   . Hypertension Brother    History  Substance Use Topics  . Smoking status: Current Every Day Smoker -- 0.50 packs/day for 10 years    Types: Cigarettes  . Smokeless tobacco: Never Used  . Alcohol Use: Yes     Comment: occas.   OB History    Gravida Para Term Preterm AB TAB SAB Ectopic Multiple Living   0         0     Review of Systems   All other systems negative except as documented in the HPI. All pertinent positives and  negatives as reviewed in the HPI.  Allergies  Flagyl  Home Medications   Prior to Admission medications   Medication Sig Start Date End Date Taking? Authorizing Provider  clindamycin (CLEOCIN) 300 MG capsule Take 1 capsule (300 mg total) by mouth 2 (two) times daily. Patient not taking: Reported on 12/24/2013 09/04/13   Waldemar Dickens, MD  clindamycin (CLEOCIN) 300 MG capsule 1 BID Patient not taking: Reported on 12/24/2013 10/21/13   Harden Mo, MD  clindamycin (CLEOCIN) 300 MG capsule Take 1 capsule (300 mg total) by mouth 2 (two) times daily. Patient not taking: Reported on 12/24/2013 11/22/13   Harden Mo, MD  fluconazole (DIFLUCAN) 150 MG tablet Take 1 tablet (150 mg total) by mouth daily. Repeat dose in 3 days Patient not taking: Reported on 12/24/2013 09/04/13   Waldemar Dickens, MD  ibuprofen (ADVIL,MOTRIN) 800 MG tablet Take 1 tablet (800 mg total) by mouth 3 (three) times daily. Take with food Patient not taking: Reported on 12/24/2013 06/10/13   Harvie Heck, PA-C  ketorolac (ACULAR) 0.5 % ophthalmic solution Place 1 drop into both eyes every 6 (six) hours. Patient not taking: Reported on 12/24/2013 11/20/13   Harden Mo, MD  phenazopyridine (PYRIDIUM) 100 MG tablet Take 1 tablet (100 mg total) by mouth 3 (three) times  daily as needed for pain. Patient not taking: Reported on 12/24/2013 09/04/13   Waldemar Dickens, MD   BP 108/71 mmHg  Pulse 67  Temp(Src) 99 F (37.2 C) (Oral)  Resp 15  SpO2 100%  [name] 12/01/2013 Physical Exam  Constitutional: She is oriented to person, place, and time. She appears well-developed and well-nourished. No distress.  HENT:  Head: Normocephalic and atraumatic.  Mouth/Throat: Oropharynx is clear and moist.  Eyes: Pupils are equal, round, and reactive to light.  Cardiovascular: Normal rate, regular rhythm and normal heart sounds.  Exam reveals no gallop and no friction rub.   No murmur heard. Pulmonary/Chest: Effort normal and breath  sounds normal. No respiratory distress. She has no wheezes.  Genitourinary: Uterus normal. Cervix exhibits discharge. Cervix exhibits no motion tenderness and no friability. Right adnexum displays tenderness. Left adnexum displays tenderness. No erythema, tenderness or bleeding in the vagina. No foreign body around the vagina. No signs of injury around the vagina. Vaginal discharge found.  Musculoskeletal: She exhibits no edema.  Neurological: She is alert and oriented to person, place, and time.  Skin: Skin is warm and dry. No erythema.  Nursing note and vitals reviewed.   ED Course  Procedures (including critical care time) Labs Review Labs Reviewed  WET PREP, GENITAL - Abnormal; Notable for the following:    Clue Cells Wet Prep HPF POC FEW (*)    WBC, Wet Prep HPF POC FEW (*)    All other components within normal limits  URINALYSIS, ROUTINE W REFLEX MICROSCOPIC - Abnormal; Notable for the following:    Ketones, ur 15 (*)    All other components within normal limits  GC/CHLAMYDIA PROBE AMP  POC URINE PREG, ED    Imaging Review Dg Chest 2 View  12/24/2013   CLINICAL DATA:  Generalized body aches and vaginal discharge.  EXAM: CHEST  2 VIEW  COMPARISON:  02/06/2013  FINDINGS: The heart size and mediastinal contours are within normal limits. Both lungs are clear. The visualized skeletal structures are unremarkable.  IMPRESSION: No active cardiopulmonary disease.   Electronically Signed   By: Curlene Dolphin M.D.   On: 12/24/2013 12:06   Patient will be treated preemptively for this vaginal discharge.  The patient is advised to return here as needed.  I also told her [facility].the patient will also be treated for upper respiratory tract infection  MDM   Final diagnoses:  None        Brent General, PA-C 12/24/13 1357  Quintella Reichert, MD 12/24/13 1705

## 2013-12-24 NOTE — ED Notes (Signed)
Pt states she has been having generalized body aches for the past 11 days. No N/V/fever, or chills. Did state she got the flu shot last month.

## 2013-12-24 NOTE — ED Notes (Signed)
Brought pt to room; pt getting undressed and into a gown at this time; Opal Sidles, EMT aware of pt

## 2013-12-24 NOTE — ED Notes (Signed)
Patient transported to X-ray 

## 2013-12-25 LAB — GC/CHLAMYDIA PROBE AMP
CT Probe RNA: NEGATIVE
GC Probe RNA: NEGATIVE

## 2013-12-26 ENCOUNTER — Telehealth (HOSPITAL_BASED_OUTPATIENT_CLINIC_OR_DEPARTMENT_OTHER): Payer: Self-pay | Admitting: Emergency Medicine

## 2014-01-01 ENCOUNTER — Emergency Department (HOSPITAL_COMMUNITY)
Admission: EM | Admit: 2014-01-01 | Discharge: 2014-01-01 | Disposition: A | Discharge status: Home / Self Care | Payer: No Typology Code available for payment source | Attending: Emergency Medicine | Admitting: Emergency Medicine

## 2014-01-01 ENCOUNTER — Encounter (HOSPITAL_COMMUNITY): Payer: Self-pay | Admitting: Emergency Medicine

## 2014-01-01 ENCOUNTER — Emergency Department (HOSPITAL_COMMUNITY): Payer: No Typology Code available for payment source

## 2014-01-01 DIAGNOSIS — Z791 Long term (current) use of non-steroidal anti-inflammatories (NSAID): Secondary | ICD-10-CM | POA: Diagnosis not present

## 2014-01-01 DIAGNOSIS — Z872 Personal history of diseases of the skin and subcutaneous tissue: Secondary | ICD-10-CM | POA: Insufficient documentation

## 2014-01-01 DIAGNOSIS — R0789 Other chest pain: Secondary | ICD-10-CM | POA: Insufficient documentation

## 2014-01-01 DIAGNOSIS — Z792 Long term (current) use of antibiotics: Secondary | ICD-10-CM | POA: Diagnosis not present

## 2014-01-01 DIAGNOSIS — Z7952 Long term (current) use of systemic steroids: Secondary | ICD-10-CM | POA: Insufficient documentation

## 2014-01-01 DIAGNOSIS — Z79899 Other long term (current) drug therapy: Secondary | ICD-10-CM | POA: Diagnosis not present

## 2014-01-01 DIAGNOSIS — Q512 Other doubling of uterus: Secondary | ICD-10-CM | POA: Diagnosis not present

## 2014-01-01 DIAGNOSIS — R0781 Pleurodynia: Secondary | ICD-10-CM | POA: Diagnosis present

## 2014-01-01 DIAGNOSIS — Z8709 Personal history of other diseases of the respiratory system: Secondary | ICD-10-CM | POA: Insufficient documentation

## 2014-01-01 DIAGNOSIS — Z720 Tobacco use: Secondary | ICD-10-CM | POA: Insufficient documentation

## 2014-01-01 DIAGNOSIS — Z8742 Personal history of other diseases of the female genital tract: Secondary | ICD-10-CM | POA: Diagnosis not present

## 2014-01-01 DIAGNOSIS — R079 Chest pain, unspecified: Secondary | ICD-10-CM

## 2014-01-01 LAB — CBC WITH DIFFERENTIAL/PLATELET
Basophils Absolute: 0 10*3/uL (ref 0.0–0.1)
Basophils Relative: 0 % (ref 0–1)
Eosinophils Absolute: 0.1 10*3/uL (ref 0.0–0.7)
Eosinophils Relative: 1 % (ref 0–5)
HCT: 39.8 % (ref 36.0–46.0)
Hemoglobin: 13.3 g/dL (ref 12.0–15.0)
Lymphocytes Relative: 45 % (ref 12–46)
Lymphs Abs: 2.4 10*3/uL (ref 0.7–4.0)
MCH: 28.7 pg (ref 26.0–34.0)
MCHC: 33.4 g/dL (ref 30.0–36.0)
MCV: 85.8 fL (ref 78.0–100.0)
Monocytes Absolute: 0.8 10*3/uL (ref 0.1–1.0)
Monocytes Relative: 15 % — ABNORMAL HIGH (ref 3–12)
Neutro Abs: 2.1 10*3/uL (ref 1.7–7.7)
Neutrophils Relative %: 39 % — ABNORMAL LOW (ref 43–77)
Platelets: 337 10*3/uL (ref 150–400)
RBC: 4.64 MIL/uL (ref 3.87–5.11)
RDW: 14.4 % (ref 11.5–15.5)
WBC: 5.3 10*3/uL (ref 4.0–10.5)

## 2014-01-01 LAB — URINALYSIS, ROUTINE W REFLEX MICROSCOPIC
Glucose, UA: NEGATIVE mg/dL
Hgb urine dipstick: NEGATIVE
Ketones, ur: NEGATIVE mg/dL
Leukocytes, UA: NEGATIVE
Nitrite: NEGATIVE
Protein, ur: NEGATIVE mg/dL
Specific Gravity, Urine: 1.029 (ref 1.005–1.030)
Urobilinogen, UA: 1 mg/dL (ref 0.0–1.0)
pH: 6 (ref 5.0–8.0)

## 2014-01-01 LAB — BASIC METABOLIC PANEL
Anion gap: 13 (ref 5–15)
BUN: 10 mg/dL (ref 6–23)
CO2: 22 mEq/L (ref 19–32)
Calcium: 9.5 mg/dL (ref 8.4–10.5)
Chloride: 99 mEq/L (ref 96–112)
Creatinine, Ser: 0.87 mg/dL (ref 0.50–1.10)
GFR calc Af Amer: >90 mL/min (ref >90)
GFR calc non Af Amer: 86 mL/min — ABNORMAL LOW (ref >90)
Glucose, Bld: 97 mg/dL (ref 70–99)
Potassium: 4.2 mEq/L (ref 3.7–5.3)
Sodium: 134 mEq/L — ABNORMAL LOW (ref 137–147)

## 2014-01-01 LAB — RAPID HIV SCREEN (WH-MAU): Rapid HIV Screen: NONREACTIVE

## 2014-01-01 MED ORDER — TRAMADOL HCL 50 MG PO TABS
50.0000 mg | ORAL_TABLET | Freq: Once | ORAL | Status: AC
  Administered 2014-01-01: 50 mg via ORAL
  Filled 2014-01-01: qty 1

## 2014-01-01 MED ORDER — IBUPROFEN 200 MG PO TABS
400.0000 mg | ORAL_TABLET | Freq: Once | ORAL | Status: AC
  Administered 2014-01-01: 400 mg via ORAL
  Filled 2014-01-01: qty 2

## 2014-01-01 MED ORDER — TRAMADOL HCL 50 MG PO TABS: 50.0000 mg | ORAL_TABLET | Freq: Four times a day (QID) | ORAL | Status: DC | PRN

## 2014-01-01 NOTE — ED Provider Notes (Signed)
CSN: [id number]     Arrival date & time 01/01/14  0813 History   First MD Initiated Contact with Patient 01/01/14 0825     Chief Complaint  Patient presents with  . Rib Pain      (Consider location/radiation/quality/duration/timing/severity/associated sxs/prior Treatment) HPI   33yF with b/l chest/flank pain. Persistent for the past 2 weeks. Initially she describes URI symptoms with cough, runny nose and feeling congested. This has since resolved, but she continues to have pain in her bilateral chest. Worse with certain movements also with deep inspiration. No shortness of breath. No cough. No fevers or chills. No unusual leg pain or swelling. Denies past history DVT/pulmonary embolism. Has been taking Advil with only mild relief. Nausea and has vomited, although not within the past several days. Denies any significant abdominal pain. No urinary complaints.   Past Medical History  Diagnosis Date  . Ovarian cyst   . Viral labyrinthitis   . DENTAL PAIN   . IRREGULAR MENSTRUATION   . FEMALE INFERTILITY   . BOILS, RECURRENT   . SEPTATE UTERUS   . Endometriosis   . Breast tenderness in female    Past Surgical History  Procedure Laterality Date  . Ovarian cyst removal  Mar 25 2010  . Biopsy of right breast    . Breast biopsy  1999    right  . Ovarian cyst removal  03/25/2010    right   Family History  Problem Relation Age of Onset  . Hypertension Mother   . Diabetes Mother   . Cancer Father   . Hypertension Father   . Diabetes Brother   . Hypertension Brother    History  Substance Use Topics  . Smoking status: Current Every Day Smoker -- 0.50 packs/day for 10 years    Types: Cigarettes  . Smokeless tobacco: Never Used  . Alcohol Use: Yes     Comment: occas.   OB History    Gravida Para Term Preterm AB TAB SAB Ectopic Multiple Living   0         0     Review of Systems  All systems reviewed and negative, other than as noted in HPI.   Allergies  Flagyl  Home  Medications   Prior to Admission medications   Medication Sig Start Date End Date Taking? Authorizing Provider  acetaminophen-codeine 120-12 MG/5ML solution Take 10 mLs by mouth every 4 (four) hours as needed for moderate pain. And cough 12/24/13   Resa Miner Lawyer, PA-C  clindamycin (CLEOCIN) 300 MG capsule Take 1 capsule (300 mg total) by mouth 2 (two) times daily. Patient not taking: Reported on 12/24/2013 09/04/13   Waldemar Dickens, MD  clindamycin (CLEOCIN) 300 MG capsule 1 BID Patient not taking: Reported on 12/24/2013 10/21/13   Harden Mo, MD  clindamycin (CLEOCIN) 300 MG capsule Take 1 capsule (300 mg total) by mouth 2 (two) times daily. Patient not taking: Reported on 12/24/2013 11/22/13   Harden Mo, MD  fluconazole (DIFLUCAN) 150 MG tablet Take 1 tablet (150 mg total) by mouth daily. Repeat dose in 3 days Patient not taking: Reported on 12/24/2013 09/04/13   Waldemar Dickens, MD  Guaifenesin 1200 MG TB12 Take 1 tablet (1,200 mg total) by mouth 2 (two) times daily. 12/24/13   Resa Miner Lawyer, PA-C  ibuprofen (ADVIL,MOTRIN) 800 MG tablet Take 1 tablet (800 mg total) by mouth 3 (three) times daily. Take with food Patient not taking: Reported on 12/24/2013 06/10/13   Ander Purpura  Parker, PA-C  ketorolac (ACULAR) 0.5 % ophthalmic solution Place 1 drop into both eyes every 6 (six) hours. Patient not taking: Reported on 12/24/2013 11/20/13   Harden Mo, MD  phenazopyridine (PYRIDIUM) 100 MG tablet Take 1 tablet (100 mg total) by mouth 3 (three) times daily as needed for pain. Patient not taking: Reported on 12/24/2013 09/04/13   Waldemar Dickens, MD  predniSONE (DELTASONE) 50 MG tablet Take 1 tablet (50 mg total) by mouth daily. 12/24/13   Resa Miner Lawyer, PA-C   BP 114/77 mmHg  Pulse 89  Temp(Src) 98 F (36.7 C) (Oral)  Resp 16  Wt 180 lb (81.647 kg)  SpO2 99%  [name] 12/01/2013 Physical Exam  Constitutional: She is oriented to person, place, and time. She appears  well-developed and well-nourished. No distress.  Laying in bed. No acute distress.  HENT:  Head: Normocephalic and atraumatic.  Eyes: Conjunctivae are normal. Right eye exhibits no discharge. Left eye exhibits no discharge.  Neck: Neck supple.  Cardiovascular: Normal rate, regular rhythm and normal heart sounds.  Exam reveals no gallop and no friction rub.   No murmur heard. Pulmonary/Chest: Effort normal and breath sounds normal. No respiratory distress.  Abdominal: Soft. She exhibits no distension. There is no tenderness.  Genitourinary:  No CVA tenderness  Musculoskeletal: She exhibits no edema or tenderness.  Lower extremities symmetric as compared to each other. No calf tenderness. Negative Homan's. No palpable cords.   Neurological: She is alert and oriented to person, place, and time.  Skin: Skin is warm and dry.  Psychiatric: She has a normal mood and affect. Her behavior is normal. Thought content normal.  Nursing note and vitals reviewed.   ED Course  Procedures (including critical care time) Labs Review Labs Reviewed  URINALYSIS, ROUTINE W REFLEX MICROSCOPIC - Abnormal; Notable for the following:    Color, Urine AMBER (*)    APPearance CLOUDY (*)    Bilirubin Urine SMALL (*)    All other components within normal limits  CBC WITH DIFFERENTIAL - Abnormal; Notable for the following:    Neutrophils Relative % 39 (*)    Monocytes Relative 15 (*)    All other components within normal limits  BASIC METABOLIC PANEL - Abnormal; Notable for the following:    Sodium 134 (*)    GFR calc non Af Amer 86 (*)    All other components within normal limits  RAPID HIV SCREEN [facility])    Imaging Review Dg Chest 2 View  01/01/2014   CLINICAL DATA:  Acute anterior chest pain  EXAM: CHEST  2 VIEW  COMPARISON:  12/24/2013  FINDINGS: The heart size and mediastinal contours are within normal limits. Both lungs are clear. The visualized skeletal structures are unremarkable.  IMPRESSION: No  active cardiopulmonary disease.   Electronically Signed   By: Daryll Brod M.D.   On: 01/01/2014 08:59     EKG Interpretation   Date/Time:  Monday January 01 2014 09:24:27 EST Ventricular Rate:  74 PR Interval:  142 QRS Duration: 73 QT Interval:  361 QTC Calculation: 400 R Axis:   59 Text Interpretation:  Sinus rhythm No significant change since last  tracing Confirmed by Ota Ebersole  MD, [place] ([id number]) on 01/01/2014 10:20:21 AM      MDM   Final diagnoses:  Chest pain    33 year old female with what I suspect is chest wall pain related to recent coughing/URI symptoms. She is afebrile. She appears well. Chest pain is reproducible with certain movements.  Doubt ACS. Doubt PE, dissection for SBI. Pt additionally requesting HIV testing. Is sexually active. Recently seen in ED and had pelvic exam and GC testing. Negative. Rapid HIV last month negative. WIll check again today per her request. Discussed that seroconversion takes time and that if she has concerns she may have been exposed within in the last few weeks then she needs repeat testing even if negative today.     Virgel Manifold, MD 01/01/14 1020

## 2014-01-01 NOTE — ED Notes (Signed)
Pt alert, arrives from home, c/o bilateral rib pain, onset was several weeks ago, denies trauma or injury, denies SOB or cough, seen at [facility], returns with similar c/o, resp even unlabored, skin pwd

## 2014-01-01 NOTE — Discharge Instructions (Signed)
Chest Pain (Nonspecific) °It is often hard to give a specific diagnosis for the cause of chest pain. There is always a chance that your pain could be related to something serious, such as a heart attack or a blood clot in the lungs. You need to follow up with your health care provider for further evaluation. °CAUSES  °· Heartburn. °· Pneumonia or bronchitis. °· Anxiety or stress. °· Inflammation around your heart (pericarditis) or lung (pleuritis or pleurisy). °· A blood clot in the lung. °· A collapsed lung (pneumothorax). It can develop suddenly on its own (spontaneous pneumothorax) or from trauma to the chest. °· Shingles infection (herpes zoster virus). °The chest wall is composed of bones, muscles, and cartilage. Any of these can be the source of the pain. °· The bones can be bruised by injury. °· The muscles or cartilage can be strained by coughing or overwork. °· The cartilage can be affected by inflammation and become sore (costochondritis). °DIAGNOSIS  °Lab tests or other studies may be needed to find the cause of your pain. Your health care provider may have you take a test called an ambulatory electrocardiogram (ECG). An ECG records your heartbeat patterns over a 24-hour period. You may also have other tests, such as: °· Transthoracic echocardiogram (TTE). During echocardiography, sound waves are used to evaluate how blood flows through your heart. °· Transesophageal echocardiogram (TEE). °· Cardiac monitoring. This allows your health care provider to monitor your heart rate and rhythm in real time. °· Holter monitor. This is a portable device that records your heartbeat and can help diagnose heart arrhythmias. It allows your health care provider to track your heart activity for several days, if needed. °· Stress tests by exercise or by giving medicine that makes the heart beat faster. °TREATMENT  °· Treatment depends on what may be causing your chest pain. Treatment may include: °¨ Acid blockers for  heartburn. °¨ Anti-inflammatory medicine. °¨ Pain medicine for inflammatory conditions. °¨ Antibiotics if an infection is present. °· You may be advised to change lifestyle habits. This includes stopping smoking and avoiding alcohol, caffeine, and chocolate. °· You may be advised to keep your head raised (elevated) when sleeping. This reduces the chance of acid going backward from your stomach into your esophagus. °Most of the time, nonspecific chest pain will improve within 2-3 days with rest and mild pain medicine.  °HOME CARE INSTRUCTIONS  °· If antibiotics were prescribed, take them as directed. Finish them even if you start to feel better. °· For the next few days, avoid physical activities that bring on chest pain. Continue physical activities as directed. °· Do not use any tobacco products, including cigarettes, chewing tobacco, or electronic cigarettes. °· Avoid drinking alcohol. °· Only take medicine as directed by your health care provider. °· Follow your health care provider's suggestions for further testing if your chest pain does not go away. °· Keep any follow-up appointments you made. If you do not go to an appointment, you could develop lasting (chronic) problems with pain. If there is any problem keeping an appointment, call to reschedule. °SEEK MEDICAL CARE IF:  °· Your chest pain does not go away, even after treatment. °· You have a rash with blisters on your chest. °· You have a fever. °SEEK IMMEDIATE MEDICAL CARE IF:  °· You have increased chest pain or pain that spreads to your arm, neck, jaw, back, or abdomen. °· You have shortness of breath. °· You have an increasing cough, or you cough   up blood. °· You have severe back or abdominal pain. °· You feel nauseous or vomit. °· You have severe weakness. °· You faint. °· You have chills. °This is an emergency. Do not wait to see if the pain will go away. Get medical help at once. Call your local emergency services (911 in [place] Do not drive  yourself to the hospital. °MAKE SURE YOU:  °· Understand these instructions. °· Will watch your condition. °· Will get help right away if you are not doing well or get worse. °Document Released: 11/05/2004 Document Revised: 01/31/2013 Document Reviewed: 09/01/2007 °ExitCare® Patient Information ©2015 [employer]. This information is not intended to replace advice given to you by your health care provider. Make sure you discuss any questions you have with your health care provider. ° ° °Emergency Department Resource Guide °1) Find a Doctor and Pay Out of Pocket °Although you won't have to find out who is covered by your insurance plan, it is a good idea to ask around and get recommendations. You will then need to call the office and see if the doctor you have chosen will accept you as a new patient and what types of options they offer for patients who are self-pay. Some doctors offer discounts or will set up payment plans for their patients who do not have insurance, but you will need to ask so you aren't surprised when you get to your appointment. ° °2) Contact Your Local Health Department °Not all health departments have doctors that can see patients for sick visits, but many do, so it is worth a call to see if yours does. If you don't know where your local health department is, you can check in your phone book. The CDC also has a tool to help you locate your state's health department, and many state websites also have listings of all of their local health departments. ° °3) Find a Walk-in Clinic °If your illness is not likely to be very severe or complicated, you may want to try a walk in clinic. These are popping up all over the country in pharmacies, drugstores, and shopping centers. They're usually staffed by nurse practitioners or physician assistants that have been trained to treat common illnesses and complaints. They're usually fairly quick and inexpensive. However, if you have serious medical issues or  chronic medical problems, these are probably not your best option. ° °No Primary Care Doctor: °- Call [facility] at  832-8000 - they can help you locate a primary care doctor that  accepts your insurance, provides certain services, etc. °- Physician Referral Service- 1-800-533-3463 ° °Chronic Pain Problems: °Organization         Address  Phone   Notes  °[facility]  (336) 297-2271 Patients need to be referred by their primary care doctor.  ° °Medication Assistance: °Organization         Address  Phone   Notes  °[place] [facility] [address] °[place], [place] [address] (336) 641-8030 --Must be a resident of [place] °-- Must have NO insurance coverage whatsoever (no Medicaid/ Medicare, etc.) °-- The pt. MUST have a primary care doctor that directs their care regularly and follows them in the community °  °[facility]  (866) 331-1348   °[employer]  (888) 892-1162   ° °Agencies that provide inexpensive medical care: °Organization         Address  Phone   Notes  °[facility]  (  336) 832-8035   °[facility]    (336) 832-7272   °[facility] [address] °[place], [place] [address] (336) 832-4777   °[facility] [address], °[place] (336) 271-4999   °[facility]    (336) 373-0678   °[facility]    (336) 272-1050   °[facility] ° [address], [place] Phone:  (336) 832-4444, Fax:  (336) 832-4440 Hours of Operation:  9 am - 6 pm, M-F.  Also accepts Medicaid/Medicare and self-pay.  °[facility] ° [address], [place] Phone: (336) 832-3150, Fax: (336) 832-3151. Hours of Operation:  8:30 am - 5:30 pm, M-F.  Also accepts Medicaid and self-pay.  °[facility] [place] [address], [place] Phone: (336) 878-6027   °[facility] [address], [place], [place]  (336)723-1848, Ext. 123 Mondays & Thursdays: 7-9 AM.  First 15 patients are seen on a first come, first serve basis. °  ° °Medicaid-accepting [place] Providers: ° °Organization         Address  Phone   Notes  °[facility] [address], [place] (336) 641-2100 Also accepts self-pay patients.  °[facility] [address], [place] ° (336) 856-9996   °[facility] [address], [place] (336) 288-8857   °[facility] [address] [place] [address], [place] (336) 299-7000   °Veita Bland [address], [place]  ° (336) 373-1557 Only accepts [place] Access Medicaid patients after they have their name applied to their card.  ° °Self-Pay (no insurance) in [place]: ° °Organization         Address  Phone   Notes  °Sickle Cell Patients, [facility] [address], [place] (336) 832-1970   °[facility] [address], [place] (336) 832-4400   °[facility] ° [address], [place] (336) 992-4800   °[facility]/Dr. Osei-Bonsu ° [address] [place] [address], [place] or [address], [place] (336) 841-8500 Phone number for both [place] and [place] locations is the same.  °[facility] [address], [place] (336) 299-0000   °[facility] [address] [place] [address], [place] or [address] (336) 852-7530 °(336) 878-2260   °[facility] [address], [place] (336) 350-1642, phone; (336) 294-5005, fax Sees patients 1st and 3rd Saturday of every month.  Must not qualify for public or private insurance (i.e. Medicaid, Medicare, [place] Health Choice, Veterans' Benefits) • Household income should be no more than 200% of the poverty level •The clinic cannot treat you if you are pregnant or think you are pregnant • Sexually transmitted  diseases are not treated at the clinic.  ° ° °Dental Care: °Organization         Address  Phone  Notes  °[facility] [address], [place] (336) 641-6152 Accepts children up to age 21 who are enrolled in Medicaid or [place] Health Choice; pregnant women with a Medicaid card; and children who have applied for Medicaid or [place] Health Choice, but were declined, whose parents can pay a reduced fee at time of service.  °[facility] [place]    [address], [place] (336) 641-7733 Accepts children up to age 21 who are enrolled in Medicaid or [place] Health Choice; pregnant women with a Medicaid card; and children who have applied for Medicaid or [place] Health Choice, but were declined, whose parents can pay a reduced fee at time of service.  °[facility] ° [address], [place] (336) 641-4533 Patients are seen by appointment only. Walk-ins are not accepted. [facility] will see patients 18 years of age and older. °Monday - Tuesday (8am-5pm) °Most Wednesdays (8:30-5pm) °$30 per visit, cash only  °[facility] ° [address], [place] (336) 641-4533 Patients are seen by appointment only. Walk-ins are not accepted. [facility] will see patients 18 years of age and older. °One Wednesday Evening (Monthly: Volunteer Based).  $30 per visit, cash only  °[facility]  (919) 537-3737 for adults; Children under age 4, call Graduate Pediatric Dentistry at ([phone]) [phone]. Children aged 4-14, please call (919) [phone] to request a pediatric application. ° Dental services are provided in all areas of dental care including fillings, crowns and bridges, complete and partial dentures, implants, gum treatment, root canals, and extractions. Preventive care is also provided. Treatment is provided to both adults and children. °Patients are selected via a  lottery and there is often a waiting list. °  °[facility] [address], °[place] ° (336) 763-8833 [email or web address].[email or web address] °  °[facility] [address], [place], [place] (336)723-1848, Ext. 123 Second and Fourth Thursday of each month, opens at 6:30 AM; Clinic ends at 9 AM.  Patients are seen on a first-come first-served basis, and a limited number are seen during each clinic.  ° °[facility] ° [address], [place], [place] (336) 723-7904   Eligibility Requirements °You must have lived in [place], [place], or [place] counties for at least the last three months. °  You cannot be eligible for state or federal sponsored healthcare insurance, including Veterans Administration, Medicaid, or Medicare. °  You generally cannot be eligible for healthcare insurance through your employer.  °  How to apply: °Eligibility screenings are held every Tuesday and Wednesday afternoon from 1:00 pm until 4:00 pm. You do not need an appointment for the interview!  °[facility] [address], [place], [place] 336-631-2330   °[facility]  336-342-8273   °[facility]  336-703-3100   °[facility]  336-570-6415   ° °Behavioral Health Resources in the Community: °Intensive Outpatient Programs °Organization         Address  Phone  Notes  °[facility] [address], [place], [place] 336-878-6098   °[facility] [address], [place], [place] 336-832-9800   °[facility] [address], [place], [place] ° 336-882-2125   °[facility] [address],  °, [place] 1-800-853-5163 or 336-641-4981   °Substance Abuse Resources °Organization         Address  Phone  Notes  °[facility]  336-882-2125   °[facility]  336-784-9470   °[facility]  336-285-9073   °[facility]  336-845-3988   °Residential &  Outpatient Substance Abuse Program  1-800-659-3381   °Psychological Services °Organization         Address  Phone  Notes  °[facility]  336- 832-9600   °  [facility]  336- 378-7881   °[facility] [address], [place] 1-800-853-5163 or 336-641-4981   ° °Mobile Crisis Teams °Organization         Address  Phone  Notes  °[facility], Mobile Crisis Care Unit  1-877-626-1772   °Assertive °[facility] ° [address] [place], [place] 336-834-9664   °Sharon DeEsch [address] °[place] 336-554-5454   ° °Self-Help/Support Groups °Organization         Address  Phone             Notes  °[facility] - variety of support groups  336- 373-1402 Call for more information  °Narcotics Anonymous (NA), [facility] [address], °[place]  2 meetings at this location  ° °Residential Treatment Programs °Organization         Address  Phone  Notes  °[facility] [address],    °[place]  1-866-801-8205   °[facility] ° [address], [place], [place] 704-293-8524   °[facility] [address], [place] 336-845-3988 Admissions: 8am-3pm M-F  °[facility] [address]    °[place], [place] 336-841-1104   °[facility] [address], [place], [place] 336-379-7146   °[facility] [address]  °[place], [place] 336-285-9073   °[facility] Intensive Outpatient [address], [place], [place] 336-852-3033   °[facility] [address]  °[place], [place] 1-877-615-2722 or 336-784-9470   °[facility] [address] [place], [place] 336-227-7417 Accepts Medicaid  °[facility] [address]  ° [place] 1-800-659-3381 Substance Abuse/Addiction Treatment  ° °[place] Behavioral Health Resources °Organization          Address  Phone  Notes  °[facility]  (888) 581-9988   °Julie Brannon, PhD [address] [place], [place]   (336) 349-5553 or (336) 951-0000   °[facility]   [address] °[place], [place] (336) 349-4454   °[facility] [address], [place], [place] (336) 342-8316 Insurance/Medicaid/sponsorship through [facility]  °[facility] [address]                                    [place], [place] (336) 342-8316 Therapy/tele-psych/case  °[facility] [address]  ° [place], [place] (336) 349-2233    °Dr. Arfeen  (336) 349-4544   °[facility] [place]  [employer] [place] [facility] 1) [address], [place] °2) [address], [place] °3)  [address], [place] (336) 349-3220 °(336) 342-7768 ° °(336) 342-8140   °[place] [facility] (336) 342-1394 or (336) 342-3537 (After Hours)    ° ° ° °

## 2014-01-29 ENCOUNTER — Encounter (HOSPITAL_COMMUNITY): Payer: Self-pay | Admitting: Emergency Medicine

## 2014-01-29 ENCOUNTER — Emergency Department (INDEPENDENT_AMBULATORY_CARE_PROVIDER_SITE_OTHER)
Admission: EM | Admit: 2014-01-29 | Discharge: 2014-01-29 | Disposition: A | Discharge status: Home / Self Care | Payer: No Typology Code available for payment source | Source: Home / Self Care | Attending: Family Medicine | Admitting: Family Medicine

## 2014-01-29 DIAGNOSIS — I889 Nonspecific lymphadenitis, unspecified: Secondary | ICD-10-CM

## 2014-01-29 MED ORDER — AMOXICILLIN 500 MG PO CAPS: 500.0000 mg | ORAL_CAPSULE | Freq: Three times a day (TID) | ORAL | Status: DC

## 2014-01-29 NOTE — Discharge Instructions (Signed)
Drink lots of fluids, take all of medicine, use lozenges as needed.return if needed °

## 2014-01-29 NOTE — ED Notes (Signed)
C/o left ear pain onset 4-5 days Pain increases when she swallows Denies fevers Alert, no signs of acute distress.

## 2014-01-29 NOTE — ED Provider Notes (Signed)
CSN: [id number]     Arrival date & time 01/29/14  1510 History   First MD Initiated Contact with Patient 01/29/14 1553     Chief Complaint  Patient presents with  . Otalgia   (Consider location/radiation/quality/duration/timing/severity/associated sxs/prior Treatment) Patient is a 33 y.o. female presenting with pharyngitis. The history is provided by the patient.  Sore Throat This is a new problem. The current episode started more than 2 days ago. The problem has been gradually worsening. Pertinent negatives include no chest pain and no abdominal pain. The symptoms are aggravated by swallowing.    Past Medical History  Diagnosis Date  . Ovarian cyst   . Viral labyrinthitis   . DENTAL PAIN   . IRREGULAR MENSTRUATION   . FEMALE INFERTILITY   . BOILS, RECURRENT   . SEPTATE UTERUS   . Endometriosis   . Breast tenderness in female    Past Surgical History  Procedure Laterality Date  . Ovarian cyst removal  Mar 25 2010  . Biopsy of right breast    . Breast biopsy  1999    right  . Ovarian cyst removal  03/25/2010    right   Family History  Problem Relation Age of Onset  . Hypertension Mother   . Diabetes Mother   . Cancer Father   . Hypertension Father   . Diabetes Brother   . Hypertension Brother    History  Substance Use Topics  . Smoking status: Current Every Day Smoker -- 0.50 packs/day for 10 years    Types: Cigarettes  . Smokeless tobacco: Never Used  . Alcohol Use: Yes     Comment: occas.   OB History    Gravida Para Term Preterm AB TAB SAB Ectopic Multiple Living   0         0     Review of Systems  Constitutional: Negative.  Negative for fever.  HENT: Positive for ear pain. Negative for congestion, dental problem, ear discharge, facial swelling, postnasal drip and rhinorrhea.   Eyes: Negative.   Respiratory: Negative.   Cardiovascular: Negative for chest pain.  Gastrointestinal: Negative for abdominal pain.    Allergies  Flagyl  Home Medications    Prior to Admission medications   Medication Sig Start Date End Date Taking? Authorizing Provider  acetaminophen-codeine 120-12 MG/5ML solution Take 10 mLs by mouth every 4 (four) hours as needed for moderate pain. And cough Patient not taking: Reported on 01/01/2014 12/24/13   Resa Miner Lawyer, PA-C  amoxicillin (AMOXIL) 500 MG capsule Take 1 capsule (500 mg total) by mouth 3 (three) times daily. 01/29/14   Billy Fischer, MD  clindamycin (CLEOCIN) 300 MG capsule Take 1 capsule (300 mg total) by mouth 2 (two) times daily. 09/04/13   Waldemar Dickens, MD  clindamycin (CLEOCIN) 300 MG capsule 1 BID Patient not taking: Reported on 12/24/2013 10/21/13   Harden Mo, MD  clindamycin (CLEOCIN) 300 MG capsule Take 1 capsule (300 mg total) by mouth 2 (two) times daily. Patient not taking: Reported on 12/24/2013 11/22/13   Harden Mo, MD  fluconazole (DIFLUCAN) 150 MG tablet Take 1 tablet (150 mg total) by mouth daily. Repeat dose in 3 days Patient not taking: Reported on 12/24/2013 09/04/13   Waldemar Dickens, MD  Guaifenesin 1200 MG TB12 Take 1 tablet (1,200 mg total) by mouth 2 (two) times daily. Patient not taking: Reported on 01/01/2014 12/24/13   Resa Miner Lawyer, PA-C  ibuprofen (ADVIL,MOTRIN) 800 MG tablet Take 1  tablet (800 mg total) by mouth 3 (three) times daily. Take with food Patient not taking: Reported on 12/24/2013 06/10/13   Harvie Heck, PA-C  ketorolac (ACULAR) 0.5 % ophthalmic solution Place 1 drop into both eyes every 6 (six) hours. Patient not taking: Reported on 01/01/2014 11/20/13   Harden Mo, MD  phenazopyridine (PYRIDIUM) 100 MG tablet Take 1 tablet (100 mg total) by mouth 3 (three) times daily as needed for pain. Patient not taking: Reported on 12/24/2013 09/04/13   Waldemar Dickens, MD  prednisoLONE acetate (PRED FORTE) 1 % ophthalmic suspension Place 1 drop into the right eye as directed. Use 4 times daily for 3 days, then 3 times daily for 3 days, then 2 times  daily ofr 3 days and then 1 time a day for 3 days.    Historical Provider, MD  predniSONE (DELTASONE) 50 MG tablet Take 1 tablet (50 mg total) by mouth daily. Patient not taking: Reported on 01/01/2014 12/24/13   Resa Miner Lawyer, PA-C  traMADol (ULTRAM) 50 MG tablet Take 1 tablet (50 mg total) by mouth every 6 (six) hours as needed. 01/01/14   Virgel Manifold, MD   BP 114/77 mmHg  Pulse 84  Temp(Src) 97.9 F (36.6 C) (Oral)  Resp 16 Physical Exam  Constitutional: She is oriented to person, place, and time. She appears well-developed and well-nourished. No distress.  HENT:  Right Ear: External ear normal.  Left Ear: External ear normal.  Nose: Nose normal.  Mouth/Throat: Oropharynx is clear and moist.  Eyes: Conjunctivae are normal. Pupils are equal, round, and reactive to light.  Neck: Normal range of motion. Neck supple.  Cardiovascular: Regular rhythm, normal heart sounds and intact distal pulses.   Pulmonary/Chest: Effort normal and breath sounds normal.  Lymphadenopathy:    She has cervical adenopathy.  Neurological: She is alert and oriented to person, place, and time.  Skin: Skin is warm and dry.  Nursing note and vitals reviewed.   ED Course  Procedures (including critical care time) Labs Review Labs Reviewed - No data to display  Imaging Review No results found.   MDM   1. Cervical lymphadenitis        Billy Fischer, MD 01/29/14 [phone]

## 2014-02-20 ENCOUNTER — Other Ambulatory Visit: Payer: Self-pay | Admitting: Obstetrics & Gynecology

## 2014-02-20 DIAGNOSIS — N644 Mastodynia: Secondary | ICD-10-CM

## 2014-02-20 DIAGNOSIS — N63 Unspecified lump in unspecified breast: Secondary | ICD-10-CM

## 2014-02-22 ENCOUNTER — Telehealth: Payer: Self-pay | Admitting: *Deleted

## 2014-02-22 NOTE — Telephone Encounter (Signed)
[name] left a message stating she usually sees Dr. Roselie Awkward, but not in a while. States she is known for getting bacterial vaginosis and has been to urgent care several times since she came to clinic last and they gave her refills of same medicine. States she needs to get another refill because she knows she has BV again, but can't afford to go to Urgent care . Would like a call back to discuss.   Per chart review last seen in clinic by D. Conard Novak, CNM 09/20/13. Is allergic to flagyl. Has been prescribed cleocin in July, September and October.  Per protocol should be seen by provider but can discuss with Dr. Roselie Awkward. Called [name] and left a message we got her message and were calling back- please call clinic back.

## 2014-02-23 NOTE — Telephone Encounter (Addendum)
Pt returned our call yesterday @ 1627 and left message that she missed our call. Please call back.  1/19  1350  Per chart review, pt went to [facility] and had evaluation of vaginal discharge as well as ear pain. Wet prep pending and will be addressed by [facility] facility.

## 2014-02-26 ENCOUNTER — Encounter (HOSPITAL_COMMUNITY): Payer: Self-pay | Admitting: Emergency Medicine

## 2014-02-26 ENCOUNTER — Emergency Department (INDEPENDENT_AMBULATORY_CARE_PROVIDER_SITE_OTHER)
Admission: EM | Admit: 2014-02-26 | Discharge: 2014-02-26 | Disposition: A | Discharge status: Home / Self Care | Payer: Self-pay | Source: Home / Self Care | Attending: Family Medicine | Admitting: Family Medicine

## 2014-02-26 ENCOUNTER — Other Ambulatory Visit (HOSPITAL_COMMUNITY)
Admission: RE | Admit: 2014-02-26 | Discharge: 2014-02-26 | Disposition: A | Discharge status: Home / Self Care | Payer: Self-pay | Source: Ambulatory Visit | Attending: Family Medicine | Admitting: Family Medicine

## 2014-02-26 DIAGNOSIS — H9201 Otalgia, right ear: Secondary | ICD-10-CM

## 2014-02-26 DIAGNOSIS — Z113 Encounter for screening for infections with a predominantly sexual mode of transmission: Secondary | ICD-10-CM | POA: Insufficient documentation

## 2014-02-26 DIAGNOSIS — N760 Acute vaginitis: Secondary | ICD-10-CM | POA: Insufficient documentation

## 2014-02-26 MED ORDER — FLUCONAZOLE 150 MG PO TABS: 150.0000 mg | ORAL_TABLET | Freq: Once | ORAL | Status: DC

## 2014-02-26 NOTE — ED Notes (Signed)
C/ right ear pain x 5 days.  Denies fever.   Pt has tried sweet oil with no relief.    Also c/o vaginal discharge.  Denies irritation.  Mild odor.  Mild pelvic discomfort x 1 wk.  No otc treatments tried.

## 2014-02-26 NOTE — ED Provider Notes (Signed)
[name] is a 34 y.o. female who presents to Urgent Care today for right ear pain. Patient has a 5 day history of right ear pain associated with decreased hearing. She is concerned that there is a bug in her ear. She has tried over-the-counter Sweet oil which has not helped. No fevers or chills vomiting or diarrhea. Patient also notes a vaginal discharge present for a few days associated with or odor. No significant irritation. No treatment used yet. Patient does not tolerate oral metronidazole as it causes vomiting.   Past Medical History  Diagnosis Date  . Ovarian cyst   . Viral labyrinthitis   . DENTAL PAIN   . IRREGULAR MENSTRUATION   . FEMALE INFERTILITY   . BOILS, RECURRENT   . SEPTATE UTERUS   . Endometriosis   . Breast tenderness in female    Past Surgical History  Procedure Laterality Date  . Ovarian cyst removal  Mar 25 2010  . Biopsy of right breast    . Breast biopsy  1999    right  . Ovarian cyst removal  03/25/2010    right   History  Substance Use Topics  . Smoking status: Current Every Day Smoker -- 0.50 packs/day for 10 years    Types: Cigarettes  . Smokeless tobacco: Never Used  . Alcohol Use: Yes     Comment: occas.   ROS as above Medications: No current facility-administered medications for this encounter.   Current Outpatient Prescriptions  Medication Sig Dispense Refill  . amoxicillin (AMOXIL) 500 MG capsule Take 1 capsule (500 mg total) by mouth 3 (three) times daily. 30 capsule 0  . fluconazole (DIFLUCAN) 150 MG tablet Take 1 tablet (150 mg total) by mouth once. 1 tablet 1  . [DISCONTINUED] Norgestimate-Ethinyl Estradiol Triphasic (TRI-SPRINTEC) 0.18/0.215/0.25 MG-35 MCG tablet Take 1 tablet by mouth daily. 1 Package 11   Allergies  Allergen Reactions  . Flagyl [Metronidazole] Nausea And Vomiting     Exam:  BP 121/90 mmHg  Pulse 96  Temp(Src) 98.7 F (37.1 C) (Oral)  Resp 16  SpO2 99%  [name] 01/30/2014 Gen: Well NAD HEENT: EOMI,  MMM  both tympanic membranes bilaterally look normal appearing with no foreign bodies noted.   Lungs: Normal work of breathing. CTABL Heart: RRR no MRG Abd: NABS, Soft. Nondistended, Nontender Exts: Brisk capillary refill, warm and well perfused.  GYN: Vaginal discharge with clumpy white discharge. Normal-appearing cervix.  The patient's ear was irrigated. No foreign bodies or insects.  No results found for this or any previous visit (from the past 24 hour(s)). No results found.  Assessment and Plan: 34 y.o. female with  1) vaginitis likely yeast treat with fluconazole. Cytology pending. It is BV positive we'll treat with clindamycin cream as patient is tolerant of oral metronidazole. 2) otalgia. Watchful waiting NSAIDs follow-up as needed.  Discussed warning signs or symptoms. Please see discharge instructions. Patient expresses understanding.     Gregor Hams, MD 02/26/14 1044

## 2014-02-26 NOTE — Discharge Instructions (Signed)
Thank you for coming in today. Vaginitis Vaginitis is an inflammation of the vagina. It is most often caused by a change in the normal balance of the bacteria and yeast that live in the vagina. This change in balance causes an overgrowth of certain bacteria or yeast, which causes the inflammation. There are different types of vaginitis, but the most common types are:  Bacterial vaginosis.  Yeast infection (candidiasis).  Trichomoniasis vaginitis. This is a sexually transmitted infection (STI).  Viral vaginitis.  Atropic vaginitis.  Allergic vaginitis. CAUSES  The cause depends on the type of vaginitis. Vaginitis can be caused by:  Bacteria (bacterial vaginosis).  Yeast (yeast infection).  A parasite (trichomoniasis vaginitis)  A virus (viral vaginitis).  Low hormone levels (atrophic vaginitis). Low hormone levels can occur during pregnancy, breastfeeding, or after menopause.  Irritants, such as bubble baths, scented tampons, and feminine sprays (allergic vaginitis). Other factors can change the normal balance of the yeast and bacteria that live in the vagina. These include:  Antibiotic medicines.  Poor hygiene.  Diaphragms, vaginal sponges, spermicides, birth control pills, and intrauterine devices (IUD).  Sexual intercourse.  Infection.  Uncontrolled diabetes.  A weakened immune system. SYMPTOMS  Symptoms can vary depending on the cause of the vaginitis. Common symptoms include:  Abnormal vaginal discharge.  The discharge is white, gray, or yellow with bacterial vaginosis.  The discharge is thick, white, and cheesy with a yeast infection.  The discharge is frothy and yellow or greenish with trichomoniasis.  A bad vaginal odor.  The odor is fishy with bacterial vaginosis.  Vaginal itching, pain, or swelling.  Painful intercourse.  Pain or burning when urinating. Sometimes, there are no symptoms. TREATMENT  Treatment will vary depending on the type of  infection.   Bacterial vaginosis and trichomoniasis are often treated with antibiotic creams or pills.  Yeast infections are often treated with antifungal medicines, such as vaginal creams or suppositories.  Viral vaginitis has no cure, but symptoms can be treated with medicines that relieve discomfort. Your sexual partner should be treated as well.  Atrophic vaginitis may be treated with an estrogen cream, pill, suppository, or vaginal ring. If vaginal dryness occurs, lubricants and moisturizing creams may help. You may be told to avoid scented soaps, sprays, or douches.  Allergic vaginitis treatment involves quitting the use of the product that is causing the problem. Vaginal creams can be used to treat the symptoms. HOME CARE INSTRUCTIONS   Take all medicines as directed by your caregiver.  Keep your genital area clean and dry. Avoid soap and only rinse the area with water.  Avoid douching. It can remove the healthy bacteria in the vagina.  Do not use tampons or have sexual intercourse until your vaginitis has been treated. Use sanitary pads while you have vaginitis.  Wipe from front to back. This avoids the spread of bacteria from the rectum to the vagina.  Let air reach your genital area.  Wear cotton underwear to decrease moisture buildup.  Avoid wearing underwear while you sleep until your vaginitis is gone.  Avoid tight pants and underwear or nylons without a cotton panel.  Take off wet clothing (especially bathing suits) as soon as possible.  Use mild, non-scented products. Avoid using irritants, such as:  Scented feminine sprays.  Fabric softeners.  Scented detergents.  Scented tampons.  Scented soaps or bubble baths.  Practice safe sex and use condoms. Condoms may prevent the spread of trichomoniasis and viral vaginitis. SEEK MEDICAL CARE IF:  You have abdominal pain.  You have a fever or persistent symptoms for more than 2-3 days.  You have a fever and  your symptoms suddenly get worse. Document Released: 11/23/2006 Document Revised: 10/21/2011 Document Reviewed: 07/09/2011 [facility] Patient Information 2015 [place], [place]. This information is not intended to replace advice given to you by your health care provider. Make sure you discuss any questions you have with your health care provider.  Otalgia The most common reason for this in children is an infection of the middle ear. Pain from the middle ear is usually caused by a build-up of fluid and pressure behind the eardrum. Pain from an earache can be sharp, dull, or burning. The pain may be temporary or constant. The middle ear is connected to the nasal passages by a short narrow tube called the Eustachian tube. The Eustachian tube allows fluid to drain out of the middle ear, and helps keep the pressure in your ear equalized. CAUSES  A cold or allergy can block the Eustachian tube with inflammation and the build-up of secretions. This is especially likely in small children, because their Eustachian tube is shorter and more horizontal. When the Eustachian tube closes, the normal flow of fluid from the middle ear is stopped. Fluid can accumulate and cause stuffiness, pain, hearing loss, and an ear infection if germs start growing in this area. SYMPTOMS  The symptoms of an ear infection may include fever, ear pain, fussiness, increased crying, and irritability. Many children will have temporary and minor hearing loss during and right after an ear infection. Permanent hearing loss is rare, but the risk increases the more infections a child has. Other causes of ear pain include retained water in the outer ear canal from swimming and bathing. Ear pain in adults is less likely to be from an ear infection. Ear pain may be referred from other locations. Referred pain may be from the joint between your jaw and the skull. It may also come from a tooth problem or problems in the neck. Other causes of ear pain  include:  A foreign body in the ear.  Outer ear infection.  Sinus infections.  Impacted ear wax.  Ear injury.  Arthritis of the jaw or TMJ problems.  Middle ear infection.  Tooth infections.  Sore throat with pain to the ears. DIAGNOSIS  Your caregiver can usually make the diagnosis by examining you. Sometimes other special studies, including x-rays and lab work may be necessary. TREATMENT   If antibiotics were prescribed, use them as directed and finish them even if you or your child's symptoms seem to be improved.  Sometimes PE tubes are needed in children. These are little plastic tubes which are put into the eardrum during a simple surgical procedure. They allow fluid to drain easier and allow the pressure in the middle ear to equalize. This helps relieve the ear pain caused by pressure changes. HOME CARE INSTRUCTIONS   Only take over-the-counter or prescription medicines for pain, discomfort, or fever as directed by your caregiver. DO NOT GIVE CHILDREN ASPIRIN because of the association of Reye's Syndrome in children taking aspirin.  Use a cold pack applied to the outer ear for 15-20 minutes, 03-04 times per day or as needed may reduce pain. Do not apply ice directly to the skin. You may cause frost bite.  Over-the-counter ear drops used as directed may be effective. Your caregiver may sometimes prescribe ear drops.  Resting in an upright position may help reduce pressure in the middle  ear and relieve pain.  Ear pain caused by rapidly descending from high altitudes can be relieved by swallowing or chewing gum. Allowing infants to suck on a bottle during airplane travel can help.  Do not smoke in the house or near children. If you are unable to quit smoking, smoke outside.  Control allergies. SEEK IMMEDIATE MEDICAL CARE IF:   You or your child are becoming sicker.  Pain or fever relief is not obtained with medicine.  You or your child's symptoms (pain, fever, or  irritability) do not improve within 24 to 48 hours or as instructed.  Severe pain suddenly stops hurting. This may indicate a ruptured eardrum.  You or your children develop new problems such as severe headaches, stiff neck, difficulty swallowing, or swelling of the face or around the ear. Document Released: 09/13/2003 Document Revised: 04/20/2011 Document Reviewed: 01/18/2008 [facility] Patient Information 2015 [place], [place]. This information is not intended to replace advice given to you by your health care provider. Make sure you discuss any questions you have with your health care provider.

## 2014-02-27 ENCOUNTER — Other Ambulatory Visit: Payer: Self-pay | Admitting: Obstetrics & Gynecology

## 2014-02-27 ENCOUNTER — Other Ambulatory Visit: Payer: Self-pay

## 2014-02-27 DIAGNOSIS — N644 Mastodynia: Secondary | ICD-10-CM

## 2014-02-27 DIAGNOSIS — N63 Unspecified lump in unspecified breast: Secondary | ICD-10-CM

## 2014-02-27 LAB — CERVICOVAGINAL ANCILLARY ONLY
Chlamydia: NEGATIVE
Neisseria Gonorrhea: NEGATIVE
Wet Prep (BD Affirm): NEGATIVE
Wet Prep (BD Affirm): NEGATIVE
Wet Prep (BD Affirm): POSITIVE — AB

## 2014-02-28 ENCOUNTER — Other Ambulatory Visit: Payer: Self-pay | Admitting: Obstetrics & Gynecology

## 2014-02-28 DIAGNOSIS — N644 Mastodynia: Secondary | ICD-10-CM

## 2014-02-28 DIAGNOSIS — N63 Unspecified lump in unspecified breast: Secondary | ICD-10-CM

## 2014-03-01 ENCOUNTER — Telehealth (HOSPITAL_COMMUNITY): Payer: Self-pay | Admitting: *Deleted

## 2014-03-01 ENCOUNTER — Ambulatory Visit
Admission: RE | Admit: 2014-03-01 | Discharge: 2014-03-01 | Disposition: A | Discharge status: Home / Self Care | Payer: No Typology Code available for payment source | Source: Ambulatory Visit | Attending: Obstetrics & Gynecology | Admitting: Obstetrics & Gynecology

## 2014-03-01 ENCOUNTER — Telehealth: Payer: Self-pay | Admitting: Family Medicine

## 2014-03-01 ENCOUNTER — Other Ambulatory Visit: Payer: Self-pay | Admitting: Obstetrics & Gynecology

## 2014-03-01 DIAGNOSIS — N63 Unspecified lump in unspecified breast: Secondary | ICD-10-CM

## 2014-03-01 DIAGNOSIS — N644 Mastodynia: Secondary | ICD-10-CM

## 2014-03-01 MED ORDER — CLINDAMYCIN PHOSPHATE 2 % VA CREA: 1.0000 | TOPICAL_CREAM | Freq: Every day | VAGINAL | Status: DC

## 2014-03-01 NOTE — ED Notes (Signed)
Call patient back with results. Call and clindamycin vaginal cream.  Gregor Hams, MD 03/01/14 [phone]

## 2014-03-01 NOTE — ED Notes (Signed)
GC/Chlamydia neg., Affirm: Candida and Trich neg., Gardnerella pos.  1/20 Message sent to Dr. Georgina Snell.  He e-prescribed Clindamycin vaginal cream to [facility] at [facility].  He notified the pt. I called the pt. back.  She said he told her about the Rx. but did not give her the rest of her results. Pt. verified x 2 and given results.  Pt.'s questions answered. [name] 03/01/2014

## 2014-03-07 MED ORDER — CLINDAMYCIN HCL 300 MG PO CAPS: 300.0000 mg | ORAL_CAPSULE | Freq: Two times a day (BID) | ORAL | Status: DC

## 2014-03-07 NOTE — ED Notes (Signed)
Patient was prescribed clindamycin vaginal cream for BV as she has not done well with oral clindamycin and is allergic to metronidazole. She called back stating that clindamycin vaginal cream was too expensive. I explained that the pills are likely not to be very effective however she states that she simply cannot afford $32 for the cream. I have sent in clindamycin tablets to the [facility] for her to take.  Gregor Hams, MD 03/07/14 1051

## 2014-03-08 ENCOUNTER — Encounter (HOSPITAL_COMMUNITY): Payer: Self-pay | Admitting: *Deleted

## 2014-03-08 ENCOUNTER — Inpatient Hospital Stay (HOSPITAL_COMMUNITY)
Admission: AD | Admit: 2014-03-08 | Discharge: 2014-03-08 | Disposition: A | Discharge status: Home / Self Care | Payer: Self-pay | Source: Ambulatory Visit | Attending: Obstetrics & Gynecology | Admitting: Obstetrics & Gynecology

## 2014-03-08 DIAGNOSIS — N760 Acute vaginitis: Secondary | ICD-10-CM | POA: Insufficient documentation

## 2014-03-08 DIAGNOSIS — B9689 Other specified bacterial agents as the cause of diseases classified elsewhere: Secondary | ICD-10-CM

## 2014-03-08 DIAGNOSIS — L739 Follicular disorder, unspecified: Secondary | ICD-10-CM | POA: Insufficient documentation

## 2014-03-08 DIAGNOSIS — F17210 Nicotine dependence, cigarettes, uncomplicated: Secondary | ICD-10-CM | POA: Insufficient documentation

## 2014-03-08 LAB — WET PREP, GENITAL
Trich, Wet Prep: NONE SEEN
YEAST WET PREP: NONE SEEN

## 2014-03-08 NOTE — MAU Provider Note (Signed)
History     CSN: [id number]  Arrival date and time: 03/08/14 4403   First Provider Initiated Contact with Patient 03/08/14 1002      Chief Complaint  Patient presents with  . Vaginitis   The history is provided by the patient and medical records.   [name] is a 34 y.o. G0P0 with history of recent BV diagnosis, not yet treated, who presents to the MAU with concern of itchy bumps on her vagina. States she noticed the bumps a couple of months ago, but they have become itchy 4 days ago. Endorses possible chills 3 days ago and vaginal discharge, but unchanged since BV diagnosis. Denies fever, nausea, vomiting, diarrhea, constipation, urinary symptoms, vaginal bleeding, dyspaurenia, abdominal or back pain.  [name] on 03/02/2014, no new partners but concerned about STI exposure and requests retesting despite recent tests at urgent care on 02/26/2014. Has not treated with anything yet.    OB History    Gravida Para Term Preterm AB TAB SAB Ectopic Multiple Living   0         0      Past Medical History  Diagnosis Date  . Ovarian cyst   . Viral labyrinthitis   . DENTAL PAIN   . IRREGULAR MENSTRUATION   . FEMALE INFERTILITY   . BOILS, RECURRENT   . SEPTATE UTERUS   . Endometriosis   . Breast tenderness in female     Past Surgical History  Procedure Laterality Date  . Ovarian cyst removal  Mar 25 2010  . Biopsy of right breast    . Breast biopsy  1999    right  . Ovarian cyst removal  03/25/2010    right    Family History  Problem Relation Age of Onset  . Hypertension Mother   . Diabetes Mother   . Cancer Father   . Hypertension Father   . Diabetes Brother   . Hypertension Brother     History  Substance Use Topics  . Smoking status: Current Every Day Smoker -- 0.50 packs/day for 10 years    Types: Cigarettes  . Smokeless tobacco: Never Used  . Alcohol Use: Yes     Comment: occas.    Allergies:  Allergies  Allergen Reactions  . Flagyl [Metronidazole]  Nausea And Vomiting    Prescriptions prior to admission  Medication Sig Dispense Refill Last Dose  . Probiotic CAPS Take 1 capsule by mouth daily.   03/07/2014 at Unknown time  . amoxicillin (AMOXIL) 500 MG capsule Take 1 capsule (500 mg total) by mouth 3 (three) times daily. (Patient not taking: Reported on 03/08/2014) 30 capsule 0 Completed Course at Unknown time  . clindamycin (CLEOCIN) 2 % vaginal cream Place 1 Applicatorful vaginally at bedtime. (Patient not taking: Reported on 03/08/2014) 40 g 0 Not Taking at Unknown time  . clindamycin (CLEOCIN) 300 MG capsule Take 1 capsule (300 mg total) by mouth 2 times daily at 12 noon and 4 pm. (Patient not taking: Reported on 03/08/2014) 14 capsule 1 Not Taking at Unknown time  . fluconazole (DIFLUCAN) 150 MG tablet Take 1 tablet (150 mg total) by mouth once. (Patient not taking: Reported on 03/08/2014) 1 tablet 1 Not Taking at Unknown time    ROS  Pertinent negatives and positives included in HPI.  Physical Exam   Blood pressure 122/79, pulse 92, temperature 98.4 F (36.9 C), resp. rate 18, height 5\' 2"  (1.575 m), weight 177 lb 3.2 oz (80.377 kg), last menstrual period 01/30/2014.  Physical Exam  Constitutional: She is oriented to person, place, and time. She appears well-developed and well-nourished. No distress.  HENT:  Head: Normocephalic.  Cardiovascular: Normal rate and regular rhythm.   Respiratory: Effort normal. No respiratory distress.  GI: Soft.  Genitourinary:    There is no rash, tenderness, lesion or injury on the right labia. There is tenderness (Tenderness over papules. ) and lesion (3 papules at the superior aspect of labia minora, one draining with yellowish-white discharge. No erythema or edema over surrounding area. ) on the left labia. There is no rash or injury on the left labia. Cervix exhibits no motion tenderness, no discharge and no friability. Right adnexum displays no mass, no tenderness and no fullness. Left adnexum  displays no mass, no tenderness and no fullness. No erythema, tenderness or bleeding in the vagina. No foreign body around the vagina. No signs of injury around the vagina. Vaginal discharge (whitish yellow thin discharge. moderate amount. No clumps. ) found.  Neurological: She is alert and oriented to person, place, and time.  Skin: Skin is warm and dry.    MAU Course  Procedures  MDM  Tested for GC/Chlamydia, HIV, RPR and HSV per patient request.  One of the papules draining on examination, D/C cultured.   Results for orders placed or performed during the hospital encounter of 03/08/14 (from the past 24 hour(s))  Wet prep, genital     Status: Abnormal   Collection Time: 03/08/14 10:22 AM  Result Value Ref Range   Yeast Wet Prep HPF POC NONE SEEN NONE SEEN   Trich, Wet Prep NONE SEEN NONE SEEN   Clue Cells Wet Prep HPF POC FEW (A) NONE SEEN   WBC, Wet Prep HPF POC FEW (A) NONE SEEN    Assessment and Plan  1. Folliculitis.   Plan:  Discharge home.  Take clindamycin rx for BV at urgent care, may help with folliculitis as well.  Use warm compresses for the papules, acetaminophen or NSAIDs for pain relief.  Will call with STI or culture results if positive to initiate treatment.  F/U with GYN clinic if symptoms continue.  Return to MAU as needed.   Misior, Betsey Holiday 03/08/2014, 12:05 PM   I have seen this patient and agree with the above PA student's note.  LEFTWICH[place]KIRBY, [place] Certified Nurse-Midwife

## 2014-03-08 NOTE — Discharge Instructions (Signed)
TAKE YOUR MEDICATION (CLINDAMYCIN) FOR BV.  USE WARM COMPRESSES FOR FOLLICULITIS. IF PAIN CONTROL NEEDED, TAKE acetaminophen (Tylenol) or ibuprofen as needed.   Folliculitis  Folliculitis is redness, soreness, and swelling (inflammation) of the hair follicles. This condition can occur anywhere on the body. People with weakened immune systems, diabetes, or obesity have a greater risk of getting folliculitis. CAUSES  Bacterial infection. This is the most common cause.  Fungal infection.  Viral infection.  Contact with certain chemicals, especially oils and tars. Long-term folliculitis can result from bacteria that live in the nostrils. The bacteria may trigger multiple outbreaks of folliculitis over time. SYMPTOMS Folliculitis most commonly occurs on the scalp, thighs, legs, back, buttocks, and areas where hair is shaved frequently. An early sign of folliculitis is a small, white or yellow, pus-filled, itchy lesion (pustule). These lesions appear on a red, inflamed follicle. They are usually less than 0.2 inches (5 mm) wide. When there is an infection of the follicle that goes deeper, it becomes a boil or furuncle. A group of closely packed boils creates a larger lesion (carbuncle). Carbuncles tend to occur in hairy, sweaty areas of the body. DIAGNOSIS  Your caregiver can usually tell what is wrong by doing a physical exam. A sample may be taken from one of the lesions and tested in a lab. This can help determine what is causing your folliculitis. TREATMENT  Treatment may include:  Applying warm compresses to the affected areas.  Taking antibiotic medicines orally or applying them to the skin.  Draining the lesions if they contain a large amount of pus or fluid.  Laser hair removal for cases of long-lasting folliculitis. This helps to prevent regrowth of the hair. HOME CARE INSTRUCTIONS  Apply warm compresses to the affected areas as directed by your caregiver.  If antibiotics are  prescribed, take them as directed. Finish them even if you start to feel better.  You may take over-the-counter medicines to relieve itching.  Do not shave irritated skin.  Follow up with your caregiver as directed. SEEK IMMEDIATE MEDICAL CARE IF:   You have increasing redness, swelling, or pain in the affected area.  You have a fever. MAKE SURE YOU:  Understand these instructions.  Will watch your condition.  Will get help right away if you are not doing well or get worse. Document Released: 04/06/2001 Document Revised: 07/28/2011 Document Reviewed: 04/28/2011 [facility] Patient Information 2015 [place], [place]. This information is not intended to replace advice given to you by your health care provider. Make sure you discuss any questions you have with your health care provider.

## 2014-03-08 NOTE — MAU Note (Signed)
Pt reprots she noticed 2 bumps on the outside of her vagina about 2 days ago. C/o itching no burning or pain.

## 2014-03-09 LAB — RPR: RPR Ser Ql: NONREACTIVE

## 2014-03-09 LAB — HIV ANTIBODY (ROUTINE TESTING W REFLEX): HIV Screen 4th Generation wRfx: NONREACTIVE

## 2014-03-09 LAB — GC/CHLAMYDIA PROBE AMP (~~LOC~~) NOT AT ARMC
Chlamydia: NEGATIVE
Neisseria Gonorrhea: NEGATIVE

## 2014-03-10 LAB — WOUND CULTURE
Culture: NO GROWTH
GRAM STAIN: NONE SEEN
Special Requests: NORMAL

## 2014-03-12 LAB — HERPES SIMPLEX VIRUS CULTURE
Culture: NOT DETECTED
SPECIAL REQUESTS: NORMAL

## 2014-04-04 ENCOUNTER — Encounter (HOSPITAL_COMMUNITY): Payer: Self-pay | Admitting: *Deleted

## 2014-04-04 ENCOUNTER — Emergency Department (HOSPITAL_COMMUNITY)
Admission: EM | Admit: 2014-04-04 | Discharge: 2014-04-04 | Disposition: A | Discharge status: Home / Self Care | Payer: No Typology Code available for payment source | Attending: Emergency Medicine | Admitting: Emergency Medicine

## 2014-04-04 DIAGNOSIS — Z8742 Personal history of other diseases of the female genital tract: Secondary | ICD-10-CM | POA: Insufficient documentation

## 2014-04-04 DIAGNOSIS — L03111 Cellulitis of right axilla: Secondary | ICD-10-CM | POA: Insufficient documentation

## 2014-04-04 DIAGNOSIS — Z79899 Other long term (current) drug therapy: Secondary | ICD-10-CM | POA: Insufficient documentation

## 2014-04-04 DIAGNOSIS — L0291 Cutaneous abscess, unspecified: Secondary | ICD-10-CM

## 2014-04-04 DIAGNOSIS — Z792 Long term (current) use of antibiotics: Secondary | ICD-10-CM | POA: Insufficient documentation

## 2014-04-04 DIAGNOSIS — Z8719 Personal history of other diseases of the digestive system: Secondary | ICD-10-CM | POA: Insufficient documentation

## 2014-04-04 DIAGNOSIS — L02411 Cutaneous abscess of right axilla: Secondary | ICD-10-CM | POA: Insufficient documentation

## 2014-04-04 DIAGNOSIS — Q512 Other doubling of uterus: Secondary | ICD-10-CM | POA: Insufficient documentation

## 2014-04-04 DIAGNOSIS — L0390 Cellulitis, unspecified: Secondary | ICD-10-CM

## 2014-04-04 DIAGNOSIS — Z720 Tobacco use: Secondary | ICD-10-CM | POA: Insufficient documentation

## 2014-04-04 DIAGNOSIS — Z8709 Personal history of other diseases of the respiratory system: Secondary | ICD-10-CM | POA: Insufficient documentation

## 2014-04-04 MED ORDER — LIDOCAINE-EPINEPHRINE (PF) 2 %-1:200000 IJ SOLN
20.0000 mL | Freq: Once | INTRAMUSCULAR | Status: AC
  Administered 2014-04-04: 20 mL
  Filled 2014-04-04: qty 20

## 2014-04-04 MED ORDER — LORAZEPAM 0.5 MG PO TABS
1.0000 mg | ORAL_TABLET | Freq: Once | ORAL | Status: AC
  Administered 2014-04-04: 1 mg via ORAL
  Filled 2014-04-04: qty 2

## 2014-04-04 MED ORDER — HYDROCODONE-ACETAMINOPHEN 5-325 MG PO TABS: 1.0000 | ORAL_TABLET | ORAL | Status: DC | PRN

## 2014-04-04 MED ORDER — ONDANSETRON 4 MG PO TBDP
4.0000 mg | ORAL_TABLET | Freq: Once | ORAL | Status: AC
  Administered 2014-04-04: 4 mg via ORAL
  Filled 2014-04-04: qty 1

## 2014-04-04 MED ORDER — HYDROCODONE-ACETAMINOPHEN 5-325 MG PO TABS
1.0000 | ORAL_TABLET | Freq: Once | ORAL | Status: AC
  Administered 2014-04-04: 1 via ORAL
  Filled 2014-04-04: qty 1

## 2014-04-04 MED ORDER — DOXYCYCLINE HYCLATE 100 MG PO CAPS: 100.0000 mg | ORAL_CAPSULE | Freq: Two times a day (BID) | ORAL | Status: DC

## 2014-04-04 NOTE — ED Provider Notes (Signed)
CSN: [id number]     Arrival date & time 04/04/14  0808 History   First MD Initiated Contact with Patient 04/04/14 [phone]     Chief Complaint  Patient presents with  . Rash     (Consider location/radiation/quality/duration/timing/severity/associated sxs/prior Treatment) The history is provided by the patient.     Patient presents with two days of pain in her right axilla, pain is burning in nature, moderate to severe, constant, worse with palpation.  Denies drainage.  She has taken ibuprofen without relief.  Denies fevers, N/V, lightheadedness/dizziness.    Past Medical History  Diagnosis Date  . Ovarian cyst   . Viral labyrinthitis   . DENTAL PAIN   . IRREGULAR MENSTRUATION   . FEMALE INFERTILITY   . BOILS, RECURRENT   . SEPTATE UTERUS   . Endometriosis   . Breast tenderness in female    Past Surgical History  Procedure Laterality Date  . Ovarian cyst removal  Mar 25 2010  . Biopsy of right breast    . Breast biopsy  1999    right  . Ovarian cyst removal  03/25/2010    right   Family History  Problem Relation Age of Onset  . Hypertension Mother   . Diabetes Mother   . Cancer Father   . Hypertension Father   . Diabetes Brother   . Hypertension Brother    History  Substance Use Topics  . Smoking status: Current Every Day Smoker -- 0.50 packs/day for 10 years    Types: Cigarettes  . Smokeless tobacco: Never Used  . Alcohol Use: Yes     Comment: occas.   OB History    Gravida Para Term Preterm AB TAB SAB Ectopic Multiple Living   0         0     Review of Systems  Constitutional: Negative for fever and chills.  Respiratory: Negative for shortness of breath.   Cardiovascular: Negative for chest pain.  Gastrointestinal: Negative for nausea, vomiting, abdominal pain, diarrhea and constipation.  Skin: Positive for color change. Negative for wound.  Allergic/Immunologic: Negative for immunocompromised state.  Neurological: Negative for dizziness and  light-headedness.  Psychiatric/Behavioral: Negative for self-injury.      Allergies  Flagyl  Home Medications   Prior to Admission medications   Medication Sig Start Date End Date Taking? Authorizing Provider  amoxicillin (AMOXIL) 500 MG capsule Take 1 capsule (500 mg total) by mouth 3 (three) times daily. Patient not taking: Reported on 03/08/2014 01/29/14   Billy Fischer, MD  clindamycin (CLEOCIN) 2 % vaginal cream Place 1 Applicatorful vaginally at bedtime. Patient not taking: Reported on 03/08/2014 03/01/14   Gregor Hams, MD  clindamycin (CLEOCIN) 300 MG capsule Take 1 capsule (300 mg total) by mouth 2 times daily at 12 noon and 4 pm. Patient not taking: Reported on 03/08/2014 03/07/14   Gregor Hams, MD  fluconazole (DIFLUCAN) 150 MG tablet Take 1 tablet (150 mg total) by mouth once. Patient not taking: Reported on 03/08/2014 02/26/14   Gregor Hams, MD  Probiotic CAPS Take 1 capsule by mouth daily.    Historical Provider, MD   BP 96/72 mmHg  Pulse 82  Temp(Src) 98 F (36.7 C) (Oral)  Resp 18  Ht 5\' 2"  (1.575 m)  Wt 170 lb (77.111 kg)  BMI 31.09 kg/m2  SpO2 100% Physical Exam  Constitutional: She appears well-developed and well-nourished. No distress.  HENT:  Head: Normocephalic and atraumatic.  Neck: Neck supple.  Pulmonary/Chest:  Effort normal.  Neurological: She is alert.  Skin: She is not diaphoretic.  Right axilla with tenderness and induration, multiple scars and irregular skin surface from healing patterns.  Surrounding erythema and warmth.  Nursing note and vitals reviewed.   ED Course  Procedures (including critical care time) Labs Review Labs Reviewed - No data to display  Imaging Review No results found.   EKG Interpretation None       INCISION AND DRAINAGE Performed by: Clayton Bibles Consent: Verbal consent obtained. Risks and benefits: risks, benefits and alternatives were discussed Type: abscess  Body area: right axilla  Anesthesia: local  infiltration  Incision was made with a scalpel.  Local anesthetic: lidocaine 2% with epinephrine  Anesthetic total: 4 ml  Complexity: complex Blunt dissection to break up loculations  Drainage: purulent  Drainage amount: moderate  Packing material: none  Patient tolerance: Patient tolerated the procedure well with no immediate complications.     EMERGENCY DEPARTMENT US SOFT TISSUE INTERPRETATION "Study: Limited Ultrasound of the noted body part in comments below"  INDICATIONS: Pain Multiple views of the body part are obtained with a multi-frequency linear probe  PERFORMED BY:  Myself  IMAGES ARCHIVED?: Yes  SIDE:Right   BODY PART:Axilla  FINDINGS: Abcess present and Cellulitis present  LIMITATIONS:  Body Habitus and Emergent Procedure  INTERPRETATION:  Abcess present and Cellulitis present  COMMENT:  Abscess and cellulitis present.  Complicated exam secondary to skin irregularities and scarring.     MDM   Final diagnoses:  Abscess of right axilla    Afebrile, nontoxic patient with right axilla abscess with surrounding celllulitis, history of many abscesses in past.  No diagnosis of hydraadenitis suppurativa.   D/C home with doxycyline, norco, general surgery referral.  Discussed result, findings, treatment, and follow up  with patient.  Pt given return precautions.  Pt verbalizes understanding and agrees with plan.         Clayton Bibles, PA-C 04/04/14 1202  Wandra Arthurs, MD 04/05/14 [phone]

## 2014-04-04 NOTE — Discharge Instructions (Signed)
Read the information below.  Use the prescribed medication as directed.  Please discuss all new medications with your pharmacist.  Do not take additional tylenol while taking the prescribed pain medication to avoid overdose.  You may return to the Emergency Department at any time for worsening condition or any new symptoms that concern you.    If there is any possibility that you might be pregnant, please let your health care provider know and discuss this with the pharmacist to ensure medication safety.    If you develop increased redness, swelling, uncontrolled pain, or fevers greater than 100.4, return to the ER immediately for a recheck.      Abscess Care After An abscess (also called a boil or furuncle) is an infected area that contains a collection of pus. Signs and symptoms of an abscess include pain, tenderness, redness, or hardness, or you may feel a moveable soft area under your skin. An abscess can occur anywhere in the body. The infection may spread to surrounding tissues causing cellulitis. A cut (incision) by the surgeon was made over your abscess and the pus was drained out. Gauze may have been packed into the space to provide a drain that will allow the cavity to heal from the inside outwards. The boil may be painful for 5 to 7 days. Most people with a boil do not have high fevers. Your abscess, if seen early, may not have localized, and may not have been lanced. If not, another appointment may be required for this if it does not get better on its own or with medications. HOME CARE INSTRUCTIONS   Only take over-the-counter or prescription medicines for pain, discomfort, or fever as directed by your caregiver.  When you bathe, soak and then remove gauze or iodoform packs at least daily or as directed by your caregiver. You may then wash the wound gently with mild soapy water. Repack with gauze or do as your caregiver directs. SEEK IMMEDIATE MEDICAL CARE IF:   You develop increased pain,  swelling, redness, drainage, or bleeding in the wound site.  You develop signs of generalized infection including muscle aches, chills, fever, or a general ill feeling.  An oral temperature above 102 F (38.9 C) develops, not controlled by medication. See your caregiver for a recheck if you develop any of the symptoms described above. If medications (antibiotics) were prescribed, take them as directed. Document Released: 08/14/2004 Document Revised: 04/20/2011 Document Reviewed: 04/11/2007 [facility] Patient Information 2015 [place], [place]. This information is not intended to replace advice given to you by your health care provider. Make sure you discuss any questions you have with your health care provider.  Cellulitis Cellulitis is an infection of the skin and the tissue under the skin. The infected area is usually red and tender. This happens most often in the arms and lower legs. HOME CARE   Take your antibiotic medicine as told. Finish the medicine even if you start to feel better.  Keep the infected arm or leg raised (elevated).  Put a warm cloth on the area up to 4 times per day.  Only take medicines as told by your doctor.  Keep all doctor visits as told. GET HELP IF:  You see red streaks on the skin coming from the infected area.  Your red area gets bigger or turns a dark color.  Your bone or joint under the infected area is painful after the skin heals.  Your infection comes back in the same area or different area.  You have a puffy (swollen) bump in the infected area.  You have new symptoms.  You have a fever. GET HELP RIGHT AWAY IF:   You feel very sleepy.  You throw up (vomit) or have watery poop (diarrhea).  You feel sick and have muscle aches and pains. MAKE SURE YOU:   Understand these instructions.  Will watch your condition.  Will get help right away if you are not doing well or get worse. Document Released: 07/15/2007 Document Revised: 06/12/2013  Document Reviewed: 04/13/2011 [facility] Patient Information 2015 [place], [place]. This information is not intended to replace advice given to you by your health care provider. Make sure you discuss any questions you have with your health care provider.  Abscess An abscess (boil or furuncle) is an infected area on or under the skin. This area is filled with yellowish-white fluid (pus) and other material (debris). HOME CARE   Only take medicines as told by your doctor.  If you were given antibiotic medicine, take it as directed. Finish the medicine even if you start to feel better.  If gauze is used, follow your doctor's directions for changing the gauze.  To avoid spreading the infection:  Keep your abscess covered with a bandage.  Wash your hands well.  Do not share personal care items, towels, or whirlpools with others.  Avoid skin contact with others.  Keep your skin and clothes clean around the abscess.  Keep all doctor visits as told. GET HELP RIGHT AWAY IF:   You have more pain, puffiness (swelling), or redness in the wound site.  You have more fluid or blood coming from the wound site.  You have muscle aches, chills, or you feel sick.  You have a fever. MAKE SURE YOU:   Understand these instructions.  Will watch your condition.  Will get help right away if you are not doing well or get worse. Document Released: 07/15/2007 Document Revised: 07/28/2011 Document Reviewed: 04/10/2011 [facility] Patient Information 2015 [place], [place]. This information is not intended to replace advice given to you by your health care provider. Make sure you discuss any questions you have with your health care provider.    Emergency Department Resource Guide 1) Find a Doctor and Pay Out of Pocket Although you won't have to find out who is covered by your insurance plan, it is a good idea to ask around and get recommendations. You will then need to call the office and see if the doctor  you have chosen will accept you as a new patient and what types of options they offer for patients who are self-pay. Some doctors offer discounts or will set up payment plans for their patients who do not have insurance, but you will need to ask so you aren't surprised when you get to your appointment.  2) Contact Your Local Health Department Not all health departments have doctors that can see patients for sick visits, but many do, so it is worth a call to see if yours does. If you don't know where your local health department is, you can check in your phone book. The CDC also has a tool to help you locate your state's health department, and many state websites also have listings of all of their local health departments.  3) Find a [facility] If your illness is not likely to be very severe or complicated, you may want to try a walk in clinic. These are popping up all over the country in pharmacies, drugstores, and shopping  centers. They're usually staffed by nurse practitioners or physician assistants that have been trained to treat common illnesses and complaints. They're usually fairly quick and inexpensive. However, if you have serious medical issues or chronic medical problems, these are probably not your best option.  No Primary Care Doctor: - Call Health Connect at  [phone] - they can help you locate a primary care doctor that  accepts your insurance, provides certain services, etc. - Physician Referral Service- [phone]  Chronic Pain Problems: Organization         Address  Phone   Notes  [facility]  [phone] Patients need to be referred by their primary care doctor.   Medication Assistance: Organization         Address  Phone   Notes  [facility] Medication [facility] [address] [place], [place] [address] [phone] --Must be a resident of [facility] -- Must have NO insurance coverage whatsoever (no Medicaid/  Medicare, etc.) -- The pt. MUST have a primary care doctor that directs their care regularly and follows them in the community   MedAssist  [phone]   [employer]  [phone]    Agencies that provide inexpensive medical care: Organization         Address  Phone   Notes  [place]  [phone]   [facility]    [phone]   [facility] [place], [place] [address] [phone]   [place] [address] [place] [phone]   Planned Parenthood    [phone]   [facility]    [phone]   [facility], [place] Phone:  [phone], Fax:  [phone] Hours of Operation:  9 am - 6 pm, M-F.  Also accepts Medicaid/Medicare and self-pay.  [facility] for [place] [address], [address], [place] Phone: [phone], Fax: [phone]. Hours of Operation:  8:30 am - 5:30 pm, M-F.  Also accepts Medicaid and self-pay.  [facility] [place] [address] [place] Phone: [phone]   [place], [place], [place] [phone], Ext. [phone] Mondays & Thursdays: 7-9 AM.  First 15 patients are seen on a first come, first serve basis.    [place] Providers:  Organization         Address  Phone   Notes  [facility] [address], [place] [phone] Also accepts self-pay patients.  [facility] [address], [place]  [phone]   [place], [address], [place] [phone]   [facility] [address] [place] [phone]   Lucianne Lei [address], [place]   [phone] Only accepts [place] Access [place] patients after they have their name applied to their card.    Self-Pay (no insurance) in [facility]:  Organization         Address  Phone   Notes  Sickle Cell Patients, [facility] Internal [facility] [place] [phone]   [facility] [place] [phone]   [facility] [place]  [place] [address] [place], [place] [phone]  [facility]/Dr. Osei-Bonsu  [address] [place] or [address], [place] [phone] Phone number for both [place] and [place] locations is the same.  Urgent Medical and [facility] [address] [place] [phone]   [facility] [address], [place] or [address] [phone]   [facility] [address], [place] [phone], phone; [phone], fax Sees patients 1st and 3rd Saturday of every month.  Must not qualify for public or private insurance (i.e. Medicaid, Medicare, [place] Health Choice, Veterans' Benefits)  Household income should be no more than 200% of the poverty level The clinic cannot treat you if you are pregnant or think you are pregnant  Sexually transmitted diseases are not treated at the clinic.    Dental Care: Organization         Address  Phone  Notes  [facility] Department of [facility] [place] [phone] Accepts children up to age 28 who are enrolled in [place] or [place]; pregnant women with a Medicaid card; and children who have applied for Medicaid or [place] Health Choice, but were declined, whose parents can pay a reduced fee at time of service.  [facility] of [facility]  [address], [place] [phone] Accepts children up to age 77 who are enrolled in [place] or [place]; pregnant women with a Medicaid card; and children who have applied for Medicaid or [place] Health Choice,  but were declined, whose parents can pay a reduced fee at time of service.  [place] Adult Dental Access PROGRAM  [place] [phone] Patients are seen by appointment only. Walk-ins are not accepted. [place] will see patients 41 years of age and older. Monday - Tuesday (8am-5pm) Most Wednesdays (8:30-5pm) $30 per visit, cash only  [facility] Adult Dental Access PROGRAM  [address], [facility] [phone] Patients are seen by appointment only. Walk-ins are not accepted. [place] will see patients 4 years of age and older. One Wednesday Evening (Monthly: Volunteer Based).  $30 per visit, cash only  [place]  [phone] for adults; Children under age 42, call Graduate Pediatric Dentistry at [phone]. Children aged 57-14, please call [phone] to request a pediatric application.  Dental services are provided in all areas of dental care including fillings, crowns and bridges, complete and partial dentures, implants, gum treatment, root canals, and extractions. Preventive care is also provided. Treatment is provided to both adults and children. Patients are selected via a lottery and there is often a waiting list.   [facility] [address] [place]  [phone] [email or web address]   [facility] [address] [place], [place] [phone], Ext. [phone] Second and Fourth Thursday of each month, opens at 6:30 AM; Clinic ends at 9 AM.  Patients are seen on a first-come first-served basis, and a limited number are seen during each clinic.   [facility]  [address] Hillard Danker [place], [place] [phone]   Eligibility Requirements You must have lived in [place], [place], or [place] counties for at least the last three months.   You cannot be eligible for state or federal sponsored [employer], including [employer], [place], or [employer].   You generally  cannot be eligible for healthcare insurance  through your employer.    How to apply: Eligibility screenings are held every Tuesday and Wednesday afternoon from 1:00 pm until 4:00 pm. You do not need an appointment for the interview!  [facility] [address] [place], [place]   [place]  [place] Department  [place]  [phone]    Behavioral Health Resources in the Community: Intensive Outpatient Programs Organization         Address  Phone  Notes  [place] Vass. [address], [place], [place] [phone]   [facility] Outpatient [address], [place], [place]   ADS: Alcohol & Drug Svcs [address], [place], [place]   [place] [address],  [place], [place] or [phone]   Substance Abuse Resources Organization         Address  Phone  Notes  Alcohol and Drug Services  [phone]   [place]  [phone]   [facility] [place]   Chinita Pester  [phone]   Residential & Outpatient Substance Abuse Program  [phone]   Psychological Services Organization         Address  Phone  Notes  [facility] [place]  [place]  [phone]   [place] [address] [place] or [phone]    Mobile Crisis Teams Organization         Address  Phone  Notes  [facility], Mobile Crisis Care Unit  [phone]   [facility]  [address] [place], [place]   [place] Levels [address] [place] [phone]    Self-Help/Support Groups Organization         Address  Phone             Notes  [place]. of [place] - variety of support groups  [place] Call for more  information  Narcotics Anonymous (NA), [facility] [address], [employer] [place]  2 meetings at this location   Special educational needs teacher         Address  Phone  Notes  [facility] Residential Treatment [place],    [place]  [phone]   [facility]  [address] [place] [address], [place], [place]   [place], [place] [phone] Admissions: 8am-3pm M-F  Incentives Substance [place] [address],    [place], [place] [phone]   [facility] [address] [place], [place], [place]   [facility] [address]  [place], [place]   [facility] - Intensive Outpatient [address], [place], [place]   [facility] ([place].,  [place], [place] [phone] or [phone]   [facility] Services (RTS) [address], [place], [place] Accepts Medicaid  [facility] [place] [address]  [place] [phone] Substance Abuse/Addiction Treatment   [facility] Organization         Address  Phone  Notes  [facility]  [phone]   Domenic Schwab, PhD [address] [place], [place]   [phone] or [phone]   [facility]by [place], [place] [phone]  [facility] [address], [place], [place] [phone] Insurance/Medicaid/sponsorship through [employer] and Families [address] [place], [place] [phone] [place], [place] [phone]    Dr. Adele Schilder  [phone]   [facility] [place] Dept. 1) [address] [place] 2) [place] 3)  [place] [address], [place] [phone]  [phone]   [place] [phone] or [phone] (After Hours)

## 2014-04-04 NOTE — ED Notes (Signed)
Patient with boil under the right arm.,  She has hx of same.  Patient reports sx for 2 days.  She has tried otc meds w/o relief.  Patient has taken advil for pain at 0700.

## 2014-04-07 ENCOUNTER — Emergency Department (HOSPITAL_COMMUNITY)
Admission: EM | Admit: 2014-04-07 | Discharge: 2014-04-07 | Disposition: A | Discharge status: Home / Self Care | Payer: No Typology Code available for payment source | Attending: Emergency Medicine | Admitting: Emergency Medicine

## 2014-04-07 ENCOUNTER — Encounter (HOSPITAL_COMMUNITY): Payer: Self-pay | Admitting: Emergency Medicine

## 2014-04-07 DIAGNOSIS — Z87448 Personal history of other diseases of urinary system: Secondary | ICD-10-CM | POA: Diagnosis not present

## 2014-04-07 DIAGNOSIS — Z872 Personal history of diseases of the skin and subcutaneous tissue: Secondary | ICD-10-CM | POA: Insufficient documentation

## 2014-04-07 DIAGNOSIS — Z79899 Other long term (current) drug therapy: Secondary | ICD-10-CM | POA: Insufficient documentation

## 2014-04-07 DIAGNOSIS — Z720 Tobacco use: Secondary | ICD-10-CM | POA: Diagnosis not present

## 2014-04-07 DIAGNOSIS — L02411 Cutaneous abscess of right axilla: Secondary | ICD-10-CM | POA: Diagnosis present

## 2014-04-07 DIAGNOSIS — R112 Nausea with vomiting, unspecified: Secondary | ICD-10-CM | POA: Insufficient documentation

## 2014-04-07 DIAGNOSIS — Z8669 Personal history of other diseases of the nervous system and sense organs: Secondary | ICD-10-CM | POA: Insufficient documentation

## 2014-04-07 DIAGNOSIS — Z792 Long term (current) use of antibiotics: Secondary | ICD-10-CM | POA: Insufficient documentation

## 2014-04-07 MED ORDER — CEPHALEXIN 500 MG PO CAPS: 500.0000 mg | ORAL_CAPSULE | Freq: Three times a day (TID) | ORAL | Status: DC

## 2014-04-07 MED ORDER — ONDANSETRON 4 MG PO TBDP
4.0000 mg | ORAL_TABLET | Freq: Once | ORAL | Status: AC
  Administered 2014-04-07: 4 mg via ORAL
  Filled 2014-04-07: qty 1

## 2014-04-07 MED ORDER — OXYCODONE-ACETAMINOPHEN 5-325 MG PO TABS
1.0000 | ORAL_TABLET | Freq: Once | ORAL | Status: AC
  Administered 2014-04-07: 1 via ORAL
  Filled 2014-04-07: qty 1

## 2014-04-07 MED ORDER — LIDOCAINE-EPINEPHRINE 2 %-1:100000 IJ SOLN
20.0000 mL | Freq: Once | INTRAMUSCULAR | Status: DC
  Filled 2014-04-07: qty 1

## 2014-04-07 MED ORDER — HYDROMORPHONE HCL 1 MG/ML IJ SOLN
1.0000 mg | Freq: Once | INTRAMUSCULAR | Status: AC
  Administered 2014-04-07: 1 mg via INTRAMUSCULAR
  Filled 2014-04-07: qty 1

## 2014-04-07 MED ORDER — OXYCODONE-ACETAMINOPHEN 5-325 MG PO TABS: 1.0000 | ORAL_TABLET | ORAL | Status: DC | PRN

## 2014-04-07 MED ORDER — ONDANSETRON HCL 4 MG PO TABS: 4.0000 mg | ORAL_TABLET | Freq: Four times a day (QID) | ORAL | Status: DC

## 2014-04-07 MED ORDER — SULFAMETHOXAZOLE-TRIMETHOPRIM 800-160 MG PO TABS: 1.0000 | ORAL_TABLET | Freq: Two times a day (BID) | ORAL | Status: DC

## 2014-04-07 NOTE — ED Provider Notes (Signed)
CSN: [id number]     Arrival date & time 04/07/14  0457 History   First MD Initiated Contact with Patient 04/07/14 [phone]     Chief Complaint  Patient presents with  . Abscess     (Consider location/radiation/quality/duration/timing/severity/associated sxs/prior Treatment) HPI [name] is a 34 y.o. female with hx of recurrent abscesses, presents to ED with complaint of right axilla abscess. Pt states it started about a week ago. Reports being seen in ED 3 days ago. Had abscess incised and drained at that time. She was placed on Vicodin and doxycycline. States she has been taking doxycycline but states every time she takes it she has been vomiting. She has only had 2 doses that she was able to keep down. She states that the abscess is worsening since being seen 3 days ago. States it is swollen, tender, red. She has difficulty moving her right arm. She denies any fever or chills. No generalized malaise. States pain medications also making her vomit, so it is not helping. Nothing making symptoms better. No other complaints.   Past Medical History  Diagnosis Date  . Ovarian cyst   . Viral labyrinthitis   . DENTAL PAIN   . IRREGULAR MENSTRUATION   . FEMALE INFERTILITY   . BOILS, RECURRENT   . SEPTATE UTERUS   . Endometriosis   . Breast tenderness in female    Past Surgical History  Procedure Laterality Date  . Ovarian cyst removal  Mar 25 2010  . Biopsy of right breast    . Breast biopsy  1999    right  . Ovarian cyst removal  03/25/2010    right   Family History  Problem Relation Age of Onset  . Hypertension Mother   . Diabetes Mother   . Cancer Father   . Hypertension Father   . Diabetes Brother   . Hypertension Brother    History  Substance Use Topics  . Smoking status: Current Every Day Smoker -- 0.50 packs/day for 10 years    Types: Cigarettes  . Smokeless tobacco: Never Used  . Alcohol Use: Yes     Comment: occas.   OB History    Gravida Para Term Preterm AB TAB  SAB Ectopic Multiple Living   0         0     Review of Systems  Constitutional: Negative for fever and chills.  Gastrointestinal: Positive for nausea and vomiting.  Skin: Positive for wound. Negative for color change.  All other systems reviewed and are negative.     Allergies  Flagyl  Home Medications   Prior to Admission medications   Medication Sig Start Date End Date Taking? Authorizing Provider  doxycycline (VIBRAMYCIN) 100 MG capsule Take 1 capsule (100 mg total) by mouth 2 (two) times daily. One po bid x 7 days 04/04/14  Yes Clayton Bibles, PA-C  HYDROcodone-acetaminophen (NORCO/VICODIN) 5-325 MG per tablet Take 1-2 tablets by mouth every 4 (four) hours as needed for moderate pain or severe pain. 04/04/14  Yes Clayton Bibles, PA-C  amoxicillin (AMOXIL) 500 MG capsule Take 1 capsule (500 mg total) by mouth 3 (three) times daily. Patient not taking: Reported on 03/08/2014 01/29/14   Billy Fischer, MD  clindamycin (CLEOCIN) 2 % vaginal cream Place 1 Applicatorful vaginally at bedtime. Patient not taking: Reported on 03/08/2014 03/01/14   Gregor Hams, MD  clindamycin (CLEOCIN) 300 MG capsule Take 1 capsule (300 mg total) by mouth 2 times daily at 12 noon and 4  pm. Patient not taking: Reported on 03/08/2014 03/07/14   Gregor Hams, MD  fluconazole (DIFLUCAN) 150 MG tablet Take 1 tablet (150 mg total) by mouth once. Patient not taking: Reported on 03/08/2014 02/26/14   Gregor Hams, MD  Probiotic CAPS Take 1 capsule by mouth daily.    Historical Provider, MD   BP 106/70 mmHg  Pulse 96  Temp(Src) 98.1 F (36.7 C) (Oral)  Resp 18  SpO2 100%  [name] 03/27/2014 Physical Exam  Constitutional: She appears well-developed and well-nourished. No distress.  Eyes: Conjunctivae are normal.  Neck: Neck supple.  Neurological: She is alert.  Skin: Skin is warm and dry.  Scarring of the skin to the right axilla. There is a large area of induration, fluctuance, tenderness to the right axilla with  surrounding cellulitis extending in approximately 5cm radius down right chest. Tender to palpation. No drainage.  Nursing note and vitals reviewed.   ED Course  Procedures (including critical care time) Labs Review Labs Reviewed - No data to display  Imaging Review No results found.   EKG Interpretation None      INCISION AND DRAINAGE Performed by: Jeannett Senior A Consent: Verbal consent obtained. Risks and benefits: risks, benefits and alternatives were discussed Type: abscess  Body area: right axilla  Anesthesia: local infiltration  Incision was made with a scalpel.  Local anesthetic: lidocaine 2% w epinephrine  Anesthetic total: 5 ml  Complexity: complex Blunt dissection to break up loculations  Drainage: purulent  Drainage amount: copious  Packing material: 1/4 in iodoform gauze  Patient tolerance: Patient tolerated the procedure well with no immediate complications.     MDM   Final diagnoses:  Abscess of right axilla   Pt with large right axillary abscess. Incised and drained with copious purulent drainage. Packed. Will bring back in 2 days for recheck. VS normal. No evidence of systemic infection. Will switch to keflex, bactrim, give zofran for nausea, percocet for pain. Return precautions discussed.   Filed Vitals:   04/07/14 0516  BP: 106/70  Pulse: 96  Temp: 98.1 F (36.7 C)  TempSrc: Oral  Resp: 18  SpO2: 100%     Renold Genta, PA-C 04/07/14 [place], MD 04/07/14 [phone]

## 2014-04-07 NOTE — ED Notes (Signed)
Pt c/o abscess to R axillary area, pt states she did have I & D performed 2/24 at [facility] but pain continues. Pt states she continues to vomit antibiotics.

## 2014-04-07 NOTE — Discharge Instructions (Signed)
Bactrim and keflex until all gone for infection. zofran for nausea. Percocet for pain. You can also take ibuprofen. Warm compresses to the area. Return in two days for recheck.    Abscess Care After An abscess (also called a boil or furuncle) is an infected area that contains a collection of pus. Signs and symptoms of an abscess include pain, tenderness, redness, or hardness, or you may feel a moveable soft area under your skin. An abscess can occur anywhere in the body. The infection may spread to surrounding tissues causing cellulitis. A cut (incision) by the surgeon was made over your abscess and the pus was drained out. Gauze may have been packed into the space to provide a drain that will allow the cavity to heal from the inside outwards. The boil may be painful for 5 to 7 days. Most people with a boil do not have high fevers. Your abscess, if seen early, may not have localized, and may not have been lanced. If not, another appointment may be required for this if it does not get better on its own or with medications. HOME CARE INSTRUCTIONS   Only take over-the-counter or prescription medicines for pain, discomfort, or fever as directed by your caregiver.  When you bathe, soak and then remove gauze or iodoform packs at least daily or as directed by your caregiver. You may then wash the wound gently with mild soapy water. Repack with gauze or do as your caregiver directs. SEEK IMMEDIATE MEDICAL CARE IF:   You develop increased pain, swelling, redness, drainage, or bleeding in the wound site.  You develop signs of generalized infection including muscle aches, chills, fever, or a general ill feeling.  An oral temperature above 102 F (38.9 C) develops, not controlled by medication. See your caregiver for a recheck if you develop any of the symptoms described above. If medications (antibiotics) were prescribed, take them as directed. Document Released: 08/14/2004 Document Revised: 04/20/2011  Document Reviewed: 04/11/2007 [facility] Patient Information 2015 [place], [place]. This information is not intended to replace advice given to you by your health care provider. Make sure you discuss any questions you have with your health care provider.

## 2014-04-09 ENCOUNTER — Encounter (HOSPITAL_COMMUNITY): Payer: Self-pay

## 2014-04-09 ENCOUNTER — Emergency Department (HOSPITAL_COMMUNITY)
Admission: EM | Admit: 2014-04-09 | Discharge: 2014-04-09 | Disposition: A | Discharge status: Home / Self Care | Payer: No Typology Code available for payment source | Attending: Emergency Medicine | Admitting: Emergency Medicine

## 2014-04-09 DIAGNOSIS — Z792 Long term (current) use of antibiotics: Secondary | ICD-10-CM | POA: Diagnosis not present

## 2014-04-09 DIAGNOSIS — Z8669 Personal history of other diseases of the nervous system and sense organs: Secondary | ICD-10-CM | POA: Diagnosis not present

## 2014-04-09 DIAGNOSIS — Z720 Tobacco use: Secondary | ICD-10-CM | POA: Diagnosis not present

## 2014-04-09 DIAGNOSIS — L02411 Cutaneous abscess of right axilla: Secondary | ICD-10-CM | POA: Insufficient documentation

## 2014-04-09 DIAGNOSIS — Z4801 Encounter for change or removal of surgical wound dressing: Secondary | ICD-10-CM | POA: Diagnosis present

## 2014-04-09 DIAGNOSIS — Q512 Other doubling of uterus: Secondary | ICD-10-CM | POA: Insufficient documentation

## 2014-04-09 DIAGNOSIS — Z8742 Personal history of other diseases of the female genital tract: Secondary | ICD-10-CM | POA: Diagnosis not present

## 2014-04-09 MED ORDER — CLINDAMYCIN HCL 150 MG PO CAPS: 450.0000 mg | ORAL_CAPSULE | Freq: Three times a day (TID) | ORAL | Status: DC

## 2014-04-09 NOTE — Discharge Instructions (Signed)
Abscess An abscess is an infected area that contains a collection of pus and debris.It can occur in almost any part of the body. An abscess is also known as a furuncle or boil. CAUSES  An abscess occurs when tissue gets infected. This can occur from blockage of oil or sweat glands, infection of hair follicles, or a minor injury to the skin. As the body tries to fight the infection, pus collects in the area and creates pressure under the skin. This pressure causes pain. People with weakened immune systems have difficulty fighting infections and get certain abscesses more often.  SYMPTOMS Usually an abscess develops on the skin and becomes a painful mass that is red, warm, and tender. If the abscess forms under the skin, you may feel a moveable soft area under the skin. Some abscesses break open (rupture) on their own, but most will continue to get worse without care. The infection can spread deeper into the body and eventually into the bloodstream, causing you to feel ill.  DIAGNOSIS  Your caregiver will take your medical history and perform a physical exam. A sample of fluid may also be taken from the abscess to determine what is causing your infection. TREATMENT  Your caregiver may prescribe antibiotic medicines to fight the infection. However, taking antibiotics alone usually does not cure an abscess. Your caregiver may need to make a small cut (incision) in the abscess to drain the pus. In some cases, gauze is packed into the abscess to reduce pain and to continue draining the area. HOME CARE INSTRUCTIONS   Only take over-the-counter or prescription medicines for pain, discomfort, or fever as directed by your caregiver.  If you were prescribed antibiotics, take them as directed. Finish them even if you start to feel better.  If gauze is used, follow your caregiver's directions for changing the gauze.  To avoid spreading the infection:  Keep your draining abscess covered with a  bandage.  Wash your hands well.  Do not share personal care items, towels, or whirlpools with others.  Avoid skin contact with others.  Keep your skin and clothes clean around the abscess.  Keep all follow-up appointments as directed by your caregiver. SEEK MEDICAL CARE IF:   You have increased pain, swelling, redness, fluid drainage, or bleeding.  You have muscle aches, chills, or a general ill feeling.  You have a fever. MAKE SURE YOU:   Understand these instructions.  Will watch your condition.  Will get help right away if you are not doing well or get worse. Document Released: 11/05/2004 Document Revised: 07/28/2011 Document Reviewed: 04/10/2011 [facility] Patient Information 2015 [place], [place]. This information is not intended to replace advice given to you by your health care provider. Make sure you discuss any questions you have with your health care provider.  Hidradenitis Suppurativa, Sweat Gland Abscess Hidradenitis suppurativa is a long lasting (chronic), uncommon disease of the sweat glands. With this, boil-like lumps and scarring develop in the groin, some times under the arms (axillae), and under the breasts. It may also uncommonly occur behind the ears, in the crease of the buttocks, and around the genitals.  CAUSES  The cause is from a blocking of the sweat glands. They then become infected. It may cause drainage and odor. It is not contagious. So it cannot be given to someone else. It most often shows up in puberty (about 79 to 35 years of age). But it may happen much later. It is similar to acne which is a  disease of the sweat glands. This condition is slightly more common in African-Americans and women. SYMPTOMS   Hidradenitis usually starts as one or more red, tender, swellings in the groin or under the arms (axilla).  Over a period of hours to days the lesions get larger. They often open to the skin surface, draining clear to yellow-colored fluid.  The  infected area heals with scarring. DIAGNOSIS  Your caregiver makes this diagnosis by looking at you. Sometimes cultures (growing germs on plates in the lab) may be taken. This is to see what germ (bacterium) is causing the infection.  TREATMENT   Topical germ killing medicine applied to the skin (antibiotics) are the treatment of choice. Antibiotics taken by mouth (systemic) are sometimes needed when the condition is getting worse or is severe.  Avoid tight-fitting clothing which traps moisture in.  Dirt does not cause hidradenitis and it is not caused by poor hygiene.  Involved areas should be cleaned daily using an antibacterial soap. Some patients find that the liquid form of Lever 2000, applied to the involved areas as a lotion after bathing, can help reduce the odor related to this condition.  Sometimes surgery is needed to drain infected areas or remove scarred tissue. Removal of large amounts of tissue is used only in severe cases.  Birth control pills may be helpful.  Oral retinoids (vitamin A derivatives) for 6 to 12 months which are effective for acne may also help this condition.  Weight loss will improve but not cure hidradenitis. It is made worse by being overweight. But the condition is not caused by being overweight.  This condition is more common in people who have had acne.  It may become worse under stress. There is no medical cure for hidradenitis. It can be controlled, but not cured. The condition usually continues for years with periods of getting worse and getting better (remission). Document Released: 09/10/2003 Document Revised: 04/20/2011 Document Reviewed: 04/28/2013 [facility] Patient Information 2015 [name], [place]. This information is not intended to replace advice given to you by your health care provider. Make sure you discuss any questions you have with your health care provider.

## 2014-04-09 NOTE — ED Provider Notes (Signed)
CSN: [id number]     Arrival date & time 04/09/14  0315 History   First MD Initiated Contact with Patient 04/09/14 [phone]     Chief Complaint  Patient presents with  . Wound Check     (Consider location/radiation/quality/duration/timing/severity/associated sxs/prior Treatment) HPI Comments: Patient is here for wound check and packing removal from I&D of right axillary abscess done on 04/07/2014.  She continues to have nausea and vomiting after taking her antibiotics is only taking her antiemetics.  After taking the antibiotic.  She's had no fevers or chills.  She has had continued pain at the site with some purulent drainage.  The redness is improved.  Patient is a 34 y.o. female presenting with wound check.  Wound Check Pertinent negatives include no chest pain, no abdominal pain, no headaches and no shortness of breath.    Past Medical History  Diagnosis Date  . Ovarian cyst   . Viral labyrinthitis   . DENTAL PAIN   . IRREGULAR MENSTRUATION   . FEMALE INFERTILITY   . BOILS, RECURRENT   . SEPTATE UTERUS   . Endometriosis   . Breast tenderness in female    Past Surgical History  Procedure Laterality Date  . Ovarian cyst removal  Mar 25 2010  . Biopsy of right breast    . Breast biopsy  1999    right  . Ovarian cyst removal  03/25/2010    right   Family History  Problem Relation Age of Onset  . Hypertension Mother   . Diabetes Mother   . Cancer Father   . Hypertension Father   . Diabetes Brother   . Hypertension Brother    History  Substance Use Topics  . Smoking status: Current Every Day Smoker -- 0.50 packs/day for 10 years    Types: Cigarettes  . Smokeless tobacco: Never Used  . Alcohol Use: Yes     Comment: occas.   OB History    Gravida Para Term Preterm AB TAB SAB Ectopic Multiple Living   0         0     Review of Systems  Constitutional: Negative for fever, chills, diaphoresis, activity change, appetite change and fatigue.  HENT: Negative for  congestion, facial swelling, rhinorrhea and sore throat.   Eyes: Negative for photophobia and discharge.  Respiratory: Negative for cough, chest tightness and shortness of breath.   Cardiovascular: Negative for chest pain, palpitations and leg swelling.  Gastrointestinal: Negative for nausea, vomiting, abdominal pain and diarrhea.  Endocrine: Negative for polydipsia and polyuria.  Genitourinary: Negative for dysuria, frequency, difficulty urinating and pelvic pain.  Musculoskeletal: Negative for back pain, arthralgias, neck pain and neck stiffness.  Skin: Negative for color change and wound.  Allergic/Immunologic: Negative for immunocompromised state.  Neurological: Negative for facial asymmetry, weakness, numbness and headaches.  Hematological: Does not bruise/bleed easily.  Psychiatric/Behavioral: Negative for confusion and agitation.      Allergies  Flagyl  Home Medications   Prior to Admission medications   Medication Sig Start Date End Date Taking? Authorizing Provider  Ibuprofen 200 MG CAPS Take 400 mg by mouth every 4 (four) hours as needed (pain).   Yes Historical Provider, MD  ondansetron (ZOFRAN) 4 MG tablet Take 1 tablet (4 mg total) by mouth every 6 (six) hours. 04/07/14  Yes Tatyana A Kirichenko, PA-C  oxyCODONE-acetaminophen (PERCOCET) 5-325 MG per tablet Take 1 tablet by mouth every 4 (four) hours as needed for severe pain. 04/07/14  Yes Tatyana A Kirichenko, PA-C  amoxicillin (AMOXIL) 500 MG capsule Take 1 capsule (500 mg total) by mouth 3 (three) times daily. Patient not taking: Reported on 03/08/2014 01/29/14   Billy Fischer, MD  clindamycin (CLEOCIN) 150 MG capsule Take 3 capsules (450 mg total) by mouth 3 (three) times daily. 04/09/14   Ernestina Patches, MD  clindamycin (CLEOCIN) 2 % vaginal cream Place 1 Applicatorful vaginally at bedtime. Patient not taking: Reported on 03/08/2014 03/01/14   Gregor Hams, MD  fluconazole (DIFLUCAN) 150 MG tablet Take 1 tablet (150 mg  total) by mouth once. Patient not taking: Reported on 03/08/2014 02/26/14   Gregor Hams, MD  HYDROcodone-acetaminophen (NORCO/VICODIN) 5-325 MG per tablet Take 1-2 tablets by mouth every 4 (four) hours as needed for moderate pain or severe pain. Patient not taking: Reported on 04/09/2014 04/04/14   Clayton Bibles, PA-C   BP 114/78 mmHg  Pulse 78  Temp(Src) 98.1 F (36.7 C) (Oral)  Resp 16  SpO2 100%  [name] 03/27/2014 Physical Exam  Constitutional: She is oriented to person, place, and time. She appears well-developed and well-nourished. No distress.  HENT:  Head: Normocephalic and atraumatic.  Mouth/Throat: No oropharyngeal exudate.  Eyes: Pupils are equal, round, and reactive to light.  Neck: Normal range of motion. Neck supple.  Cardiovascular: Normal rate, regular rhythm and normal heart sounds.  Exam reveals no gallop and no friction rub.   No murmur heard. Pulmonary/Chest: Effort normal and breath sounds normal. No respiratory distress. She has no wheezes. She has no rales.  Abdominal: Soft. Bowel sounds are normal. She exhibits no distension and no mass. There is no tenderness. There is no rebound and no guarding.  Musculoskeletal: Normal range of motion. She exhibits no edema or tenderness.  I&D site to right axilla without surrounding erythema.  1.  Pressure applied small amount of purulent fluid was expelled.  Neurological: She is alert and oriented to person, place, and time.  Skin: Skin is warm and dry.  Psychiatric: She has a normal mood and affect.    ED Course  Procedures (including critical care time) Labs Review Labs Reviewed - No data to display  Imaging Review No results found.   EKG Interpretation None      MDM   Final diagnoses:  Abscess of right axilla    Pt is a 34 y.o. female with Pmhx as above who presents for wound check of I&D of abscess of right exam.  She denies fevers or chills, though is still having some pain in the area as well as purulent  drainage.  In comparison of prior notes, cellulitis appears improved, and I see no surrounding erythema to I&D site.  With gentle pressure.  She does have some purulent drainage.  Packing removed.  Despite antibiotics being changed.  She is still not tolerating them, though is only taking her antiemetics.  After taking the antibiotics.  I switched her from Bactrim and Keflex to clindamycin only with instructions to take Zofran, 20-30 minutes prior to antibiotics.  She is tolerated clindamycin well in the past.  I have encouraged her to follow up with general surgery assess suspect she has undiagnosed hidradenitis.       Lynford Humphrey evaluation in the Emergency Department is complete. It has been determined that no acute conditions requiring further emergency intervention are present at this time. The patient/guardian have been advised of the diagnosis and plan. We have discussed signs and symptoms that warrant return to the ED, such as changes or worsening in  symptoms, worsening pain, redness, swelling, fever.      Ernestina Patches, MD 04/09/14 [phone]

## 2014-04-09 NOTE — ED Notes (Signed)
Here for packing removal from under rt arm.

## 2014-04-09 NOTE — ED Notes (Signed)
Prior to assessment, packing removed by Tawnya Crook MD. Dressing clean, dry, intact. Pt reports less pain now post intervention.

## 2014-05-11 ENCOUNTER — Other Ambulatory Visit (HOSPITAL_COMMUNITY)
Admission: RE | Admit: 2014-05-11 | Discharge: 2014-05-11 | Disposition: A | Discharge status: Home / Self Care | Payer: No Typology Code available for payment source | Source: Ambulatory Visit | Attending: Emergency Medicine | Admitting: Emergency Medicine

## 2014-05-11 ENCOUNTER — Encounter (HOSPITAL_COMMUNITY): Payer: Self-pay | Admitting: Emergency Medicine

## 2014-05-11 ENCOUNTER — Emergency Department (INDEPENDENT_AMBULATORY_CARE_PROVIDER_SITE_OTHER)
Admission: EM | Admit: 2014-05-11 | Discharge: 2014-05-11 | Disposition: A | Discharge status: Home / Self Care | Payer: No Typology Code available for payment source | Source: Home / Self Care | Attending: Emergency Medicine | Admitting: Emergency Medicine

## 2014-05-11 DIAGNOSIS — Z113 Encounter for screening for infections with a predominantly sexual mode of transmission: Secondary | ICD-10-CM | POA: Diagnosis not present

## 2014-05-11 DIAGNOSIS — A499 Bacterial infection, unspecified: Secondary | ICD-10-CM

## 2014-05-11 DIAGNOSIS — N760 Acute vaginitis: Secondary | ICD-10-CM

## 2014-05-11 DIAGNOSIS — B9689 Other specified bacterial agents as the cause of diseases classified elsewhere: Secondary | ICD-10-CM

## 2014-05-11 LAB — POCT URINALYSIS DIP (DEVICE)
BILIRUBIN URINE: NEGATIVE
Glucose, UA: NEGATIVE mg/dL
Hgb urine dipstick: NEGATIVE
Ketones, ur: NEGATIVE mg/dL
LEUKOCYTES UA: NEGATIVE
Nitrite: NEGATIVE
PROTEIN: NEGATIVE mg/dL
Specific Gravity, Urine: >=1.03 (ref 1.005–1.030)
Urobilinogen, UA: 0.2 mg/dL (ref 0.0–1.0)
pH: 7 (ref 5.0–8.0)

## 2014-05-11 LAB — POCT PREGNANCY, URINE: Preg Test, Ur: NEGATIVE

## 2014-05-11 MED ORDER — CLINDAMYCIN HCL 150 MG PO CAPS: 450.0000 mg | ORAL_CAPSULE | Freq: Three times a day (TID) | ORAL | Status: DC

## 2014-05-11 NOTE — ED Notes (Signed)
Cal back number verified.

## 2014-05-11 NOTE — Discharge Instructions (Signed)
Take clindamycin 3 tablets 3 times a day for 7 days. We will call you if anything comes back positive on your lab work. Follow-up as needed.

## 2014-05-11 NOTE — ED Notes (Signed)
C/o white vag d/c w/foul odor onset 4 days Also reports mild abd cramping and urinary frequency States she is also concerned for STD exposure; unprotected sex Alert, no signs of acute distress.

## 2014-05-11 NOTE — ED Provider Notes (Signed)
CSN: [id number]     Arrival date & time 05/11/14  0240 History   First MD Initiated Contact with Patient 05/11/14 1142     Chief Complaint  Patient presents with  . Vaginal Discharge  . Exposure to STD   (Consider location/radiation/quality/duration/timing/severity/associated sxs/prior Treatment) HPI  She is a 34 year old woman here for evaluation of vaginal discharge. She reports about 4 days of a white foul-smelling discharge. No vaginal itching. She also reports some pelvic cramping. She would also like STD testing as she had unprotected sex several days ago. No fevers or chills. No nausea or vomiting.  Past Medical History  Diagnosis Date  . Ovarian cyst   . Viral labyrinthitis   . DENTAL PAIN   . IRREGULAR MENSTRUATION   . FEMALE INFERTILITY   . BOILS, RECURRENT   . SEPTATE UTERUS   . Endometriosis   . Breast tenderness in female    Past Surgical History  Procedure Laterality Date  . Ovarian cyst removal  Mar 25 2010  . Biopsy of right breast    . Breast biopsy  1999    right  . Ovarian cyst removal  03/25/2010    right   Family History  Problem Relation Age of Onset  . Hypertension Mother   . Diabetes Mother   . Cancer Father   . Hypertension Father   . Diabetes Brother   . Hypertension Brother    History  Substance Use Topics  . Smoking status: Current Every Day Smoker -- 0.50 packs/day for 10 years    Types: Cigarettes  . Smokeless tobacco: Never Used  . Alcohol Use: Yes     Comment: occas.   OB History    Gravida Para Term Preterm AB TAB SAB Ectopic Multiple Living   0         0     Review of Systems  Constitutional: Negative for fever and chills.  HENT: Positive for dental problem.   Gastrointestinal: Negative for nausea and vomiting.  Genitourinary: Positive for vaginal discharge. Negative for dysuria.    Allergies  Flagyl  Home Medications   Prior to Admission medications   Medication Sig Start Date End Date Taking? Authorizing Provider   clindamycin (CLEOCIN) 150 MG capsule Take 3 capsules (450 mg total) by mouth 3 (three) times daily. For 7 days 05/11/14   Melony Overly, MD  Ibuprofen 200 MG CAPS Take 400 mg by mouth every 4 (four) hours as needed (pain).    Historical Provider, MD  ondansetron (ZOFRAN) 4 MG tablet Take 1 tablet (4 mg total) by mouth every 6 (six) hours. 04/07/14   Tatyana Kirichenko, PA-C  oxyCODONE-acetaminophen (PERCOCET) 5-325 MG per tablet Take 1 tablet by mouth every 4 (four) hours as needed for severe pain. 04/07/14   Tatyana Kirichenko, PA-C   BP 111/70 mmHg  Pulse 85  Temp(Src) 98 F (36.7 C) (Oral)  Resp 16  SpO2 100%  [name] 04/25/2014 Physical Exam  Constitutional: She appears well-developed and well-nourished. No distress.  Neck: Neck supple.  Cardiovascular: Normal rate.   Pulmonary/Chest: Effort normal.  Abdominal: Soft.  Genitourinary: There is no rash on the right labia. There is no rash on the left labia. No bleeding in the vagina. Vaginal discharge (thick white) found.    ED Course  Procedures (including critical care time) Labs Review Labs Reviewed  HIV ANTIBODY (ROUTINE TESTING)  RPR  POCT URINALYSIS DIP (DEVICE)  POCT PREGNANCY, URINE  CERVICOVAGINAL ANCILLARY ONLY    Imaging Review  No results found.   MDM   1. BV (bacterial vaginosis)    We'll treat with clindamycin as she is allergic to Flagyl. STD testing collected. Follow-up as needed. Dental resources provided.    Melony Overly, MD 05/11/14 [phone]

## 2014-05-12 LAB — HIV ANTIBODY (ROUTINE TESTING W REFLEX): HIV Screen 4th Generation wRfx: NONREACTIVE

## 2014-05-12 LAB — RPR: RPR Ser Ql: NONREACTIVE

## 2014-05-14 ENCOUNTER — Telehealth (HOSPITAL_COMMUNITY): Payer: Self-pay | Admitting: *Deleted

## 2014-05-14 LAB — CERVICOVAGINAL ANCILLARY ONLY
Chlamydia: NEGATIVE
Neisseria Gonorrhea: NEGATIVE
Wet Prep (BD Affirm): POSITIVE — AB

## 2014-05-14 NOTE — ED Notes (Signed)
Patient called to inquire about test reports. Discussed negative HIV and RPR reports after verifying ID

## 2014-05-14 NOTE — ED Notes (Addendum)
Pt. called for her lab results.  Pt. verified x 2 and given results.  HIV/RPR non-reactive, GC/Chlamydia neg., Gardnerella pos. Pt. told she was adequately treated with Cleocin and to finish all of medication. Pt. had no questions. Roselyn Meier 05/14/2014

## 2014-06-12 ENCOUNTER — Emergency Department (HOSPITAL_COMMUNITY)
Admission: EM | Admit: 2014-06-12 | Discharge: 2014-06-12 | Disposition: A | Discharge status: Home / Self Care | Payer: No Typology Code available for payment source | Attending: Emergency Medicine | Admitting: Emergency Medicine

## 2014-06-12 ENCOUNTER — Encounter (HOSPITAL_COMMUNITY): Payer: Self-pay | Admitting: *Deleted

## 2014-06-12 DIAGNOSIS — Z791 Long term (current) use of non-steroidal anti-inflammatories (NSAID): Secondary | ICD-10-CM | POA: Insufficient documentation

## 2014-06-12 DIAGNOSIS — Z872 Personal history of diseases of the skin and subcutaneous tissue: Secondary | ICD-10-CM | POA: Insufficient documentation

## 2014-06-12 DIAGNOSIS — Z8669 Personal history of other diseases of the nervous system and sense organs: Secondary | ICD-10-CM | POA: Insufficient documentation

## 2014-06-12 DIAGNOSIS — K006 Disturbances in tooth eruption: Secondary | ICD-10-CM | POA: Insufficient documentation

## 2014-06-12 DIAGNOSIS — Z8619 Personal history of other infectious and parasitic diseases: Secondary | ICD-10-CM | POA: Insufficient documentation

## 2014-06-12 DIAGNOSIS — Q512 Other doubling of uterus: Secondary | ICD-10-CM | POA: Insufficient documentation

## 2014-06-12 DIAGNOSIS — K0381 Cracked tooth: Secondary | ICD-10-CM | POA: Insufficient documentation

## 2014-06-12 DIAGNOSIS — Z792 Long term (current) use of antibiotics: Secondary | ICD-10-CM | POA: Insufficient documentation

## 2014-06-12 DIAGNOSIS — Z8742 Personal history of other diseases of the female genital tract: Secondary | ICD-10-CM | POA: Insufficient documentation

## 2014-06-12 DIAGNOSIS — R6884 Jaw pain: Secondary | ICD-10-CM | POA: Insufficient documentation

## 2014-06-12 DIAGNOSIS — R51 Headache: Secondary | ICD-10-CM | POA: Insufficient documentation

## 2014-06-12 DIAGNOSIS — K047 Periapical abscess without sinus: Secondary | ICD-10-CM | POA: Insufficient documentation

## 2014-06-12 DIAGNOSIS — K029 Dental caries, unspecified: Secondary | ICD-10-CM | POA: Insufficient documentation

## 2014-06-12 DIAGNOSIS — Z720 Tobacco use: Secondary | ICD-10-CM | POA: Insufficient documentation

## 2014-06-12 HISTORY — DX: Other specified bacterial agents as the cause of diseases classified elsewhere: B96.89

## 2014-06-12 HISTORY — DX: Acute vaginitis: N76.0

## 2014-06-12 HISTORY — DX: Cutaneous abscess, unspecified: L02.91

## 2014-06-12 MED ORDER — LIDOCAINE VISCOUS 2 % MT SOLN
15.0000 mL | Freq: Once | OROMUCOSAL | Status: AC
  Administered 2014-06-12: 15 mL via OROMUCOSAL
  Filled 2014-06-12: qty 15

## 2014-06-12 MED ORDER — AMOXICILLIN 500 MG PO CAPS: 500.0000 mg | ORAL_CAPSULE | Freq: Three times a day (TID) | ORAL | Status: DC

## 2014-06-12 MED ORDER — NAPROXEN 500 MG PO TABS: 500.0000 mg | ORAL_TABLET | Freq: Two times a day (BID) | ORAL | Status: DC

## 2014-06-12 MED ORDER — LIDOCAINE VISCOUS 2 % MT SOLN: 20.0000 mL | OROMUCOSAL | Status: DC | PRN

## 2014-06-12 NOTE — ED Notes (Signed)
J Pipenbrink, PA, in w/pt.

## 2014-06-12 NOTE — ED Provider Notes (Signed)
CSN: [id number]     Arrival date & time 06/12/14  1038 History  This chart was scribed for Baron Sane, PA-C working with Varney Biles, MD by Randa Evens, ED Scribe. This patient was seen in room TR06C/TR06C and the patient's care was started at 10:49 AM.     Chief Complaint  Patient presents with  . Jaw Pain   Patient is a 34 y.o. female presenting with tooth pain. The history is provided by the patient. No language interpreter was used.  Dental Pain Location:  Upper Severity:  Mild Onset quality:  Gradual Duration:  2 weeks Timing:  Constant Progression:  Worsening Chronicity:  New Context: dental fracture   Relieved by:  Nothing Ineffective treatments:  NSAIDs Associated symptoms: headaches   Associated symptoms: no difficulty swallowing, no drooling, no facial swelling and no fever    HPI Comments: [name] is a 34 y.o. female who presents to the Emergency Department complaining of intermittent bilateral upper jaw pain that began 2 weeks ago. Pt states that the pain has recently worsened and become constant 2 days ago. Pt states that the pain radiates up to her head causing her to have HA. Pt does report chipped tooth on the upper left side. Pt states she has tried tramdol, naproxen and Advil with no relief. Denies trouble swallowing, facial swelling, SOB, fever, rhinorrhea or cough.    Past Medical History  Diagnosis Date  . Ovarian cyst   . Viral labyrinthitis   . DENTAL PAIN   . IRREGULAR MENSTRUATION   . FEMALE INFERTILITY   . BOILS, RECURRENT   . SEPTATE UTERUS   . Endometriosis   . Breast tenderness in female   . Gardnerella vaginalis infection   . Abscess   . BV (bacterial vaginosis)    Past Surgical History  Procedure Laterality Date  . Ovarian cyst removal  Mar 25 2010  . Biopsy of right breast    . Breast biopsy  1999    right  . Ovarian cyst removal  03/25/2010    right   Family History  Problem Relation Age of Onset  . Hypertension  Mother   . Diabetes Mother   . Cancer Father   . Hypertension Father   . Diabetes Brother   . Hypertension Brother    History  Substance Use Topics  . Smoking status: Current Every Day Smoker -- 0.50 packs/day for 10 years    Types: Cigarettes  . Smokeless tobacco: Never Used  . Alcohol Use: Yes     Comment: occas.   OB History    Gravida Para Term Preterm AB TAB SAB Ectopic Multiple Living   0         0     Review of Systems  Constitutional: Negative for fever.  HENT: Negative for drooling, facial swelling, rhinorrhea and trouble swallowing.        Bilateral jaw pain   Respiratory: Negative for cough.   Neurological: Positive for headaches.  All other systems reviewed and are negative.   Allergies  Flagyl  Home Medications   Prior to Admission medications   Medication Sig Start Date End Date Taking? Authorizing Provider  amoxicillin (AMOXIL) 500 MG capsule Take 1 capsule (500 mg total) by mouth 3 (three) times daily. 06/12/14   Kimi Kroft, PA-C  clindamycin (CLEOCIN) 150 MG capsule Take 3 capsules (450 mg total) by mouth 3 (three) times daily. For 7 days 05/11/14   Melony Overly, MD  Ibuprofen 200 MG  CAPS Take 400 mg by mouth every 4 (four) hours as needed (pain).    Historical Provider, MD  lidocaine (XYLOCAINE) 2 % solution Use as directed 20 mLs in the mouth or throat as needed for mouth pain. 06/12/14   Chanese Hartsough, PA-C  naproxen (NAPROSYN) 500 MG tablet Take 1 tablet (500 mg total) by mouth 2 (two) times daily with a meal. 06/12/14   Lorice Lafave, PA-C  ondansetron (ZOFRAN) 4 MG tablet Take 1 tablet (4 mg total) by mouth every 6 (six) hours. 04/07/14   Tatyana Kirichenko, PA-C  oxyCODONE-acetaminophen (PERCOCET) 5-325 MG per tablet Take 1 tablet by mouth every 4 (four) hours as needed for severe pain. 04/07/14   Tatyana Kirichenko, PA-C   BP 120/65 mmHg  Pulse 85  Temp(Src) 98.8 F (37.1 C) (Oral)  Resp 16  Ht 5\' 2"  (1.575 m)  Wt 186 lb 4.8 oz  (84.505 kg)  BMI 34.07 kg/m2  SpO2 100%  [name] 05/26/2014 (Exact Date)   Physical Exam  Constitutional: She is oriented to person, place, and time. She appears well-developed and well-nourished. No distress.  HENT:  Head: Normocephalic and atraumatic.  Right Ear: Hearing, tympanic membrane, external ear and ear canal normal.  Left Ear: Hearing, tympanic membrane, external ear and ear canal normal.  Nose: Nose normal.  Mouth/Throat: Uvula is midline, oropharynx is clear and moist and mucous membranes are normal. No oral lesions. No trismus in the jaw. Abnormal dentition. Dental caries present. No dental abscesses, uvula swelling or lacerations.    Submental and sublingual spaces are soft. Non tenderness along mandible.   Eyes: Conjunctivae are normal.  Neck: Normal range of motion. Neck supple.  Cardiovascular: Normal rate.   Pulmonary/Chest: Effort normal.  Neurological: She is alert and oriented to person, place, and time.  Skin: Skin is warm and dry. She is not diaphoretic.  Psychiatric: She has a normal mood and affect.  Nursing note and vitals reviewed.   ED Course  Procedures (including critical care time) Medications  lidocaine (XYLOCAINE) 2 % viscous mouth solution 15 mL (15 mLs Mouth/Throat Given 06/12/14 1106)    DIAGNOSTIC STUDIES: Oxygen Saturation is 100% on RA, normal by my interpretation.    COORDINATION OF CARE: 11:01 AM-Discussed treatment plan with pt at bedside and pt agreed to plan.     Labs Review Labs Reviewed - No data to display  Imaging Review No results found.   EKG Interpretation None      MDM   Final diagnoses:  Dental infection    Filed Vitals:   06/12/14 1045  BP: 120/65  Pulse: 85  Temp: 98.8 F (37.1 C)  Resp: 16   Afebrile, NAD, non-toxic appearing, AAOx4. Patient with jaw pain. No mandible tenderness or deformity.  No gross abscess.  Exam unconcerning for Ludwig's angina or spread of infection.  Will treat with Amoxil,  NSAIDs, xylocaine.  Urged patient to follow-up with dentist.  Return precautions discussed. Patient is agreeable to plan. Patient is stable at time of discharge      I personally performed the services described in this documentation, which was scribed in my presence. The recorded information has been reviewed and is accurate.       Baron Sane, PA-C 06/12/14 [place], MD 06/14/14 [phone]

## 2014-06-12 NOTE — Discharge Instructions (Signed)
Please follow up with your primary care physician in 1-2 days. If you do not have one please call the [facility] number listed above. Please follow up with Dr. Maurilio Lovely to schedule a follow up appointment.  Please take your antibiotic until completion. Please read all discharge instructions and return precautions.   Dental Pain A tooth ache may be caused by cavities (tooth decay). Cavities expose the nerve of the tooth to air and hot or cold temperatures. It may come from an infection or abscess (also called a boil or furuncle) around your tooth. It is also often caused by dental caries (tooth decay). This causes the pain you are having. DIAGNOSIS  Your caregiver can diagnose this problem by exam. TREATMENT   If caused by an infection, it may be treated with medications which kill germs (antibiotics) and pain medications as prescribed by your caregiver. Take medications as directed.  Only take over-the-counter or prescription medicines for pain, discomfort, or fever as directed by your caregiver.  Whether the tooth ache today is caused by infection or dental disease, you should see your dentist as soon as possible for further care. SEEK MEDICAL CARE IF: The exam and treatment you received today has been provided on an emergency basis only. This is not a substitute for complete medical or dental care. If your problem worsens or new problems (symptoms) appear, and you are unable to meet with your dentist, call or return to this location. SEEK IMMEDIATE MEDICAL CARE IF:   You have a fever.  You develop redness and swelling of your face, jaw, or neck.  You are unable to open your mouth.  You have severe pain uncontrolled by pain medicine. MAKE SURE YOU:   Understand these instructions.  Will watch your condition.  Will get help right away if you are not doing well or get worse. Document Released: 01/26/2005 Document Revised: 04/20/2011 Document Reviewed:  09/14/2007 [facility] Patient Information 2015 [place], [place]. This information is not intended to replace advice given to you by your health care provider. Make sure you discuss any questions you have with your health care provider.

## 2014-06-12 NOTE — ED Notes (Signed)
States took following meds during the night/this am: Advil, Tramadol, Naproxen - no relief

## 2014-06-12 NOTE — ED Notes (Signed)
Pt c/o bil jaw pain x 2 weeks - intermittently.

## 2014-07-05 ENCOUNTER — Other Ambulatory Visit (HOSPITAL_COMMUNITY)
Admission: RE | Admit: 2014-07-05 | Discharge: 2014-07-05 | Disposition: A | Discharge status: Home / Self Care | Payer: No Typology Code available for payment source | Source: Ambulatory Visit | Attending: Family Medicine | Admitting: Family Medicine

## 2014-07-05 ENCOUNTER — Emergency Department (INDEPENDENT_AMBULATORY_CARE_PROVIDER_SITE_OTHER)
Admission: EM | Admit: 2014-07-05 | Discharge: 2014-07-05 | Disposition: A | Discharge status: Home / Self Care | Payer: No Typology Code available for payment source | Source: Home / Self Care | Attending: Family Medicine | Admitting: Family Medicine

## 2014-07-05 DIAGNOSIS — Z113 Encounter for screening for infections with a predominantly sexual mode of transmission: Secondary | ICD-10-CM | POA: Insufficient documentation

## 2014-07-05 DIAGNOSIS — N760 Acute vaginitis: Secondary | ICD-10-CM | POA: Insufficient documentation

## 2014-07-05 LAB — POCT URINALYSIS DIP (DEVICE)
Bilirubin Urine: NEGATIVE
Glucose, UA: NEGATIVE mg/dL
HGB URINE DIPSTICK: NEGATIVE
Ketones, ur: NEGATIVE mg/dL
LEUKOCYTES UA: NEGATIVE
Nitrite: NEGATIVE
Protein, ur: NEGATIVE mg/dL
Specific Gravity, Urine: 1.02 (ref 1.005–1.030)
Urobilinogen, UA: 0.2 mg/dL (ref 0.0–1.0)
pH: 7.5 (ref 5.0–8.0)

## 2014-07-05 LAB — POCT PREGNANCY, URINE: Preg Test, Ur: NEGATIVE

## 2014-07-05 MED ORDER — METRONIDAZOLE 500 MG PO TABS: 500.0000 mg | ORAL_TABLET | Freq: Two times a day (BID) | ORAL | Status: DC

## 2014-07-05 NOTE — Discharge Instructions (Signed)
Thank you for coming in today. We will call if anything comes back positive that you are not being treated for.  Return as needed.    Bacterial Vaginosis Bacterial vaginosis is a vaginal infection that occurs when the normal balance of bacteria in the vagina is disrupted. It results from an overgrowth of certain bacteria. This is the most common vaginal infection in women of childbearing age. Treatment is important to prevent complications, especially in pregnant women, as it can cause a premature delivery. CAUSES  Bacterial vaginosis is caused by an increase in harmful bacteria that are normally present in smaller amounts in the vagina. Several different kinds of bacteria can cause bacterial vaginosis. However, the reason that the condition develops is not fully understood. RISK FACTORS Certain activities or behaviors can put you at an increased risk of developing bacterial vaginosis, including:  Having a new sex partner or multiple sex partners.  Douching.  Using an intrauterine device (IUD) for contraception. Women do not get bacterial vaginosis from toilet seats, bedding, swimming pools, or contact with objects around them. SIGNS AND SYMPTOMS  Some women with bacterial vaginosis have no signs or symptoms. Common symptoms include:  Grey vaginal discharge.  A fishlike odor with discharge, especially after sexual intercourse.  Itching or burning of the vagina and vulva.  Burning or pain with urination. DIAGNOSIS  Your health care provider will take a medical history and examine the vagina for signs of bacterial vaginosis. A sample of vaginal fluid may be taken. Your health care provider will look at this sample under a microscope to check for bacteria and abnormal cells. A vaginal pH test may also be done.  TREATMENT  Bacterial vaginosis may be treated with antibiotic medicines. These may be given in the form of a pill or a vaginal cream. A second round of antibiotics may be  prescribed if the condition comes back after treatment.  HOME CARE INSTRUCTIONS   Only take over-the-counter or prescription medicines as directed by your health care provider.  If antibiotic medicine was prescribed, take it as directed. Make sure you finish it even if you start to feel better.  Do not have sex until treatment is completed.  Tell all sexual partners that you have a vaginal infection. They should see their health care provider and be treated if they have problems, such as a mild rash or itching.  Practice safe sex by using condoms and only having one sex partner. SEEK MEDICAL CARE IF:   Your symptoms are not improving after 3 days of treatment.  You have increased discharge or pain.  You have a fever. MAKE SURE YOU:   Understand these instructions.  Will watch your condition.  Will get help right away if you are not doing well or get worse. FOR MORE INFORMATION  Centers for Disease Control and Prevention, Division of STD Prevention: AppraiserFraud.fi American Sexual Health Association (ASHA): www.ashastd.org  Document Released: 01/26/2005 Document Revised: 11/16/2012 Document Reviewed: 09/07/2012 [facility] Patient Information 2015 [place], [place]. This information is not intended to replace advice given to you by your health care provider. Make sure you discuss any questions you have with your health care provider.

## 2014-07-05 NOTE — ED Provider Notes (Signed)
[name] is a 34 y.o. female who presents to Urgent Care today for vaginal discharge. Patient developed vaginal discharge over the past 4 days. Her symptoms are consistent with previous episodes of bacterial vaginosis. No fevers or chills and vomiting or diarrhea. She's tried naproxen which has not helped. She states that she is allergic to metronidazole which causes vomiting. She states that she cannot afford vaginal clindamycin or metronidazole treatments.   Past Medical History  Diagnosis Date  . Ovarian cyst   . Viral labyrinthitis   . DENTAL PAIN   . IRREGULAR MENSTRUATION   . FEMALE INFERTILITY   . BOILS, RECURRENT   . SEPTATE UTERUS   . Endometriosis   . Breast tenderness in female   . Gardnerella vaginalis infection   . Abscess   . BV (bacterial vaginosis)    Past Surgical History  Procedure Laterality Date  . Ovarian cyst removal  Mar 25 2010  . Biopsy of right breast    . Breast biopsy  1999    right  . Ovarian cyst removal  03/25/2010    right   History  Substance Use Topics  . Smoking status: Current Every Day Smoker -- 0.50 packs/day for 10 years    Types: Cigarettes  . Smokeless tobacco: Never Used  . Alcohol Use: Yes     Comment: occas.   ROS as above Medications: No current facility-administered medications for this encounter.   Current Outpatient Prescriptions  Medication Sig Dispense Refill  . amoxicillin (AMOXIL) 500 MG capsule Take 1 capsule (500 mg total) by mouth 3 (three) times daily. 21 capsule 0  . Ibuprofen 200 MG CAPS Take 400 mg by mouth every 4 (four) hours as needed (pain).    Marland Kitchen lidocaine (XYLOCAINE) 2 % solution Use as directed 20 mLs in the mouth or throat as needed for mouth pain. 100 mL 0  . metroNIDAZOLE (FLAGYL) 500 MG tablet Take 1 tablet (500 mg total) by mouth 2 (two) times daily. 14 tablet 0  . naproxen (NAPROSYN) 500 MG tablet Take 1 tablet (500 mg total) by mouth 2 (two) times daily with a meal. 30 tablet 0  . ondansetron  (ZOFRAN) 4 MG tablet Take 1 tablet (4 mg total) by mouth every 6 (six) hours. 12 tablet 0  . [DISCONTINUED] Norgestimate-Ethinyl Estradiol Triphasic (TRI-SPRINTEC) 0.18/0.215/0.25 MG-35 MCG tablet Take 1 tablet by mouth daily. 1 Package 11   Allergies  Allergen Reactions  . Flagyl [Metronidazole] Nausea And Vomiting     Exam:  BP 116/78 mmHg  Pulse 82  Temp(Src) 98.3 F (36.8 C) (Oral)  Resp 17  SpO2 99%  [name] 06/25/2014 Gen: Well NAD HEENT: EOMI,  MMM Lungs: Normal work of breathing. CTABL Heart: RRR no MRG Abd: NABS, Soft. Nondistended, Nontender no rebound or guarding Exts: Brisk capillary refill, warm and well perfused.  GYN: Normal external genitalia. Vaginal canal with clumpy white discharge. Normal-appearing cervix. Nontender.  Results for orders placed or performed during the hospital encounter of 07/05/14 (from the past 24 hour(s))  POCT urinalysis dip (device)     Status: None   Collection Time: 07/05/14  1:38 PM  Result Value Ref Range   Glucose, UA NEGATIVE NEGATIVE mg/dL   Bilirubin Urine NEGATIVE NEGATIVE   Ketones, ur NEGATIVE NEGATIVE mg/dL   Specific Gravity, Urine 1.020 1.005 - 1.030   Hgb urine dipstick NEGATIVE NEGATIVE   pH 7.5 5.0 - 8.0   Protein, ur NEGATIVE NEGATIVE mg/dL   Urobilinogen, UA 0.2 0.0 -  1.0 mg/dL   Nitrite NEGATIVE NEGATIVE   Leukocytes, UA NEGATIVE NEGATIVE   No results found.  Assessment and Plan: 34 y.o. female with vaginitis. Patient is quite adamant that this is BV and yeast. She requests empiric treatment. Discussed options. She will take oral metronidazole. She understands that it may cause some vomiting to her which is a common side effect. I am doubtful for true allergy. Will call back with results if positive and not treated.  Discussed warning signs or symptoms. Please see discharge instructions. Patient expresses understanding.     Gregor Hams, MD 07/05/14 1341

## 2014-07-05 NOTE — ED Notes (Signed)
Reports vaginal discharge with odor.  Lower abdominal/pelvic discomfort.  Mild diarrhea.  Chills.  No relief with otc pain meds.   No other treatments tried.

## 2014-07-06 LAB — CERVICOVAGINAL ANCILLARY ONLY
Chlamydia: NEGATIVE
Neisseria Gonorrhea: NEGATIVE
WET PREP (BD AFFIRM): POSITIVE — AB

## 2014-07-06 LAB — RPR: RPR Ser Ql: NONREACTIVE

## 2014-07-06 LAB — HIV ANTIBODY (ROUTINE TESTING W REFLEX): HIV SCREEN 4TH GENERATION: NONREACTIVE

## 2014-07-13 NOTE — ED Notes (Signed)
Final report of labs available for review. Positive for gardnerella , treated w flagyl, treatment adequate. Negative reports on GC, chlamydia, yeast

## 2014-08-08 ENCOUNTER — Telehealth (HOSPITAL_COMMUNITY): Payer: Self-pay | Admitting: Family Medicine

## 2014-08-08 MED ORDER — CLINDAMYCIN PHOSPHATE 2 % VA CREA: 1.0000 | TOPICAL_CREAM | Freq: Every day | VAGINAL | Status: DC

## 2014-08-08 NOTE — ED Notes (Signed)
Patient just picked up the metronidazole from the visit in late May. She is having trouble keeping the metronidazole down. Prescribed vaginal clindamycin. Nurse will call patient back.  Gregor Hams, MD 08/08/14 [phone]

## 2014-08-09 ENCOUNTER — Telehealth (HOSPITAL_COMMUNITY): Payer: Self-pay | Admitting: Family Medicine

## 2014-08-09 MED ORDER — CLINDAMYCIN HCL 300 MG PO CAPS: 300.0000 mg | ORAL_CAPSULE | Freq: Three times a day (TID) | ORAL | Status: DC

## 2014-08-09 NOTE — ED Notes (Signed)
PT cannot afford clindamycin cream. Will try oral clindamycin   Kerri Hams, MD 08/09/14 1535

## 2014-08-29 ENCOUNTER — Emergency Department (INDEPENDENT_AMBULATORY_CARE_PROVIDER_SITE_OTHER)
Admission: EM | Admit: 2014-08-29 | Discharge: 2014-08-29 | Disposition: A | Discharge status: Home / Self Care | Payer: No Typology Code available for payment source | Source: Home / Self Care | Attending: Emergency Medicine | Admitting: Emergency Medicine

## 2014-08-29 ENCOUNTER — Encounter (HOSPITAL_COMMUNITY): Payer: Self-pay | Admitting: Emergency Medicine

## 2014-08-29 ENCOUNTER — Other Ambulatory Visit (HOSPITAL_COMMUNITY)
Admission: RE | Admit: 2014-08-29 | Discharge: 2014-08-29 | Disposition: A | Discharge status: Home / Self Care | Payer: No Typology Code available for payment source | Source: Ambulatory Visit | Attending: Emergency Medicine | Admitting: Emergency Medicine

## 2014-08-29 DIAGNOSIS — N898 Other specified noninflammatory disorders of vagina: Secondary | ICD-10-CM | POA: Diagnosis not present

## 2014-08-29 DIAGNOSIS — N760 Acute vaginitis: Secondary | ICD-10-CM | POA: Diagnosis present

## 2014-08-29 DIAGNOSIS — J029 Acute pharyngitis, unspecified: Secondary | ICD-10-CM

## 2014-08-29 DIAGNOSIS — Z113 Encounter for screening for infections with a predominantly sexual mode of transmission: Secondary | ICD-10-CM | POA: Insufficient documentation

## 2014-08-29 LAB — POCT PREGNANCY, URINE: Preg Test, Ur: NEGATIVE

## 2014-08-29 LAB — POCT URINALYSIS DIP (DEVICE)
Bilirubin Urine: NEGATIVE
Glucose, UA: NEGATIVE mg/dL
Hgb urine dipstick: NEGATIVE
Ketones, ur: NEGATIVE mg/dL
Leukocytes, UA: NEGATIVE
Nitrite: NEGATIVE
PROTEIN: NEGATIVE mg/dL
Specific Gravity, Urine: 1.025 (ref 1.005–1.030)
Urobilinogen, UA: 0.2 mg/dL (ref 0.0–1.0)
pH: 7 (ref 5.0–8.0)

## 2014-08-29 MED ORDER — CEFTRIAXONE SODIUM 250 MG IJ SOLR
250.0000 mg | Freq: Once | INTRAMUSCULAR | Status: AC
  Administered 2014-08-29: 250 mg via INTRAMUSCULAR

## 2014-08-29 MED ORDER — AZITHROMYCIN 250 MG PO TABS
1000.0000 mg | ORAL_TABLET | Freq: Once | ORAL | Status: AC
  Administered 2014-08-29: 1000 mg via ORAL

## 2014-08-29 MED ORDER — CEFTRIAXONE SODIUM 250 MG IJ SOLR
INTRAMUSCULAR | Status: AC
  Filled 2014-08-29: qty 250

## 2014-08-29 MED ORDER — LIDOCAINE HCL (PF) 1 % IJ SOLN
INTRAMUSCULAR | Status: AC
  Filled 2014-08-29: qty 5

## 2014-08-29 MED ORDER — FLUCONAZOLE 150 MG PO TABS: 150.0000 mg | ORAL_TABLET | Freq: Once | ORAL | Status: DC

## 2014-08-29 MED ORDER — AZITHROMYCIN 250 MG PO TABS
ORAL_TABLET | ORAL | Status: AC
  Filled 2014-08-29: qty 4

## 2014-08-29 NOTE — ED Provider Notes (Signed)
CSN: [id number]     Arrival date & time 08/29/14  1300 History   First MD Initiated Contact with Patient 08/29/14 1308     Chief Complaint  Patient presents with  . Abdominal Pain  . Sore Throat   (Consider location/radiation/quality/duration/timing/severity/associated sxs/prior Treatment) HPI  Is a 34 year old woman here for evaluation of sore throat and lower abdominal pain. She states she had unprotected vaginal and oral sex on Thursday. On Saturday, she developed a sore throat as well as some lower abdominal pain. Abdominal pain is present mostly when urinating. She feels like she has to force the urine. She also reports vaginal discharge with some vaginal burning and itching. No fevers or chills. No nausea or vomiting. No nasal congestion, rhinorrhea, cough, shortness of breath.  Past Medical History  Diagnosis Date  . Ovarian cyst   . Viral labyrinthitis   . DENTAL PAIN   . IRREGULAR MENSTRUATION   . FEMALE INFERTILITY   . BOILS, RECURRENT   . SEPTATE UTERUS   . Endometriosis   . Breast tenderness in female   . Gardnerella vaginalis infection   . Abscess   . BV (bacterial vaginosis)    Past Surgical History  Procedure Laterality Date  . Ovarian cyst removal  Mar 25 2010  . Biopsy of right breast    . Breast biopsy  1999    right  . Ovarian cyst removal  03/25/2010    right   Family History  Problem Relation Age of Onset  . Hypertension Mother   . Diabetes Mother   . Cancer Father   . Hypertension Father   . Diabetes Brother   . Hypertension Brother    History  Substance Use Topics  . Smoking status: Current Every Day Smoker -- 0.50 packs/day for 10 years    Types: Cigarettes  . Smokeless tobacco: Never Used  . Alcohol Use: Yes     Comment: occas.   OB History    Gravida Para Term Preterm AB TAB SAB Ectopic Multiple Living   0         0     Review of Systems As in history of present illness Allergies  Flagyl  Home Medications   Prior to Admission  medications   Medication Sig Start Date End Date Taking? Authorizing Provider  amoxicillin (AMOXIL) 500 MG capsule Take 1 capsule (500 mg total) by mouth 3 (three) times daily. 06/12/14   Jennifer Piepenbrink, PA-C  clindamycin (CLEOCIN) 2 % vaginal cream Place 1 Applicatorful vaginally at bedtime. 08/08/14   Gregor Hams, MD  clindamycin (CLEOCIN) 300 MG capsule Take 1 capsule (300 mg total) by mouth 3 (three) times daily. 08/09/14   Gregor Hams, MD  fluconazole (DIFLUCAN) 150 MG tablet Take 1 tablet (150 mg total) by mouth once. Repeat in 3 days if needed. 08/29/14   Melony Overly, MD  Ibuprofen 200 MG CAPS Take 400 mg by mouth every 4 (four) hours as needed (pain).    Historical Provider, MD  lidocaine (XYLOCAINE) 2 % solution Use as directed 20 mLs in the mouth or throat as needed for mouth pain. 06/12/14   Jennifer Piepenbrink, PA-C  metroNIDAZOLE (FLAGYL) 500 MG tablet Take 1 tablet (500 mg total) by mouth 2 (two) times daily. 07/05/14   Gregor Hams, MD  naproxen (NAPROSYN) 500 MG tablet Take 1 tablet (500 mg total) by mouth 2 (two) times daily with a meal. 06/12/14   Baron Sane, PA-C  ondansetron [facility]) 4  MG tablet Take 1 tablet (4 mg total) by mouth every 6 (six) hours. 04/07/14   Tatyana Kirichenko, PA-C   BP 112/73 mmHg  Pulse 92  Temp(Src) 98.7 F (37.1 C) (Oral)  Resp 18  SpO2 100%  [name] 08/12/2014 Physical Exam  Constitutional: She is oriented to person, place, and time.  HENT:  Mouth/Throat: No oropharyngeal exudate.  Oropharynx with mild erythema  Eyes: Conjunctivae are normal.  Neck: Neck supple.  Cardiovascular: Normal rate.   Pulmonary/Chest: Effort normal.  Abdominal: Soft.  No CVA tenderness  Genitourinary: There is no rash on the right labia. There is no rash on the left labia. Cervix exhibits no motion tenderness, no discharge and no friability. No bleeding in the vagina. No foreign body around the vagina. Vaginal discharge (clumpy white) found.  Lymphadenopathy:     She has no cervical adenopathy.  Neurological: She is alert and oriented to person, place, and time.    ED Course  Procedures (including critical care time) Labs Review Labs Reviewed  URINE CULTURE  POCT URINALYSIS DIP (DEVICE)  POCT PREGNANCY, URINE  CERVICOVAGINAL ANCILLARY ONLY  CYTOLOGY, (ORAL, ANAL, URETHRAL) ANCILLARY ONLY    Imaging Review No results found.   MDM   1. Sore throat   2. Vaginal discharge    I'm concerned for gonococcal or chlamydial pharyngitis/vaginitis given her history. Rapid strep is negative. Oral cytology sent for gonorrhea and chlamydia. Vaginal swab sent as well. UA is normal. I have sent the urine for culture given her urinary symptoms. Treat presumptively with Rocephin 250 mg IM and azithromycin 1 g by mouth. Prescription for Diflucan given. Follow-up as needed.    Melony Overly, MD 08/29/14 1343

## 2014-08-29 NOTE — ED Notes (Signed)
C/o abd pain and sore throat  States she used unprotective sex States she has an odor and discharge  Has pain when urinating

## 2014-08-29 NOTE — Discharge Instructions (Signed)
We treated you for gonorrhea and Chlamydia today. We will call you if your testing comes back positive. Take Diflucan once later today. Repeat this dose in 3 days if you are still having discharge or itching. Follow-up as needed.

## 2014-08-30 LAB — URINE CULTURE

## 2014-08-30 LAB — CERVICOVAGINAL ANCILLARY ONLY
Chlamydia: NEGATIVE
Neisseria Gonorrhea: NEGATIVE
WET PREP (BD AFFIRM): POSITIVE — AB

## 2014-08-30 LAB — RPR: RPR: NONREACTIVE

## 2014-08-30 LAB — CYTOLOGY, (ORAL, ANAL, URETHRAL) ANCILLARY ONLY
CHLAMYDIA, DNA PROBE: NEGATIVE
Neisseria Gonorrhea: NEGATIVE

## 2014-08-30 LAB — HIV ANTIBODY (ROUTINE TESTING W REFLEX): HIV Screen 4th Generation wRfx: NONREACTIVE

## 2014-08-31 NOTE — ED Notes (Signed)
Patient called to inquire about her lab reports. Positive for BV, no tx. Called in Rx to [facility] , [address] at patient request for Dr Maryruth Eve, MD Flagyl 500 mg, PO, BID x 7 days. Spoke directly w pharmacist

## 2014-08-31 NOTE — ED Notes (Signed)
Final report of STD testing negative, HIV and RPR reports negative

## 2014-09-03 LAB — CULTURE, GROUP A STREP

## 2014-09-03 NOTE — ED Notes (Signed)
Spoke w patient, and she c/o continues to have ST symptoms will call in Rx for amoxicillin 500 mg PO, BID, to her regular pharmacy. When on phone to pharmacist, they made remark the Rx for Flagyl has not been picked up for use. Was advised she is to take both Rx as written, and added Diflucan 150 1 if starts to have symptoms, and again after a week

## 2014-10-14 ENCOUNTER — Emergency Department (INDEPENDENT_AMBULATORY_CARE_PROVIDER_SITE_OTHER)
Admission: EM | Admit: 2014-10-14 | Discharge: 2014-10-14 | Disposition: A | Discharge status: Home / Self Care | Payer: No Typology Code available for payment source | Source: Home / Self Care | Attending: Family Medicine | Admitting: Family Medicine

## 2014-10-14 ENCOUNTER — Encounter (HOSPITAL_COMMUNITY): Payer: Self-pay | Admitting: *Deleted

## 2014-10-14 ENCOUNTER — Other Ambulatory Visit (HOSPITAL_COMMUNITY)
Admission: RE | Admit: 2014-10-14 | Discharge: 2014-10-14 | Disposition: A | Discharge status: Home / Self Care | Payer: No Typology Code available for payment source | Source: Ambulatory Visit | Attending: Family Medicine | Admitting: Family Medicine

## 2014-10-14 DIAGNOSIS — N760 Acute vaginitis: Secondary | ICD-10-CM | POA: Insufficient documentation

## 2014-10-14 DIAGNOSIS — N898 Other specified noninflammatory disorders of vagina: Secondary | ICD-10-CM | POA: Diagnosis not present

## 2014-10-14 DIAGNOSIS — Z113 Encounter for screening for infections with a predominantly sexual mode of transmission: Secondary | ICD-10-CM | POA: Diagnosis present

## 2014-10-14 LAB — POCT PREGNANCY, URINE: Preg Test, Ur: NEGATIVE

## 2014-10-14 LAB — POCT URINALYSIS DIP (DEVICE)
BILIRUBIN URINE: NEGATIVE
GLUCOSE, UA: NEGATIVE mg/dL
KETONES UR: NEGATIVE mg/dL
Leukocytes, UA: NEGATIVE
Nitrite: NEGATIVE
Protein, ur: NEGATIVE mg/dL
Urobilinogen, UA: 0.2 mg/dL (ref 0.0–1.0)
pH: 6 (ref 5.0–8.0)

## 2014-10-14 NOTE — ED Provider Notes (Signed)
CSN: [id number]     Arrival date & time 10/14/14  1307 History   First MD Initiated Contact with Patient 10/14/14 1317     Chief Complaint  Patient presents with  . Vaginal Discharge   (Consider location/radiation/quality/duration/timing/severity/associated sxs/prior Treatment) Patient is a 34 y.o. female presenting with STD exposure. The history is provided by the patient.  Exposure to STD This is a new problem. The current episode started more than 1 week ago. Pertinent negatives include no chest pain, no abdominal pain and no shortness of breath. Nothing aggravates the symptoms. Nothing relieves the symptoms.    Past Medical History  Diagnosis Date  . Ovarian cyst   . Viral labyrinthitis   . DENTAL PAIN   . IRREGULAR MENSTRUATION   . FEMALE INFERTILITY   . BOILS, RECURRENT   . SEPTATE UTERUS   . Endometriosis   . Breast tenderness in female   . Gardnerella vaginalis infection   . Abscess   . BV (bacterial vaginosis)    Past Surgical History  Procedure Laterality Date  . Ovarian cyst removal  Mar 25 2010  . Biopsy of right breast    . Breast biopsy  1999    right  . Ovarian cyst removal  03/25/2010    right   Family History  Problem Relation Age of Onset  . Hypertension Mother   . Diabetes Mother   . Cancer Father   . Hypertension Father   . Diabetes Brother   . Hypertension Brother    Social History  Substance Use Topics  . Smoking status: Current Every Day Smoker -- 0.50 packs/day for 10 years    Types: Cigarettes  . Smokeless tobacco: Never Used  . Alcohol Use: Yes     Comment: occas.   OB History    Gravida Para Term Preterm AB TAB SAB Ectopic Multiple Living   0         0     Review of Systems  Constitutional: Negative.  Negative for fever.  Respiratory: Negative for shortness of breath.   Cardiovascular: Negative for chest pain.  Gastrointestinal: Negative.  Negative for abdominal pain.  Genitourinary: Positive for vaginal discharge. Negative for  dysuria, frequency, vaginal bleeding, menstrual problem and pelvic pain.  All other systems reviewed and are negative.   Allergies  Flagyl  Home Medications   Prior to Admission medications   Medication Sig Start Date End Date Taking? Authorizing Provider  amoxicillin (AMOXIL) 500 MG capsule Take 1 capsule (500 mg total) by mouth 3 (three) times daily. 06/12/14   Jennifer Piepenbrink, PA-C  clindamycin (CLEOCIN) 2 % vaginal cream Place 1 Applicatorful vaginally at bedtime. 08/08/14   Gregor Hams, MD  clindamycin (CLEOCIN) 300 MG capsule Take 1 capsule (300 mg total) by mouth 3 (three) times daily. 08/09/14   Gregor Hams, MD  fluconazole (DIFLUCAN) 150 MG tablet Take 1 tablet (150 mg total) by mouth once. Repeat in 3 days if needed. 08/29/14   Melony Overly, MD  Ibuprofen 200 MG CAPS Take 400 mg by mouth every 4 (four) hours as needed (pain).    Historical Provider, MD  lidocaine (XYLOCAINE) 2 % solution Use as directed 20 mLs in the mouth or throat as needed for mouth pain. 06/12/14   Jennifer Piepenbrink, PA-C  metroNIDAZOLE (FLAGYL) 500 MG tablet Take 1 tablet (500 mg total) by mouth 2 (two) times daily. 07/05/14   Gregor Hams, MD  naproxen (NAPROSYN) 500 MG tablet Take 1 tablet (  500 mg total) by mouth 2 (two) times daily with a meal. 06/12/14   Jennifer Piepenbrink, PA-C  ondansetron (ZOFRAN) 4 MG tablet Take 1 tablet (4 mg total) by mouth every 6 (six) hours. 04/07/14   Jeannett Senior, PA-C   Meds Ordered and Administered this Visit  Medications - No data to display  BP 131/81 mmHg  Pulse 66  Temp(Src) 98.2 F (36.8 C) (Oral)  Resp 18  SpO2 99%  [name] 10/13/2014 No data found.   Physical Exam  Constitutional: She is oriented to person, place, and time. She appears well-developed and well-nourished.  Abdominal: Soft. Bowel sounds are normal. She exhibits no mass. There is no tenderness.  Genitourinary: Vagina normal and uterus normal. No vaginal discharge found.  Neurological: She  is alert and oriented to person, place, and time.  Nursing note and vitals reviewed.   ED Course  Procedures (including critical care time)  Labs Review Labs Reviewed  POCT URINALYSIS DIP (DEVICE) - Abnormal; Notable for the following:    Hgb urine dipstick TRACE (*)    All other components within normal limits  HIV ANTIBODY (ROUTINE TESTING)  RPR  POCT PREGNANCY, URINE  CERVICOVAGINAL ANCILLARY ONLY    Imaging Review No results found.   Visual Acuity Review  Right Eye Distance:   Left Eye Distance:   Bilateral Distance:    Right Eye Near:   Left Eye Near:    Bilateral Near:         MDM   1. Vaginal discharge       Billy Fischer, MD 10/14/14 1341

## 2014-10-14 NOTE — Discharge Instructions (Signed)
We will call with positive test results and treat as indicated  °

## 2014-10-14 NOTE — ED Notes (Signed)
Pt  Reports  Symptoms  Of a  Vaginal discharge        Pt  Also is  Requesting  hiv  And     Test  For  syphyllis          Pt  Reports  As  Well  Symptoms  Of  Low  abd  Cramping

## 2014-10-15 LAB — RPR: RPR Ser Ql: NONREACTIVE

## 2014-10-15 LAB — HIV ANTIBODY (ROUTINE TESTING W REFLEX): HIV Screen 4th Generation wRfx: NONREACTIVE

## 2014-10-16 LAB — CERVICOVAGINAL ANCILLARY ONLY
CHLAMYDIA, DNA PROBE: NEGATIVE
Neisseria Gonorrhea: NEGATIVE

## 2014-10-17 LAB — CERVICOVAGINAL ANCILLARY ONLY: Wet Prep (BD Affirm): POSITIVE — AB

## 2014-10-25 NOTE — ED Notes (Signed)
Final report of vaginal swab testing positive for gardnerella, not treated. Allergic to Flagyl. Discussed w patinet, and will call Rx to [facility] , [address] authorized Azithromycin 150 , TID #21. Spoke directly w pharmacy staff

## 2014-11-12 ENCOUNTER — Ambulatory Visit: Payer: No Typology Code available for payment source | Admitting: Obstetrics & Gynecology

## 2014-11-29 ENCOUNTER — Encounter: Payer: Self-pay | Admitting: Obstetrics & Gynecology

## 2014-11-29 ENCOUNTER — Ambulatory Visit (INDEPENDENT_AMBULATORY_CARE_PROVIDER_SITE_OTHER): Payer: No Typology Code available for payment source | Admitting: Obstetrics & Gynecology

## 2014-11-29 VITALS — BP 107/82 | HR 78 | Resp 18 | Ht 62.0 in | Wt 177.0 lb

## 2014-11-29 DIAGNOSIS — A499 Bacterial infection, unspecified: Secondary | ICD-10-CM | POA: Diagnosis not present

## 2014-11-29 DIAGNOSIS — N760 Acute vaginitis: Secondary | ICD-10-CM | POA: Diagnosis not present

## 2014-11-29 DIAGNOSIS — B9689 Other specified bacterial agents as the cause of diseases classified elsewhere: Secondary | ICD-10-CM

## 2014-11-29 DIAGNOSIS — Z01419 Encounter for gynecological examination (general) (routine) without abnormal findings: Secondary | ICD-10-CM

## 2014-11-29 MED ORDER — METRONIDAZOLE 0.75 % VA GEL: 1.0000 | Freq: Every day | VAGINAL | Status: DC

## 2014-11-29 NOTE — Progress Notes (Signed)
Patient ID: [name], female   DOB: [birth year], 34 y.o.   MRN: [id number]  Chief Complaint  Patient presents with  . Annual Exam  check for vaginal discharge  HPI [name] is a 34 y.o. female.  G0P0 Patient's last menstrual period was 11/05/2014. She has sx of BV with discharge and odor, cannot take metronidazole orally due to nausea.   HPI  Past Medical History  Diagnosis Date  . Ovarian cyst   . Viral labyrinthitis   . DENTAL PAIN   . IRREGULAR MENSTRUATION   . FEMALE INFERTILITY   . BOILS, RECURRENT   . SEPTATE UTERUS   . Endometriosis   . Breast tenderness in female   . Gardnerella vaginalis infection   . Abscess   . BV (bacterial vaginosis)     Past Surgical History  Procedure Laterality Date  . Ovarian cyst removal  Mar 25 2010  . Biopsy of right breast    . Breast biopsy  1999    right  . Ovarian cyst removal  03/25/2010    right    Family History  Problem Relation Age of Onset  . Hypertension Mother   . Diabetes Mother   . Cancer Father   . Hypertension Father   . Diabetes Brother   . Hypertension Brother     Social History Social History  Substance Use Topics  . Smoking status: Current Every Day Smoker -- 0.50 packs/day for 10 years    Types: Cigarettes  . Smokeless tobacco: Never Used  . Alcohol Use: Yes     Comment: occas.    Allergies  Allergen Reactions  . Flagyl [Metronidazole] Nausea And Vomiting    Current Outpatient Prescriptions  Medication Sig Dispense Refill  . Ibuprofen 200 MG CAPS Take 400 mg by mouth every 4 (four) hours as needed (pain).    . naproxen (NAPROSYN) 500 MG tablet Take 1 tablet (500 mg total) by mouth 2 (two) times daily with a meal. (Patient not taking: Reported on 11/29/2014) 30 tablet 0  . ondansetron (ZOFRAN) 4 MG tablet Take 1 tablet (4 mg total) by mouth every 6 (six) hours. (Patient not taking: Reported on 11/29/2014) 12 tablet 0  . [DISCONTINUED] Norgestimate-Ethinyl Estradiol Triphasic (TRI-SPRINTEC)  0.18/0.215/0.25 MG-35 MCG tablet Take 1 tablet by mouth daily. 1 Package 11   No current facility-administered medications for this visit.    Review of Systems Review of Systems  Constitutional: Negative.   Gastrointestinal: Negative.   Genitourinary: Positive for vaginal discharge. Negative for vaginal bleeding, vaginal pain, menstrual problem (flow lasts 7 days) and pelvic pain.    Blood pressure 107/82, pulse 78, resp. rate 18, height 5\' 2"  (1.575 m), weight 177 lb (80.287 kg), last menstrual period 11/05/2014.  Physical Exam Physical Exam  Constitutional: She is oriented to person, place, and time. She appears well-developed. No distress.  Pulmonary/Chest: Effort normal. No respiratory distress.  Breasts: breasts appear normal, no suspicious masses, no skin or nipple changes or axillary nodes.   Abdominal: Soft. She exhibits no mass. There is no tenderness.  Genitourinary: Uterus normal. Vaginal discharge (wet prep done) found.  No mass no tenderness  Neurological: She is alert and oriented to person, place, and time.  Skin: Skin is warm and dry.  Psychiatric: She has a normal mood and affect. Her behavior is normal.    Data Reviewed Pap last year negative  Assessment    Suspect BV, sensitive to Flagyl    Well woman exam No pelvic mass, h/o  ovarian cyst Plan    Metrogel vaginal as directed  Yearly  Exam, pap not before 2018       Sadiel Mota 11/29/2014, 3:27 PM

## 2014-11-29 NOTE — Patient Instructions (Signed)

## 2014-11-30 LAB — WET PREP, GENITAL
TRICH WET PREP: NONE SEEN
Yeast Wet Prep HPF POC: NONE SEEN

## 2014-12-18 ENCOUNTER — Other Ambulatory Visit (HOSPITAL_COMMUNITY)
Admission: RE | Admit: 2014-12-18 | Discharge: 2014-12-18 | Disposition: A | Discharge status: Home / Self Care | Payer: No Typology Code available for payment source | Source: Ambulatory Visit | Attending: Emergency Medicine | Admitting: Emergency Medicine

## 2014-12-18 ENCOUNTER — Encounter (HOSPITAL_COMMUNITY): Payer: Self-pay | Admitting: Emergency Medicine

## 2014-12-18 ENCOUNTER — Emergency Department (INDEPENDENT_AMBULATORY_CARE_PROVIDER_SITE_OTHER)
Admission: EM | Admit: 2014-12-18 | Discharge: 2014-12-18 | Disposition: A | Discharge status: Home / Self Care | Payer: No Typology Code available for payment source | Source: Home / Self Care | Attending: Emergency Medicine | Admitting: Emergency Medicine

## 2014-12-18 DIAGNOSIS — R300 Dysuria: Secondary | ICD-10-CM | POA: Diagnosis not present

## 2014-12-18 DIAGNOSIS — B9689 Other specified bacterial agents as the cause of diseases classified elsewhere: Secondary | ICD-10-CM

## 2014-12-18 DIAGNOSIS — Z113 Encounter for screening for infections with a predominantly sexual mode of transmission: Secondary | ICD-10-CM | POA: Insufficient documentation

## 2014-12-18 DIAGNOSIS — A499 Bacterial infection, unspecified: Secondary | ICD-10-CM | POA: Diagnosis not present

## 2014-12-18 DIAGNOSIS — N760 Acute vaginitis: Secondary | ICD-10-CM | POA: Insufficient documentation

## 2014-12-18 LAB — POCT PREGNANCY, URINE: PREG TEST UR: NEGATIVE

## 2014-12-18 LAB — POCT URINALYSIS DIP (DEVICE)
BILIRUBIN URINE: NEGATIVE
Glucose, UA: NEGATIVE mg/dL
HGB URINE DIPSTICK: NEGATIVE
KETONES UR: NEGATIVE mg/dL
LEUKOCYTES UA: NEGATIVE
NITRITE: NEGATIVE
Protein, ur: NEGATIVE mg/dL
Specific Gravity, Urine: 1.02 (ref 1.005–1.030)
Urobilinogen, UA: 0.2 mg/dL (ref 0.0–1.0)
pH: 7 (ref 5.0–8.0)

## 2014-12-18 MED ORDER — NAPROXEN 500 MG PO TABS: 500.0000 mg | ORAL_TABLET | Freq: Two times a day (BID) | ORAL | Status: DC

## 2014-12-18 MED ORDER — AZITHROMYCIN 250 MG PO TABS
ORAL_TABLET | ORAL | Status: AC
  Filled 2014-12-18: qty 4

## 2014-12-18 MED ORDER — METRONIDAZOLE 0.75 % VA GEL: 1.0000 | Freq: Two times a day (BID) | VAGINAL | Status: DC

## 2014-12-18 MED ORDER — PHENAZOPYRIDINE HCL 200 MG PO TABS: 200.0000 mg | ORAL_TABLET | Freq: Three times a day (TID) | ORAL | Status: DC | PRN

## 2014-12-18 MED ORDER — AZITHROMYCIN 250 MG PO TABS
1000.0000 mg | ORAL_TABLET | Freq: Once | ORAL | Status: AC
  Administered 2014-12-18: 1000 mg via ORAL

## 2014-12-18 MED ORDER — LIDOCAINE HCL (PF) 1 % IJ SOLN
INTRAMUSCULAR | Status: AC
  Filled 2014-12-18: qty 5

## 2014-12-18 MED ORDER — CEFTRIAXONE SODIUM 250 MG IJ SOLR
INTRAMUSCULAR | Status: AC
  Filled 2014-12-18: qty 250

## 2014-12-18 MED ORDER — CEFTRIAXONE SODIUM 250 MG IJ SOLR
250.0000 mg | Freq: Once | INTRAMUSCULAR | Status: AC
  Administered 2014-12-18: 250 mg via INTRAMUSCULAR

## 2014-12-18 NOTE — Discharge Instructions (Signed)
Increase fluids, restart the Naprosyn. Try the Pyridium which will help with the urinary symptoms. Take the medication as written. Give [place] a working phone number so that we can contact you if needed. Refrain from sexual contact until you know your results and your partner(s) are treated. Return if you get worse, have a fever >100.4, or for any concerns.   Go to www.goodrx.com to look up your medications. This will give you a list of where you can find your prescriptions at the most affordable prices.

## 2014-12-18 NOTE — ED Notes (Signed)
Std screen, frequent urination, and pelvic pain for 3 days.

## 2014-12-18 NOTE — ED Provider Notes (Signed)
HPI  SUBJECTIVE:  [name] is a 34 y.o. female who presents with  3 days oderous vaginal discharge, urinary frequency, dysuria, lower pelvic pain. No aggravating or alleviating factors. She has tried Advil for this. She denies nausea, vomiting, fevers, other abdominal pain, back pain. No vaginal bleeding, general rash, genital itching. She started clindamycin yesterday for BV which she gets repeatedly. No urinary urgency, cloudy or odorous urine, hematuria. No history of diabetes.  No urgency, frequency, dysuria, oderous urine, hematuria,  genital blisters, vaginal itching. No aggravating, alleviating factors. Has not tried anything for this. . Pt sexually active with same female  partner who is asxatic.  States that the condom broke. STD's a concern today. Past medical history of BV, yeast infections. No history of diabetes, UTI, nephrolithiasis, polynephritis, gonorrhea chlamydia, Trichomonas,syphilis, herpes, HIV.     Past Medical History  Diagnosis Date  . Ovarian cyst   . Viral labyrinthitis   . DENTAL PAIN   . IRREGULAR MENSTRUATION   . FEMALE INFERTILITY   . BOILS, RECURRENT   . SEPTATE UTERUS   . Endometriosis   . Breast tenderness in female   . Gardnerella vaginalis infection   . Abscess   . BV (bacterial vaginosis)     Past Surgical History  Procedure Laterality Date  . Ovarian cyst removal  Mar 25 2010  . Biopsy of right breast    . Breast biopsy  1999    right  . Ovarian cyst removal  03/25/2010    right    Family History  Problem Relation Age of Onset  . Hypertension Mother   . Diabetes Mother   . Cancer Father   . Hypertension Father   . Diabetes Brother   . Hypertension Brother     Social History  Substance Use Topics  . Smoking status: Current Every Day Smoker -- 0.50 packs/day for 10 years    Types: Cigarettes  . Smokeless tobacco: Never Used  . Alcohol Use: Yes     Comment: occas.    No current facility-administered medications for this  encounter.  Current outpatient prescriptions:  .  metroNIDAZOLE (METROGEL) 0.75 % vaginal gel, Place 1 Applicatorful vaginally 2 (two) times daily. Apply one applicatorful to vagina twice a day for 5 days, Disp: 70 g, Rfl: 1 .  naproxen (NAPROSYN) 500 MG tablet, Take 1 tablet (500 mg total) by mouth 2 (two) times daily with a meal., Disp: 30 tablet, Rfl: 0 .  phenazopyridine (PYRIDIUM) 200 MG tablet, Take 1 tablet (200 mg total) by mouth 3 (three) times daily as needed for pain., Disp: 6 tablet, Rfl: 0 .  [DISCONTINUED] Norgestimate-Ethinyl Estradiol Triphasic (TRI-SPRINTEC) 0.18/0.215/0.25 MG-35 MCG tablet, Take 1 tablet by mouth daily., Disp: 1 Package, Rfl: 11  Allergies  Allergen Reactions  . Flagyl [Metronidazole] Nausea And Vomiting     ROS  As noted in HPI.   Physical Exam  BP 109/73 mmHg  Pulse 68  Temp(Src) 98.4 F (36.9 C) (Oral)  Resp 16  SpO2 100%  [name] 11/01/2014  Constitutional: Well developed, well nourished, no acute distress Eyes:  EOMI, conjunctiva normal bilaterally HENT: Normocephalic, atraumatic,mucus membranes moist Respiratory: Normal inspiratory effort Cardiovascular: Normal rate GI: nondistended soft, nontender. Mild tenderness L UVJ. No suprapubic, flank tenderness. NO RLQ tenderness back: No CVA tenderness GU: External genitalia normal.  Normal vaginal mucosa.  Normal os. thick oderous   white vaginal discharge.   Uterus smooth NT. No CMT. No adnexal tenderness. No adnexal masses.  Chaperone present  during exam skin: No rash, skin intact Musculoskeletal: no deformities Neurologic: Alert & oriented x 3, no focal neuro deficits Psychiatric: Speech and behavior appropriate   ED Course   Medications  azithromycin (ZITHROMAX) tablet 1,000 mg (1,000 mg Oral Given 12/18/14 1543)  cefTRIAXone (ROCEPHIN) injection 250 mg (250 mg Intramuscular Given 12/18/14 1543)    Orders Placed This Encounter  Procedures  . Pelvic exam    Standing Status: Standing      Number of Occurrences: 1     Standing Expiration Date:   . Urine culture    Standing Status: Standing     Number of Occurrences: 1     Standing Expiration Date:     Order Specific Question:  List patient's active antibiotics    Answer:  flagyl  . POCT urinalysis dip (device)    Standing Status: Standing     Number of Occurrences: 1     Standing Expiration Date:   . Pregnancy, urine POC    Standing Status: Standing     Number of Occurrences: 1     Standing Expiration Date:     Results for orders placed or performed during the hospital encounter of 12/18/14 (from the past 24 hour(s))  POCT urinalysis dip (device)     Status: None   Collection Time: 12/18/14  3:12 PM  Result Value Ref Range   Glucose, UA NEGATIVE NEGATIVE mg/dL   Bilirubin Urine NEGATIVE NEGATIVE   Ketones, ur NEGATIVE NEGATIVE mg/dL   Specific Gravity, Urine 1.020 1.005 - 1.030   Hgb urine dipstick NEGATIVE NEGATIVE   pH 7.0 5.0 - 8.0   Protein, ur NEGATIVE NEGATIVE mg/dL   Urobilinogen, UA 0.2 0.0 - 1.0 mg/dL   Nitrite NEGATIVE NEGATIVE   Leukocytes, UA NEGATIVE NEGATIVE  Pregnancy, urine POC     Status: None   Collection Time: 12/18/14  3:16 PM  Result Value Ref Range   Preg Test, Ur NEGATIVE NEGATIVE   No results found.  ED Clinical Impression  Dysuria  BV (bacterial vaginosis) - Plan: metroNIDAZOLE (METROGEL) 0.75 % vaginal gel   ED Assessment/Plan  Reviewed previous labs. Patient has tested negative for gonorrhea, chlamydia, HIV, syphilis repeatedly since 4/1, but has had BV 5 times.   Urine pregnancy negative. UA negative for UTI. May have urethral irritation from a STI, BV, or yeast. Given that patient has multiple episodes of BV, odorous discharge, and no vaginal itching, we'll treat empirically for BV with Flagyl intravaginally. We'll send urine off for culture to rule out UTI. Otherwise, home with increased fluids, Naprosyn, Pyridium symptomatic treatment. No evidence of PID.  H&P most  c/w BV. Sent off GC/chlamydia, wet prep. Will  treat empirically for gonorrhea and chlamydia now per patient request. Giving ceftriaxone 250 mg IM, azithro 1 gm po. Will send home with flagyl per vagina, continue the oral clindamycin  Advised pt to refrain from sexual contact until she  knows lab results, symptoms resolve, and partner(s) are treated if necessary. Pt provided working phone number. Pt agrees with plan.    *This clinic note was created using Dragon dictation software. Therefore, there may be occasional mistakes despite careful proofreading.  ?    Melynda Ripple, MD 12/18/14 [id number]

## 2014-12-18 NOTE — ED Notes (Signed)
Supplied crackers to patient while waiting for post injection hold before being discharged

## 2014-12-19 LAB — CERVICOVAGINAL ANCILLARY ONLY
Chlamydia: NEGATIVE
NEISSERIA GONORRHEA: NEGATIVE

## 2014-12-19 LAB — URINE CULTURE

## 2014-12-20 ENCOUNTER — Encounter (HOSPITAL_COMMUNITY): Payer: Self-pay | Admitting: *Deleted

## 2014-12-20 ENCOUNTER — Inpatient Hospital Stay (HOSPITAL_COMMUNITY)
Admission: AD | Admit: 2014-12-20 | Discharge: 2014-12-20 | Disposition: A | Discharge status: Home / Self Care | Payer: No Typology Code available for payment source | Source: Ambulatory Visit | Attending: Family Medicine | Admitting: Family Medicine

## 2014-12-20 DIAGNOSIS — R1084 Generalized abdominal pain: Secondary | ICD-10-CM | POA: Diagnosis not present

## 2014-12-20 DIAGNOSIS — F17210 Nicotine dependence, cigarettes, uncomplicated: Secondary | ICD-10-CM | POA: Insufficient documentation

## 2014-12-20 DIAGNOSIS — R300 Dysuria: Secondary | ICD-10-CM | POA: Diagnosis not present

## 2014-12-20 DIAGNOSIS — M549 Dorsalgia, unspecified: Secondary | ICD-10-CM | POA: Diagnosis present

## 2014-12-20 DIAGNOSIS — Z3202 Encounter for pregnancy test, result negative: Secondary | ICD-10-CM | POA: Insufficient documentation

## 2014-12-20 DIAGNOSIS — R350 Frequency of micturition: Secondary | ICD-10-CM | POA: Insufficient documentation

## 2014-12-20 DIAGNOSIS — Z113 Encounter for screening for infections with a predominantly sexual mode of transmission: Secondary | ICD-10-CM

## 2014-12-20 DIAGNOSIS — M545 Low back pain: Secondary | ICD-10-CM

## 2014-12-20 DIAGNOSIS — Z711 Person with feared health complaint in whom no diagnosis is made: Secondary | ICD-10-CM

## 2014-12-20 LAB — URINALYSIS, ROUTINE W REFLEX MICROSCOPIC
Bilirubin Urine: NEGATIVE
Glucose, UA: NEGATIVE mg/dL
Hgb urine dipstick: NEGATIVE
Ketones, ur: NEGATIVE mg/dL
Leukocytes, UA: NEGATIVE
Nitrite: NEGATIVE
Protein, ur: NEGATIVE mg/dL
Specific Gravity, Urine: >1.03 — ABNORMAL HIGH (ref 1.005–1.030)
Urobilinogen, UA: 0.2 mg/dL (ref 0.0–1.0)
pH: 5.5 (ref 5.0–8.0)

## 2014-12-20 LAB — CERVICOVAGINAL ANCILLARY ONLY: Wet Prep (BD Affirm): POSITIVE — AB

## 2014-12-20 NOTE — MAU Provider Note (Signed)
History     CSN: [id number]  Arrival date and time: 12/20/14 2032   First Provider Initiated Contact with Patient 12/20/14 2138      Chief Complaint  Patient presents with  . Urinary Frequency  . Back Pain   HPI Comments: [name] is a 34 y.o. G0P0 who presents today with back pain and urinary frequency. She was seen on UC on 12/18/14 for these same symptoms. She was started on Flagyl at that visit, and UC showed multiple species. Patient states that she would like repeat testing for all STDs today.   Urinary Frequency  This is a new problem. The current episode started in the past 7 days. The problem occurs every urination. The problem has been unchanged. The quality of the pain is described as burning. The pain is at a severity of 6/10. There has been no fever. She is sexually active. There is no history of pyelonephritis. Associated symptoms include frequency. Pertinent negatives include no nausea, urgency or vomiting. She has tried nothing for the symptoms. The treatment provided no relief.  Back Pain This is a new problem. The current episode started in the past 7 days. The problem occurs constantly. The problem is unchanged. The pain is present in the lumbar spine. The quality of the pain is described as cramping. Radiates to: suprapubic region  The pain is at a severity of 6/10. Associated symptoms include abdominal pain and dysuria. Pertinent negatives include no fever. She has tried nothing for the symptoms.     Past Medical History  Diagnosis Date  . Ovarian cyst   . Viral labyrinthitis   . DENTAL PAIN   . IRREGULAR MENSTRUATION   . FEMALE INFERTILITY   . BOILS, RECURRENT   . SEPTATE UTERUS   . Endometriosis   . Breast tenderness in female   . Gardnerella vaginalis infection   . Abscess   . BV (bacterial vaginosis)     Past Surgical History  Procedure Laterality Date  . Ovarian cyst removal  Mar 25 2010  . Biopsy of right breast    . Breast biopsy  1999   right  . Ovarian cyst removal  03/25/2010    right    Family History  Problem Relation Age of Onset  . Hypertension Mother   . Diabetes Mother   . Cancer Father   . Hypertension Father   . Diabetes Brother   . Hypertension Brother     Social History  Substance Use Topics  . Smoking status: Current Every Day Smoker -- 0.50 packs/day for 10 years    Types: Cigarettes  . Smokeless tobacco: Never Used  . Alcohol Use: Yes     Comment: occas.    Allergies:  Allergies  Allergen Reactions  . Flagyl [Metronidazole] Nausea And Vomiting    Prescriptions prior to admission  Medication Sig Dispense Refill Last Dose  . B Complex Vitamins (B COMPLEX-B12 PO) Take 2 tablets by mouth.   12/20/2014 at Unknown time  . metroNIDAZOLE (FLAGYL) 500 MG tablet Take 500 mg by mouth 2 (two) times daily. 7 day course started 2 days ago for bacterial infection   12/20/2014 at Unknown time    Review of Systems  Constitutional: Negative for fever.  Gastrointestinal: Positive for abdominal pain. Negative for nausea, vomiting, diarrhea and constipation.  Genitourinary: Positive for dysuria and frequency. Negative for urgency.  Musculoskeletal: Positive for back pain.   Physical Exam   Blood pressure 112/68, pulse 78, temperature 98 F (36.7 C),  temperature source Oral, resp. rate 16, height 5\' 2"  (1.575 m), weight 82.736 kg (182 lb 6.4 oz), last menstrual period 12/01/2014, SpO2 100 %.  Physical Exam  Nursing note and vitals reviewed. Constitutional: She is oriented to person, place, and time. She appears well-developed and well-nourished. No distress.  HENT:  Head: Normocephalic.  Cardiovascular: Normal rate.   Respiratory: Effort normal.  GI: Soft. There is no tenderness. There is no rebound.  Genitourinary:   External: no lesion Vagina: small amount of white discharge    Neurological: She is alert and oriented to person, place, and time.  Skin: Skin is warm and dry.  Psychiatric: She  has a normal mood and affect.   Results for orders placed or performed during the hospital encounter of 12/20/14 (from the past 24 hour(s))  Urinalysis, Routine w reflex microscopic (not at [facility])     Status: Abnormal   Collection Time: 12/20/14  8:51 PM  Result Value Ref Range   Color, Urine YELLOW YELLOW   APPearance CLEAR CLEAR   Specific Gravity, Urine >1.030 (H) 1.005 - 1.030   pH 5.5 5.0 - 8.0   Glucose, UA NEGATIVE NEGATIVE mg/dL   Hgb urine dipstick NEGATIVE NEGATIVE   Bilirubin Urine NEGATIVE NEGATIVE   Ketones, ur NEGATIVE NEGATIVE mg/dL   Protein, ur NEGATIVE NEGATIVE mg/dL   Urobilinogen, UA 0.2 0.0 - 1.0 mg/dL   Nitrite NEGATIVE NEGATIVE   Leukocytes, UA NEGATIVE NEGATIVE   Pregnancy test: NEGATIVE  MAU Course  Procedures  MDM   Assessment and Plan   1. Dysuria   2. Concern about STD in female without diagnosis    DC home Comfort measures reviewed  UC pending GC/CT pending, HIV/RPR pending  RX: none, continue flagyl  Return to MAU as needed FU with OB as planned  Follow-up Information    Follow up with [facility].   Why:  If symptoms worsen   Contact information:   [place] [address] [phone]       Mathis Bud 12/20/2014, 9:39 PM

## 2014-12-20 NOTE — MAU Note (Signed)
Pt's Urine Pregnancy Test was NEG.  12/20/2014 @2059 

## 2014-12-20 NOTE — MAU Note (Signed)
Pt c/o increased frequency and urgency with urination and some lower abdominal pain-feels crampy. Denies pain or burning. [name]: 12/01/2014.

## 2014-12-20 NOTE — ED Notes (Signed)
Chlamydia, GC reports negative, positive for gardnerella. treatment adequate. Unable to leave message or contact patient at this time; will try later

## 2014-12-20 NOTE — ED Notes (Signed)
Patient returned call. Will return for repeat UA sample for C&S. As she does not have a PCP

## 2014-12-21 LAB — GC/CHLAMYDIA PROBE AMP (~~LOC~~) NOT AT ARMC
CHLAMYDIA, DNA PROBE: NEGATIVE
NEISSERIA GONORRHEA: NEGATIVE

## 2014-12-21 LAB — HIV ANTIBODY (ROUTINE TESTING W REFLEX): HIV Screen 4th Generation wRfx: NONREACTIVE

## 2014-12-21 LAB — RPR: RPR: NONREACTIVE

## 2014-12-21 NOTE — Progress Notes (Signed)
Pt called and left message regarding desiring to have urine culture results.  Pt called back and informed that urine culture results are not available yet. I explained the urine culture results process - print out in AM and will call if UTI.

## 2014-12-22 LAB — URINE CULTURE

## 2014-12-24 ENCOUNTER — Telehealth: Payer: Self-pay | Admitting: *Deleted

## 2014-12-24 NOTE — Telephone Encounter (Signed)
[name] called today and left a message she was here on 20th to see Dr. Roselie Awkward- he ordered metronidazole gel and it cost about $100- want to see if can change to pill form which is much cheaper.  Can sent to [facility] on [address]. Please call me back, I may be at work.    Per chart review sensitive to flagyl.

## 2014-12-25 NOTE — Telephone Encounter (Signed)
Pt called again this morning @ 0848 stating that she would like the pill form of Flagyl because the gel is too expensive. Pt's chart indicates she has sensitivity to Flagyl which is why Dr. Roselie Awkward recommended Metrogel. I called and spoke to pharmacist @ [facility] who stated that they did not receive Rx for Metrogel on 10/20. I requested if she would check on the cost of Tindamax or Metrogel and I will call back to receive the information. I left message for pt that I was investigating some possibilities for her and will call back once I have additional information.

## 2014-12-26 MED ORDER — METRONIDAZOLE 500 MG PO TABS: 500.0000 mg | ORAL_TABLET | Freq: Two times a day (BID) | ORAL | Status: DC

## 2014-12-26 NOTE — Telephone Encounter (Signed)
Pt called again stating that she would like to take the Flagyl pills instead. She doesn't want anything else but flagyl. Called patient and she stated that she is not allergic. It just makes her have stomach upset.

## 2015-01-08 ENCOUNTER — Telehealth: Payer: Self-pay | Admitting: *Deleted

## 2015-01-08 MED ORDER — METRONIDAZOLE 500 MG PO TABS: 500.0000 mg | ORAL_TABLET | Freq: Two times a day (BID) | ORAL | Status: AC

## 2015-01-08 NOTE — Telephone Encounter (Signed)
Patient called and stated that she has BV again and needs a prescription for flagyl. She states that she gets these infections frequently and Dr. Roselie Awkward knows about it.

## 2015-01-21 ENCOUNTER — Other Ambulatory Visit: Payer: Self-pay | Admitting: Obstetrics & Gynecology

## 2015-01-21 DIAGNOSIS — N63 Unspecified lump in unspecified breast: Secondary | ICD-10-CM

## 2015-02-08 ENCOUNTER — Inpatient Hospital Stay (HOSPITAL_COMMUNITY)
Admission: AD | Admit: 2015-02-08 | Discharge: 2015-02-08 | Disposition: A | Discharge status: Home / Self Care | Payer: No Typology Code available for payment source | Source: Ambulatory Visit | Attending: Obstetrics & Gynecology | Admitting: Obstetrics & Gynecology

## 2015-02-08 ENCOUNTER — Encounter (HOSPITAL_COMMUNITY): Payer: Self-pay | Admitting: *Deleted

## 2015-02-08 DIAGNOSIS — R109 Unspecified abdominal pain: Secondary | ICD-10-CM | POA: Insufficient documentation

## 2015-02-08 DIAGNOSIS — M549 Dorsalgia, unspecified: Secondary | ICD-10-CM | POA: Diagnosis present

## 2015-02-08 DIAGNOSIS — B9689 Other specified bacterial agents as the cause of diseases classified elsewhere: Secondary | ICD-10-CM

## 2015-02-08 DIAGNOSIS — F17210 Nicotine dependence, cigarettes, uncomplicated: Secondary | ICD-10-CM | POA: Diagnosis not present

## 2015-02-08 DIAGNOSIS — N898 Other specified noninflammatory disorders of vagina: Secondary | ICD-10-CM | POA: Diagnosis not present

## 2015-02-08 DIAGNOSIS — A499 Bacterial infection, unspecified: Secondary | ICD-10-CM | POA: Diagnosis not present

## 2015-02-08 DIAGNOSIS — N83209 Unspecified ovarian cyst, unspecified side: Secondary | ICD-10-CM | POA: Insufficient documentation

## 2015-02-08 DIAGNOSIS — N760 Acute vaginitis: Secondary | ICD-10-CM | POA: Insufficient documentation

## 2015-02-08 LAB — URINALYSIS, ROUTINE W REFLEX MICROSCOPIC
BILIRUBIN URINE: NEGATIVE
Glucose, UA: NEGATIVE mg/dL
Hgb urine dipstick: NEGATIVE
KETONES UR: NEGATIVE mg/dL
LEUKOCYTES UA: NEGATIVE
NITRITE: NEGATIVE
PH: 6 (ref 5.0–8.0)
PROTEIN: NEGATIVE mg/dL
Specific Gravity, Urine: 1.025 (ref 1.005–1.030)

## 2015-02-08 LAB — WET PREP, GENITAL
TRICH WET PREP: NONE SEEN
YEAST WET PREP: NONE SEEN

## 2015-02-08 LAB — POCT PREGNANCY, URINE: PREG TEST UR: NEGATIVE

## 2015-02-08 MED ORDER — METRONIDAZOLE 500 MG PO TABS: 500.0000 mg | ORAL_TABLET | Freq: Two times a day (BID) | ORAL | Status: DC

## 2015-02-08 NOTE — Discharge Instructions (Signed)

## 2015-02-08 NOTE — MAU Provider Note (Signed)
History     CSN: [id number]  Arrival date and time: 02/08/15 [id number]   First Provider Initiated Contact with Patient 02/08/15 [phone]      Chief Complaint  Patient presents with  . Back Pain  . Abdominal Pain  . Vaginal Discharge   HPI [name] 34 y.o. G0P0 @Unknown  presents for back pain with vaginal discharge.  The symptoms started 2 days ago and have not changed.  The discharge is white and thick.   The back pain is lower and bilateral.  It is 6/10.  Ibuprofen helps minimally.   She denies dysuria, vomiting, vaginal bleeding, fever.  Has had recent unprotected sex and 2 partners.  No contraception as she desires pregnancy. OB History    Gravida Para Term Preterm AB TAB SAB Ectopic Multiple Living   0         0      Past Medical History  Diagnosis Date  . Ovarian cyst   . Viral labyrinthitis   . DENTAL PAIN   . IRREGULAR MENSTRUATION   . FEMALE INFERTILITY   . BOILS, RECURRENT   . SEPTATE UTERUS   . Endometriosis   . Breast tenderness in female   . Gardnerella vaginalis infection   . Abscess   . BV (bacterial vaginosis)     Past Surgical History  Procedure Laterality Date  . Ovarian cyst removal  Mar 25 2010  . Biopsy of right breast    . Breast biopsy  1999    right  . Ovarian cyst removal  03/25/2010    right    Family History  Problem Relation Age of Onset  . Hypertension Mother   . Diabetes Mother   . Cancer Father   . Hypertension Father   . Diabetes Brother   . Hypertension Brother     Social History  Substance Use Topics  . Smoking status: Current Every Day Smoker -- 0.50 packs/day for 10 years    Types: Cigarettes  . Smokeless tobacco: Never Used  . Alcohol Use: Yes     Comment: occas.    Allergies: No Known Allergies  Prescriptions prior to admission  Medication Sig Dispense Refill Last Dose  . ibuprofen (ADVIL,MOTRIN) 200 MG tablet Take 400 mg by mouth every 6 (six) hours as needed.   02/07/2015 at Unknown time    ROS Pertinent ROS  in HPI.  All other systems are negative.   Physical Exam   Blood pressure 114/81, pulse 75, temperature 98.2 F (36.8 C), resp. rate 16, height 5\' 2"  (1.575 m), weight 175 lb (79.379 kg).  Physical Exam  Constitutional: She is oriented to person, place, and time. She appears well-developed and well-nourished. No distress.  HENT:  Head: Normocephalic and atraumatic.  Eyes: Conjunctivae and EOM are normal.  Neck: Normal range of motion. Neck supple.  Cardiovascular: Normal rate and normal heart sounds.   Respiratory: Effort normal and breath sounds normal. No respiratory distress.  GI: Soft. Bowel sounds are normal. She exhibits no distension and no mass. There is no tenderness. There is no rebound and no guarding.  Genitourinary:  Mod amt of homogenous white discharge.  Cervix is pink and smooth.  No CMT.  No adnexal mass or tenderness appreciated  Musculoskeletal: Normal range of motion.  Neurological: She is alert and oriented to person, place, and time.  Skin: Skin is warm and dry.  Psychiatric: She has a normal mood and affect. Her behavior is normal.   Results for orders placed  or performed during the hospital encounter of 02/08/15 (from the past 24 hour(s))  Urinalysis, Routine w reflex microscopic (not at [facility])     Status: None   Collection Time: 02/08/15  8:58 AM  Result Value Ref Range   Color, Urine YELLOW YELLOW   APPearance CLEAR CLEAR   Specific Gravity, Urine 1.025 1.005 - 1.030   pH 6.0 5.0 - 8.0   Glucose, UA NEGATIVE NEGATIVE mg/dL   Hgb urine dipstick NEGATIVE NEGATIVE   Bilirubin Urine NEGATIVE NEGATIVE   Ketones, ur NEGATIVE NEGATIVE mg/dL   Protein, ur NEGATIVE NEGATIVE mg/dL   Nitrite NEGATIVE NEGATIVE   Leukocytes, UA NEGATIVE NEGATIVE  Pregnancy, urine POC     Status: None   Collection Time: 02/08/15  9:02 AM  Result Value Ref Range   Preg Test, Ur NEGATIVE NEGATIVE  Wet prep, genital     Status: Abnormal   Collection Time: 02/08/15  9:25 AM  Result  Value Ref Range   Yeast Wet Prep HPF POC NONE SEEN NONE SEEN   Trich, Wet Prep NONE SEEN NONE SEEN   Clue Cells Wet Prep HPF POC FEW (A) NONE SEEN   WBC, Wet Prep HPF POC FEW (A) NONE SEEN   Sperm PRESENT     MAU Course  Procedures  MDM Wet prep, GC, HIV and RPR ordered.  U/A is negative.  Pregnancy test is negative.  Assessment and Plan  A: Bacterial vaginosis  P: Discharge to home Flagyl x 1 week No etoh/IC x 10 days Other labs pending Patient may return to MAU as needed or if her condition were to change or worsen   Paticia Stack 02/08/2015, 9:24 AM

## 2015-02-08 NOTE — MAU Note (Signed)
C/o lower abdominal and lower back pain with vaginal discharge for past 2 days; UPT negative;

## 2015-02-09 LAB — GC/CHLAMYDIA PROBE AMP (~~LOC~~) NOT AT ARMC
CHLAMYDIA, DNA PROBE: NEGATIVE
NEISSERIA GONORRHEA: NEGATIVE

## 2015-02-09 LAB — RPR: RPR: NONREACTIVE

## 2015-02-09 LAB — HIV ANTIBODY (ROUTINE TESTING W REFLEX): HIV SCREEN 4TH GENERATION: NONREACTIVE

## 2015-03-04 ENCOUNTER — Other Ambulatory Visit: Payer: Self-pay | Admitting: Obstetrics & Gynecology

## 2015-03-04 ENCOUNTER — Ambulatory Visit
Admission: RE | Admit: 2015-03-04 | Discharge: 2015-03-04 | Disposition: A | Discharge status: Home / Self Care | Payer: BLUE CROSS/BLUE SHIELD | Source: Ambulatory Visit | Attending: Obstetrics & Gynecology | Admitting: Obstetrics & Gynecology

## 2015-03-04 ENCOUNTER — Ambulatory Visit
Admission: RE | Admit: 2015-03-04 | Discharge: 2015-03-04 | Disposition: A | Discharge status: Home / Self Care | Payer: No Typology Code available for payment source | Source: Ambulatory Visit | Attending: Obstetrics & Gynecology | Admitting: Obstetrics & Gynecology

## 2015-03-04 DIAGNOSIS — N63 Unspecified lump in unspecified breast: Secondary | ICD-10-CM

## 2015-03-11 ENCOUNTER — Inpatient Hospital Stay (HOSPITAL_COMMUNITY)
Admission: AD | Admit: 2015-03-11 | Discharge: 2015-03-11 | Disposition: A | Discharge status: Home / Self Care | Payer: BLUE CROSS/BLUE SHIELD | Source: Ambulatory Visit | Attending: Family Medicine | Admitting: Family Medicine

## 2015-03-11 ENCOUNTER — Encounter (HOSPITAL_COMMUNITY): Payer: Self-pay

## 2015-03-11 DIAGNOSIS — B9689 Other specified bacterial agents as the cause of diseases classified elsewhere: Secondary | ICD-10-CM

## 2015-03-11 DIAGNOSIS — A499 Bacterial infection, unspecified: Secondary | ICD-10-CM | POA: Diagnosis not present

## 2015-03-11 DIAGNOSIS — F17210 Nicotine dependence, cigarettes, uncomplicated: Secondary | ICD-10-CM | POA: Insufficient documentation

## 2015-03-11 DIAGNOSIS — N760 Acute vaginitis: Secondary | ICD-10-CM | POA: Diagnosis not present

## 2015-03-11 DIAGNOSIS — N898 Other specified noninflammatory disorders of vagina: Secondary | ICD-10-CM | POA: Diagnosis present

## 2015-03-11 LAB — WET PREP, GENITAL
TRICH WET PREP: NONE SEEN
Yeast Wet Prep HPF POC: NONE SEEN

## 2015-03-11 LAB — URINALYSIS, ROUTINE W REFLEX MICROSCOPIC
Bilirubin Urine: NEGATIVE
GLUCOSE, UA: NEGATIVE mg/dL
HGB URINE DIPSTICK: NEGATIVE
KETONES UR: 15 mg/dL — AB
LEUKOCYTES UA: NEGATIVE
Nitrite: NEGATIVE
PH: 6.5 (ref 5.0–8.0)
PROTEIN: NEGATIVE mg/dL
Specific Gravity, Urine: 1.02 (ref 1.005–1.030)

## 2015-03-11 LAB — POCT PREGNANCY, URINE: Preg Test, Ur: NEGATIVE

## 2015-03-11 MED ORDER — METRONIDAZOLE 500 MG PO TABS: 500.0000 mg | ORAL_TABLET | Freq: Three times a day (TID) | ORAL | Status: DC

## 2015-03-11 NOTE — Discharge Instructions (Signed)

## 2015-03-11 NOTE — MAU Note (Signed)
Been having pain in lower back for past 4 days, constant.  Noted a d/c- thick, white , + odor- about a wk.   Pain in lower ribs about 5 days ago,Has had a cold- wasn't sure if it was from that or not.Marland Kitchen

## 2015-03-11 NOTE — MAU Provider Note (Signed)
  History     CSN: [id number]  Arrival date and time: 03/11/15 [id number]   None     Chief Complaint  Patient presents with  . Back Pain  . Vaginal Discharge  . Chest Pain   HPI Comments: [name] is a 35 yo female with no significant past medical history that presents today with complaint of flank pain, LLQ pain, and vaginal discharge. Patient states that these symptoms stared 2 ago and have gotten progressively worse. She describes the pain as a dull ache that is constant and rates her pain a 7/10. She has tried OTC NSAIDs without any relief. Nothing makes better or worse. She denies any N/V/D, fever, chills, cp, sob. She has recently recovered from an URI. She endorses dysuria but denies any urgency, frequency, or hematuria. She has had white, malodorous vaginal discharge. Patient had unprotected sexual intercourse with her "boyfriend" 4 days ago. Patient requesting STD and pregnancy testing.    OB History    Gravida Para Term Preterm AB TAB SAB Ectopic Multiple Living   0         0      Past Medical History  Diagnosis Date  . Ovarian cyst   . Viral labyrinthitis   . DENTAL PAIN   . IRREGULAR MENSTRUATION   . FEMALE INFERTILITY   . BOILS, RECURRENT   . SEPTATE UTERUS   . Endometriosis   . Breast tenderness in female   . Gardnerella vaginalis infection   . Abscess   . BV (bacterial vaginosis)     Past Surgical History  Procedure Laterality Date  . Ovarian cyst removal  Mar 25 2010  . Biopsy of right breast    . Breast biopsy  1999    right  . Ovarian cyst removal  03/25/2010    right    Family History  Problem Relation Age of Onset  . Hypertension Mother   . Diabetes Mother   . Cancer Father   . Hypertension Father   . Diabetes Brother   . Hypertension Brother     Social History  Substance Use Topics  . Smoking status: Current Every Day Smoker -- 0.50 packs/day for 10 years    Types: Cigarettes  . Smokeless tobacco: Never Used  . Alcohol Use: Yes   Comment: occas.    Allergies: No Known Allergies  Prescriptions prior to admission  Medication Sig Dispense Refill Last Dose  . ibuprofen (ADVIL,MOTRIN) 200 MG tablet Take 400 mg by mouth every 6 (six) hours as needed.   02/07/2015 at Unknown time  . metroNIDAZOLE (FLAGYL) 500 MG tablet Take 1 tablet (500 mg total) by mouth 2 (two) times daily. 14 tablet 0     Review of Systems  Constitutional: Negative for fever and chills.  Cardiovascular: Negative for chest pain and palpitations.  Gastrointestinal: Positive for abdominal pain (LLQ). Negative for nausea, vomiting, diarrhea, constipation and blood in stool.  Genitourinary: Positive for dysuria. Negative for urgency, frequency and hematuria. Flank pain: Left   Neurological: Negative for weakness.   Physical Exam   Blood pressure 110/83, pulse 80, temperature 98.3 F (36.8 C), temperature source Oral, resp. rate 18, height 5\' 1"  (1.549 m), weight 175 lb 12.8 oz (79.742 kg), last menstrual period 02/28/2015, SpO2 100 %.  Physical Exam  MAU Course  Procedures  BV   Assessment and Plan  Wet prep pos clue, GC and Chl done BV will treat with flagyl

## 2015-03-12 LAB — GC/CHLAMYDIA PROBE AMP (~~LOC~~) NOT AT ARMC
Chlamydia: NEGATIVE
Neisseria Gonorrhea: NEGATIVE

## 2015-04-06 ENCOUNTER — Inpatient Hospital Stay (HOSPITAL_COMMUNITY)
Admission: AD | Admit: 2015-04-06 | Discharge: 2015-04-06 | Disposition: A | Discharge status: Home / Self Care | Payer: BLUE CROSS/BLUE SHIELD | Source: Ambulatory Visit | Attending: Obstetrics & Gynecology | Admitting: Obstetrics & Gynecology

## 2015-04-06 ENCOUNTER — Encounter (HOSPITAL_COMMUNITY): Payer: Self-pay

## 2015-04-06 DIAGNOSIS — F17210 Nicotine dependence, cigarettes, uncomplicated: Secondary | ICD-10-CM | POA: Diagnosis not present

## 2015-04-06 DIAGNOSIS — Z79899 Other long term (current) drug therapy: Secondary | ICD-10-CM | POA: Diagnosis not present

## 2015-04-06 DIAGNOSIS — R1013 Epigastric pain: Secondary | ICD-10-CM | POA: Insufficient documentation

## 2015-04-06 DIAGNOSIS — N83209 Unspecified ovarian cyst, unspecified side: Secondary | ICD-10-CM | POA: Diagnosis not present

## 2015-04-06 DIAGNOSIS — Z9889 Other specified postprocedural states: Secondary | ICD-10-CM | POA: Diagnosis not present

## 2015-04-06 LAB — URINALYSIS, ROUTINE W REFLEX MICROSCOPIC
BILIRUBIN URINE: NEGATIVE
Glucose, UA: NEGATIVE mg/dL
HGB URINE DIPSTICK: NEGATIVE
KETONES UR: 15 mg/dL — AB
Leukocytes, UA: NEGATIVE
NITRITE: NEGATIVE
PROTEIN: NEGATIVE mg/dL
SPECIFIC GRAVITY, URINE: 1.015 (ref 1.005–1.030)
pH: 6.5 (ref 5.0–8.0)

## 2015-04-06 LAB — COMPREHENSIVE METABOLIC PANEL
ALBUMIN: 4.2 g/dL (ref 3.5–5.0)
ALT: 24 U/L (ref 14–54)
ANION GAP: 10 (ref 5–15)
AST: 27 U/L (ref 15–41)
Alkaline Phosphatase: 70 U/L (ref 38–126)
BUN: 8 mg/dL (ref 6–20)
CHLORIDE: 103 mmol/L (ref 101–111)
CO2: 28 mmol/L (ref 22–32)
Calcium: 9.3 mg/dL (ref 8.9–10.3)
Creatinine, Ser: 0.83 mg/dL (ref 0.44–1.00)
GFR calc Af Amer: >60 mL/min (ref >60)
GFR calc non Af Amer: >60 mL/min (ref >60)
GLUCOSE: 104 mg/dL — AB (ref 65–99)
POTASSIUM: 3.3 mmol/L — AB (ref 3.5–5.1)
SODIUM: 141 mmol/L (ref 135–145)
Total Bilirubin: 0.7 mg/dL (ref 0.3–1.2)
Total Protein: 7.2 g/dL (ref 6.5–8.1)

## 2015-04-06 LAB — CBC
HEMATOCRIT: 41.2 % (ref 36.0–46.0)
Hemoglobin: 14.1 g/dL (ref 12.0–15.0)
MCH: 28.7 pg (ref 26.0–34.0)
MCHC: 34.2 g/dL (ref 30.0–36.0)
MCV: 83.9 fL (ref 78.0–100.0)
PLATELETS: 343 10*3/uL (ref 150–400)
RBC: 4.91 MIL/uL (ref 3.87–5.11)
RDW: 14.9 % (ref 11.5–15.5)
WBC: 6 10*3/uL (ref 4.0–10.5)

## 2015-04-06 LAB — LIPASE, BLOOD: Lipase: 24 U/L (ref 11–51)

## 2015-04-06 LAB — AMYLASE: Amylase: 67 U/L (ref 28–100)

## 2015-04-06 LAB — POCT PREGNANCY, URINE: PREG TEST UR: NEGATIVE

## 2015-04-06 MED ORDER — PANTOPRAZOLE SODIUM 40 MG PO TBEC
40.0000 mg | DELAYED_RELEASE_TABLET | Freq: Once | ORAL | Status: AC
Start: 2015-04-06 — End: 2015-04-06
  Administered 2015-04-06: 40 mg via ORAL
  Filled 2015-04-06: qty 1

## 2015-04-06 MED ORDER — TRAMADOL HCL 50 MG PO TABS: 50.0000 mg | ORAL_TABLET | Freq: Four times a day (QID) | ORAL | Status: DC | PRN

## 2015-04-06 MED ORDER — PANTOPRAZOLE SODIUM 40 MG PO TBEC: 40.0000 mg | DELAYED_RELEASE_TABLET | Freq: Every day | ORAL | Status: DC

## 2015-04-06 NOTE — MAU Provider Note (Signed)
Chief Complaint: Abdominal Pain   First Provider Initiated Contact with Patient 04/06/15 1638     SUBJECTIVE HPI: [name] is a 35 y.o. G0P0 female who presents to [facility] reporting sharp upper abd pain x 4 days. No Hx similar Sx. No other eval of these Sx.   Location: epigastric  Quality: sharp Severity: 7/10 on pain scale Duration: 4 days Context: Worse after eating. No worse w/ any particular type of food. Timing: constant Modifying factors: No improvement w/ Zantac of Ibuprofen. Associated signs and symptoms: Pos for nausea. Neg for fever, chills, vomiting, diarrhea, constipation.   Past Medical History  Diagnosis Date  . Ovarian cyst   . Viral labyrinthitis   . DENTAL PAIN   . IRREGULAR MENSTRUATION   . FEMALE INFERTILITY   . BOILS, RECURRENT   . SEPTATE UTERUS   . Endometriosis   . Breast tenderness in female   . Gardnerella vaginalis infection   . Abscess   . BV (bacterial vaginosis)    OB History  Gravida Para Term Preterm AB SAB TAB Ectopic Multiple Living  0         0       Past Surgical History  Procedure Laterality Date  . Ovarian cyst removal  Mar 25 2010  . Biopsy of right breast    . Breast biopsy  1999    right  . Ovarian cyst removal  03/25/2010    right   Social History   Social History  . Marital Status: Single    Spouse Name: N/A  . Number of Children: N/A  . Years of Education: N/A   Occupational History  . Not on file.   Social History Main Topics  . Smoking status: Current Every Day Smoker -- 0.50 packs/day for 10 years    Types: Cigarettes  . Smokeless tobacco: Never Used  . Alcohol Use: Yes     Comment: occas.  . Drug Use: No  . Sexual Activity: Yes    Birth Control/ Protection: Condom   Other Topics Concern  . Not on file   Social History Narrative   No current facility-administered medications on file prior to encounter.   Current Outpatient Prescriptions on File Prior to Encounter  Medication Sig  Dispense Refill  . ibuprofen (ADVIL,MOTRIN) 200 MG tablet Take 400 mg by mouth every 6 (six) hours as needed for headache or mild pain.     . metroNIDAZOLE (FLAGYL) 500 MG tablet Take 1 tablet (500 mg total) by mouth 3 (three) times daily. (Patient not taking: Reported on 04/06/2015) 14 tablet 1  . [DISCONTINUED] Norgestimate-Ethinyl Estradiol Triphasic (TRI-SPRINTEC) 0.18/0.215/0.25 MG-35 MCG tablet Take 1 tablet by mouth daily. 1 Package 11   No Known Allergies  I have reviewed the past Medical Hx, Surgical Hx, Social Hx, Allergies and Medications.   Review of Systems  Constitutional: Negative for fever, chills and appetite change.  HENT: Negative for trouble swallowing.   Gastrointestinal: Positive for nausea and abdominal pain. Negative for vomiting, diarrhea, constipation and abdominal distention.  Musculoskeletal: Negative for back pain.    OBJECTIVE Patient Vitals for the past 24 hrs:  BP Temp Pulse Resp  04/06/15 1613 134/84 mmHg 98.8 F (37.1 C) 115 18   Constitutional: Well-developed, well-nourished female in no acute distress.  Psych: Angry.  Cardiovascular: Tachycardia Respiratory: normal rate and effort.  GI: Abd soft, non-tender. Pos BS x 4 Neurologic: Alert and oriented x 4.   LAB RESULTS Results for orders placed or performed  during the hospital encounter of 04/06/15 (from the past 24 hour(s))  Urinalysis, Routine w reflex microscopic (not at [facility])     Status: Abnormal   Collection Time: 04/06/15  4:10 PM  Result Value Ref Range   Color, Urine YELLOW YELLOW   APPearance CLEAR CLEAR   Specific Gravity, Urine 1.015 1.005 - 1.030   pH 6.5 5.0 - 8.0   Glucose, UA NEGATIVE NEGATIVE mg/dL   Hgb urine dipstick NEGATIVE NEGATIVE   Bilirubin Urine NEGATIVE NEGATIVE   Ketones, ur 15 (A) NEGATIVE mg/dL   Protein, ur NEGATIVE NEGATIVE mg/dL   Nitrite NEGATIVE NEGATIVE   Leukocytes, UA NEGATIVE NEGATIVE  Pregnancy, urine POC     Status: None   Collection Time: 04/06/15   4:12 PM  Result Value Ref Range   Preg Test, Ur NEGATIVE NEGATIVE  CBC     Status: None   Collection Time: 04/06/15  4:44 PM  Result Value Ref Range   WBC 6.0 4.0 - 10.5 K/uL   RBC 4.91 3.87 - 5.11 MIL/uL   Hemoglobin 14.1 12.0 - 15.0 g/dL   HCT 41.2 36.0 - 46.0 %   MCV 83.9 78.0 - 100.0 fL   MCH 28.7 26.0 - 34.0 pg   MCHC 34.2 30.0 - 36.0 g/dL   RDW 14.9 11.5 - 15.5 %   Platelets 343 150 - 400 K/uL  Comprehensive metabolic panel     Status: Abnormal   Collection Time: 04/06/15  4:44 PM  Result Value Ref Range   Sodium 141 135 - 145 mmol/L   Potassium 3.3 (L) 3.5 - 5.1 mmol/L   Chloride 103 101 - 111 mmol/L   CO2 28 22 - 32 mmol/L   Glucose, Bld 104 (H) 65 - 99 mg/dL   BUN 8 6 - 20 mg/dL   Creatinine, Ser 0.83 0.44 - 1.00 mg/dL   Calcium 9.3 8.9 - 10.3 mg/dL   Total Protein 7.2 6.5 - 8.1 g/dL   Albumin 4.2 3.5 - 5.0 g/dL   AST 27 15 - 41 U/L   ALT 24 14 - 54 U/L   Alkaline Phosphatase 70 38 - 126 U/L   Total Bilirubin 0.7 0.3 - 1.2 mg/dL   GFR calc non Af Amer >60 >60 mL/min   GFR calc Af Amer >60 >60 mL/min   Anion gap 10 5 - 15  Lipase, blood     Status: None   Collection Time: 04/06/15  4:44 PM  Result Value Ref Range   Lipase 24 11 - 51 U/L  Amylase     Status: None   Collection Time: 04/06/15  4:44 PM  Result Value Ref Range   Amylase 67 28 - 100 U/L    IMAGING No results found.  MAU COURSE UA, UPT, CBC, CMET, Amylase, Lipase, Protonix.   Discussed normal labs and no evidence of emergent condition with patient and offered trial of proton pump inhibitor versus transfer to [facility] ED since this is not an OB/GYN problem. Patient was repeatedly asking for an x-ray. Midwife stated that there was not any indication for one this time and offered transfer to ED for further evaluation if patient believed in emergency was present. Patient declined and wants to try changing reflux medicine.  MDM 35 year old non-pregnant female with four-day history of epigastric pain.  Labs normal. Suspect reflux. Patient non-toxic-appearing. Doubt emergent condition.  ASSESSMENT 1. Acute epigastric pain     PLAN Discharge home in stable condition. Abdominal pain Precautions. Reflux diet. Stop ibuprofen.  Proton pump inhibitor may take one week or more to take full effect.     Follow-up Information    Follow up with [facility].   Why:  As needed if symptoms worsen and for routine primary care   Contact information:   [address] [place] [id number]      Follow up with [facility].   Specialty:  Emergency Medicine   Why:  As needed in emergencies (fever greater than 100.4, severe pain that does not improve with medications)   Contact information:   [address] [id number] [place] [phone]       Medication List    STOP taking these medications        ibuprofen 200 MG tablet  Commonly known as:  ADVIL,MOTRIN     metroNIDAZOLE 500 MG tablet  Commonly known as:  FLAGYL      TAKE these medications        pantoprazole 40 MG tablet  Commonly known as:  PROTONIX  Take 1 tablet (40 mg total) by mouth daily.     traMADol 50 MG tablet  Commonly known as:  ULTRAM  Take 1-2 tablets (50-100 mg total) by mouth every 6 (six) hours as needed for severe pain.        [place], [place] 04/06/2015  6:37 PM

## 2015-04-06 NOTE — MAU Note (Signed)
Patient received medication did not want to wait for 20 minutes said had to leave, encouraged patient to get prescriptions filled and follow up with [facility] or [facility] or if symptoms worsen go to [facility] ED or [facility] ED, patient verbalized an understanding.

## 2015-04-06 NOTE — Discharge Instructions (Signed)
Abdominal Pain, Adult Many things can cause abdominal pain. Usually, abdominal pain is not caused by a disease and will improve without treatment. It can often be observed and treated at home. Your health care provider will do a physical exam and possibly order blood tests and X-rays to help determine the seriousness of your pain. However, in many cases, more time must pass before a clear cause of the pain can be found. Before that point, your health care provider may not know if you need more testing or further treatment. HOME CARE INSTRUCTIONS Monitor your abdominal pain for any changes. The following actions may help to alleviate any discomfort you are experiencing:  Only take over-the-counter or prescription medicines as directed by your health care provider.  Do not take laxatives unless directed to do so by your health care provider.  Try a clear liquid diet (broth, tea, or water) as directed by your health care provider. Slowly move to a bland diet as tolerated. SEEK MEDICAL CARE IF:  You have unexplained abdominal pain.  You have abdominal pain associated with nausea or diarrhea.  You have pain when you urinate or have a bowel movement.  You experience abdominal pain that wakes you in the night.  You have abdominal pain that is worsened or improved by eating food.  You have abdominal pain that is worsened with eating fatty foods.  You have a fever. SEEK IMMEDIATE MEDICAL CARE IF:  Your pain does improve after one week of taking your reflux medicine daily.  You keep throwing up (vomiting).  Your pain is felt only in portions of the abdomen, such as the right side or the left lower portion of the abdomen.  You pass bloody or black tarry stools. MAKE SURE YOU:  Understand these instructions.  Will watch your condition.  Will get help right away if you are not doing well or get worse.   This information is not intended to replace advice given to you by your health care  provider. Make sure you discuss any questions you have with your health care provider.   Document Released: 11/05/2004 Document Revised: 10/17/2014 Document Reviewed: 10/05/2012 Elsevier Interactive Patient Education 2016 [place].  Gastroesophageal Reflux Disease, Adult Normally, food travels down the esophagus and stays in the stomach to be digested. However, when a person has gastroesophageal reflux disease (GERD), food and stomach acid move back up into the esophagus. When this happens, the esophagus becomes sore and inflamed. Over time, GERD can create small holes (ulcers) in the lining of the esophagus.  CAUSES This condition is caused by a problem with the muscle between the esophagus and the stomach (lower esophageal sphincter, or LES). Normally, the LES muscle closes after food passes through the esophagus to the stomach. When the LES is weakened or abnormal, it does not close properly, and that allows food and stomach acid to go back up into the esophagus. The LES can be weakened by certain dietary substances, medicines, and medical conditions, including:  Tobacco use.  Pregnancy.  Having a hiatal hernia.  Heavy alcohol use.  Certain foods and beverages, such as coffee, chocolate, onions, and peppermint. RISK FACTORS This condition is more likely to develop in:  People who have an increased body weight.  People who have connective tissue disorders.  People who use NSAID medicines. SYMPTOMS Symptoms of this condition include:  Heartburn.  Difficult or painful swallowing.  The feeling of having a lump in the throat.  Abitter taste in the mouth.  Bad breath.  Having a large amount of saliva.  Having an upset or bloated stomach.  Belching.  Chest pain.  Shortness of breath or wheezing.  Ongoing (chronic) cough or a night-time cough.  Wearing away of tooth enamel.  Weight loss. Different conditions can cause chest pain. Make sure to see your health  care provider if you experience chest pain. DIAGNOSIS Your health care provider will take a medical history and perform a physical exam. To determine if you have mild or severe GERD, your health care provider may also monitor how you respond to treatment. You may also have other tests, including:  An endoscopy toexamine your stomach and esophagus with a small camera.  A test thatmeasures the acidity level in your esophagus.  A test thatmeasures how much pressure is on your esophagus.  A barium swallow or modified barium swallow to show the shape, size, and functioning of your esophagus. TREATMENT The goal of treatment is to help relieve your symptoms and to prevent complications. Treatment for this condition may vary depending on how severe your symptoms are. Your health care provider may recommend:  Changes to your diet.  Medicine.  Surgery. HOME CARE INSTRUCTIONS Diet  Follow a diet as recommended by your health care provider. This may involve avoiding foods and drinks such as:  Coffee and tea (with or without caffeine).  Drinks that containalcohol.  Energy drinks and sports drinks.  Carbonated drinks or sodas.  Chocolate and cocoa.  Peppermint and mint flavorings.  Garlic and onions.  Horseradish.  Spicy and acidic foods, including peppers, chili powder, curry powder, vinegar, hot sauces, and barbecue sauce.  Citrus fruit juices and citrus fruits, such as oranges, lemons, and limes.  Tomato-based foods, such as red sauce, chili, salsa, and pizza with red sauce.  Fried and fatty foods, such as donuts, french fries, potato chips, and high-fat dressings.  High-fat meats, such as hot dogs and fatty cuts of red and white meats, such as rib eye steak, sausage, ham, and bacon.  High-fat dairy items, such as whole milk, butter, and cream cheese.  Eat small, frequent meals instead of large meals.  Avoid drinking large amounts of liquid with your meals.  Avoid  eating meals during the 2-3 hours before bedtime.  Avoid lying down right after you eat.  Do not exercise right after you eat. General Instructions  Pay attention to any changes in your symptoms.  Take over-the-counter and prescription medicines only as told by your health care provider. Do not take aspirin, ibuprofen, or other NSAIDs unless your health care provider told you to do so.  Do not use any tobacco products, including cigarettes, chewing tobacco, and e-cigarettes. If you need help quitting, ask your health care provider.  Wear loose-fitting clothing. Do not wear anything tight around your waist that causes pressure on your abdomen.  Raise (elevate) the head of your bed 6 inches (15cm).  Try to reduce your stress, such as with yoga or meditation. If you need help reducing stress, ask your health care provider.  If you are overweight, reduce your weight to an amount that is healthy for you. Ask your health care provider for guidance about a safe weight loss goal.  Keep all follow-up visits as told by your health care provider. This is important. SEEK MEDICAL CARE IF:  You have new symptoms.  You have unexplained weight loss.  You have difficulty swallowing, or it hurts to swallow.  You have wheezing or a persistent cough.  Your symptoms do not improve with treatment.  You have a hoarse voice. SEEK IMMEDIATE MEDICAL CARE IF:  You have pain in your arms, neck, jaw, teeth, or back.  You feel sweaty, dizzy, or light-headed.  You have chest pain or shortness of breath.  You vomit and your vomit looks like blood or coffee grounds.  You faint.  Your stool is bloody or black.  You cannot swallow, drink, or eat.   This information is not intended to replace advice given to you by your health care provider. Make sure you discuss any questions you have with your health care provider.   Document Released: 11/05/2004 Document Revised: 10/17/2014 Document Reviewed:  05/23/2014 Elsevier Interactive Patient Education 2016 [place] for Gastroesophageal Reflux Disease, Adult When you have gastroesophageal reflux disease (GERD), the foods you eat and your eating habits are very important. Choosing the right foods can help ease the discomfort of GERD. WHAT GENERAL GUIDELINES DO I NEED TO FOLLOW?  Choose fruits, vegetables, whole grains, low-fat dairy products, and low-fat meat, fish, and poultry.  Limit fats such as oils, salad dressings, butter, nuts, and avocado.  Keep a food diary to identify foods that cause symptoms.  Avoid foods that cause reflux. These may be different for different people.  Eat frequent small meals instead of three large meals each day.  Eat your meals slowly, in a relaxed setting.  Limit fried foods.  Cook foods using methods other than frying.  Avoid drinking alcohol.  Avoid drinking large amounts of liquids with your meals.  Avoid bending over or lying down until 2-3 hours after eating. WHAT FOODS ARE NOT RECOMMENDED? The following are some foods and drinks that may worsen your symptoms: Vegetables Tomatoes. Tomato juice. Tomato and spaghetti sauce. Chili peppers. Onion and garlic. Horseradish. Fruits Oranges, grapefruit, and lemon (fruit and juice). Meats High-fat meats, fish, and poultry. This includes hot dogs, ribs, ham, sausage, salami, and bacon. Dairy Whole milk and chocolate milk. Sour cream. Cream. Butter. Ice cream. Cream cheese.  Beverages Coffee and tea, with or without caffeine. Carbonated beverages or energy drinks. Condiments Hot sauce. Barbecue sauce.  Sweets/Desserts Chocolate and cocoa. Donuts. Peppermint and spearmint. Fats and Oils High-fat foods, including [place] fries and potato chips. Other Vinegar. Strong spices, such as black pepper, white pepper, red pepper, cayenne, curry powder, cloves, ginger, and chili powder. The items listed above may not be a complete  list of foods and beverages to avoid. Contact your dietitian for more information.   This information is not intended to replace advice given to you by your health care provider. Make sure you discuss any questions you have with your health care provider.   Document Released: 01/26/2005 Document Revised: 02/16/2014 Document Reviewed: 11/30/2012 Elsevier Interactive Patient Education [employer].

## 2015-04-06 NOTE — MAU Note (Signed)
Patient presents to MAU with complaint of epigastric pain for 5 days, feels likes going to throw up with eats, has been taking Zantac.  [name] 03/24/15

## 2015-04-17 ENCOUNTER — Encounter (HOSPITAL_COMMUNITY): Payer: Self-pay | Admitting: Emergency Medicine

## 2015-04-17 ENCOUNTER — Emergency Department (INDEPENDENT_AMBULATORY_CARE_PROVIDER_SITE_OTHER): Payer: BLUE CROSS/BLUE SHIELD

## 2015-04-17 ENCOUNTER — Emergency Department (INDEPENDENT_AMBULATORY_CARE_PROVIDER_SITE_OTHER)
Admission: EM | Admit: 2015-04-17 | Discharge: 2015-04-17 | Disposition: A | Discharge status: Home / Self Care | Payer: BLUE CROSS/BLUE SHIELD | Source: Home / Self Care | Attending: Family Medicine | Admitting: Family Medicine

## 2015-04-17 DIAGNOSIS — R0789 Other chest pain: Secondary | ICD-10-CM | POA: Diagnosis not present

## 2015-04-17 MED ORDER — MELOXICAM 7.5 MG PO TABS: 7.5000 mg | ORAL_TABLET | Freq: Two times a day (BID) | ORAL | Status: DC

## 2015-04-17 NOTE — ED Provider Notes (Signed)
CSN: [id number]     Arrival date & time 04/17/15  1536 History   First MD Initiated Contact with Patient 04/17/15 1613     Chief Complaint  Patient presents with  . Chest Pain   (Consider location/radiation/quality/duration/timing/severity/associated sxs/prior Treatment) Patient is a 35 y.o. female presenting with chest pain. The history is provided by the patient.  Chest Pain Pain location:  L lateral chest and R lateral chest Pain quality: aching   Pain radiates to:  Does not radiate Pain radiates to the back: no   Pain severity:  Mild Onset quality:  Gradual Progression:  Unchanged Chronicity:  New Context comment:  Also assoc bilat breast pain, recent mammo, is a smoker. Associated symptoms: no palpitations     Past Medical History  Diagnosis Date  . Ovarian cyst   . Viral labyrinthitis   . DENTAL PAIN   . IRREGULAR MENSTRUATION   . FEMALE INFERTILITY   . BOILS, RECURRENT   . SEPTATE UTERUS   . Endometriosis   . Breast tenderness in female   . Gardnerella vaginalis infection   . Abscess   . BV (bacterial vaginosis)    Past Surgical History  Procedure Laterality Date  . Ovarian cyst removal  Mar 25 2010  . Biopsy of right breast    . Breast biopsy  1999    right  . Ovarian cyst removal  03/25/2010    right   Family History  Problem Relation Age of Onset  . Hypertension Mother   . Diabetes Mother   . Cancer Father   . Hypertension Father   . Diabetes Brother   . Hypertension Brother    Social History  Substance Use Topics  . Smoking status: Current Every Day Smoker -- 0.50 packs/day for 10 years    Types: Cigarettes  . Smokeless tobacco: Never Used  . Alcohol Use: Yes     Comment: occas.   OB History    Gravida Para Term Preterm AB TAB SAB Ectopic Multiple Living   0         0     Review of Systems  Constitutional: Negative.   Respiratory: Negative.   Cardiovascular: Positive for chest pain. Negative for palpitations and leg swelling.   Gastrointestinal: Negative.   All other systems reviewed and are negative.   Allergies  Review of patient's allergies indicates no known allergies.  Home Medications   Prior to Admission medications   Medication Sig Start Date End Date Taking? Authorizing Provider  meloxicam (MOBIC) 7.5 MG tablet Take 1 tablet (7.5 mg total) by mouth 2 (two) times daily after a meal. 04/17/15   Billy Fischer, MD  pantoprazole (PROTONIX) 40 MG tablet Take 1 tablet (40 mg total) by mouth daily. 04/06/15   Manya Silvas, CNM  traMADol (ULTRAM) 50 MG tablet Take 1-2 tablets (50-100 mg total) by mouth every 6 (six) hours as needed for severe pain. 04/06/15   Manya Silvas, CNM   Meds Ordered and Administered this Visit  Medications - No data to display  BP 119/67 mmHg  Pulse 88  Temp(Src) 98.4 F (36.9 C) (Oral)  Resp 16  Ht 5\' 2"  (1.575 m)  Wt 170 lb (77.111 kg)  BMI 31.09 kg/m2  SpO2 98%  [name] 03/24/2015 No data found.   Physical Exam  Constitutional: She is oriented to person, place, and time. She appears well-developed and well-nourished. No distress.  Neck: Normal range of motion. Neck supple.  Cardiovascular: Normal rate, regular rhythm, normal  heart sounds and intact distal pulses.   Pulmonary/Chest: Effort normal and breath sounds normal. No respiratory distress. She has no wheezes. She has no rales. She exhibits tenderness.  Lymphadenopathy:    She has no cervical adenopathy.  Neurological: She is alert and oriented to person, place, and time.  Skin: Skin is warm and dry.  Nursing note and vitals reviewed.   ED Course  Procedures (including critical care time)  Labs Review Labs Reviewed - No data to display  Imaging Review No results found. X-rays reviewed and report per radiologist.   Visual Acuity Review  Right Eye Distance:   Left Eye Distance:   Bilateral Distance:    Right Eye Near:   Left Eye Near:    Bilateral Near:     ED ECG REPORT   Date: 04/17/2015   Rate: 73  Rhythm: normal sinus rhythm  QRS Axis: normal  Intervals: normal  ST/T Wave abnormalities: normal  Conduction Disutrbances:none  Narrative Interpretation:   Old EKG Reviewed: none available  I have personally reviewed the EKG tracing and agree with the computerized printout as noted.     MDM   1. Left-sided chest wall pain        Billy Fischer, MD 04/26/15 2131

## 2015-04-17 NOTE — ED Notes (Signed)
PT reports chest pain that started 4 days ago . PT was at rest at onset of pain. Chest pain has been intermittent. PT reports slight intermittent SOB. PT reports pain is aching. Pain is in center chest. No radiation. Pain 8/10 at this time

## 2015-04-17 NOTE — Discharge Instructions (Signed)
Use medicine as needed, see your doctor if further problems. °

## 2015-05-02 ENCOUNTER — Encounter (HOSPITAL_COMMUNITY): Payer: Self-pay | Admitting: *Deleted

## 2015-05-02 ENCOUNTER — Inpatient Hospital Stay (HOSPITAL_COMMUNITY)
Admission: AD | Admit: 2015-05-02 | Discharge: 2015-05-02 | Disposition: A | Discharge status: Home / Self Care | Payer: BLUE CROSS/BLUE SHIELD | Source: Ambulatory Visit | Attending: Obstetrics and Gynecology | Admitting: Obstetrics and Gynecology

## 2015-05-02 DIAGNOSIS — R300 Dysuria: Secondary | ICD-10-CM | POA: Diagnosis present

## 2015-05-02 DIAGNOSIS — F17210 Nicotine dependence, cigarettes, uncomplicated: Secondary | ICD-10-CM | POA: Diagnosis not present

## 2015-05-02 DIAGNOSIS — Z8249 Family history of ischemic heart disease and other diseases of the circulatory system: Secondary | ICD-10-CM | POA: Diagnosis not present

## 2015-05-02 DIAGNOSIS — B9689 Other specified bacterial agents as the cause of diseases classified elsewhere: Secondary | ICD-10-CM | POA: Diagnosis not present

## 2015-05-02 DIAGNOSIS — A499 Bacterial infection, unspecified: Secondary | ICD-10-CM | POA: Diagnosis not present

## 2015-05-02 DIAGNOSIS — Z833 Family history of diabetes mellitus: Secondary | ICD-10-CM | POA: Insufficient documentation

## 2015-05-02 DIAGNOSIS — N760 Acute vaginitis: Secondary | ICD-10-CM

## 2015-05-02 DIAGNOSIS — N390 Urinary tract infection, site not specified: Secondary | ICD-10-CM | POA: Insufficient documentation

## 2015-05-02 DIAGNOSIS — N3000 Acute cystitis without hematuria: Secondary | ICD-10-CM

## 2015-05-02 LAB — POCT PREGNANCY, URINE: PREG TEST UR: NEGATIVE

## 2015-05-02 LAB — URINE MICROSCOPIC-ADD ON

## 2015-05-02 LAB — URINALYSIS, ROUTINE W REFLEX MICROSCOPIC
Bilirubin Urine: NEGATIVE
Glucose, UA: NEGATIVE mg/dL
Ketones, ur: NEGATIVE mg/dL
NITRITE: NEGATIVE
PH: 6.5 (ref 5.0–8.0)
Protein, ur: 100 mg/dL — AB
SPECIFIC GRAVITY, URINE: 1.025 (ref 1.005–1.030)

## 2015-05-02 LAB — WET PREP, GENITAL
Trich, Wet Prep: NONE SEEN
YEAST WET PREP: NONE SEEN

## 2015-05-02 MED ORDER — CLINDAMYCIN HCL 300 MG PO CAPS: 300.0000 mg | ORAL_CAPSULE | Freq: Two times a day (BID) | ORAL | Status: DC

## 2015-05-02 MED ORDER — CIPROFLOXACIN HCL 250 MG PO TABS: 250.0000 mg | ORAL_TABLET | Freq: Two times a day (BID) | ORAL | Status: DC

## 2015-05-02 NOTE — MAU Provider Note (Signed)
History     CSN: [id number]  Arrival date and time: 05/02/15 1515   First Provider Initiated Contact with Patient 05/02/15 1534      Chief Complaint  Patient presents with  . Urinary Tract Infection   HPI [name] is a 35 y.o. G0P0 who presents to MAU today with complaint of burning with urination and continued vaginal discharge. The patient was diagnosed with BV recently and given Flagyl. She states that Flagyl makes her sick and wants her Rx switched to Clindamycin. She also states ~ 3 days of dysuria with some increased frequency or urination. She denies hematuria, urgency, fever or flank pain.   OB History    Gravida Para Term Preterm AB TAB SAB Ectopic Multiple Living   0         0      Past Medical History  Diagnosis Date  . Ovarian cyst   . Viral labyrinthitis   . DENTAL PAIN   . IRREGULAR MENSTRUATION   . FEMALE INFERTILITY   . BOILS, RECURRENT   . SEPTATE UTERUS   . Endometriosis   . Breast tenderness in female   . Gardnerella vaginalis infection   . Abscess   . BV (bacterial vaginosis)     Past Surgical History  Procedure Laterality Date  . Ovarian cyst removal  Mar 25 2010  . Biopsy of right breast    . Breast biopsy  1999    right  . Ovarian cyst removal  03/25/2010    right    Family History  Problem Relation Age of Onset  . Hypertension Mother   . Diabetes Mother   . Cancer Father   . Hypertension Father   . Diabetes Brother   . Hypertension Brother     Social History  Substance Use Topics  . Smoking status: Current Every Day Smoker -- 0.50 packs/day for 10 years    Types: Cigarettes  . Smokeless tobacco: Never Used  . Alcohol Use: Yes     Comment: occas.    Allergies: No Known Allergies  No prescriptions prior to admission    Review of Systems  Constitutional: Negative for fever and malaise/fatigue.  Gastrointestinal: Negative for nausea, vomiting, abdominal pain, diarrhea and constipation.  Genitourinary: Positive  for dysuria and frequency. Negative for urgency, hematuria and flank pain.       Neg -vaginal bleeding + vaginal discharge   Physical Exam   Blood pressure 127/78, pulse 108, temperature 97.8 F (36.6 C), resp. rate 18, last menstrual period 03/24/2015.  Physical Exam  Nursing note and vitals reviewed. Constitutional: She is oriented to person, place, and time. She appears well-developed and well-nourished. No distress.  HENT:  Head: Normocephalic and atraumatic.  Cardiovascular: Normal rate.   Respiratory: Effort normal.  GI: Soft. She exhibits no distension and no mass. There is no tenderness. There is no rebound and no guarding.  Genitourinary: Uterus is not enlarged and not tender. Cervix exhibits no motion tenderness, no discharge and no friability. Right adnexum displays no mass and no tenderness. Left adnexum displays no mass and no tenderness. No bleeding in the vagina. Vaginal discharge (small amount of thin, white discharge noted) found.  Neurological: She is alert and oriented to person, place, and time.  Skin: Skin is warm and dry. No erythema.  Psychiatric: She has a normal mood and affect.    Results for orders placed or performed during the hospital encounter of 05/02/15 (from the past 24 hour(s))  Urinalysis,  Routine w reflex microscopic (not at [facility])     Status: Abnormal   Collection Time: 05/02/15  1:20 PM  Result Value Ref Range   Color, Urine YELLOW YELLOW   APPearance CLOUDY (A) CLEAR   Specific Gravity, Urine 1.025 1.005 - 1.030   pH 6.5 5.0 - 8.0   Glucose, UA NEGATIVE NEGATIVE mg/dL   Hgb urine dipstick LARGE (A) NEGATIVE   Bilirubin Urine NEGATIVE NEGATIVE   Ketones, ur NEGATIVE NEGATIVE mg/dL   Protein, ur 100 (A) NEGATIVE mg/dL   Nitrite NEGATIVE NEGATIVE   Leukocytes, UA LARGE (A) NEGATIVE  Urine microscopic-add on     Status: Abnormal   Collection Time: 05/02/15  1:20 PM  Result Value Ref Range   Squamous Epithelial / LPF 0-5 (A) NONE SEEN   WBC,  UA TOO NUMEROUS TO COUNT 0 - 5 WBC/hpf   RBC / HPF 6-30 0 - 5 RBC/hpf   Bacteria, UA MANY (A) NONE SEEN  Pregnancy, urine POC     Status: None   Collection Time: 05/02/15  3:34 PM  Result Value Ref Range   Preg Test, Ur NEGATIVE NEGATIVE  Wet prep, genital     Status: Abnormal   Collection Time: 05/02/15  3:40 PM  Result Value Ref Range   Yeast Wet Prep HPF POC NONE SEEN NONE SEEN   Trich, Wet Prep NONE SEEN NONE SEEN   Clue Cells Wet Prep HPF POC PRESENT (A) NONE SEEN   WBC, Wet Prep HPF POC MODERATE (A) NONE SEEN   Sperm PRESENT     MAU Course  Procedures None  MDM UPT - negative UA today Urine culture pending Assessment and Plan  A: UTI Bacterial Vaginosis  P: Discharge home Rx for Cipro and Clindamycin sent to patient's pharmacy/given to patient Warning signs for worsening condition discussed Patient advised to follow-up with PCP of choice as needed if symptoms persist or worsen Patient may return to MAU as needed or if her condition were to change or worsen   Luvenia Redden, PA-C  05/02/2015, 5:13 PM

## 2015-05-02 NOTE — MAU Note (Signed)
Pt c/o burning with urination x 3 days. Also c/o back pain.

## 2015-05-02 NOTE — Discharge Instructions (Signed)
Bacterial Vaginosis Bacterial vaginosis is an infection of the vagina. It happens when too many germs (bacteria) grow in the vagina. Having this infection puts you at risk for getting other infections from sex. Treating this infection can help lower your risk for other infections, such as:   Chlamydia.  Gonorrhea.  HIV.  Herpes. HOME CARE  Take your medicine as told by your doctor.  Finish your medicine even if you start to feel better.  Tell your sex partner that you have an infection. They should see their doctor for treatment.  During treatment:  Avoid sex or use condoms correctly.  Do not douche.  Do not drink alcohol unless your doctor tells you it is ok.  Do not breastfeed unless your doctor tells you it is ok. GET HELP IF:  You are not getting better after 3 days of treatment.  You have more grey fluid (discharge) coming from your vagina than before.  You have more pain than before.  You have a fever. MAKE SURE YOU:   Understand these instructions.  Will watch your condition.  Will get help right away if you are not doing well or get worse.   This information is not intended to replace advice given to you by your health care provider. Make sure you discuss any questions you have with your health care provider.   Document Released: 11/05/2007 Document Revised: 02/16/2014 Document Reviewed: 09/07/2012 Elsevier Interactive Patient Education 2016 [employer] Urinary Tract Infection A urinary tract infection (UTI) can occur any place along the urinary tract. The tract includes the kidneys, ureters, bladder, and urethra. A type of germ called bacteria often causes a UTI. UTIs are often helped with antibiotic medicine.  HOME CARE   If given, take antibiotics as told by your doctor. Finish them even if you start to feel better.  Drink enough fluids to keep your pee (urine) clear or pale yellow.  Avoid tea, drinks with caffeine, and bubbly (carbonated)  drinks.  Pee often. Avoid holding your pee in for a long time.  Pee before and after having sex (intercourse).  Wipe from front to back after you poop (bowel movement) if you are a woman. Use each tissue only once. GET HELP RIGHT AWAY IF:   You have back pain.  You have lower belly (abdominal) pain.  You have chills.  You feel sick to your stomach (nauseous).  You throw up (vomit).  Your burning or discomfort with peeing does not go away.  You have a fever.  Your symptoms are not better in 3 days. MAKE SURE YOU:   Understand these instructions.  Will watch your condition.  Will get help right away if you are not doing well or get worse.   This information is not intended to replace advice given to you by your health care provider. Make sure you discuss any questions you have with your health care provider.   Document Released: 07/15/2007 Document Revised: 02/16/2014 Document Reviewed: 08/27/2011 Elsevier Interactive Patient Education [employer].

## 2015-05-04 LAB — URINE CULTURE

## 2015-07-17 ENCOUNTER — Other Ambulatory Visit: Payer: Self-pay | Admitting: Obstetrics & Gynecology

## 2015-07-17 DIAGNOSIS — N6489 Other specified disorders of breast: Secondary | ICD-10-CM

## 2015-08-13 IMAGING — CR DG CHEST 2V
2 series · 2 of 2 positions shown · non-contrast
Comparison: 12/24/2013

CLINICAL DATA: Acute anterior chest pain

EXAM:
CHEST  2 VIEW

[w chest pa]
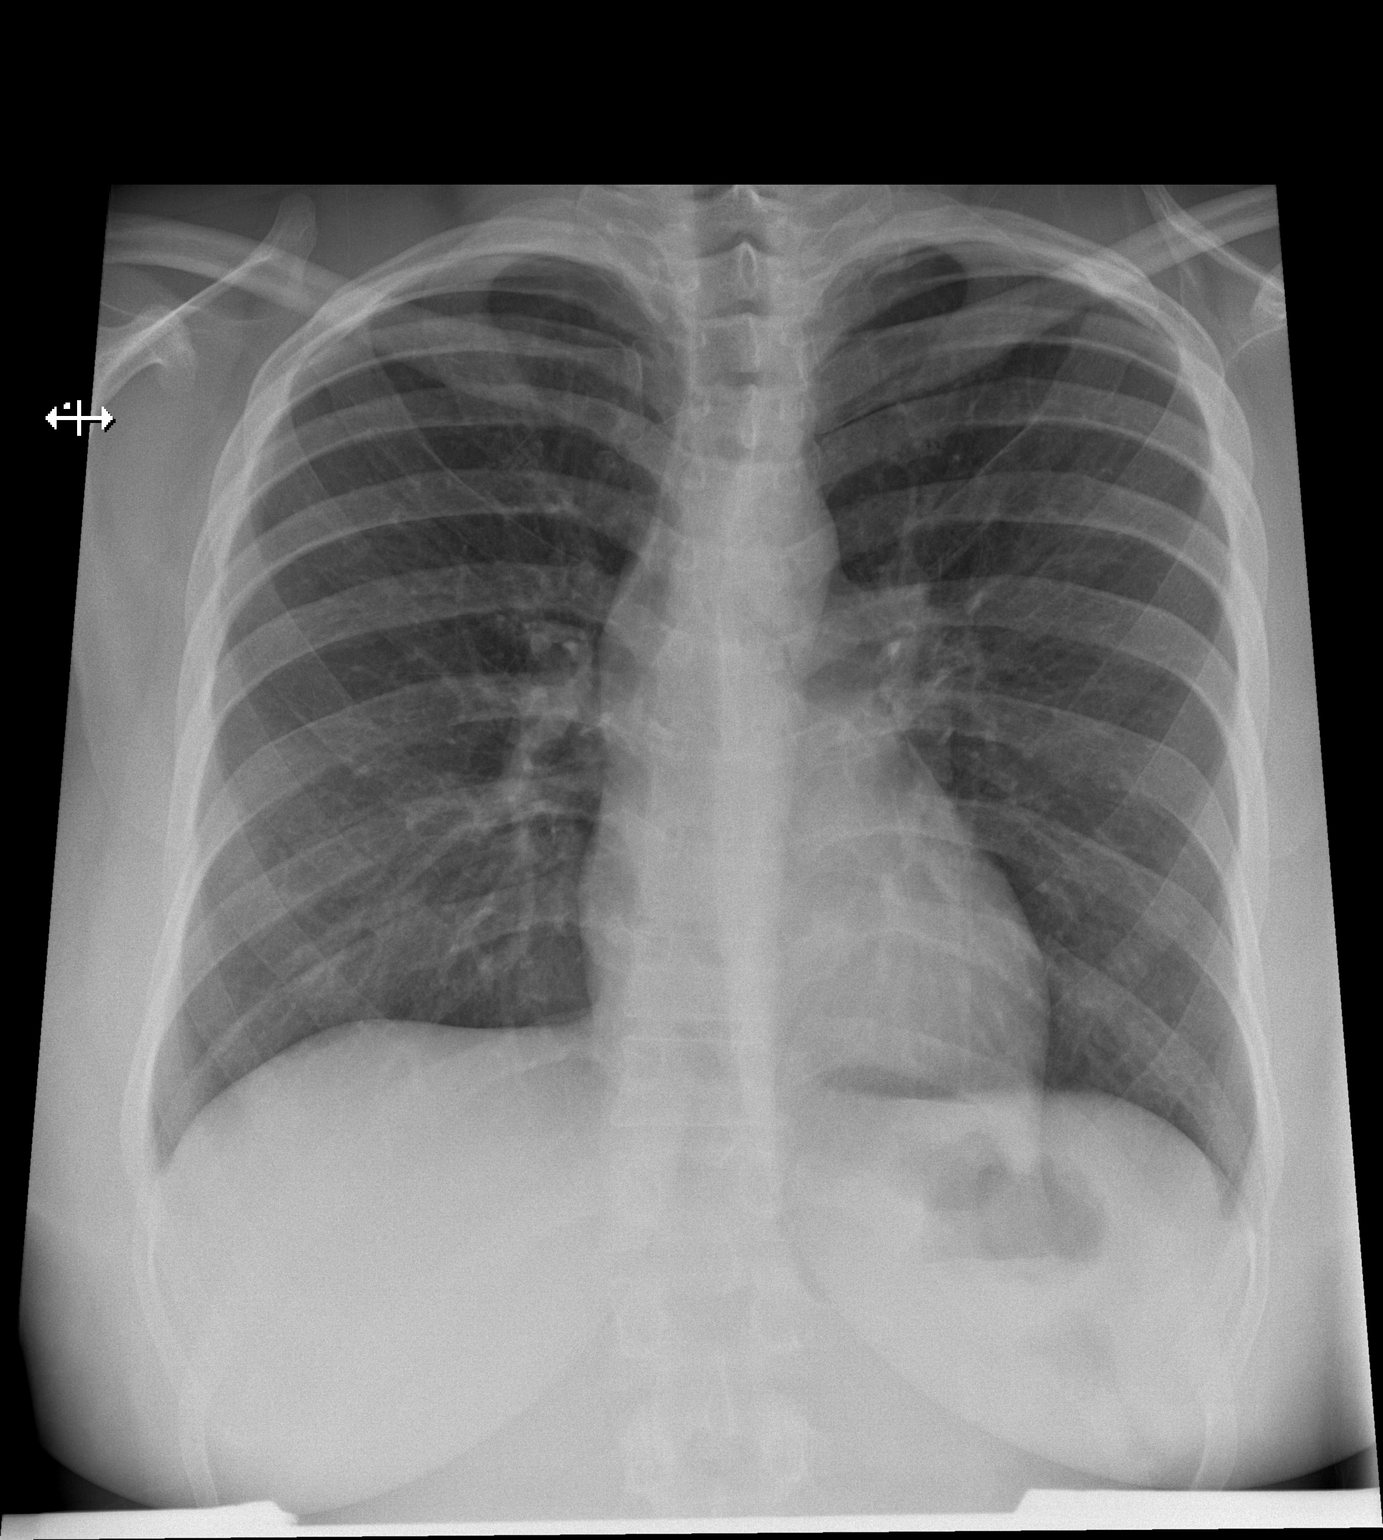

[w chest lat]
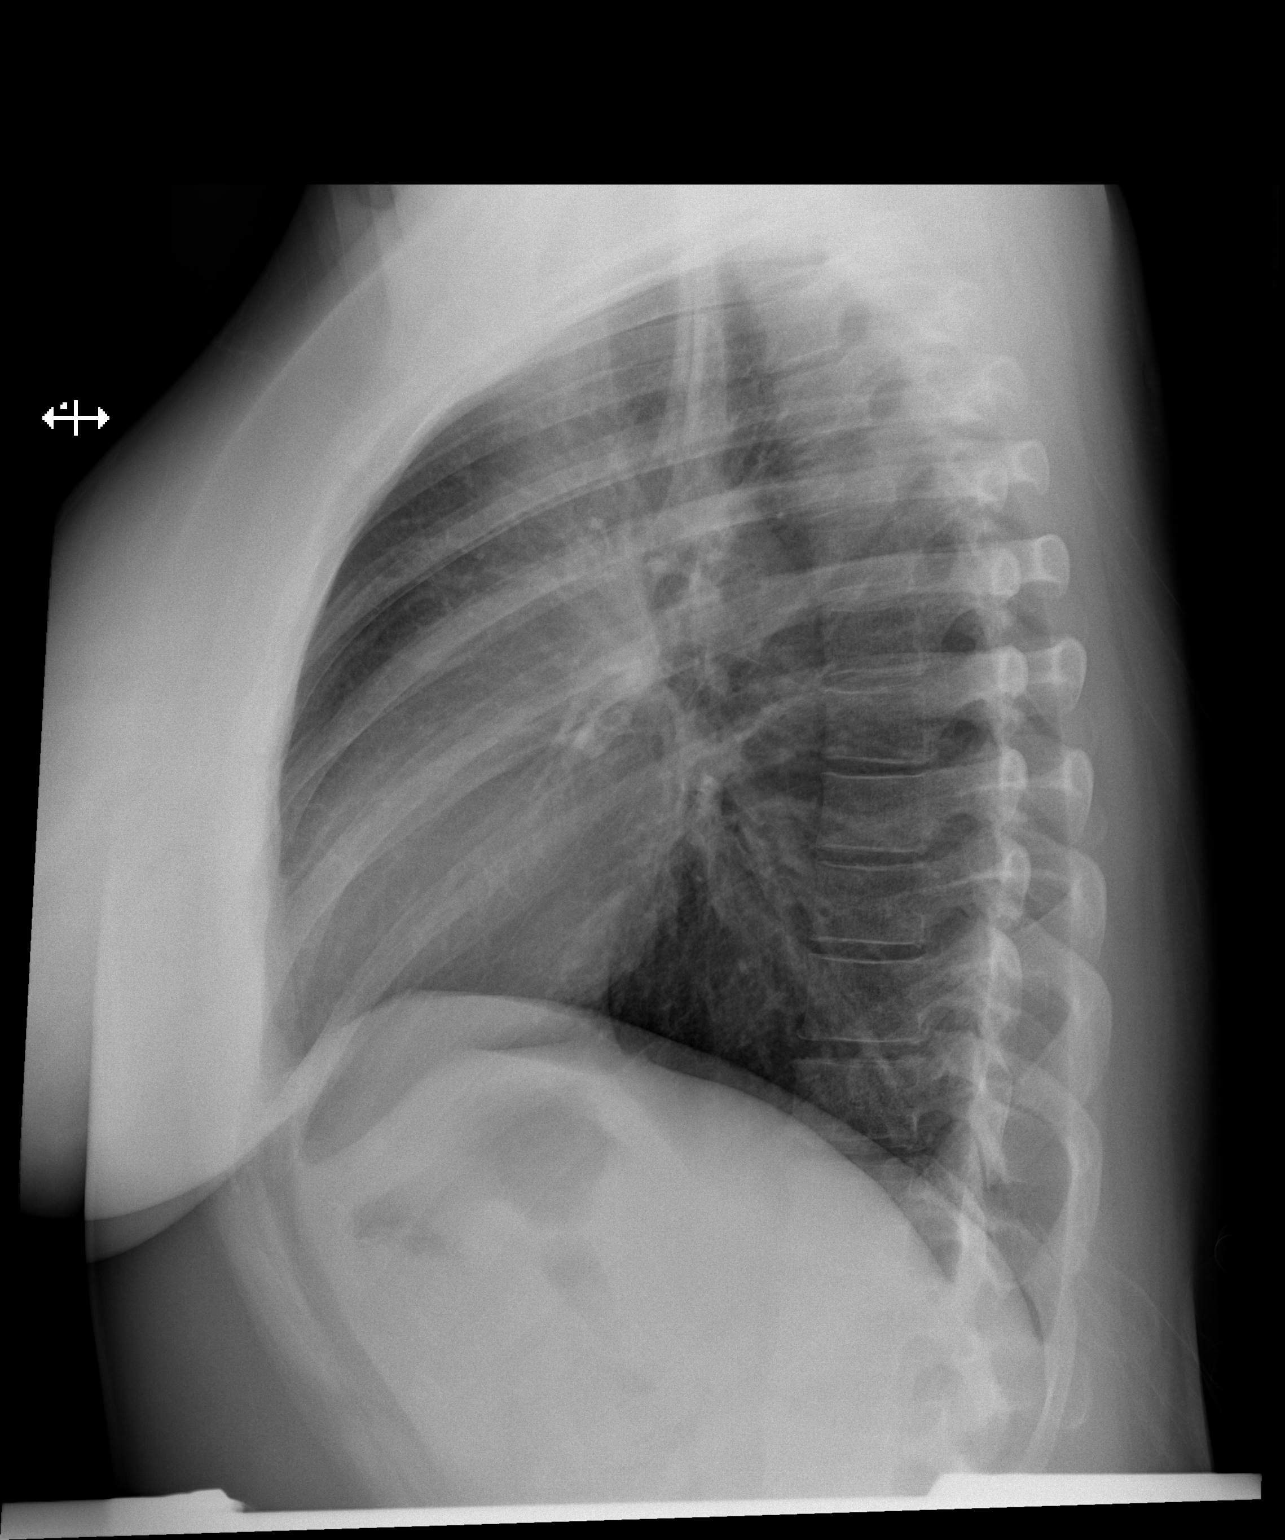

[2 of 2 positions shown; findings below may reference images not displayed]

FINDINGS: The heart size and mediastinal contours are within normal limits.
Both lungs are clear. The visualized skeletal structures are
unremarkable.
IMPRESSION: No active cardiopulmonary disease.

## 2015-08-29 ENCOUNTER — Other Ambulatory Visit: Payer: Self-pay

## 2015-08-29 ENCOUNTER — Other Ambulatory Visit: Payer: Self-pay | Admitting: Obstetrics & Gynecology

## 2015-08-29 DIAGNOSIS — N6489 Other specified disorders of breast: Secondary | ICD-10-CM

## 2015-09-02 ENCOUNTER — Ambulatory Visit
Admission: RE | Admit: 2015-09-02 | Discharge: 2015-09-02 | Disposition: A | Discharge status: Home / Self Care | Payer: BLUE CROSS/BLUE SHIELD | Source: Ambulatory Visit | Attending: Obstetrics & Gynecology | Admitting: Obstetrics & Gynecology

## 2015-09-02 ENCOUNTER — Other Ambulatory Visit: Payer: Self-pay | Admitting: Obstetrics & Gynecology

## 2015-09-02 DIAGNOSIS — N649 Disorder of breast, unspecified: Secondary | ICD-10-CM | POA: Diagnosis not present

## 2015-09-02 DIAGNOSIS — N6489 Other specified disorders of breast: Secondary | ICD-10-CM

## 2015-09-09 ENCOUNTER — Encounter (HOSPITAL_COMMUNITY): Payer: Self-pay | Admitting: Emergency Medicine

## 2015-09-09 ENCOUNTER — Ambulatory Visit (HOSPITAL_COMMUNITY)
Admission: EM | Admit: 2015-09-09 | Discharge: 2015-09-09 | Disposition: A | Discharge status: Home / Self Care | Payer: BLUE CROSS/BLUE SHIELD | Attending: Family Medicine | Admitting: Family Medicine

## 2015-09-09 DIAGNOSIS — F17210 Nicotine dependence, cigarettes, uncomplicated: Secondary | ICD-10-CM | POA: Insufficient documentation

## 2015-09-09 DIAGNOSIS — B9689 Other specified bacterial agents as the cause of diseases classified elsewhere: Secondary | ICD-10-CM | POA: Insufficient documentation

## 2015-09-09 DIAGNOSIS — N760 Acute vaginitis: Secondary | ICD-10-CM | POA: Diagnosis not present

## 2015-09-09 DIAGNOSIS — N898 Other specified noninflammatory disorders of vagina: Secondary | ICD-10-CM | POA: Diagnosis not present

## 2015-09-09 DIAGNOSIS — Z8249 Family history of ischemic heart disease and other diseases of the circulatory system: Secondary | ICD-10-CM | POA: Insufficient documentation

## 2015-09-09 DIAGNOSIS — Z833 Family history of diabetes mellitus: Secondary | ICD-10-CM | POA: Diagnosis not present

## 2015-09-09 LAB — POCT URINALYSIS DIP (DEVICE)
BILIRUBIN URINE: NEGATIVE
Glucose, UA: NEGATIVE mg/dL
KETONES UR: NEGATIVE mg/dL
Leukocytes, UA: NEGATIVE
Nitrite: NEGATIVE
PH: 6 (ref 5.0–8.0)
Protein, ur: NEGATIVE mg/dL
SPECIFIC GRAVITY, URINE: 1.025 (ref 1.005–1.030)
Urobilinogen, UA: 0.2 mg/dL (ref 0.0–1.0)

## 2015-09-09 LAB — POCT PREGNANCY, URINE: PREG TEST UR: NEGATIVE

## 2015-09-09 MED ORDER — METRONIDAZOLE 500 MG PO TABS: 500.0000 mg | ORAL_TABLET | Freq: Two times a day (BID) | ORAL | 0 refills | Status: DC

## 2015-09-09 NOTE — ED Provider Notes (Signed)
CSN: [id number]     Arrival date & time 09/09/15  1248 History   None    Chief Complaint  Patient presents with  . Vaginal Discharge   (Consider location/radiation/quality/duration/timing/severity/associated sxs/prior Treatment) Patient is here for c/o vaginal discharge.  She is requesting an HIV test.  She has been sexually active and not used protection.   The history is provided by the patient.  Vaginal Discharge  Quality:  Clear and white Severity:  Moderate Onset quality:  Sudden Duration:  2 days Progression:  Worsening Chronicity:  New Context: after intercourse   Relieved by:  Nothing Ineffective treatments:  None tried Associated symptoms: vaginal itching   Risk factors: STI exposure and unprotected sex     Past Medical History:  Diagnosis Date  . Abscess   . BOILS, RECURRENT   . Breast tenderness in female   . BV (bacterial vaginosis)   . DENTAL PAIN   . Endometriosis   . FEMALE INFERTILITY   . Gardnerella vaginalis infection   . IRREGULAR MENSTRUATION   . Ovarian cyst   . SEPTATE UTERUS   . Viral labyrinthitis    Past Surgical History:  Procedure Laterality Date  . biopsy of right breast    . BREAST BIOPSY  1999   right  . OVARIAN CYST REMOVAL  Mar 25 2010  . OVARIAN CYST REMOVAL  03/25/2010   right   Family History  Problem Relation Age of Onset  . Hypertension Mother   . Diabetes Mother   . Cancer Father   . Hypertension Father   . Diabetes Brother   . Hypertension Brother    Social History  Substance Use Topics  . Smoking status: Current Every Day Smoker    Packs/day: 0.50    Years: 10.00    Types: Cigarettes  . Smokeless tobacco: Never Used  . Alcohol use Yes     Comment: occas.   OB History    Gravida Para Term Preterm AB Living   0         0   SAB TAB Ectopic Multiple Live Births                 Review of Systems  Constitutional: Negative.   HENT: Negative.   Eyes: Negative.   Respiratory: Negative.   Cardiovascular:  Negative.   Gastrointestinal: Negative.   Endocrine: Negative.   Genitourinary: Positive for vaginal discharge.  Musculoskeletal: Negative.   Skin: Negative.   Allergic/Immunologic: Negative.   Neurological: Negative.   Hematological: Negative.   Psychiatric/Behavioral: Negative.     Allergies  Review of patient's allergies indicates no known allergies.  Home Medications   Prior to Admission medications   Medication Sig Start Date End Date Taking? Authorizing Provider  ciprofloxacin (CIPRO) 250 MG tablet Take 1 tablet (250 mg total) by mouth every 12 (twelve) hours. 05/02/15   Luvenia Redden, PA-C  clindamycin (CLEOCIN) 300 MG capsule Take 1 capsule (300 mg total) by mouth 2 (two) times daily. 05/02/15   Luvenia Redden, PA-C  meloxicam (MOBIC) 7.5 MG tablet Take 1 tablet (7.5 mg total) by mouth 2 (two) times daily after a meal. 04/17/15   Billy Fischer, MD  pantoprazole (PROTONIX) 40 MG tablet Take 1 tablet (40 mg total) by mouth daily. 04/06/15   Manya Silvas, CNM  traMADol (ULTRAM) 50 MG tablet Take 1-2 tablets (50-100 mg total) by mouth every 6 (six) hours as needed for severe pain. 04/06/15   Manya Silvas, CNM  Meds Ordered and Administered this Visit  Medications - No data to display  BP 119/86 (BP Location: Left Arm)   Pulse 81   Temp 98.1 F (36.7 C)   Resp 16   [name] 09/03/2015 (Exact Date)   SpO2 100%  No data found.   Physical Exam  Constitutional: She appears well-developed and well-nourished.  HENT:  Head: Normocephalic and atraumatic.  Eyes: Conjunctivae and EOM are normal. Pupils are equal, round, and reactive to light.  Neck: Normal range of motion. Neck supple.  Cardiovascular: Normal rate, regular rhythm and normal heart sounds.   Pulmonary/Chest: Effort normal and breath sounds normal.  Abdominal: Soft. Bowel sounds are normal.  Genitourinary: Vaginal discharge found.  Genitourinary Comments: Vaginal vault with whitish clear vaginal DC. NO CMT and  adnexa wnl bilateral.  Skin: Skin is warm and dry.  Nursing note and vitals reviewed.   Urgent Care Course   Clinical Course    Procedures (including critical care time)  Labs Review Labs Reviewed  POCT URINALYSIS DIP (DEVICE) - Abnormal; Notable for the following:       Result Value   Hgb urine dipstick TRACE (*)    All other components within normal limits  POCT PREGNANCY, URINE    Imaging Review No results found.   Visual Acuity Review  Right Eye Distance:   Left Eye Distance:   Bilateral Distance:    Right Eye Near:   Left Eye Near:    Bilateral Near:         MDM  BV Vaginal DC  Flagyl 500mg  one po bid x 7 days #14 Endocervical cx GC/ chlamydia, wet prep     Lysbeth Penner, FNP 09/09/15 1507

## 2015-09-09 NOTE — ED Triage Notes (Signed)
Patient requesting hiv screening

## 2015-09-10 LAB — CERVICOVAGINAL ANCILLARY ONLY
Chlamydia: NEGATIVE
Neisseria Gonorrhea: NEGATIVE
Wet Prep (BD Affirm): POSITIVE — AB

## 2015-09-10 LAB — HIV ANTIBODY (ROUTINE TESTING W REFLEX): HIV Screen 4th Generation wRfx: NONREACTIVE

## 2015-10-05 ENCOUNTER — Encounter (HOSPITAL_COMMUNITY): Payer: Self-pay | Admitting: Emergency Medicine

## 2015-10-05 ENCOUNTER — Ambulatory Visit (HOSPITAL_COMMUNITY)
Admission: EM | Admit: 2015-10-05 | Discharge: 2015-10-05 | Disposition: A | Discharge status: Home / Self Care | Payer: BLUE CROSS/BLUE SHIELD

## 2015-10-05 DIAGNOSIS — K0889 Other specified disorders of teeth and supporting structures: Secondary | ICD-10-CM

## 2015-10-05 DIAGNOSIS — K029 Dental caries, unspecified: Secondary | ICD-10-CM

## 2015-10-05 MED ORDER — AMOXICILLIN 500 MG PO CAPS: 500.0000 mg | ORAL_CAPSULE | Freq: Three times a day (TID) | ORAL | 0 refills | Status: DC

## 2015-10-05 MED ORDER — NAPROXEN 500 MG PO TABS: 500.0000 mg | ORAL_TABLET | Freq: Two times a day (BID) | ORAL | 0 refills | Status: DC

## 2015-10-05 MED ORDER — TRAMADOL HCL 50 MG PO TABS: 50.0000 mg | ORAL_TABLET | Freq: Four times a day (QID) | ORAL | 0 refills | Status: DC | PRN

## 2015-10-05 NOTE — Discharge Instructions (Signed)
Take all of your antibiotics for mild infections. The naprosyn will help with pain and swelling and you should take this for the entire course. The pain medication is as needed. If you can get into a Dentist, I did provide you with options for this. Take care. If you become worse with signs of worsening infection then please return.

## 2015-10-05 NOTE — ED Provider Notes (Signed)
CSN: [id number]     Arrival date & time 10/05/15  1202 History   None    Chief Complaint  Patient presents with  . Dental Pain   (Consider location/radiation/quality/duration/timing/severity/associated sxs/prior Treatment) Patient is a 35 yo female who presents with right lower dental pain. Onset 4-5 days. The gum is "swelling" where she has poor dentes. No fever or chills. Cannot afford a dentist at current. She is having moderate pain and trouble sleeping.       Past Medical History:  Diagnosis Date  . Abscess   . BOILS, RECURRENT   . Breast tenderness in female   . BV (bacterial vaginosis)   . DENTAL PAIN   . Endometriosis   . FEMALE INFERTILITY   . Gardnerella vaginalis infection   . IRREGULAR MENSTRUATION   . Ovarian cyst   . SEPTATE UTERUS   . Viral labyrinthitis    Past Surgical History:  Procedure Laterality Date  . biopsy of right breast    . BREAST BIOPSY  1999   right  . OVARIAN CYST REMOVAL  Mar 25 2010  . OVARIAN CYST REMOVAL  03/25/2010   right   Family History  Problem Relation Age of Onset  . Hypertension Mother   . Diabetes Mother   . Cancer Father   . Hypertension Father   . Diabetes Brother   . Hypertension Brother    Social History  Substance Use Topics  . Smoking status: Current Every Day Smoker    Packs/day: 0.50    Years: 10.00    Types: Cigarettes  . Smokeless tobacco: Never Used  . Alcohol use Yes     Comment: occas.   OB History    Gravida Para Term Preterm AB Living   0         0   SAB TAB Ectopic Multiple Live Births                 Review of Systems  Constitutional: Negative for fever.  HENT: Positive for dental problem.   Skin: Negative.     Allergies  Review of patient's allergies indicates no known allergies.  Home Medications   Prior to Admission medications   Medication Sig Start Date End Date Taking? Authorizing Provider  acetaminophen (TYLENOL) 325 MG tablet Take 650 mg by mouth every 6 (six) hours as  needed.   Yes Historical Provider, MD  ciprofloxacin (CIPRO) 250 MG tablet Take 1 tablet (250 mg total) by mouth every 12 (twelve) hours. Patient not taking: Reported on 10/05/2015 05/02/15   Luvenia Redden, PA-C  clindamycin (CLEOCIN) 300 MG capsule Take 1 capsule (300 mg total) by mouth 2 (two) times daily. Patient not taking: Reported on 10/05/2015 05/02/15   Luvenia Redden, PA-C  metroNIDAZOLE (FLAGYL) 500 MG tablet Take 1 tablet (500 mg total) by mouth 2 (two) times daily. Patient not taking: Reported on 10/05/2015 09/09/15   Lysbeth Penner, FNP  naproxen (NAPROSYN) 500 MG tablet Take 1 tablet (500 mg total) by mouth 2 (two) times daily with a meal. 10/05/15   Bjorn Pippin, PA-C  pantoprazole (PROTONIX) 40 MG tablet Take 1 tablet (40 mg total) by mouth daily. Patient not taking: Reported on 10/05/2015 04/06/15   Manya Silvas, CNM  traMADol (ULTRAM) 50 MG tablet Take 1-2 tablets (50-100 mg total) by mouth every 6 (six) hours as needed for severe pain. 10/05/15   Bjorn Pippin, PA-C   Meds Ordered and Administered this Visit  Medications -  No data to display  BP 115/84 (BP Location: Right Arm)   Pulse 74   Temp 98.1 F (36.7 C) (Oral)   Resp 12   [name] 10/04/2015   SpO2 100%  No data found.   Physical Exam  Constitutional: She appears well-developed and well-nourished. No distress.  HENT:  Head: Normocephalic and atraumatic.  Mouth/Throat: No oropharyngeal exudate.  Right lower gum with broken teeth, erythematous gum line with swelling, tender to palpation. No frank abcess  Neck: Normal range of motion.  Lymphadenopathy:    She has no cervical adenopathy.  Skin: Skin is warm and dry. She is not diaphoretic.  Psychiatric: Her behavior is normal.  Nursing note and vitals reviewed.   Urgent Care Course   Clinical Course    Procedures (including critical care time)  Labs Review Labs Reviewed - No data to display  Imaging Review No results found.   Visual Acuity  Review  Right Eye Distance:   Left Eye Distance:   Bilateral Distance:    Right Eye Near:   Left Eye Near:    Bilateral Near:         MDM   1. Dental caries   2. Pain, dental    1. No frank abscess though suspect mild infection. Treat with Amox, NSAIDs and a few tramadol if needed. Information is given for reduced dentists which work with patients without insurance. Advise f/u. Otherwise if an worsening signs of infections then go to the ED.     Bjorn Pippin, PA-C 10/05/15 1258

## 2015-10-05 NOTE — ED Triage Notes (Signed)
Dental pain for one week.  Taking tylenol and advil

## 2015-10-09 ENCOUNTER — Ambulatory Visit (INDEPENDENT_AMBULATORY_CARE_PROVIDER_SITE_OTHER): Payer: BLUE CROSS/BLUE SHIELD | Admitting: Obstetrics & Gynecology

## 2015-10-09 ENCOUNTER — Other Ambulatory Visit: Payer: Self-pay

## 2015-10-09 ENCOUNTER — Encounter: Payer: Self-pay | Admitting: Obstetrics & Gynecology

## 2015-10-09 ENCOUNTER — Encounter: Payer: Self-pay | Admitting: Family Medicine

## 2015-10-09 VITALS — BP 119/84 | HR 84 | Ht 62.0 in | Wt 163.9 lb

## 2015-10-09 DIAGNOSIS — A499 Bacterial infection, unspecified: Secondary | ICD-10-CM

## 2015-10-09 DIAGNOSIS — N760 Acute vaginitis: Secondary | ICD-10-CM

## 2015-10-09 DIAGNOSIS — N979 Female infertility, unspecified: Secondary | ICD-10-CM

## 2015-10-09 DIAGNOSIS — Z01419 Encounter for gynecological examination (general) (routine) without abnormal findings: Secondary | ICD-10-CM

## 2015-10-09 DIAGNOSIS — B9689 Other specified bacterial agents as the cause of diseases classified elsewhere: Secondary | ICD-10-CM

## 2015-10-09 DIAGNOSIS — N809 Endometriosis, unspecified: Secondary | ICD-10-CM

## 2015-10-09 MED ORDER — CLINDAMYCIN PHOSPHATE 2 % VA CREA: 1.0000 | TOPICAL_CREAM | Freq: Every day | VAGINAL | Status: DC

## 2015-10-09 MED ORDER — CLINDAMYCIN PHOSPHATE 2 % VA CREA: 1.0000 | TOPICAL_CREAM | Freq: Every day | VAGINAL | 0 refills | Status: DC

## 2015-10-09 NOTE — Progress Notes (Signed)
Patient ID: [name], female   DOB: 1980/11/10, 35 y.o.   MRN: [id number]  Chief Complaint  Patient presents with  . Gynecologic Exam    HPI [name] is a 35 y.o. female.  G0P0 Patient's last menstrual period was 10/04/2015. Annual exam, is in f/u from mammogram done last month, benign finding with short term repeat in 6 mo. Has sx of BV, has been treated several times. HPI  Past Medical History:  Diagnosis Date  . Abscess   . BOILS, RECURRENT   . Breast tenderness in female   . BV (bacterial vaginosis)   . DENTAL PAIN   . Endometriosis   . FEMALE INFERTILITY   . Gardnerella vaginalis infection   . IRREGULAR MENSTRUATION   . Ovarian cyst   . SEPTATE UTERUS   . Viral labyrinthitis     Past Surgical History:  Procedure Laterality Date  . biopsy of right breast    . BREAST BIOPSY  1999   right  . OVARIAN CYST REMOVAL  Mar 25 2010  . OVARIAN CYST REMOVAL  03/25/2010   right    Family History  Problem Relation Age of Onset  . Hypertension Mother   . Diabetes Mother   . Cancer Father   . Hypertension Father   . Diabetes Brother   . Hypertension Brother     Social History Social History  Substance Use Topics  . Smoking status: Current Every Day Smoker    Packs/day: 0.50    Years: 10.00    Types: Cigarettes  . Smokeless tobacco: Never Used  . Alcohol use Yes     Comment: occas.    No Known Allergies  Current Outpatient Prescriptions  Medication Sig Dispense Refill  . acetaminophen (TYLENOL) 325 MG tablet Take 650 mg by mouth every 6 (six) hours as needed.    Marland Kitchen amoxicillin (AMOXIL) 500 MG capsule Take 1 capsule (500 mg total) by mouth 3 (three) times daily. 20 capsule 0  . ciprofloxacin (CIPRO) 250 MG tablet Take 1 tablet (250 mg total) by mouth every 12 (twelve) hours. (Patient not taking: Reported on 10/05/2015) 6 tablet 0  . clindamycin (CLEOCIN) 300 MG capsule Take 1 capsule (300 mg total) by mouth 2 (two) times daily. (Patient not taking: Reported  on 10/05/2015) 14 capsule 0  . metroNIDAZOLE (FLAGYL) 500 MG tablet Take 1 tablet (500 mg total) by mouth 2 (two) times daily. (Patient not taking: Reported on 10/05/2015) 14 tablet 0  . naproxen (NAPROSYN) 500 MG tablet Take 1 tablet (500 mg total) by mouth 2 (two) times daily with a meal. 30 tablet 0  . pantoprazole (PROTONIX) 40 MG tablet Take 1 tablet (40 mg total) by mouth daily. (Patient not taking: Reported on 10/05/2015) 30 tablet 2  . traMADol (ULTRAM) 50 MG tablet Take 1-2 tablets (50-100 mg total) by mouth every 6 (six) hours as needed for severe pain. 15 tablet 0   Current Facility-Administered Medications  Medication Dose Route Frequency Provider Last Rate Last Dose  . clindamycin (CLEOCIN) 2 % vaginal cream 1 Applicatorful  1 Applicatorful Vaginal QHS Woodroe Mode, MD        Review of Systems Review of Systems  Gastrointestinal: Negative.   Genitourinary: Positive for vaginal discharge. Negative for menstrual problem, pelvic pain and vaginal pain.       Wants to conceive    Blood pressure 119/84, pulse 84, height 5\' 2"  (1.575 m), weight 163 lb 14.4 oz (74.3 kg), last menstrual period  10/04/2015.  Physical Exam Physical Exam  Constitutional: She is oriented to person, place, and time. She appears well-developed. No distress.  Cardiovascular: Normal rate.   Pulmonary/Chest: Effort normal.  Genitourinary: Uterus normal. Vaginal discharge (wet prep sent) found.  Genitourinary Comments: No mass or tenderness  Neurological: She is alert and oriented to person, place, and time.  Psychiatric: She has a normal mood and affect. Her behavior is normal.    Data Reviewed Pap normal 2015 Mammogram result  Assessment    Recurrent sx of BV Well woman exam Endometriosis Infertility     Plan    Referred to Dr Kerin Perna Cleocin cream for 7 days Report if BV does not resolve Pap repeat 3-5 year routine       [name] 10/09/2015, 2:51 PM

## 2015-10-10 LAB — WET PREP, GENITAL
TRICH WET PREP: NONE SEEN
WBC WET PREP: NONE SEEN
YEAST WET PREP: NONE SEEN

## 2015-10-16 ENCOUNTER — Telehealth: Payer: Self-pay | Admitting: General Practice

## 2015-10-16 DIAGNOSIS — N760 Acute vaginitis: Secondary | ICD-10-CM

## 2015-10-16 DIAGNOSIS — B9689 Other specified bacterial agents as the cause of diseases classified elsewhere: Secondary | ICD-10-CM

## 2015-10-16 MED ORDER — METRONIDAZOLE 500 MG PO TABS
500.0000 mg | ORAL_TABLET | Freq: Two times a day (BID) | ORAL | 0 refills | Status: DC
Start: 2015-10-16 — End: 2015-11-11

## 2015-10-16 MED ORDER — METRONIDAZOLE 500 MG PO TABS
500.0000 mg | ORAL_TABLET | Freq: Two times a day (BID) | ORAL | 0 refills | Status: DC
Start: 2015-10-16 — End: 2015-10-16

## 2015-10-16 NOTE — Telephone Encounter (Signed)
Per Dr Roselie Awkward, patient has BV and needs flagyl sent to pharmacy. Called patient & informed her of results & medication sent to pharmacy. Patient verbalized understanding & had no questions

## 2015-11-11 ENCOUNTER — Encounter (HOSPITAL_COMMUNITY): Payer: Self-pay | Admitting: *Deleted

## 2015-11-11 ENCOUNTER — Ambulatory Visit (HOSPITAL_COMMUNITY)
Admission: EM | Admit: 2015-11-11 | Discharge: 2015-11-11 | Disposition: A | Discharge status: Home / Self Care | Payer: BLUE CROSS/BLUE SHIELD | Attending: Internal Medicine | Admitting: Internal Medicine

## 2015-11-11 DIAGNOSIS — Z711 Person with feared health complaint in whom no diagnosis is made: Secondary | ICD-10-CM

## 2015-11-11 DIAGNOSIS — R0789 Other chest pain: Secondary | ICD-10-CM | POA: Diagnosis not present

## 2015-11-11 DIAGNOSIS — J019 Acute sinusitis, unspecified: Secondary | ICD-10-CM | POA: Diagnosis not present

## 2015-11-11 DIAGNOSIS — R42 Dizziness and giddiness: Secondary | ICD-10-CM | POA: Insufficient documentation

## 2015-11-11 DIAGNOSIS — F17210 Nicotine dependence, cigarettes, uncomplicated: Secondary | ICD-10-CM | POA: Diagnosis not present

## 2015-11-11 LAB — POCT RAPID STREP A: STREPTOCOCCUS, GROUP A SCREEN (DIRECT): NEGATIVE

## 2015-11-11 MED ORDER — AMOXICILLIN-POT CLAVULANATE 875-125 MG PO TABS: 1.0000 | ORAL_TABLET | Freq: Two times a day (BID) | ORAL | 0 refills | Status: DC

## 2015-11-11 NOTE — ED Notes (Signed)
During exam with provider patient stated she would like STD screening, provider and EMT at bedside for pelvic exam

## 2015-11-11 NOTE — Discharge Instructions (Signed)
Symptoms today seem most likely to be due to upper respiratory infection/sinus infection.  Test results should be available in several days.  Prescription for amoxicillin/clavulanate sent to the [facility] on [address].   Recheck for increasing phlegm production or new fever >100.5.  Anticipate gradual improvement in cough/congestion/hoarseness over the next week or two.

## 2015-11-11 NOTE — ED Provider Notes (Signed)
[facility]    CSN: [id number] Arrival date & time: 11/11/15  1714     History   Chief Complaint Chief Complaint  Patient presents with  . Nasal Congestion  . Exposure to STD    HPI [name] is a 35 y.o. female. She presents today with more than a week's worth of runny/congested nose, chest tightness, productive cough. Sore throat, hoarseness. No fever. Had some nausea and dizziness this morning. Nasal and chest symptoms are improving, but not resolved. She is somewhat worried about an STD in her throat. She has a little bit of vaginal discharge.   HPI  Past Medical History:  Diagnosis Date  . Abscess   . BOILS, RECURRENT   . Breast tenderness in female   . BV (bacterial vaginosis)   . DENTAL PAIN   . Endometriosis   . FEMALE INFERTILITY   . Gardnerella vaginalis infection   . IRREGULAR MENSTRUATION   . Ovarian cyst   . SEPTATE UTERUS   . Viral labyrinthitis     Patient Active Problem List   Diagnosis Date Noted  . Breast mass in female 01/22/2013  . Cervical cancer screening 01/11/2013  . Screening for STD (sexually transmitted disease) 07/06/2012  . BV (bacterial vaginosis) 01/18/2012  . General counseling for prescription of oral contraceptives 11/06/2011  . Endometriosis 05/01/2011  . Breast tenderness in female 05/01/2011  . Ovarian cyst   . BOILS, RECURRENT 12/27/2009  . SEPTATE UTERUS 07/24/2009  . TOBACCO ABUSE 09/26/2008  . Female infertility 09/26/2008  . DENTAL PAIN 04/30/2008  . VIRAL LABYRINTHITIS 06/20/2007  . IRREGULAR MENSTRUATION 09/21/2006    Past Surgical History:  Procedure Laterality Date  . biopsy of right breast    . BREAST BIOPSY  1999   right  . OVARIAN CYST REMOVAL  Mar 25 2010  . OVARIAN CYST REMOVAL  03/25/2010   right    OB History    Gravida Para Term Preterm AB Living   0         0   SAB TAB Ectopic Multiple Live Births                   Home Medications    Prior to Admission medications     Medication Sig Start Date End Date Taking? Authorizing Provider  acetaminophen (TYLENOL) 325 MG tablet Take 650 mg by mouth every 6 (six) hours as needed.    Historical Provider, MD  amoxicillin-clavulanate (AUGMENTIN) 875-125 MG tablet Take 1 tablet by mouth every 12 (twelve) hours. 11/11/15   Sherlene Shams, MD  clindamycin (CLEOCIN) 2 % vaginal cream Place 1 Applicatorful vaginally at bedtime. 10/09/15   Woodroe Mode, MD    Family History Family History  Problem Relation Age of Onset  . Hypertension Mother   . Diabetes Mother   . Cancer Father   . Hypertension Father   . Diabetes Brother   . Hypertension Brother     Social History Social History  Substance Use Topics  . Smoking status: Current Every Day Smoker    Packs/day: 0.50    Years: 10.00    Types: Cigarettes  . Smokeless tobacco: Never Used  . Alcohol use Yes     Comment: occas.     Allergies   Review of patient's allergies indicates no known allergies.   Review of Systems Review of Systems  All other systems reviewed and are negative.    Physical Exam Triage Vital Signs ED Triage Vitals  Enc Vitals Group     BP 11/11/15 1731 110/60     Pulse Rate 11/11/15 1731 82     Resp 11/11/15 1731 12     Temp 11/11/15 1731 98.9 F (37.2 C)     Temp Source 11/11/15 1731 Oral     SpO2 11/11/15 1731 100 %     Weight --      Height --      Pain Score 11/11/15 1808 5   Updated Vital Signs BP 110/60 (BP Location: Left Arm)   Pulse 82   Temp 98.9 F (37.2 C) (Oral)   Resp 12   [name] 10/21/2015   SpO2 100%  Physical Exam  Constitutional: She is oriented to person, place, and time. No distress.  Alert, nicely groomed  HENT:  Head: Atraumatic.  Eyes:  Conjugate gaze, no eye redness/drainage  Neck: Neck supple.  Cardiovascular: Normal rate and regular rhythm.   Pulmonary/Chest: No respiratory distress. She has no wheezes. She has no rales.  Lungs clear, symmetric breath sounds  Abdominal: She exhibits no  distension.  Genitourinary: Vaginal discharge found.  Genitourinary Comments: Creamy white vaginal discharge.  Swabs take for GC/chlamydia.  Musculoskeletal: Normal range of motion.  No leg swelling  Neurological: She is alert and oriented to person, place, and time.  Skin: Skin is warm and dry.  No cyanosis  Nursing note and vitals reviewed.    UC Treatments / Results  Labs Results for orders placed or performed during the hospital encounter of 11/11/15  Culture, group A strep  Result Value Ref Range   Specimen Description THROAT    Special Requests NONE    Culture TOO YOUNG TO READ    Report Status PENDING   POCT rapid strep A [facility])  Result Value Ref Range   Streptococcus, Group A Screen (Direct) NEGATIVE NEGATIVE    Procedures Procedures (including critical care time)      None today   Final Clinical Impressions(s) / UC Diagnoses   Final diagnoses:  Acute sinusitis with symptoms > 10 days  Concern about sexually transmitted disease in female without diagnosis    New Prescriptions Discharge Medication List as of 11/11/2015  7:22 PM    START taking these medications   Details  amoxicillin-clavulanate (AUGMENTIN) 875-125 MG tablet Take 1 tablet by mouth every 12 (twelve) hours., Starting Mon 11/11/2015, Normal         Sherlene Shams, MD 11/12/15 [phone]

## 2015-11-12 LAB — GC/CHLAMYDIA PROBE AMP (~~LOC~~) NOT AT ARMC
Chlamydia: NEGATIVE
Neisseria Gonorrhea: NEGATIVE

## 2015-11-12 LAB — CERVICOVAGINAL ANCILLARY ONLY: WET PREP (BD AFFIRM): POSITIVE — AB

## 2015-11-14 LAB — CULTURE, GROUP A STREP (THRC)

## 2016-01-05 ENCOUNTER — Ambulatory Visit (HOSPITAL_COMMUNITY)
Admission: EM | Admit: 2016-01-05 | Discharge: 2016-01-05 | Disposition: A | Discharge status: Home / Self Care | Payer: BLUE CROSS/BLUE SHIELD | Attending: Family Medicine | Admitting: Family Medicine

## 2016-01-05 ENCOUNTER — Encounter (HOSPITAL_COMMUNITY): Payer: Self-pay

## 2016-01-05 DIAGNOSIS — J069 Acute upper respiratory infection, unspecified: Secondary | ICD-10-CM

## 2016-01-05 DIAGNOSIS — H9209 Otalgia, unspecified ear: Secondary | ICD-10-CM | POA: Diagnosis not present

## 2016-01-05 DIAGNOSIS — N760 Acute vaginitis: Secondary | ICD-10-CM

## 2016-01-05 DIAGNOSIS — N898 Other specified noninflammatory disorders of vagina: Secondary | ICD-10-CM | POA: Diagnosis not present

## 2016-01-05 LAB — POCT URINALYSIS DIP (DEVICE)
Bilirubin Urine: NEGATIVE
Glucose, UA: NEGATIVE mg/dL
Hgb urine dipstick: NEGATIVE
KETONES UR: NEGATIVE mg/dL
Nitrite: NEGATIVE
PH: 7 (ref 5.0–8.0)
PROTEIN: NEGATIVE mg/dL
SPECIFIC GRAVITY, URINE: 1.02 (ref 1.005–1.030)
Urobilinogen, UA: 0.2 mg/dL (ref 0.0–1.0)

## 2016-01-05 MED ORDER — TERCONAZOLE 80 MG VA SUPP: 80.0000 mg | Freq: Every day | VAGINAL | 0 refills | Status: DC

## 2016-01-05 MED ORDER — FLUCONAZOLE 150 MG PO TABS: 150.0000 mg | ORAL_TABLET | Freq: Once | ORAL | 1 refills | Status: AC

## 2016-01-05 MED ORDER — IPRATROPIUM BROMIDE 0.06 % NA SOLN: 2.0000 | Freq: Four times a day (QID) | NASAL | 1 refills | Status: DC

## 2016-01-05 NOTE — ED Notes (Signed)
Dr  Juventino Slovak  Did  Pelvic     With  Vladimir Faster

## 2016-01-05 NOTE — ED Triage Notes (Signed)
Pt said she is having vaginal itching and burning for 2 days and would like to get tested for STD. Did have unprotected sex last week. Having white discharge but no fever. Right ear pain also and sore throat.

## 2016-01-05 NOTE — Discharge Instructions (Signed)
We will call with positive test results and treat as indicated  °

## 2016-01-05 NOTE — ED Notes (Signed)
Clean and dirty urine specimen obtained and in the lab

## 2016-01-05 NOTE — ED Provider Notes (Addendum)
[place]    CSN: [id number] Arrival date & time: 01/05/16  1427     History   Chief Complaint Chief Complaint  Patient presents with  . Vaginal Itching  . Otalgia    HPI [name] is a 35 y.o. female.   The history is provided by the patient.  Vaginal Itching  This is a new problem. The current episode started 2 days ago. The problem has been gradually worsening. Pertinent negatives include no chest pain, no abdominal pain, no headaches and no shortness of breath. The symptoms are aggravated by swallowing.  Otalgia  Associated symptoms: congestion, rhinorrhea and sore throat   Associated symptoms: no abdominal pain and no headaches     Past Medical History:  Diagnosis Date  . Abscess   . BOILS, RECURRENT   . Breast tenderness in female   . BV (bacterial vaginosis)   . DENTAL PAIN   . Endometriosis   . FEMALE INFERTILITY   . Gardnerella vaginalis infection   . IRREGULAR MENSTRUATION   . Ovarian cyst   . SEPTATE UTERUS   . Viral labyrinthitis     Patient Active Problem List   Diagnosis Date Noted  . Breast mass in female 01/22/2013  . Cervical cancer screening 01/11/2013  . Screening for STD (sexually transmitted disease) 07/06/2012  . BV (bacterial vaginosis) 01/18/2012  . General counseling for prescription of oral contraceptives 11/06/2011  . Endometriosis 05/01/2011  . Breast tenderness in female 05/01/2011  . Ovarian cyst   . BOILS, RECURRENT 12/27/2009  . SEPTATE UTERUS 07/24/2009  . TOBACCO ABUSE 09/26/2008  . Female infertility 09/26/2008  . DENTAL PAIN 04/30/2008  . VIRAL LABYRINTHITIS 06/20/2007  . IRREGULAR MENSTRUATION 09/21/2006    Past Surgical History:  Procedure Laterality Date  . biopsy of right breast    . BREAST BIOPSY  1999   right  . OVARIAN CYST REMOVAL  Mar 25 2010  . OVARIAN CYST REMOVAL  03/25/2010   right    OB History    Gravida Para Term Preterm AB Living   0         0   SAB TAB Ectopic Multiple  Live Births                   Home Medications    Prior to Admission medications   Medication Sig Start Date End Date Taking? Authorizing Provider  acetaminophen (TYLENOL) 325 MG tablet Take 650 mg by mouth every 6 (six) hours as needed.    Historical Provider, MD  amoxicillin-clavulanate (AUGMENTIN) 875-125 MG tablet Take 1 tablet by mouth every 12 (twelve) hours. 11/11/15   Sherlene Shams, MD  clindamycin (CLEOCIN) 2 % vaginal cream Place 1 Applicatorful vaginally at bedtime. 10/09/15   Woodroe Mode, MD  fluconazole (DIFLUCAN) 150 MG tablet Take 1 tablet (150 mg total) by mouth once. 01/05/16 01/05/16  Billy Fischer, MD  ipratropium (ATROVENT) 0.06 % nasal spray Place 2 sprays into both nostrils 4 (four) times daily. 01/05/16   Billy Fischer, MD  terconazole (TERAZOL 3) 80 MG vaginal suppository Place 1 suppository (80 mg total) vaginally at bedtime. 01/05/16   Billy Fischer, MD    Family History Family History  Problem Relation Age of Onset  . Hypertension Mother   . Diabetes Mother   . Cancer Father   . Hypertension Father   . Diabetes Brother   . Hypertension Brother     Social History Social History  Substance Use Topics  . Smoking status: Current Every Day Smoker    Packs/day: 0.50    Years: 10.00    Types: Cigarettes  . Smokeless tobacco: Never Used  . Alcohol use Yes     Comment: occas.     Allergies   Patient has no known allergies.   Review of Systems Review of Systems  Constitutional: Negative.   HENT: Positive for congestion, ear pain, postnasal drip, rhinorrhea and sore throat.   Respiratory: Negative for shortness of breath.   Cardiovascular: Negative for chest pain.  Gastrointestinal: Negative for abdominal pain.  Genitourinary: Positive for vaginal discharge. Negative for menstrual problem, pelvic pain and vaginal bleeding.  Neurological: Negative for headaches.  All other systems reviewed and are negative.    Physical Exam Triage Vital  Signs ED Triage Vitals  Enc Vitals Group     BP 01/05/16 1555 113/74     Pulse Rate 01/05/16 1555 94     Resp 01/05/16 1555 16     Temp 01/05/16 1555 98.2 F (36.8 C)     Temp Source 01/05/16 1555 Oral     SpO2 01/05/16 1555 99 %     Weight --      Height --      Head Circumference --      Peak Flow --      Pain Score 01/05/16 1559 3     Pain Loc --      Pain Edu? --      Excl. in [place]? --    No data found.   Updated Vital Signs BP 113/74 (BP Location: Left Arm)   Pulse 94   Temp 98.2 F (36.8 C) (Oral)   Resp 16   [name] 12/15/2015 (Exact Date)   SpO2 99%   Visual Acuity Right Eye Distance:   Left Eye Distance:   Bilateral Distance:    Right Eye Near:   Left Eye Near:    Bilateral Near:     Physical Exam  Constitutional: She appears well-developed and well-nourished. She appears distressed.  HENT:  Right Ear: External ear normal.  Left Ear: External ear normal.  Mouth/Throat: No oropharyngeal exudate.  Eyes: Pupils are equal, round, and reactive to light.  Neck: Normal range of motion. Neck supple.  Cardiovascular: Normal rate, regular rhythm, normal heart sounds and intact distal pulses.   Pulmonary/Chest: Effort normal and breath sounds normal.  Abdominal: Soft. Bowel sounds are normal. There is no tenderness.  Genitourinary: Uterus normal. Pelvic exam was performed with patient supine. Cervix exhibits no discharge. Right adnexum displays no mass and no tenderness. Left adnexum displays no mass and no tenderness. There is erythema in the vagina. Vaginal discharge found.  Lymphadenopathy:    She has no cervical adenopathy.  Skin: Skin is warm and dry.  Nursing note and vitals reviewed.    UC Treatments / Results  Labs (all labs ordered are listed, but only abnormal results are displayed) Labs Reviewed  POCT URINALYSIS DIP (DEVICE) - Abnormal; Notable for the following:       Result Value   Leukocytes, UA SMALL (*)    All other components within normal  limits  HIV ANTIBODY (ROUTINE TESTING)  RPR  URINE CYTOLOGY ANCILLARY ONLY    EKG  EKG Interpretation None       Radiology No results found.  Procedures Procedures (including critical care time)  Medications Ordered in UC Medications - No data to display   Initial Impression / Assessment and Plan /  UC Course  I have reviewed the triage vital signs and the nursing notes.  Pertinent labs & imaging results that were available during my care of the patient were reviewed by me and considered in my medical decision making (see chart for details).  Clinical Course       Final Clinical Impressions(s) / UC Diagnoses   Final diagnoses:  Upper respiratory tract infection, unspecified type  Acute vaginitis    New Prescriptions New Prescriptions   FLUCONAZOLE (DIFLUCAN) 150 MG TABLET    Take 1 tablet (150 mg total) by mouth once.   IPRATROPIUM (ATROVENT) 0.06 % NASAL SPRAY    Place 2 sprays into both nostrils 4 (four) times daily.   TERCONAZOLE (TERAZOL 3) 80 MG VAGINAL SUPPOSITORY    Place 1 suppository (80 mg total) vaginally at bedtime.     Billy Fischer, MD 01/05/16 [place], MD 01/05/16 [place], MD 01/05/16 [phone]

## 2016-01-06 LAB — HIV ANTIBODY (ROUTINE TESTING W REFLEX): HIV SCREEN 4TH GENERATION: NONREACTIVE

## 2016-01-06 LAB — RPR: RPR: NONREACTIVE

## 2016-01-07 LAB — CERVICOVAGINAL ANCILLARY ONLY: Wet Prep (BD Affirm): POSITIVE — AB

## 2016-01-08 ENCOUNTER — Telehealth (HOSPITAL_COMMUNITY): Payer: Self-pay | Admitting: Emergency Medicine

## 2016-01-08 LAB — URINE CYTOLOGY ANCILLARY ONLY
CANDIDA VAGINITIS: POSITIVE — AB
Chlamydia: NEGATIVE
Neisseria Gonorrhea: NEGATIVE
TRICH (WINDOWPATH): NEGATIVE

## 2016-01-08 LAB — CERVICOVAGINAL ANCILLARY ONLY
CHLAMYDIA, DNA PROBE: NEGATIVE
Neisseria Gonorrhea: NEGATIVE

## 2016-01-08 MED ORDER — METRONIDAZOLE 500 MG PO TABS: 500.0000 mg | ORAL_TABLET | Freq: Two times a day (BID) | ORAL | 0 refills | Status: DC

## 2016-01-08 NOTE — Telephone Encounter (Signed)
Called pt and notified her that the other results we were waiting for have come in   Pt is neg for Gc/Chlam, Trich  Pt verb understanding.

## 2016-01-08 NOTE — Telephone Encounter (Signed)
Called pt and notified of recent lab results from visit 11/26 Pt ID'd properly... Reports persistent vaginal itching States she's taken diflucan Per Ruta Hinds, PA... Ok to call in Flagyl 500 mg BID x7 days Per pt's request, called in medication to [facility] ([facility]) Notified we are still waiting on Gc/Chlam but we will call her if it comes back positive Educated pt on MyChart... States she's aware and does have an account.  Adv pt if sx are not getting better to return or to f/u w/PCP Pt verb understanding.

## 2016-02-07 ENCOUNTER — Other Ambulatory Visit: Payer: Self-pay | Admitting: Obstetrics & Gynecology

## 2016-02-07 DIAGNOSIS — D241 Benign neoplasm of right breast: Secondary | ICD-10-CM

## 2016-03-05 ENCOUNTER — Other Ambulatory Visit: Payer: BLUE CROSS/BLUE SHIELD

## 2016-03-10 ENCOUNTER — Other Ambulatory Visit: Payer: Self-pay | Admitting: Obstetrics & Gynecology

## 2016-03-10 ENCOUNTER — Ambulatory Visit
Admission: RE | Admit: 2016-03-10 | Discharge: 2016-03-10 | Disposition: A | Discharge status: Home / Self Care | Payer: BLUE CROSS/BLUE SHIELD | Source: Ambulatory Visit | Attending: Obstetrics & Gynecology | Admitting: Obstetrics & Gynecology

## 2016-03-10 DIAGNOSIS — D241 Benign neoplasm of right breast: Secondary | ICD-10-CM

## 2016-03-10 DIAGNOSIS — N6311 Unspecified lump in the right breast, upper outer quadrant: Secondary | ICD-10-CM | POA: Diagnosis not present

## 2016-07-22 ENCOUNTER — Ambulatory Visit (HOSPITAL_COMMUNITY)
Admission: EM | Admit: 2016-07-22 | Discharge: 2016-07-22 | Disposition: A | Discharge status: Home / Self Care | Payer: BLUE CROSS/BLUE SHIELD | Attending: Family Medicine | Admitting: Family Medicine

## 2016-07-22 ENCOUNTER — Encounter (HOSPITAL_COMMUNITY): Payer: Self-pay | Admitting: Emergency Medicine

## 2016-07-22 DIAGNOSIS — J039 Acute tonsillitis, unspecified: Secondary | ICD-10-CM | POA: Insufficient documentation

## 2016-07-22 DIAGNOSIS — J358 Other chronic diseases of tonsils and adenoids: Secondary | ICD-10-CM | POA: Diagnosis not present

## 2016-07-22 DIAGNOSIS — F17210 Nicotine dependence, cigarettes, uncomplicated: Secondary | ICD-10-CM | POA: Diagnosis not present

## 2016-07-22 LAB — POCT RAPID STREP A: STREPTOCOCCUS, GROUP A SCREEN (DIRECT): NEGATIVE

## 2016-07-22 MED ORDER — LIDOCAINE VISCOUS 2 % MT SOLN: OROMUCOSAL | 0 refills | Status: DC

## 2016-07-22 NOTE — ED Triage Notes (Signed)
Pt reports having some mild left ear and throat pain for the last week.  She noticed a stone in her tonsil about two days ago.  She denies any fever.

## 2016-07-22 NOTE — Discharge Instructions (Signed)
Tonsillar erythema highly likely from tonsillar stones. Throat culture sent.  Will Tx with ABX if culture turns positive.  Viscous lidocaine and Ibuprofen for symptomatic relief.  Saline gargles as advised.

## 2016-07-22 NOTE — ED Provider Notes (Signed)
CSN: [id number]     Arrival date & time 07/22/16  1337 History   First MD Initiated Contact with Patient 07/22/16 1445     Chief Complaint  Patient presents with  . tonsil stone    left   (Consider location/radiation/quality/duration/timing/severity/associated sxs/prior Treatment) HPI 36 y/o female presented with CC of sore throat with white spot to left tonsillar gland x 1 week. Painful to swallow and pain referred top left ear.  Denies fever/chills.    Past Medical History:  Diagnosis Date  . Abscess   . BOILS, RECURRENT   . Breast tenderness in female   . BV (bacterial vaginosis)   . DENTAL PAIN   . Endometriosis   . FEMALE INFERTILITY   . Gardnerella vaginalis infection   . IRREGULAR MENSTRUATION   . Ovarian cyst   . SEPTATE UTERUS   . Viral labyrinthitis    Past Surgical History:  Procedure Laterality Date  . biopsy of right breast    . BREAST BIOPSY  1999   right  . OVARIAN CYST REMOVAL  Mar 25 2010  . OVARIAN CYST REMOVAL  03/25/2010   right   Family History  Problem Relation Age of Onset  . Hypertension Mother   . Diabetes Mother   . Cancer Father   . Hypertension Father   . Diabetes Brother   . Hypertension Brother    Social History  Substance Use Topics  . Smoking status: Current Every Day Smoker    Packs/day: 0.50    Years: 10.00    Types: Cigarettes  . Smokeless tobacco: Never Used  . Alcohol use Yes     Comment: occas.   OB History    Gravida Para Term Preterm AB Living   0         0   SAB TAB Ectopic Multiple Live Births                 Review of Systems  Constitutional: Negative.   HENT: Positive for ear pain (left ear ) and sore throat.     Allergies  Patient has no known allergies.  Home Medications   Prior to Admission medications   Medication Sig Start Date End Date Taking? Authorizing Provider  acetaminophen (TYLENOL) 325 MG tablet Take 650 mg by mouth every 6 (six) hours as needed.    [provider]    amoxicillin-clavulanate (AUGMENTIN) 875-125 MG tablet Take 1 tablet by mouth every 12 (twelve) hours. 11/11/15   Sherlene Shams, MD  clindamycin (CLEOCIN) 2 % vaginal cream Place 1 Applicatorful vaginally at bedtime. 10/09/15   Woodroe Mode, MD  ipratropium (ATROVENT) 0.06 % nasal spray Place 2 sprays into both nostrils 4 (four) times daily. 01/05/16   Billy Fischer, MD  lidocaine (XYLOCAINE) 2 % solution 10 ML by mouth twice daily . Swish and swallow 07/22/16   Lavette Yankovich, NP  metroNIDAZOLE (FLAGYL) 500 MG tablet Take 1 tablet (500 mg total) by mouth 2 (two) times daily. 01/08/16   Melony Overly, MD  terconazole (TERAZOL 3) 80 MG vaginal suppository Place 1 suppository (80 mg total) vaginally at bedtime. 01/05/16   Billy Fischer, MD   Meds Ordered and Administered this Visit  Medications - No data to display  BP 109/78 (BP Location: Right Arm)   Pulse 80   Temp 98.5 F (36.9 C) (Oral)   [name] 07/02/2016 (Exact Date)   SpO2 100%  No data found.   Physical Exam  Constitutional: She  is oriented to person, place, and time. She appears well-developed and well-nourished.  HENT:  Head: Normocephalic.  Right Ear: External ear normal.  Left Ear: External ear normal.  Nose: Nose normal.  Mouth/Throat: No oropharyngeal exudate.    Neck: Normal range of motion.  Cardiovascular: Normal rate and regular rhythm.   Pulmonary/Chest: Effort normal and breath sounds normal.  Neurological: She is alert and oriented to person, place, and time.  Skin: Skin is warm.  Psychiatric: She has a normal mood and affect.    Urgent Care Course     Procedures (including critical care time)  Labs Review Labs Reviewed  POCT RAPID STREP A    Imaging Review No results found.   Visual Acuity Review  Right Eye Distance:   Left Eye Distance:   Bilateral Distance:    Right Eye Near:   Left Eye Near:    Bilateral Near:         MDM   1. Acute tonsillitis, unspecified etiology    2. Tonsillith   Rapid Strep negative. Tonsillar erythema highly likely from tonsillar stones. Throat culture sent. Will Tx with ABX if culture turns positive. Viscous lidocaine and Ibuprofen for symptomatic relief. Saline gargles as advised.     Teola Bradley, NP 07/22/16 1534

## 2016-07-25 LAB — CULTURE, GROUP A STREP (THRC)

## 2016-08-07 DIAGNOSIS — N760 Acute vaginitis: Secondary | ICD-10-CM | POA: Diagnosis not present

## 2016-08-07 DIAGNOSIS — N926 Irregular menstruation, unspecified: Secondary | ICD-10-CM | POA: Diagnosis not present

## 2016-08-07 DIAGNOSIS — Z113 Encounter for screening for infections with a predominantly sexual mode of transmission: Secondary | ICD-10-CM | POA: Diagnosis not present

## 2016-08-07 DIAGNOSIS — Z6829 Body mass index (BMI) 29.0-29.9, adult: Secondary | ICD-10-CM | POA: Diagnosis not present

## 2016-08-07 DIAGNOSIS — Z01419 Encounter for gynecological examination (general) (routine) without abnormal findings: Secondary | ICD-10-CM | POA: Diagnosis not present

## 2016-08-25 DIAGNOSIS — N890 Mild vaginal dysplasia: Secondary | ICD-10-CM | POA: Diagnosis not present

## 2016-08-25 DIAGNOSIS — N979 Female infertility, unspecified: Secondary | ICD-10-CM | POA: Diagnosis not present

## 2016-11-04 DIAGNOSIS — D250 Submucous leiomyoma of uterus: Secondary | ICD-10-CM | POA: Diagnosis not present

## 2016-11-04 DIAGNOSIS — Z319 Encounter for procreative management, unspecified: Secondary | ICD-10-CM | POA: Diagnosis not present

## 2016-11-04 DIAGNOSIS — Q51810 Arcuate uterus: Secondary | ICD-10-CM | POA: Diagnosis not present

## 2017-01-24 ENCOUNTER — Encounter (HOSPITAL_COMMUNITY): Payer: Self-pay | Admitting: *Deleted

## 2017-01-24 ENCOUNTER — Telehealth (HOSPITAL_COMMUNITY): Payer: Self-pay | Admitting: Emergency Medicine

## 2017-01-24 ENCOUNTER — Other Ambulatory Visit: Payer: Self-pay

## 2017-01-24 ENCOUNTER — Ambulatory Visit (HOSPITAL_COMMUNITY)
Admission: EM | Admit: 2017-01-24 | Discharge: 2017-01-24 | Disposition: A | Discharge status: Home / Self Care | Payer: BLUE CROSS/BLUE SHIELD | Attending: Internal Medicine | Admitting: Internal Medicine

## 2017-01-24 DIAGNOSIS — Z833 Family history of diabetes mellitus: Secondary | ICD-10-CM | POA: Insufficient documentation

## 2017-01-24 DIAGNOSIS — Z809 Family history of malignant neoplasm, unspecified: Secondary | ICD-10-CM | POA: Diagnosis not present

## 2017-01-24 DIAGNOSIS — N390 Urinary tract infection, site not specified: Secondary | ICD-10-CM | POA: Insufficient documentation

## 2017-01-24 DIAGNOSIS — Q512 Other doubling of uterus, unspecified: Secondary | ICD-10-CM | POA: Insufficient documentation

## 2017-01-24 DIAGNOSIS — N83209 Unspecified ovarian cyst, unspecified side: Secondary | ICD-10-CM | POA: Diagnosis not present

## 2017-01-24 DIAGNOSIS — N926 Irregular menstruation, unspecified: Secondary | ICD-10-CM | POA: Diagnosis not present

## 2017-01-24 DIAGNOSIS — N63 Unspecified lump in unspecified breast: Secondary | ICD-10-CM | POA: Insufficient documentation

## 2017-01-24 DIAGNOSIS — Z79899 Other long term (current) drug therapy: Secondary | ICD-10-CM | POA: Diagnosis not present

## 2017-01-24 DIAGNOSIS — Z8249 Family history of ischemic heart disease and other diseases of the circulatory system: Secondary | ICD-10-CM | POA: Diagnosis not present

## 2017-01-24 DIAGNOSIS — N898 Other specified noninflammatory disorders of vagina: Secondary | ICD-10-CM | POA: Diagnosis not present

## 2017-01-24 DIAGNOSIS — F17210 Nicotine dependence, cigarettes, uncomplicated: Secondary | ICD-10-CM | POA: Diagnosis not present

## 2017-01-24 DIAGNOSIS — N644 Mastodynia: Secondary | ICD-10-CM | POA: Diagnosis not present

## 2017-01-24 DIAGNOSIS — R110 Nausea: Secondary | ICD-10-CM | POA: Insufficient documentation

## 2017-01-24 DIAGNOSIS — Z3202 Encounter for pregnancy test, result negative: Secondary | ICD-10-CM

## 2017-01-24 DIAGNOSIS — K0889 Other specified disorders of teeth and supporting structures: Secondary | ICD-10-CM

## 2017-01-24 DIAGNOSIS — M545 Low back pain: Secondary | ICD-10-CM | POA: Diagnosis not present

## 2017-01-24 LAB — POCT URINALYSIS DIP (DEVICE)
Glucose, UA: NEGATIVE mg/dL
HGB URINE DIPSTICK: NEGATIVE
Ketones, ur: 15 mg/dL — AB
LEUKOCYTES UA: NEGATIVE
Nitrite: POSITIVE — AB
Protein, ur: 30 mg/dL — AB
Urobilinogen, UA: 0.2 mg/dL (ref 0.0–1.0)
pH: 5.5 (ref 5.0–8.0)

## 2017-01-24 LAB — POCT PREGNANCY, URINE: PREG TEST UR: NEGATIVE

## 2017-01-24 MED ORDER — AMOXICILLIN 500 MG PO CAPS: 500.0000 mg | ORAL_CAPSULE | Freq: Three times a day (TID) | ORAL | 0 refills | Status: AC

## 2017-01-24 MED ORDER — AMOXICILLIN 500 MG PO CAPS: 500.0000 mg | ORAL_CAPSULE | Freq: Three times a day (TID) | ORAL | 0 refills | Status: DC

## 2017-01-24 MED ORDER — NITROFURANTOIN MONOHYD MACRO 100 MG PO CAPS: 100.0000 mg | ORAL_CAPSULE | Freq: Two times a day (BID) | ORAL | 0 refills | Status: DC

## 2017-01-24 MED ORDER — NITROFURANTOIN MONOHYD MACRO 100 MG PO CAPS: 100.0000 mg | ORAL_CAPSULE | Freq: Two times a day (BID) | ORAL | 0 refills | Status: AC

## 2017-01-24 NOTE — Discharge Instructions (Signed)
Drink plenty of water to empty bladder regularly. Avoid alcohol and caffeine. Complete course of macrobid for UTI. Amoxicillin for dental pain, continue with naproxen twice a day, ice application. Please follow up with a dentist for definitive treatment of tooth.  If symptoms worsen or do not improve in the next week to return to be seen or to follow up with PCP.

## 2017-01-24 NOTE — ED Provider Notes (Signed)
[place]    CSN: [id number] Arrival date & time: 01/24/17  1529     History   Chief Complaint Chief Complaint  Patient presents with  . Abdominal Pain  . Vaginal Discharge  . Dental Pain    HPI [name] is a 36 y.o. female.   Vinisha presents with complaints of low abdominal pain, urinary frequency, pain with urination, vaginal discharge and left lower molar pain. She has had tooth pain for approximately a week. Abdominal pain for approximately 4-5 days. Without fevers. Mild low back pain. Rates pain 6/10. Without diarrhea, constipation. Mild nausea, without vomiting. Has been taking naproxen which has helped. Does not help with dental pain. Has not seen a dentist but is aware her tooth is "rotting." applied orajel to tooth. [name] 11/29, 1 sexual partner, does not use contdoms, no known exposure to STDs. Is not on birth control.    ROS per HPI.       Past Medical History:  Diagnosis Date  . Abscess   . BOILS, RECURRENT   . Breast tenderness in female   . BV (bacterial vaginosis)   . DENTAL PAIN   . Endometriosis   . FEMALE INFERTILITY   . Gardnerella vaginalis infection   . IRREGULAR MENSTRUATION   . Ovarian cyst   . SEPTATE UTERUS   . Viral labyrinthitis     Patient Active Problem List   Diagnosis Date Noted  . Breast mass in female 01/22/2013  . Cervical cancer screening 01/11/2013  . Screening for STD (sexually transmitted disease) 07/06/2012  . BV (bacterial vaginosis) 01/18/2012  . General counseling for prescription of oral contraceptives 11/06/2011  . Endometriosis 05/01/2011  . Breast tenderness in female 05/01/2011  . Ovarian cyst   . BOILS, RECURRENT 12/27/2009  . SEPTATE UTERUS 07/24/2009  . TOBACCO ABUSE 09/26/2008  . Female infertility 09/26/2008  . DENTAL PAIN 04/30/2008  . VIRAL LABYRINTHITIS 06/20/2007  . IRREGULAR MENSTRUATION 09/21/2006    Past Surgical History:  Procedure Laterality Date  . biopsy of right breast     . BREAST BIOPSY  1999   right  . OVARIAN CYST REMOVAL  Mar 25 2010  . OVARIAN CYST REMOVAL  03/25/2010   right    OB History    Gravida Para Term Preterm AB Living   0         0   SAB TAB Ectopic Multiple Live Births                   Home Medications    Prior to Admission medications   Medication Sig Start Date End Date Taking? Authorizing Provider  acetaminophen (TYLENOL) 325 MG tablet Take 650 mg by mouth every 6 (six) hours as needed.   Yes [provider]  amoxicillin (AMOXIL) 500 MG capsule Take 1 capsule (500 mg total) by mouth 3 (three) times daily for 7 days. 01/24/17 01/31/17  Zigmund Gottron, NP  nitrofurantoin, macrocrystal-monohydrate, (MACROBID) 100 MG capsule Take 1 capsule (100 mg total) by mouth 2 (two) times daily for 5 days. 01/24/17 01/29/17  Zigmund Gottron, NP    Family History Family History  Problem Relation Age of Onset  . Hypertension Mother   . Diabetes Mother   . Cancer Father   . Hypertension Father   . Diabetes Brother   . Hypertension Brother     Social History Social History   Tobacco Use  . Smoking status: Current Every Day Smoker  Packs/day: 0.50    Years: 10.00    Pack years: 5.00    Types: Cigarettes  . Smokeless tobacco: Never Used  Substance Use Topics  . Alcohol use: Yes    Comment: occasionally  . Drug use: No     Allergies   Patient has no known allergies.   Review of Systems Review of Systems   Physical Exam Triage Vital Signs ED Triage Vitals  Enc Vitals Group     BP 01/24/17 1604 109/71     Pulse Rate 01/24/17 1604 78     Resp 01/24/17 1604 16     Temp 01/24/17 1604 98.4 F (36.9 C)     Temp Source 01/24/17 1604 Oral     SpO2 01/24/17 1604 100 %     Weight --      Height --      Head Circumference --      Peak Flow --      Pain Score 01/24/17 1606 9     Pain Loc --      Pain Edu? --      Excl. in [place]? --    No data found.  Updated Vital Signs BP 109/71   Pulse 78   Temp  98.4 F (36.9 C) (Oral)   Resp 16   [name] 01/02/2017 (Exact Date)   SpO2 100%   Visual Acuity Right Eye Distance:   Left Eye Distance:   Bilateral Distance:    Right Eye Near:   Left Eye Near:    Bilateral Near:     Physical Exam  Constitutional: She is oriented to person, place, and time. She appears well-developed and well-nourished. No distress.  HENT:  Mouth/Throat: Abnormal dentition.    Cardiovascular: Normal rate, regular rhythm and normal heart sounds.  Pulmonary/Chest: Effort normal and breath sounds normal.  Abdominal: Soft. Bowel sounds are normal. There is tenderness in the suprapubic area. There is no rigidity, no rebound, no guarding, no CVA tenderness, no tenderness at McBurney's point and negative Murphy's sign.  Genitourinary: Cervix exhibits no motion tenderness and no discharge. Right adnexum displays no tenderness. Left adnexum displays no tenderness. No tenderness in the vagina. No vaginal discharge found.  Neurological: She is alert and oriented to person, place, and time.  Skin: Skin is warm and dry.     UC Treatments / Results  Labs (all labs ordered are listed, but only abnormal results are displayed) Labs Reviewed  POCT URINALYSIS DIP (DEVICE) - Abnormal; Notable for the following components:      Result Value   Bilirubin Urine SMALL (*)    Ketones, ur 15 (*)    Protein, ur 30 (*)    Nitrite POSITIVE (*)    All other components within normal limits  URINE CULTURE  POCT PREGNANCY, URINE  CERVICOVAGINAL ANCILLARY ONLY    EKG  EKG Interpretation None       Radiology No results found.  Procedures Procedures (including critical care time)  Medications Ordered in UC Medications - No data to display   Initial Impression / Assessment and Plan / UC Course  I have reviewed the triage vital signs and the nursing notes.  Pertinent labs & imaging results that were available during my care of the patient were reviewed by me and considered in  my medical decision making (see chart for details).     Urine culture to ensure adequate coverage. Vaginal swab for BV, yeast and Std's, exam inconsistent with BV at this time. Amoxicillin for  tooth pain, without obvious abscess currently. Continue with naproxen as needed, take with food. See a dentist, resource list provided. Will notify of any positive findings and if any changes to treatment are needed.  Patient verbalized understanding and agreeable to plan.    Final Clinical Impressions(s) / UC Diagnoses   Final diagnoses:  Urinary tract infection without hematuria, site unspecified  Pain, dental    ED Discharge Orders        Ordered    amoxicillin (AMOXIL) 500 MG capsule  3 times daily     01/24/17 1630    nitrofurantoin, macrocrystal-monohydrate, (MACROBID) 100 MG capsule  2 times daily     01/24/17 1630       Controlled Substance Prescriptions [place] Controlled Substance Registry consulted? Not Applicable   Zigmund Gottron, NP 01/24/17 1642

## 2017-01-24 NOTE — ED Triage Notes (Signed)
C/O low abd pain with vaginal discharge x 4-5 days without fevers.  C/O left lower toothache x 1 wk.  Has been taking Aleve, Tyl and Orajel.

## 2017-01-25 LAB — CERVICOVAGINAL ANCILLARY ONLY
Bacterial vaginitis: POSITIVE — AB
CHLAMYDIA, DNA PROBE: NEGATIVE
Candida vaginitis: NEGATIVE
NEISSERIA GONORRHEA: NEGATIVE
TRICH (WINDOWPATH): NEGATIVE

## 2017-01-25 LAB — URINE CULTURE

## 2017-01-27 MED ORDER — FLUCONAZOLE 150 MG PO TABS: 150.0000 mg | ORAL_TABLET | Freq: Every day | ORAL | 0 refills | Status: DC

## 2017-01-27 MED ORDER — METRONIDAZOLE 500 MG PO TABS: 500.0000 mg | ORAL_TABLET | Freq: Two times a day (BID) | ORAL | 0 refills | Status: DC

## 2017-01-27 NOTE — Telephone Encounter (Signed)
-----   Message from Sherlene Shams, MD sent at 01/26/2017 10:04 PM EST ----- Clinical staff, please let patient know that urine culture does not clearly demonstrate a UTI.  Other possible causes of urinary discomfort include chafing; irritation from hygiene product; other pelvic infection (yeast, bacterial vaginosis) or STD; occasional dietary cause (caffeine); bowel issue (constipation); low estrogen effect; kidney stone passage; or interstitial cystitis.   Please also let patient know that test for gardnerella (bacterial vaginosis) was positive.  This only needs to be treated if there are symptoms, such as vaginal irritation/discharge.  If these symptoms are present, ok to send rx for metronidazole 500mg  bid x 7d #14 no refills or metronidazole vaginal gel 8.11% 1 applicatorful bid x 7d #03 no refills.   Recheck for further evaluation if symptoms are not improving.  LM

## 2017-01-27 NOTE — Telephone Encounter (Signed)
Called pt to notify of recent lab results.... Pt ID'd properly Reports persistent vag d/c  Would like for [place] to call in Flagyl to [facility] ([place]) Also requested Diflucan... Per Lanelle Bal, ok to call in Diflucan 150 mg #1, no refills Sent to pharm.  Adv pt if sx are not getting better to return or to f/u w/PCP Education on safe sex given Notified pt that lab results can be obtained through MyChart Pt verb understanding.

## 2017-02-22 ENCOUNTER — Ambulatory Visit (HOSPITAL_COMMUNITY)
Admission: EM | Admit: 2017-02-22 | Discharge: 2017-02-22 | Disposition: A | Discharge status: Home / Self Care | Payer: BLUE CROSS/BLUE SHIELD | Attending: Internal Medicine | Admitting: Internal Medicine

## 2017-02-22 ENCOUNTER — Other Ambulatory Visit: Payer: Self-pay

## 2017-02-22 ENCOUNTER — Encounter (HOSPITAL_COMMUNITY): Payer: Self-pay | Admitting: Emergency Medicine

## 2017-02-22 DIAGNOSIS — R350 Frequency of micturition: Secondary | ICD-10-CM | POA: Diagnosis not present

## 2017-02-22 DIAGNOSIS — Z3202 Encounter for pregnancy test, result negative: Secondary | ICD-10-CM

## 2017-02-22 DIAGNOSIS — N898 Other specified noninflammatory disorders of vagina: Secondary | ICD-10-CM | POA: Diagnosis not present

## 2017-02-22 DIAGNOSIS — Z8249 Family history of ischemic heart disease and other diseases of the circulatory system: Secondary | ICD-10-CM | POA: Insufficient documentation

## 2017-02-22 DIAGNOSIS — R300 Dysuria: Secondary | ICD-10-CM | POA: Diagnosis not present

## 2017-02-22 DIAGNOSIS — Z833 Family history of diabetes mellitus: Secondary | ICD-10-CM | POA: Diagnosis not present

## 2017-02-22 DIAGNOSIS — Z87440 Personal history of urinary (tract) infections: Secondary | ICD-10-CM | POA: Insufficient documentation

## 2017-02-22 DIAGNOSIS — F17210 Nicotine dependence, cigarettes, uncomplicated: Secondary | ICD-10-CM | POA: Insufficient documentation

## 2017-02-22 LAB — POCT URINALYSIS DIP (DEVICE)
Bilirubin Urine: NEGATIVE
Glucose, UA: NEGATIVE mg/dL
HGB URINE DIPSTICK: NEGATIVE
Ketones, ur: NEGATIVE mg/dL
LEUKOCYTES UA: NEGATIVE
NITRITE: NEGATIVE
PH: 7 (ref 5.0–8.0)
PROTEIN: NEGATIVE mg/dL
SPECIFIC GRAVITY, URINE: 1.025 (ref 1.005–1.030)
UROBILINOGEN UA: 0.2 mg/dL (ref 0.0–1.0)

## 2017-02-22 LAB — POCT PREGNANCY, URINE: Preg Test, Ur: NEGATIVE

## 2017-02-22 NOTE — ED Triage Notes (Signed)
Pt states she feels like shes had to urinate a lot for the past few days. States a little pelvic pain.

## 2017-02-22 NOTE — Discharge Instructions (Signed)
Please wait for your results to complete in 2 days.  We will notify you of any results needing medications.  If you begin to have any fevers, increasing pain, urinary symptoms, vomiting, return to the clinic or emergency department.

## 2017-02-22 NOTE — ED Provider Notes (Signed)
[facility]    CSN: [id number] Arrival date & time: 02/22/17  1625     History   Chief Complaint Chief Complaint  Patient presents with  . Urinary Frequency    HPI [name] is a 37 y.o. female presents to the urgent care facility for evaluation of increased urinary frequency of last few days.  She also states she has had some abnormal vaginal discharge.  She denies any fevers, nausea, vomiting.  Does have some lower pelvic discomfort that is mild.  She denies any itching.  She states her last UTI was back in December 2018.  Denies any pain at this time.  Patient also requesting STD check as well as HIV testing.  HPI  Past Medical History:  Diagnosis Date  . Abscess   . BOILS, RECURRENT   . Breast tenderness in female   . BV (bacterial vaginosis)   . DENTAL PAIN   . Endometriosis   . FEMALE INFERTILITY   . Gardnerella vaginalis infection   . IRREGULAR MENSTRUATION   . Ovarian cyst   . SEPTATE UTERUS   . Viral labyrinthitis     Patient Active Problem List   Diagnosis Date Noted  . Breast mass in female 01/22/2013  . Cervical cancer screening 01/11/2013  . Screening for STD (sexually transmitted disease) 07/06/2012  . BV (bacterial vaginosis) 01/18/2012  . General counseling for prescription of oral contraceptives 11/06/2011  . Endometriosis 05/01/2011  . Breast tenderness in female 05/01/2011  . Ovarian cyst   . BOILS, RECURRENT 12/27/2009  . SEPTATE UTERUS 07/24/2009  . TOBACCO ABUSE 09/26/2008  . Female infertility 09/26/2008  . DENTAL PAIN 04/30/2008  . VIRAL LABYRINTHITIS 06/20/2007  . IRREGULAR MENSTRUATION 09/21/2006    Past Surgical History:  Procedure Laterality Date  . biopsy of right breast    . BREAST BIOPSY  1999   right  . OVARIAN CYST REMOVAL  Mar 25 2010  . OVARIAN CYST REMOVAL  03/25/2010   right    OB History    Gravida Para Term Preterm AB Living   0         0   SAB TAB Ectopic Multiple Live Births                    Home Medications    Prior to Admission medications   Medication Sig Start Date End Date Taking? Authorizing Provider  acetaminophen (TYLENOL) 325 MG tablet Take 650 mg by mouth every 6 (six) hours as needed.    [provider]  fluconazole (DIFLUCAN) 150 MG tablet Take 1 tablet (150 mg total) by mouth daily. Patient not taking: Reported on 02/22/2017 01/27/17   Augusto Gamble B, NP  metroNIDAZOLE (FLAGYL) 500 MG tablet Take 1 tablet (500 mg total) by mouth 2 (two) times daily. Patient not taking: Reported on 02/22/2017 01/27/17   Wynona Luna, MD    Family History Family History  Problem Relation Age of Onset  . Hypertension Mother   . Diabetes Mother   . Cancer Father   . Hypertension Father   . Diabetes Brother   . Hypertension Brother     Social History Social History   Tobacco Use  . Smoking status: Current Every Day Smoker    Packs/day: 0.50    Years: 10.00    Pack years: 5.00    Types: Cigarettes  . Smokeless tobacco: Never Used  Substance Use Topics  . Alcohol use: Yes    Comment: occasionally  .  Drug use: No     Allergies   Patient has no known allergies.   Review of Systems Review of Systems  Constitutional: Negative for fever.  Respiratory: Negative for shortness of breath.   Cardiovascular: Negative for chest pain.  Gastrointestinal: Negative for nausea and vomiting.  Genitourinary: Positive for frequency, pelvic pain (discomfort) and vaginal discharge. Negative for decreased urine volume, difficulty urinating, dysuria, urgency, vaginal bleeding and vaginal pain.  Musculoskeletal: Negative for myalgias.  Skin: Negative for rash.  Neurological: Negative for dizziness and headaches.     Physical Exam Triage Vital Signs ED Triage Vitals  Enc Vitals Group     BP 02/22/17 1648 105/62     Pulse Rate 02/22/17 1648 87     Resp 02/22/17 1648 18     Temp 02/22/17 1648 98.4 F (36.9 C)     Temp src --      SpO2 02/22/17 1648 100  %     Weight --      Height --      Head Circumference --      Peak Flow --      Pain Score 02/22/17 1649 0     Pain Loc --      Pain Edu? --      Excl. in [place]? --    No data found.  Updated Vital Signs BP 105/62   Pulse 87   Temp 98.4 F (36.9 C)   Resp 18   [name] 01/24/2017   SpO2 100%   Visual Acuity Right Eye Distance:   Left Eye Distance:   Bilateral Distance:    Right Eye Near:   Left Eye Near:    Bilateral Near:     Physical Exam  Constitutional: She is oriented to person, place, and time. She appears well-developed and well-nourished.  HENT:  Head: Normocephalic and atraumatic.  Eyes: Conjunctivae are normal.  Neck: Normal range of motion.  Cardiovascular: Normal rate.  Pulmonary/Chest: Effort normal. No respiratory distress.  Abdominal: Soft. She exhibits no distension and no mass. There is no tenderness. There is no guarding.  Genitourinary: Vaginal discharge found.  Genitourinary Comments: External vaginal exam shows no lesions.  Speculum was placed and there is some white vaginal discharge.  Cervix does not appear to be inflamed with no cervical drainage.  There is no cervical motion tenderness.  Adnexa is soft and nontender.  Neurological: She is alert and oriented to person, place, and time.  Skin: Skin is warm. No rash noted.  Psychiatric: She has a normal mood and affect. Her behavior is normal. Thought content normal.     UC Treatments / Results  Labs (all labs ordered are listed, but only abnormal results are displayed) Labs Reviewed  URINE CULTURE  HIV ANTIBODY (ROUTINE TESTING)  RPR  POCT URINALYSIS DIP (DEVICE)  POCT PREGNANCY, URINE  CERVICOVAGINAL ANCILLARY ONLY    EKG  EKG Interpretation None       Radiology No results found.  Procedures Procedures (including critical care time)  Medications Ordered in UC Medications - No data to display   Initial Impression / Assessment and Plan / UC Course  I have reviewed the triage  vital signs and the nursing notes.  Pertinent labs & imaging results that were available during my care of the patient were reviewed by me and considered in my medical decision making (see chart for details).     37 year old female with increase in urinary frequency for a few days along with abnormal vaginal  discharge.  Urinalysis negative for UTI.  Urine culture is ordered.  Due to abnormal vaginal discharge wet prep along with gonorrhea chlamydia test is performed.  Patient understands it will take 2 days to get results.  We discussed going ahead and starting patient on antibiotics but the patient elected to wait until we know the results before we start any medications.  Patient understands signs and symptoms return to the clinic for.  Final Clinical Impressions(s) / UC Diagnoses   Final diagnoses:  Dysuria  Vaginal discharge    ED Discharge Orders    None         Duanne Guess, [place] 02/22/17 1806

## 2017-02-23 LAB — HIV ANTIBODY (ROUTINE TESTING W REFLEX): HIV Screen 4th Generation wRfx: NONREACTIVE

## 2017-02-23 LAB — CERVICOVAGINAL ANCILLARY ONLY
BACTERIAL VAGINITIS: POSITIVE — AB
CANDIDA VAGINITIS: NEGATIVE
CHLAMYDIA, DNA PROBE: NEGATIVE
NEISSERIA GONORRHEA: NEGATIVE
TRICH (WINDOWPATH): NEGATIVE

## 2017-02-23 LAB — RPR: RPR Ser Ql: NONREACTIVE

## 2017-02-24 ENCOUNTER — Telehealth (HOSPITAL_COMMUNITY): Payer: Self-pay | Admitting: Internal Medicine

## 2017-02-24 MED ORDER — METRONIDAZOLE 500 MG PO TABS
500.0000 mg | ORAL_TABLET | Freq: Two times a day (BID) | ORAL | 0 refills | Status: DC
Start: 2017-02-24 — End: 2017-06-14

## 2017-02-24 NOTE — Telephone Encounter (Signed)
Please let patient know that test for gardnerella (bacterial vaginosis) was positive.  Reported vaginal discharge at urgent care visit, will send rx metronidazole to the pharmacy of record, [facility] on [address].  Recheck for further evaluation if symptoms are not improving.  LM

## 2017-03-17 ENCOUNTER — Encounter (HOSPITAL_COMMUNITY): Payer: Self-pay | Admitting: *Deleted

## 2017-03-17 ENCOUNTER — Ambulatory Visit (HOSPITAL_COMMUNITY)
Admission: EM | Admit: 2017-03-17 | Discharge: 2017-03-17 | Disposition: A | Discharge status: Home / Self Care | Payer: BLUE CROSS/BLUE SHIELD | Attending: Family Medicine | Admitting: Family Medicine

## 2017-03-17 DIAGNOSIS — F17210 Nicotine dependence, cigarettes, uncomplicated: Secondary | ICD-10-CM | POA: Diagnosis not present

## 2017-03-17 DIAGNOSIS — R69 Illness, unspecified: Secondary | ICD-10-CM

## 2017-03-17 DIAGNOSIS — J111 Influenza due to unidentified influenza virus with other respiratory manifestations: Secondary | ICD-10-CM

## 2017-03-17 DIAGNOSIS — N898 Other specified noninflammatory disorders of vagina: Secondary | ICD-10-CM

## 2017-03-17 MED ORDER — HYDROCODONE-HOMATROPINE 5-1.5 MG/5ML PO SYRP: 5.0000 mL | ORAL_SOLUTION | Freq: Four times a day (QID) | ORAL | 0 refills | Status: DC | PRN

## 2017-03-17 NOTE — ED Provider Notes (Signed)
[facility]   [id number] 03/17/17 Arrival Time: 3875  ASSESSMENT & PLAN:  1. Influenza-like illness   2. Vaginal discharge     Meds ordered this encounter  Medications  . HYDROcodone-homatropine (HYCODAN) 5-1.5 MG/5ML syrup    Sig: Take 5 mLs by mouth every 6 (six) hours as needed for cough.    Dispense:  90 mL    Refill:  0   Urine cytology sent. No empiric treatment. Will await results.  Work note given. Cough medication sedation precautions. Discussed typical duration of symptoms. OTC symptom care as needed. Ensure adequate fluid intake and rest. May f/u with PCP or here as needed.  Reviewed expectations re: course of current medical issues. Questions answered. Outlined signs and symptoms indicating need for more acute intervention. Patient verbalized understanding. After Visit Summary given.   SUBJECTIVE: History from: patient.  [name] is a 37 y.o. female who presents with complaint of nasal congestion, post-nasal drainage, and a persistent dry cough. Onset abrupt, approximately 2 days ago. Overall fatigued with body aches. SOB: none. Wheezing: none. Fever: yes, subjective. Overall normal PO intake without n/v. Sick contacts: no. OTC treatment: Theraflu without much relief.  Also requests STD screening. Mild thin and clear vaginal discharge over the past 4 days. No pelvic pain or urinary symptoms. Treated for BV within the past few weeks. Finished Flagyl. Sexually active with one female partner; occasional condom use. Patient's last menstrual period was 03/05/2017.  Received flu shot this year: no.  Social History   Tobacco Use  Smoking Status Current Every Day Smoker  . Packs/day: 0.50  . Years: 10.00  . Pack years: 5.00  . Types: Cigarettes  Smokeless Tobacco Never Used    ROS: As per HPI.   OBJECTIVE:  Vitals:   03/17/17 1009  Pulse: 90  Resp: 16  Temp: 98.2 F (36.8 C)  TempSrc: Oral  SpO2: 100%    General appearance: alert;  appears fatigued HEENT: nasal congestion; clear runny nose; throat irritation secondary to post-nasal drainage Neck: supple without LAD Lungs: unlabored respirations, symmetrical air entry; cough: moderate; no respiratory distress Abd: soft; non-distended GU: declines Skin: warm and dry Psychological: alert and cooperative; normal mood and affect   No Known Allergies  Past Medical History:  Diagnosis Date  . Abscess   . BOILS, RECURRENT   . Breast tenderness in female   . BV (bacterial vaginosis)   . DENTAL PAIN   . Endometriosis   . FEMALE INFERTILITY   . Gardnerella vaginalis infection   . IRREGULAR MENSTRUATION   . Ovarian cyst   . SEPTATE UTERUS   . Viral labyrinthitis    Family History  Problem Relation Age of Onset  . Hypertension Mother   . Diabetes Mother   . Cancer Father   . Hypertension Father   . Diabetes Brother   . Hypertension Brother    Social History   Socioeconomic History  . Marital status: Single    Spouse name: Not on file  . Number of children: Not on file  . Years of education: Not on file  . Highest education level: Not on file  Social Needs  . Financial resource strain: Not on file  . Food insecurity - worry: Not on file  . Food insecurity - inability: Not on file  . Transportation needs - medical: Not on file  . Transportation needs - non-medical: Not on file  Occupational History  . Not on file  Tobacco Use  . Smoking  status: Current Every Day Smoker    Packs/day: 0.50    Years: 10.00    Pack years: 5.00    Types: Cigarettes  . Smokeless tobacco: Never Used  Substance and Sexual Activity  . Alcohol use: Yes    Comment: occasionally  . Drug use: No  . Sexual activity: Yes    Birth control/protection: Condom  Other Topics Concern  . Not on file  Social History Narrative  . Not on file           Vanessa Kick, MD 03/17/17 1027

## 2017-03-17 NOTE — ED Triage Notes (Signed)
Patient states that she missed work yesterday and today for bodyaches and chills. States she thinks she has the flu. Has taken theraflu with no relief.   Patient would also like testing for STDs.   Patient also needs work note for yesterday and today.

## 2017-03-17 NOTE — Discharge Instructions (Addendum)

## 2017-03-18 LAB — URINE CYTOLOGY ANCILLARY ONLY
CHLAMYDIA, DNA PROBE: NEGATIVE
Neisseria Gonorrhea: NEGATIVE
Trichomonas: NEGATIVE

## 2017-03-19 LAB — URINE CYTOLOGY ANCILLARY ONLY
BACTERIAL VAGINITIS: POSITIVE — AB
Candida vaginitis: NEGATIVE

## 2017-04-05 ENCOUNTER — Encounter (HOSPITAL_COMMUNITY): Payer: Self-pay

## 2017-04-05 ENCOUNTER — Emergency Department (HOSPITAL_COMMUNITY)
Admission: EM | Admit: 2017-04-05 | Discharge: 2017-04-05 | Disposition: A | Discharge status: Home / Self Care | Payer: BLUE CROSS/BLUE SHIELD | Attending: Emergency Medicine | Admitting: Emergency Medicine

## 2017-04-05 ENCOUNTER — Ambulatory Visit (HOSPITAL_COMMUNITY)
Admission: EM | Admit: 2017-04-05 | Discharge: 2017-04-05 | Disposition: A | Discharge status: Home / Self Care | Payer: BLUE CROSS/BLUE SHIELD | Source: Home / Self Care | Attending: Family Medicine | Admitting: Family Medicine

## 2017-04-05 ENCOUNTER — Encounter (HOSPITAL_COMMUNITY): Payer: Self-pay | Admitting: Emergency Medicine

## 2017-04-05 DIAGNOSIS — K0889 Other specified disorders of teeth and supporting structures: Secondary | ICD-10-CM

## 2017-04-05 DIAGNOSIS — F17210 Nicotine dependence, cigarettes, uncomplicated: Secondary | ICD-10-CM | POA: Diagnosis not present

## 2017-04-05 DIAGNOSIS — Z793 Long term (current) use of hormonal contraceptives: Secondary | ICD-10-CM | POA: Diagnosis not present

## 2017-04-05 DIAGNOSIS — H9202 Otalgia, left ear: Secondary | ICD-10-CM | POA: Insufficient documentation

## 2017-04-05 DIAGNOSIS — R51 Headache: Secondary | ICD-10-CM | POA: Diagnosis not present

## 2017-04-05 MED ORDER — BUPIVACAINE-EPINEPHRINE (PF) 0.5% -1:200000 IJ SOLN
1.8000 mL | Freq: Once | INTRAMUSCULAR | Status: AC
  Administered 2017-04-05: 1.8 mL
  Filled 2017-04-05: qty 1.8

## 2017-04-05 MED ORDER — ONDANSETRON 4 MG PO TBDP
4.0000 mg | ORAL_TABLET | Freq: Once | ORAL | Status: AC
  Administered 2017-04-05: 4 mg via ORAL
  Filled 2017-04-05: qty 1

## 2017-04-05 MED ORDER — BENZOCAINE 20 % MT AERO
INHALATION_SPRAY | Freq: Once | OROMUCOSAL | Status: AC
  Administered 2017-04-05: 20:00:00 via OROMUCOSAL
  Filled 2017-04-05: qty 57

## 2017-04-05 MED ORDER — HYDROCODONE-ACETAMINOPHEN 5-325 MG PO TABS: 1.0000 | ORAL_TABLET | Freq: Four times a day (QID) | ORAL | 0 refills | Status: DC | PRN

## 2017-04-05 MED ORDER — HYDROCODONE-ACETAMINOPHEN 10-325 MG PO TABS
1.0000 | ORAL_TABLET | Freq: Once | ORAL | Status: AC
  Administered 2017-04-05: 1 via ORAL
  Filled 2017-04-05: qty 1

## 2017-04-05 MED ORDER — AMOXICILLIN-POT CLAVULANATE 875-125 MG PO TABS: 1.0000 | ORAL_TABLET | Freq: Two times a day (BID) | ORAL | 0 refills | Status: DC

## 2017-04-05 MED ORDER — IBUPROFEN 600 MG PO TABS: 600.0000 mg | ORAL_TABLET | Freq: Four times a day (QID) | ORAL | 0 refills | Status: DC | PRN

## 2017-04-05 NOTE — ED Provider Notes (Signed)
Dental Block Date/Time: 04/05/2017 11:11 PM Performed by: Duffy Bruce, MD Authorized by: Duffy Bruce, MD   Consent:    Consent obtained:  Verbal   Consent given by:  Patient   Risks discussed:  Allergic reaction, hematoma, infection, intravascular injection, pain, swelling and unsuccessful block   Alternatives discussed:  Alternative treatment Indications:    Indications: dental pain   Location:    Block type:  Inferior alveolar   Laterality:  Left Procedure details (see MAR for exact dosages):    Topical anesthetic:  Benzocaine spray   Needle gauge:  27 G   Anesthetic injected:  Bupivacaine 0.25% WITH epi   Injection procedure:  Anatomic landmarks identified, anatomic landmarks palpated, introduced needle, negative aspiration for blood and incremental injection Post-procedure details:    Outcome:  Anesthesia achieved   Patient tolerance of procedure:  Tolerated well, no immediate complications      Duffy Bruce, MD 04/05/17 2312

## 2017-04-05 NOTE — ED Triage Notes (Signed)
Pt here for facial swelling on the left side of her face. [name] it started with tooth pain and was given amoxicillin and after she finished it the pain came back and the swelling began. Has tried salt rinses and tylenol with no relief. No fever. Reported

## 2017-04-05 NOTE — Discharge Instructions (Signed)
Please schedule a dental follow up as soon as possible.

## 2017-04-05 NOTE — ED Provider Notes (Addendum)
[facility] DEPT Provider Note   CSN: [id number] Arrival date & time: 04/05/17  1625     History   Chief Complaint Chief Complaint  Patient presents with  . Dental Pain  . Facial Swelling    HPI [name] is a 37 y.o. female.  HPI   Pt is a 37 y/o female presenting to the ED today c/o 10/10 dental pain to the left lower mouth that began about 4-5 days ago. Has gotten worse since onset. States that she began to have swelling to the left lower jaw/mouth last night. She was seen at urgent this morning and was started on Augmentin and was given hydrocodone to go home with. States she took hydrocodone at 11 and pain was no better. Has taken one dose of augmentin. States that since she left urgent care swelling has gotten worse and pain has gotten worse.    States she is having sweats and chills. States that it hurts to swallow, but she is not having difficulty swallowing. Also c/o left ear pain and a headache.   Past Medical History:  Diagnosis Date  . Abscess   . BOILS, RECURRENT   . Breast tenderness in female   . BV (bacterial vaginosis)   . DENTAL PAIN   . Endometriosis   . FEMALE INFERTILITY   . Gardnerella vaginalis infection   . IRREGULAR MENSTRUATION   . Ovarian cyst   . SEPTATE UTERUS   . Viral labyrinthitis     Patient Active Problem List   Diagnosis Date Noted  . Breast mass in female 01/22/2013  . Cervical cancer screening 01/11/2013  . Screening for STD (sexually transmitted disease) 07/06/2012  . BV (bacterial vaginosis) 01/18/2012  . General counseling for prescription of oral contraceptives 11/06/2011  . Endometriosis 05/01/2011  . Breast tenderness in female 05/01/2011  . Ovarian cyst   . BOILS, RECURRENT 12/27/2009  . SEPTATE UTERUS 07/24/2009  . TOBACCO ABUSE 09/26/2008  . Female infertility 09/26/2008  . DENTAL PAIN 04/30/2008  . VIRAL LABYRINTHITIS 06/20/2007  . IRREGULAR MENSTRUATION 09/21/2006    Past  Surgical History:  Procedure Laterality Date  . biopsy of right breast    . BREAST BIOPSY  1999   right  . OVARIAN CYST REMOVAL  Mar 25 2010  . OVARIAN CYST REMOVAL  03/25/2010   right    OB History    Gravida Para Term Preterm AB Living   0         0   SAB TAB Ectopic Multiple Live Births                   Home Medications    Prior to Admission medications   Medication Sig Start Date End Date Taking? Authorizing Provider  acetaminophen (TYLENOL) 325 MG tablet Take 650 mg by mouth every 6 (six) hours as needed.    [provider]  amoxicillin-clavulanate (AUGMENTIN) 875-125 MG tablet Take 1 tablet by mouth every 12 (twelve) hours. 04/05/17   Vanessa Kick, MD  HYDROcodone-acetaminophen (NORCO/VICODIN) 5-325 MG tablet Take 1 tablet by mouth every 6 (six) hours as needed for moderate pain or severe pain. 04/05/17   Vanessa Kick, MD  HYDROcodone-homatropine [facility]) 5-1.5 MG/5ML syrup Take 5 mLs by mouth every 6 (six) hours as needed for cough. 03/17/17   Vanessa Kick, MD  ibuprofen (ADVIL,MOTRIN) 600 MG tablet Take 1 tablet (600 mg total) by mouth every 6 (six) hours as needed. 04/05/17   Emili Mcloughlin S,  PA-C  metroNIDAZOLE (FLAGYL) 500 MG tablet Take 1 tablet (500 mg total) by mouth 2 (two) times daily. For positive BV test 02/24/17   Wynona Luna, MD  Norgestimate-Ethinyl Estradiol Triphasic (TRI-SPRINTEC) 0.18/0.215/0.25 MG-35 MCG tablet Take 1 tablet by mouth daily. 11/03/10 05/01/11  Woodroe Mode, MD    Family History Family History  Problem Relation Age of Onset  . Hypertension Mother   . Diabetes Mother   . Cancer Father   . Hypertension Father   . Diabetes Brother   . Hypertension Brother     Social History Social History   Tobacco Use  . Smoking status: Current Every Day Smoker    Packs/day: 0.50    Years: 10.00    Pack years: 5.00    Types: Cigarettes  . Smokeless tobacco: Never Used  Substance Use Topics  . Alcohol use: Yes    Comment:  occasionally  . Drug use: No     Allergies   Patient has no known allergies.   Review of Systems Review of Systems  Constitutional: Positive for chills.  HENT: Positive for ear pain.        Dental pain, facial swelling, painful swallowing, no difficulty swallowing  Eyes: Negative for visual disturbance.  Respiratory: Negative for shortness of breath.   Cardiovascular: Negative for chest pain.  Gastrointestinal: Negative for vomiting.  Musculoskeletal: Negative for neck pain and neck stiffness.  Neurological: Positive for headaches.     Physical Exam Updated Vital Signs BP 126/79   Pulse 72   Temp 98.3 F (36.8 C) (Oral)   Resp 16   [name] 04/05/2017 (Exact Date)   SpO2 100%   Physical Exam  Constitutional: She is oriented to person, place, and time. She appears well-developed and well-nourished.  Tearful on exam  HENT:  Head: Normocephalic and atraumatic.  Right Ear: External ear normal.  Left Ear: External ear normal.  TMs normal.  No pharyngeal erythema or tonsillar swelling.  Uvula midline.  No evidence of PTA or retropharyngeal abscess.  Normal voice.  Patient has crown to tooth left lower molar and missing tooth anterior to this.  There is no obvious dental abscess along the gums.  No trismus.  No stridor no drooling.  No swelling to the neck or floor of the mouth.  No raised tongue.  No woody appearance to the floor of the mouth.  There is swelling to the left side of the chin and to the left cheek.   Area is tender to touch. No significant submandibular swelling or erythema to the face.  Eyes: Conjunctivae and EOM are normal. Pupils are equal, round, and reactive to light.  Neck: Normal range of motion. Neck supple.  No nuchal rigidity  Cardiovascular: Normal rate and normal heart sounds.  Pulmonary/Chest: Effort normal and breath sounds normal.  Musculoskeletal: Normal range of motion.  Lymphadenopathy:    She has no cervical adenopathy.  Neurological: She is  alert and oriented to person, place, and time. No cranial nerve deficit.  Skin: Skin is warm and dry.  Psychiatric: She has a normal mood and affect.  Nursing note and vitals reviewed.    ED Treatments / Results  Labs (all labs ordered are listed, but only abnormal results are displayed) Labs Reviewed - No data to display  EKG  EKG Interpretation None       Radiology No results found.  Procedures Procedures (including critical care time)  Medications Ordered in ED Medications  HYDROcodone-acetaminophen (NORCO) 10-325 MG per  tablet 1 tablet (1 tablet Oral Given 04/05/17 1843)  ondansetron (ZOFRAN-ODT) disintegrating tablet 4 mg (4 mg Oral Given 04/05/17 1835)  bupivacaine-epinephrine (MARCAINE W/ EPI) 0.5% -1:200000 injection 1.8 mL (1.8 mLs Infiltration Given by Other 04/05/17 1939)  bupivacaine-epinephrine (MARCAINE W/ EPI) 0.5% -1:200000 injection 1.8 mL (1.8 mLs Infiltration Given by Other 04/05/17 1939)  Benzocaine (HURRCAINE) 20 % mouth spray ( Mouth/Throat Given by Other 04/05/17 1938)     Initial Impression / Assessment and Plan / ED Course  I have reviewed the triage vital signs and the nursing notes.  Pertinent labs & imaging results that were available during my care of the patient were reviewed by me and considered in my medical decision making (see chart for details).    Discussed pt presentation and exam findings with Dr. Ellender Hose, who evaluated the pt and does not feel that she needs imaging. Feels exam is unconcerning for Ludwig's angina. He completed dental block and pt reports mild improvement of sxs. He agrees with the plan to d/c with continued abx, pain med and dental f/u.   Final Clinical Impressions(s) / ED Diagnoses   Final diagnoses:  Pain, dental   Patient with toothache.  No gross abscess.  Exam unconcerning for Ludwig's angina or spread of infection.  Given abx earlier today at urgent care. advised to continue these abx and return for worsening sxs.   Urged patient to follow-up with dentist.  Resources given. Pt and family understand reasons to return and have no further questions.    ED Discharge Orders        Ordered    ibuprofen (ADVIL,MOTRIN) 600 MG tablet  Every 6 hours PRN     04/05/17 2031       Bishop Dublin 04/05/17 2042    Duffy Bruce, MD 04/05/17 2311    Tivis Ringer, Suleima Ohlendorf S, PA-C 04/06/17 1134    Duffy Bruce, MD 04/06/17 [phone]

## 2017-04-05 NOTE — ED Provider Notes (Signed)
[place]   [id number] 04/05/17 Arrival Time: 6644  ASSESSMENT & PLAN:  1. Pain, dental   vs early parotitis  Meds ordered this encounter  Medications  . amoxicillin-clavulanate (AUGMENTIN) 875-125 MG tablet    Sig: Take 1 tablet by mouth every 12 (twelve) hours.    Dispense:  14 tablet    Refill:  0  . HYDROcodone-acetaminophen (NORCO/VICODIN) 5-325 MG tablet    Sig: Take 1 tablet by mouth every 6 (six) hours as needed for moderate pain or severe pain.    Dispense:  10 tablet    Refill:  0   [place] Controlled Substances Registry consulted for this patient. I feel the risk/benefit ratio today is favorable for proceeding with this prescription for a controlled substance. Medication sedation precautions given.  Dental resource written instructions given. She will schedule dental evaluation as soon as possible.  Work note given. May f/u here if needed. Reviewed expectations re: course of current medical issues. Questions answered. Outlined signs and symptoms indicating need for more acute intervention. Patient verbalized understanding. After Visit Summary given.   SUBJECTIVE:  [name] is a 37 y.o. female who reports gradual onset of left lower dental pain along with some swelling of her L cheek described as aching. Present for approx 3-4 days. Afebrile. Tolerating PO intake but reports much pain with chewing. Normal swallowing. She does not see a dentist regularly. No neck swelling or pain. OTC analgesics without relief.  ROS: As per HPI.  OBJECTIVE:  Vitals:   04/05/17 1008  BP: (!) 148/88  Pulse: 76  Resp: (!) 24  Temp: 98.6 F (37 C)  TempSrc: Oral  SpO2: 99%    General appearance: alert; crying secondary to complaints as above HENT: normocephalic; atraumatic; dentition: poor; gingival hypertrophy over left lower gums without areas of fluctuance; slight swelling of L cheek that is tender to touch Neck: supple without LAD Lungs: normal  respirations Skin: warm and dry Psychological: alert and cooperative; normal mood and affect  No Known Allergies  Past Medical History:  Diagnosis Date  . Abscess   . BOILS, RECURRENT   . Breast tenderness in female   . BV (bacterial vaginosis)   . DENTAL PAIN   . Endometriosis   . FEMALE INFERTILITY   . Gardnerella vaginalis infection   . IRREGULAR MENSTRUATION   . Ovarian cyst   . SEPTATE UTERUS   . Viral labyrinthitis    Social History   Socioeconomic History  . Marital status: Single    Spouse name: Not on file  . Number of children: Not on file  . Years of education: Not on file  . Highest education level: Not on file  Social Needs  . Financial resource strain: Not on file  . Food insecurity - worry: Not on file  . Food insecurity - inability: Not on file  . Transportation needs - medical: Not on file  . Transportation needs - non-medical: Not on file  Occupational History  . Not on file  Tobacco Use  . Smoking status: Current Every Day Smoker    Packs/day: 0.50    Years: 10.00    Pack years: 5.00    Types: Cigarettes  . Smokeless tobacco: Never Used  Substance and Sexual Activity  . Alcohol use: Yes    Comment: occasionally  . Drug use: No  . Sexual activity: Yes    Birth control/protection: Condom  Other Topics Concern  . Not on file  Social History Narrative  .  Not on file   Family History  Problem Relation Age of Onset  . Hypertension Mother   . Diabetes Mother   . Cancer Father   . Hypertension Father   . Diabetes Brother   . Hypertension Brother    Past Surgical History:  Procedure Laterality Date  . biopsy of right breast    . BREAST BIOPSY  1999   right  . OVARIAN CYST REMOVAL  Mar 25 2010  . OVARIAN CYST REMOVAL  03/25/2010   right     Vanessa Kick, MD 04/05/17 1026

## 2017-04-05 NOTE — ED Triage Notes (Signed)
Patient c/o left facial swelling and dental pain x 3 days. Reports was seen at [facility] today and given antibiotics and pain meds which havent helped.

## 2017-04-05 NOTE — Discharge Instructions (Signed)
You need to continue the antibiotics that you were given in urgent care earlier today.  You will also need to continue the pain medication that you were given at that visit as well.  You may use ibuprofen in addition to the pain medication that you were given earlier today at urgent care.  Please follow-up with a dentist as discussed.  You were given resources for a dentist today.  Follow-up with primary care if you can within 1 week for reevaluation of your symptoms.  Return to the emergency department immediately if the swelling continues to worsen, you are unable to open your mouth any further than you are today, you have any fevers, swelling below your tongue, or any new or worsening symptoms.

## 2017-04-06 ENCOUNTER — Other Ambulatory Visit: Payer: Self-pay

## 2017-04-06 ENCOUNTER — Emergency Department (HOSPITAL_COMMUNITY)
Admission: EM | Admit: 2017-04-06 | Discharge: 2017-04-06 | Disposition: A | Discharge status: Home / Self Care | Payer: BLUE CROSS/BLUE SHIELD | Attending: Emergency Medicine | Admitting: Emergency Medicine

## 2017-04-06 ENCOUNTER — Encounter (HOSPITAL_COMMUNITY): Payer: Self-pay | Admitting: Emergency Medicine

## 2017-04-06 DIAGNOSIS — Z79899 Other long term (current) drug therapy: Secondary | ICD-10-CM | POA: Diagnosis not present

## 2017-04-06 DIAGNOSIS — K047 Periapical abscess without sinus: Secondary | ICD-10-CM | POA: Diagnosis not present

## 2017-04-06 DIAGNOSIS — R220 Localized swelling, mass and lump, head: Secondary | ICD-10-CM

## 2017-04-06 DIAGNOSIS — K122 Cellulitis and abscess of mouth: Secondary | ICD-10-CM | POA: Diagnosis not present

## 2017-04-06 DIAGNOSIS — F17210 Nicotine dependence, cigarettes, uncomplicated: Secondary | ICD-10-CM | POA: Diagnosis not present

## 2017-04-06 DIAGNOSIS — M272 Inflammatory conditions of jaws: Secondary | ICD-10-CM | POA: Diagnosis not present

## 2017-04-06 MED ORDER — OXYCODONE-ACETAMINOPHEN 5-325 MG PO TABS
1.0000 | ORAL_TABLET | Freq: Once | ORAL | Status: AC
  Administered 2017-04-06: 1 via ORAL
  Filled 2017-04-06: qty 1

## 2017-04-06 NOTE — Discharge Instructions (Signed)
The definitive treatment for your condition is dental extraction. Please see Dr. Hoyt Koch at 1 pm today in his clinic.

## 2017-04-06 NOTE — ED Triage Notes (Signed)
Pt reports swelling to left lower jaw that is reported to be worse than when she was seen yesterday. Pt reports to still be taking antibiotics with no relief. Pt reports swelling got worse after having injection yesterday to numb pain.

## 2017-04-06 NOTE — ED Provider Notes (Signed)
[facility] DEPT Provider Note   CSN: [id number] Arrival date & time: 04/06/17  0411     History   Chief Complaint Chief Complaint  Patient presents with  . Facial Swelling    HPI [name] is a 37 y.o. female.  HPI  37 year old female with history of poor dentition comes in with chief complaint of facial pain and swelling.  Patient states that she started having a dental infection few days ago, she has been in the ER 2 times for the same complaint over the last 2 days and was started on Augmentin.  Patient has not called a dentist for a follow-up.  Patient states that over the past 12 hours her pain is gotten more significant, and swelling has worsened.  Patient was seen in the ER yesterday and had a dental block.  Patient denies any associated fevers, nausea, vomiting, difficulty opening mouth, heat or cold sensitivity, difficulty in breathing, severe headaches, altered mental status.  Patient does not have any significant medical history and she is immunocompetent.  Past Medical History:  Diagnosis Date  . Abscess   . BOILS, RECURRENT   . Breast tenderness in female   . BV (bacterial vaginosis)   . DENTAL PAIN   . Endometriosis   . FEMALE INFERTILITY   . Gardnerella vaginalis infection   . IRREGULAR MENSTRUATION   . Ovarian cyst   . SEPTATE UTERUS   . Viral labyrinthitis     Patient Active Problem List   Diagnosis Date Noted  . Breast mass in female 01/22/2013  . Cervical cancer screening 01/11/2013  . Screening for STD (sexually transmitted disease) 07/06/2012  . BV (bacterial vaginosis) 01/18/2012  . General counseling for prescription of oral contraceptives 11/06/2011  . Endometriosis 05/01/2011  . Breast tenderness in female 05/01/2011  . Ovarian cyst   . BOILS, RECURRENT 12/27/2009  . SEPTATE UTERUS 07/24/2009  . TOBACCO ABUSE 09/26/2008  . Female infertility 09/26/2008  . DENTAL PAIN 04/30/2008  . VIRAL LABYRINTHITIS  06/20/2007  . IRREGULAR MENSTRUATION 09/21/2006    Past Surgical History:  Procedure Laterality Date  . biopsy of right breast    . BREAST BIOPSY  1999   right  . OVARIAN CYST REMOVAL  Mar 25 2010  . OVARIAN CYST REMOVAL  03/25/2010   right    OB History    Gravida Para Term Preterm AB Living   0         0   SAB TAB Ectopic Multiple Live Births                   Home Medications    Prior to Admission medications   Medication Sig Start Date End Date Taking? Authorizing Provider  amoxicillin-clavulanate (AUGMENTIN) 875-125 MG tablet Take 1 tablet by mouth every 12 (twelve) hours. 04/05/17  Yes Vanessa Kick, MD  HYDROcodone-acetaminophen (NORCO/VICODIN) 5-325 MG tablet Take 1 tablet by mouth every 6 (six) hours as needed for moderate pain or severe pain. 04/05/17  Yes Vanessa Kick, MD  metroNIDAZOLE (FLAGYL) 500 MG tablet Take 1 tablet (500 mg total) by mouth 2 (two) times daily. For positive BV test 02/24/17  Yes Wynona Luna, MD  HYDROcodone-homatropine [facility]) 5-1.5 MG/5ML syrup Take 5 mLs by mouth every 6 (six) hours as needed for cough. Patient not taking: Reported on 04/06/2017 03/17/17   Vanessa Kick, MD  ibuprofen (ADVIL,MOTRIN) 600 MG tablet Take 1 tablet (600 mg total) by mouth every 6 (six) hours as  needed. Patient not taking: Reported on 04/06/2017 04/05/17   Couture, Cortni S, PA-C  Norgestimate-Ethinyl Estradiol Triphasic (TRI-SPRINTEC) 0.18/0.215/0.25 MG-35 MCG tablet Take 1 tablet by mouth daily. 11/03/10 05/01/11  Woodroe Mode, MD    Family History Family History  Problem Relation Age of Onset  . Hypertension Mother   . Diabetes Mother   . Cancer Father   . Hypertension Father   . Diabetes Brother   . Hypertension Brother     Social History Social History   Tobacco Use  . Smoking status: Current Every Day Smoker    Packs/day: 0.50    Years: 10.00    Pack years: 5.00    Types: Cigarettes  . Smokeless tobacco: Never Used  Substance Use Topics   . Alcohol use: Yes    Comment: occasionally  . Drug use: No     Allergies   Patient has no known allergies.   Review of Systems Review of Systems  Constitutional: Positive for activity change.  HENT: Positive for facial swelling.   Respiratory: Negative for stridor.   Gastrointestinal: Negative for nausea and vomiting.  Allergic/Immunologic: Negative for immunocompromised state.     Physical Exam Updated Vital Signs BP 106/79 (BP Location: Left Arm)   Pulse 76   Temp 99.1 F (37.3 C) (Oral)   Resp 16   Ht 5\' 2"  (1.575 m)   Wt 73.9 kg (163 lb)   [name] 04/05/2017 (Exact Date)   SpO2 99%   BMI 29.81 kg/m   Physical Exam  Constitutional: She is oriented to person, place, and time. She appears well-developed.  HENT:  Head: Normocephalic and atraumatic.  Pt has no trismus, stridor or crepitus over the neck. Left-sided submandibular swelling, with induration. Patient has tenderness to palpation over tooth #20 and 21. There is no significant gingival swelling or fluctuance appreciated.  Base of the tongue has no protrusion and is normal.  Eyes: EOM are normal.  Neck: Normal range of motion. Neck supple.  Cardiovascular: Normal rate.  Pulmonary/Chest: Effort normal.  Abdominal: Bowel sounds are normal.  Neurological: She is alert and oriented to person, place, and time.  Skin: Skin is warm and dry.  Nursing note and vitals reviewed.    ED Treatments / Results  Labs (all labs ordered are listed, but only abnormal results are displayed) Labs Reviewed - No data to display  EKG  EKG Interpretation None       Radiology No results found.  Procedures Procedures (including critical care time)  Medications Ordered in ED Medications  oxyCODONE-acetaminophen (PERCOCET/ROXICET) 5-325 MG per tablet 1 tablet (1 tablet Oral Given 04/06/17 1009)     Initial Impression / Assessment and Plan / ED Course  I have reviewed the triage vital signs and the nursing  notes.  Pertinent labs & imaging results that were available during my care of the patient were reviewed by me and considered in my medical decision making (see chart for details).    37 year old female comes in with chief complaint of left-sided toothache and facial swelling.  Based on my exam, differential diagnosis includes sialoadenitis, worsening dental infection.  Given the specific complaint of toothache, and tenderness to palpation over the left lower bicuspid tooth, we will recommend that patient follow-up with oral surgery.  Patient is on Augmentin, which if this is indeed sialoadenitis it will help.  I spoke with Dr. Hoyt Koch, oral surgery's clinic.  They will see the patient in 1 PM today.  Patient informed to return to the  ER if at any point she starts having difficulty in breathing, or she has severe headaches, altered mental status or seizures.   Final Clinical Impressions(s) / ED Diagnoses   Final diagnoses:  Dental abscess  Facial swelling    ED Discharge Orders    None       Varney Biles, MD 04/06/17 1049

## 2017-04-23 ENCOUNTER — Other Ambulatory Visit: Payer: Self-pay

## 2017-04-23 DIAGNOSIS — Z1231 Encounter for screening mammogram for malignant neoplasm of breast: Secondary | ICD-10-CM

## 2017-04-28 ENCOUNTER — Ambulatory Visit: Payer: BLUE CROSS/BLUE SHIELD | Admitting: Family Medicine

## 2017-04-28 ENCOUNTER — Encounter: Payer: Self-pay | Admitting: Family Medicine

## 2017-04-28 ENCOUNTER — Other Ambulatory Visit (HOSPITAL_COMMUNITY)
Admission: RE | Admit: 2017-04-28 | Discharge: 2017-04-28 | Disposition: A | Discharge status: Home / Self Care | Payer: BLUE CROSS/BLUE SHIELD | Source: Ambulatory Visit | Attending: Family Medicine | Admitting: Family Medicine

## 2017-04-28 VITALS — BP 102/80 | HR 73 | Temp 98.1°F | Ht 62.0 in | Wt 170.2 lb

## 2017-04-28 DIAGNOSIS — Z0000 Encounter for general adult medical examination without abnormal findings: Secondary | ICD-10-CM | POA: Diagnosis not present

## 2017-04-28 DIAGNOSIS — Z01419 Encounter for gynecological examination (general) (routine) without abnormal findings: Secondary | ICD-10-CM | POA: Insufficient documentation

## 2017-04-28 NOTE — Assessment & Plan Note (Addendum)
Pap smear with HPV co-testing performed today.  Lipid panel also obtained.  Patient counseled on smoking cessation and lifestyle modifications for a healthier weight.  Also encouraged to see a dentist regularly.

## 2017-04-28 NOTE — Progress Notes (Signed)
   Subjective:    [name] - 37 y.o. female MRN [id number]  Date of birth: 1981-02-07  HPI  [name] is here to establish care.  She has no current complaints.  She would like screening blood work done as well as a pap smear.  She is unsure of the date of her most recent pap.    She takes no medications, and her past medical history is pertinent for removal her right ovary, although she is unsure why this was done.  She has been told that she is likely infertile due to a "split tube, endometriosis, and scarring."  Her periods have been regular and normal.  She denies any clinical symptoms of endometriosis, but says evidence supporting this diagnosis was visualized during surgery.  Health Maintenance:   Health Maintenance Due  Topic Date Due  . TETANUS/TDAP  03/01/1998  . PAP SMEAR  09/20/2016    -  reports that she has been smoking cigarettes.  She has a 5.00 pack-year smoking history. she has never used smokeless tobacco. - Review of Systems: Per HPI. - Past Medical History: Patient Active Problem List   Diagnosis Date Noted  . Breast mass in female 01/22/2013  . Cervical cancer screening 01/11/2013  . Health care maintenance 07/06/2012  . BV (bacterial vaginosis) 01/18/2012  . General counseling for prescription of oral contraceptives 11/06/2011  . Endometriosis 05/01/2011  . Breast tenderness in female 05/01/2011  . Ovarian cyst   . BOILS, RECURRENT 12/27/2009  . SEPTATE UTERUS 07/24/2009  . TOBACCO ABUSE 09/26/2008  . Female infertility 09/26/2008  . DENTAL PAIN 04/30/2008  . VIRAL LABYRINTHITIS 06/20/2007  . IRREGULAR MENSTRUATION 09/21/2006   - Medications: reviewed and updated   Objective:   Physical Exam BP 102/80 (BP Location: Left Arm, Patient Position: Sitting, Cuff Size: Normal)   Pulse 73   Temp 98.1 F (36.7 C) (Oral)   Ht 5\' 2"  (1.575 m)   Wt 170 lb 3.2 oz (77.2 kg)   [name] 04/04/2017   SpO2 100%   BMI 31.13 kg/m  Gen: NAD, alert, cooperative  with exam, well-appearing HEENT: NCAT, PERRL, clear conjunctiva, oropharynx clear, supple neck CV: RRR, good S1/S2, no murmur, no edema, capillary refill brisk  Resp: CTABL, no wheezes, non-labored Abd: SNTND, BS present, no guarding or organomegaly GU: normal appearing vulva, vagina and cervix; discharge appears normal Skin: no rashes, normal turgor  Neuro: no gross deficits.  Psych: good insight, alert and oriented        Assessment & Plan:   Health care maintenance Pap smear with HPV co-testing performed today.  Lipid panel also obtained.  Patient counseled on smoking cessation and lifestyle modifications for a healthier weight.  Also encouraged to see a dentist regularly.    Maia Breslow, M.D. 04/28/2017, 8:11 PM PGY-1, [place]

## 2017-04-28 NOTE — Patient Instructions (Addendum)
It was nice meeting you today [name]!  Today, we did a pap smear with HPV cotesting.  We will be in touch with you about these results once they come back.  We also got measured your cholesterol with a lipid profile, and we will let you know about that result once it returns.    This summer, hopefully you can get some exercise about three times per week.  If you are ever ready to quit smoking, I will be happy to give you resources to help.  I would like to see you back in one year or earlier if you need.  If you have any questions or concerns, please feel free to call the clinic.   Be well,  Dr. Shan Levans  Human Papillomavirus Human papillomavirus (HPV) is the most common sexually transmitted infection (STI). It easily spreads from person to person (is highly contagious). HPV infections cause genital warts. Certain types of HPV may cause cancers, including cancer of the lower part of the uterus (cervix), vagina, outer female genital area (vulva), penis, anus, and rectum. HPV may also cause cancers of the oral cavity, such as the throat, tongue, and tonsils. There are many types of HPV. It usually does not cause symptoms. However, sometimes there are wart-like lesions in the throat or warts in the genital area that you can see or feel. It is possible to be infected for long periods and pass HPV to others without knowing it. What are the causes? HPV is caused by a virus that spreads from person to person through sexual contact. This includes oral, vaginal, or anal sex. What increases the risk? The following factors may make you more likely to develop this condition:  Having unprotected oral, vaginal, or anal sex.  Having several sex partners.  Having a sex partner who has other sex partners.  Having or having had another STI.  Having a weak disease-fighting (immune) system.  Having damaged skin in the genital area.  What are the signs or symptoms? Most people who have HPV do not have  any symptoms. If symptoms are present, they may include:  Wartlike lesions in the throat (from having oral sex).  Warts on the infected skin or mucous membranes.  Genital warts that may itch, burn, bleed, or be painful during sexual intercourse.  How is this diagnosed? If wartlike lesions are present in the throat or if genital warts are present, your health care provider can usually diagnose HPV with a physical exam. Genital warts are easily seen. In females, tests may be used to diagnose HPV, including:  A Pap test. A Pap test takes a sample of cells from your cervix to check for cancer and HPV infection.  An HPV test. This is similar to a Pap test and involves taking a sample of cells from your cervix.  Using a scope to view the cervix (colposcopy). This may be done if a pelvic exam or Pap test is abnormal. A sample of tissue may be removed for testing (biopsy) during the colposcopy.  Currently, there is no test to detect HPV in males. How is this treated? There is no treatment for the virus itself. However, there are treatments for the health problems and symptoms HPV can cause. Your health care provider will monitor you closely after you are treated as HPV can come back and may need treatment again. Treatment for HPV may include:  Medicines, which may be injected or applied to genital warts in a cream, lotion, liquid or  gel form.  Use of a probe to apply extreme cold (cryotherapy) to the genital warts.  Application of an intense beam of light (laser treatment) on the genital warts.  Use of a probe to apply extreme heat (electrocautery) on the genital warts.  Surgery to remove the genital warts.  Follow these instructions at home: Medicines  Take over-the-counter and prescription medicines only as told by your health care provider. This include creams for itching or irritation.  Do not treat genital warts with medicines used for treating hand warts. General instructions  Do  not touch or scratch the warts.  Do not have sex while you are being treated.  Do not douche or use tampons during treatment (women).  Tell your sex partner about your infection. He or she may also need to be treated.  If you become pregnant, tell your health care provider that you have HPV. Your health care provider will monitor you closely during pregnancy to make sure your baby is safe.  Keep all follow-up visits as told by your health care provider. This is important. How is this prevented?  Talk with your health care provider about getting the HPV vaccines. These vaccines prevent some HPV infections and cancers. The vaccines are recommended for males and females between the ages of 13 and 71. They will not work if you already have HPV, and they are not recommended for pregnant women.  After treatment, use condoms during sex to prevent future infections.  Have only one sex partner.  Have a sex partner who does not have other sex partners.  Get regular Pap tests as directed by your health care provider. Contact a health care provider if:  The treated skin becomes red, swollen, or painful.  You have a fever.  You feel generally ill.  You feel lumps or pimples sticking out in and around your genital area.  You develop bleeding of the vagina or the treatment area.  You have painful sexual intercourse. Summary  Human papillomavirus (HPV) is the most common sexually transmitted infection (STI) and is highly contagious.  Most people carrying HPV do not have any symptoms.  HPV can be prevented with vaccination. The vaccine is recommended for males and females between the ages of 67 and 57.  There is no treatment for the virus itself. However, there are treatments for the health problems and symptoms HPV can cause. This information is not intended to replace advice given to you by your health care provider. Make sure you discuss any questions you have with your health care  provider. Document Released: 04/18/2003 Document Revised: 01/05/2016 Document Reviewed: 01/05/2016 Elsevier Interactive Patient Education  [employer].

## 2017-04-29 LAB — LIPID PANEL
CHOLESTEROL TOTAL: 135 mg/dL (ref 100–199)
Chol/HDL Ratio: 2.3 ratio (ref 0.0–4.4)
HDL: 58 mg/dL (ref >39)
LDL CALC: 68 mg/dL (ref 0–99)
TRIGLYCERIDES: 46 mg/dL (ref 0–149)
VLDL Cholesterol Cal: 9 mg/dL (ref 5–40)

## 2017-04-30 LAB — CYTOLOGY - PAP
Adequacy: ABSENT
DIAGNOSIS: NEGATIVE
HPV (WINDOPATH): NOT DETECTED

## 2017-05-04 ENCOUNTER — Telehealth: Payer: Self-pay | Admitting: Family Medicine

## 2017-05-04 NOTE — Telephone Encounter (Signed)
Pt would like to know her results from her appt last week. Pt said it's okay to leave a message since she is at work. Please advise

## 2017-05-04 NOTE — Telephone Encounter (Signed)
Will call patient with results this afternoon.

## 2017-05-04 NOTE — Telephone Encounter (Signed)
Called patient and discussed results of lipid panel and pap smear with her.

## 2017-06-08 ENCOUNTER — Encounter (HOSPITAL_COMMUNITY): Payer: Self-pay | Admitting: Emergency Medicine

## 2017-06-08 ENCOUNTER — Ambulatory Visit (HOSPITAL_COMMUNITY)
Admission: EM | Admit: 2017-06-08 | Discharge: 2017-06-08 | Disposition: A | Discharge status: Home / Self Care | Payer: BLUE CROSS/BLUE SHIELD | Attending: Family Medicine | Admitting: Family Medicine

## 2017-06-08 DIAGNOSIS — N898 Other specified noninflammatory disorders of vagina: Secondary | ICD-10-CM | POA: Insufficient documentation

## 2017-06-08 DIAGNOSIS — Z202 Contact with and (suspected) exposure to infections with a predominantly sexual mode of transmission: Secondary | ICD-10-CM | POA: Insufficient documentation

## 2017-06-08 LAB — POCT URINALYSIS DIP (DEVICE)
Bilirubin Urine: NEGATIVE
Glucose, UA: NEGATIVE mg/dL
HGB URINE DIPSTICK: NEGATIVE
Ketones, ur: 15 mg/dL — AB
LEUKOCYTES UA: NEGATIVE
Nitrite: NEGATIVE
PH: 6 (ref 5.0–8.0)
PROTEIN: NEGATIVE mg/dL
Specific Gravity, Urine: >=1.03 (ref 1.005–1.030)
UROBILINOGEN UA: 0.2 mg/dL (ref 0.0–1.0)

## 2017-06-08 LAB — POCT PREGNANCY, URINE: Preg Test, Ur: NEGATIVE

## 2017-06-08 MED ORDER — METRONIDAZOLE 0.75 % VA GEL: 1.0000 | Freq: Two times a day (BID) | VAGINAL | 0 refills | Status: AC

## 2017-06-08 MED ORDER — FLUCONAZOLE 150 MG PO TABS: 150.0000 mg | ORAL_TABLET | Freq: Every day | ORAL | 0 refills | Status: DC

## 2017-06-08 NOTE — ED Provider Notes (Signed)
[place]    CSN: [id number] Arrival date & time: 06/08/17  1706     History   Chief Complaint Chief Complaint  Patient presents with  . Exposure to STD    HPI [name] is a 37 y.o. female.   37 year old female comes in for few day history of vaginal discharge. Mild vaginal itching.  Denies vaginal pain.  Has some discomfort  when urinating, but denies hematuria, frequency, urgency.  Denies fever, chills, night sweats.  Denies abdominal pain, nausea, vomiting.  She is sexually active with one female partner, states condom broke during intercourse.  [name] 05/30/2017.  No birth control use.  No changes in hygiene products.     Past Medical History:  Diagnosis Date  . Abscess   . BOILS, RECURRENT   . Breast tenderness in female   . BV (bacterial vaginosis)   . DENTAL PAIN   . Endometriosis   . FEMALE INFERTILITY   . Gardnerella vaginalis infection   . IRREGULAR MENSTRUATION   . Ovarian cyst   . SEPTATE UTERUS   . Viral labyrinthitis     Patient Active Problem List   Diagnosis Date Noted  . Breast mass in female 01/22/2013  . Cervical cancer screening 01/11/2013  . Health care maintenance 07/06/2012  . BV (bacterial vaginosis) 01/18/2012  . General counseling for prescription of oral contraceptives 11/06/2011  . Endometriosis 05/01/2011  . Breast tenderness in female 05/01/2011  . Ovarian cyst   . BOILS, RECURRENT 12/27/2009  . SEPTATE UTERUS 07/24/2009  . TOBACCO ABUSE 09/26/2008  . Female infertility 09/26/2008  . DENTAL PAIN 04/30/2008  . VIRAL LABYRINTHITIS 06/20/2007  . IRREGULAR MENSTRUATION 09/21/2006    Past Surgical History:  Procedure Laterality Date  . biopsy of right breast    . BREAST BIOPSY  1999   right  . OVARIAN CYST REMOVAL  Mar 25 2010  . OVARIAN CYST REMOVAL  03/25/2010   right    OB History    Gravida  0   Para      Term      Preterm      AB      Living  0     SAB      TAB      Ectopic      Multiple        Live Births               Home Medications    Prior to Admission medications   Medication Sig Start Date End Date Taking? Authorizing Provider  amoxicillin-clavulanate (AUGMENTIN) 875-125 MG tablet Take 1 tablet by mouth every 12 (twelve) hours. 04/05/17   Vanessa Kick, MD  fluconazole (DIFLUCAN) 150 MG tablet Take 1 tablet (150 mg total) by mouth daily. Take second dose 72 hours later if symptoms still persists. 06/08/17   Tasia Catchings, Undra Harriman V, PA-C  HYDROcodone-acetaminophen (NORCO/VICODIN) 5-325 MG tablet Take 1 tablet by mouth every 6 (six) hours as needed for moderate pain or severe pain. 04/05/17   Vanessa Kick, MD  HYDROcodone-homatropine [facility]) 5-1.5 MG/5ML syrup Take 5 mLs by mouth every 6 (six) hours as needed for cough. Patient not taking: Reported on 04/06/2017 03/17/17   Vanessa Kick, MD  ibuprofen (ADVIL,MOTRIN) 600 MG tablet Take 1 tablet (600 mg total) by mouth every 6 (six) hours as needed. Patient not taking: Reported on 04/06/2017 04/05/17   Couture, Cortni S, PA-C  metroNIDAZOLE (FLAGYL) 500 MG tablet Take 1 tablet (500 mg total) by  mouth 2 (two) times daily. For positive BV test 02/24/17   Wynona Luna, MD  metroNIDAZOLE (METROGEL VAGINAL) 0.75 % vaginal gel Place 1 Applicatorful vaginally 2 (two) times daily for 5 days. 06/08/17 06/13/17  Ok Edwards, PA-C  Norgestimate-Ethinyl Estradiol Triphasic (TRI-SPRINTEC) 0.18/0.215/0.25 MG-35 MCG tablet Take 1 tablet by mouth daily. 11/03/10 05/01/11  Woodroe Mode, MD    Family History Family History  Problem Relation Age of Onset  . Hypertension Mother   . Diabetes Mother   . Cancer Father   . Hypertension Father   . Diabetes Brother   . Hypertension Brother     Social History Social History   Tobacco Use  . Smoking status: Current Every Day Smoker    Packs/day: 0.50    Years: 10.00    Pack years: 5.00    Types: Cigarettes  . Smokeless tobacco: Never Used  Substance Use Topics  . Alcohol use: Yes    Comment:  occasionally  . Drug use: No     Allergies   Patient has no known allergies.   Review of Systems Review of Systems  Reason unable to perform ROS: See HPI as above.     Physical Exam Triage Vital Signs ED Triage Vitals [06/08/17 1744]  Enc Vitals Group     BP 113/80     Pulse Rate 83     Resp 16     Temp 97.8 F (36.6 C)     Temp Source Oral     SpO2 100 %     Weight      Height      Head Circumference      Peak Flow      Pain Score      Pain Loc      Pain Edu?      Excl. in [place]?    No data found.  Updated Vital Signs BP 113/80 (BP Location: Left Arm)   Pulse 83   Temp 97.8 F (36.6 C) (Oral)   Resp 16   SpO2 100%   Physical Exam  Constitutional: She is oriented to person, place, and time. She appears well-developed and well-nourished. No distress.  HENT:  Head: Normocephalic and atraumatic.  Eyes: Pupils are equal, round, and reactive to light. Conjunctivae are normal.  Cardiovascular: Normal rate, regular rhythm and normal heart sounds. Exam reveals no gallop and no friction rub.  No murmur heard. Pulmonary/Chest: Effort normal and breath sounds normal. She has no wheezes. She has no rales.  Abdominal: Soft. Bowel sounds are normal. She exhibits no mass. There is no rebound, no guarding and no CVA tenderness.  Patient stated "yes that hurts" throughout whole abdomen, no obvious changes in expression. No guarding, no rebound.   Genitourinary: Uterus normal. There is no rash, tenderness or lesion on the right labia. There is no rash, tenderness or lesion on the left labia. Cervix exhibits no motion tenderness, no discharge and no friability. Right adnexum displays no mass, no tenderness and no fullness. Left adnexum displays no mass, no tenderness and no fullness. No tenderness or bleeding in the vagina. Vaginal discharge found.  Neurological: She is alert and oriented to person, place, and time.  Skin: Skin is warm and dry.  Psychiatric: She has a normal mood  and affect. Her behavior is normal. Judgment normal.    UC Treatments / Results  Labs (all labs ordered are listed, but only abnormal results are displayed) Labs Reviewed  POCT URINALYSIS DIP (DEVICE) -  Abnormal; Notable for the following components:      Result Value   Ketones, ur 15 (*)    All other components within normal limits  HIV ANTIBODY (ROUTINE TESTING)  RPR  HSV 1 ANTIBODY, IGG  HSV 2 ANTIBODY, IGG  POCT PREGNANCY, URINE  CERVICOVAGINAL ANCILLARY ONLY    EKG None  Radiology No results found.  Procedures Procedures (including critical care time)  Medications Ordered in UC Medications - No data to display  Initial Impression / Assessment and Plan / UC Course  I have reviewed the triage vital signs and the nursing notes.  Pertinent labs & imaging results that were available during my care of the patient were reviewed by me and considered in my medical decision making (see chart for details).    Urine negative for pregnancy/UTI. Will treat empirically for BV and yeast. Start metrogel and diflucan as directed. Patient would like testing of HIV, syphilis, HSV. Discussed with patient HSV testing will not show length of infection/time of exposure. Patient expresses understanding and would like to proceed. Blood work and cytology sent, patient will be contacted with any positive results that require additional treatment. Patient to refrain from sexual activity for the next 7 days. Return precautions given.   Final Clinical Impressions(s) / UC Diagnoses   Final diagnoses:  Vaginal discharge    ED Prescriptions    Medication Sig Dispense Auth. Provider   fluconazole (DIFLUCAN) 150 MG tablet Take 1 tablet (150 mg total) by mouth daily. Take second dose 72 hours later if symptoms still persists. 2 tablet Carsynn Bethune V, PA-C   metroNIDAZOLE (METROGEL VAGINAL) 0.75 % vaginal gel Place 1 Applicatorful vaginally 2 (two) times daily for 5 days. 100 g Tobin Chad, [place] 06/08/17 [id number]

## 2017-06-08 NOTE — Discharge Instructions (Signed)
Urine negative for pregnancy, infection. You were treated empirically for yeast and BV. Start metrogel and flagyl as directed. Blood work and cytology sent, you will be contacted with any positive results that requires further treatment. Refrain from sexual activity and alcohol use for the next 7 days. Monitor for any worsening of symptoms, fever, abdominal pain, nausea, vomiting, to follow up for reevaluation.

## 2017-06-08 NOTE — ED Triage Notes (Signed)
Pt sts needs STD screening and has vaginal discharge

## 2017-06-09 LAB — CERVICOVAGINAL ANCILLARY ONLY
Bacterial vaginitis: POSITIVE — AB
Candida vaginitis: NEGATIVE
Chlamydia: NEGATIVE
Neisseria Gonorrhea: NEGATIVE
TRICH (WINDOWPATH): NEGATIVE

## 2017-06-09 LAB — RPR: RPR: NONREACTIVE

## 2017-06-09 LAB — HIV ANTIBODY (ROUTINE TESTING W REFLEX): HIV SCREEN 4TH GENERATION: NONREACTIVE

## 2017-06-10 LAB — HSV 1 ANTIBODY, IGG: HSV 1 GLYCOPROTEIN G AB, IGG: 0.98 {index} — AB (ref 0.00–0.90)

## 2017-06-10 LAB — HSV 2 ANTIBODY, IGG: HSV 2 Glycoprotein G Ab, IgG: 7.93 index — ABNORMAL HIGH (ref 0.00–0.90)

## 2017-06-10 NOTE — Progress Notes (Signed)
Bacterial Vaginosis test is positive.  Prescription for metronidazole was given at the urgent care visit.  Also patient is positive or Herpes. Attempted to reach patient, no answer at this time. Will try again.

## 2017-06-14 ENCOUNTER — Ambulatory Visit (HOSPITAL_COMMUNITY)
Admission: EM | Admit: 2017-06-14 | Discharge: 2017-06-14 | Disposition: A | Discharge status: Home / Self Care | Payer: BLUE CROSS/BLUE SHIELD | Attending: Family Medicine | Admitting: Family Medicine

## 2017-06-14 ENCOUNTER — Encounter (HOSPITAL_COMMUNITY): Payer: Self-pay | Admitting: Emergency Medicine

## 2017-06-14 ENCOUNTER — Other Ambulatory Visit: Payer: Self-pay

## 2017-06-14 ENCOUNTER — Telehealth (HOSPITAL_COMMUNITY): Payer: Self-pay

## 2017-06-14 DIAGNOSIS — N898 Other specified noninflammatory disorders of vagina: Secondary | ICD-10-CM

## 2017-06-14 NOTE — ED Provider Notes (Signed)
[place]    CSN: [id number] Arrival date & time: 06/14/17  1204     History   Chief Complaint Chief Complaint  Patient presents with  . Follow-up    HPI [name] is a 37 y.o. female.   37 year old female comes in for follow-up after STD testing.  Serum antibody positive for HSV.  Patient without previous testing for HSV.  Has never had open sores before.  She has some vaginal irritation, with positive BV diagnosis, has not started medication for BV.  No obvious pain.  Denies fever, chills, night sweats. Denies abdominal pain, nausea, vomiting.      Past Medical History:  Diagnosis Date  . Abscess   . BOILS, RECURRENT   . Breast tenderness in female   . BV (bacterial vaginosis)   . DENTAL PAIN   . Endometriosis   . FEMALE INFERTILITY   . Gardnerella vaginalis infection   . IRREGULAR MENSTRUATION   . Ovarian cyst   . SEPTATE UTERUS   . Viral labyrinthitis     Patient Active Problem List   Diagnosis Date Noted  . Breast mass in female 01/22/2013  . Cervical cancer screening 01/11/2013  . Health care maintenance 07/06/2012  . BV (bacterial vaginosis) 01/18/2012  . General counseling for prescription of oral contraceptives 11/06/2011  . Endometriosis 05/01/2011  . Breast tenderness in female 05/01/2011  . Ovarian cyst   . BOILS, RECURRENT 12/27/2009  . SEPTATE UTERUS 07/24/2009  . TOBACCO ABUSE 09/26/2008  . Female infertility 09/26/2008  . DENTAL PAIN 04/30/2008  . VIRAL LABYRINTHITIS 06/20/2007  . IRREGULAR MENSTRUATION 09/21/2006    Past Surgical History:  Procedure Laterality Date  . biopsy of right breast    . BREAST BIOPSY  1999   right  . OVARIAN CYST REMOVAL  Mar 25 2010  . OVARIAN CYST REMOVAL  03/25/2010   right    OB History    Gravida  0   Para      Term      Preterm      AB      Living  0     SAB      TAB      Ectopic      Multiple      Live Births               Home Medications    Prior to  Admission medications   Medication Sig Start Date End Date Taking? Authorizing Provider  HYDROcodone-homatropine (HYCODAN) 5-1.5 MG/5ML syrup Take 5 mLs by mouth every 6 (six) hours as needed for cough. Patient not taking: Reported on 04/06/2017 03/17/17   Vanessa Kick, MD  ibuprofen (ADVIL,MOTRIN) 600 MG tablet Take 1 tablet (600 mg total) by mouth every 6 (six) hours as needed. Patient not taking: Reported on 04/06/2017 04/05/17   Couture, Cortni S, PA-C  Norgestimate-Ethinyl Estradiol Triphasic (TRI-SPRINTEC) 0.18/0.215/0.25 MG-35 MCG tablet Take 1 tablet by mouth daily. 11/03/10 05/01/11  Woodroe Mode, MD    Family History Family History  Problem Relation Age of Onset  . Hypertension Mother   . Diabetes Mother   . Cancer Father   . Hypertension Father   . Diabetes Brother   . Hypertension Brother     Social History Social History   Tobacco Use  . Smoking status: Current Every Day Smoker    Packs/day: 0.50    Years: 10.00    Pack years: 5.00    Types: Cigarettes  .  Smokeless tobacco: Never Used  Substance Use Topics  . Alcohol use: Yes    Comment: occasionally  . Drug use: No     Allergies   Patient has no known allergies.   Review of Systems Review of Systems  Reason unable to perform ROS: See HPI as above.     Physical Exam Triage Vital Signs ED Triage Vitals [06/14/17 1326]  Enc Vitals Group     BP 132/81     Pulse Rate 89     Resp      Temp 98.2 F (36.8 C)     Temp Source Oral     SpO2 100 %     Weight      Height      Head Circumference      Peak Flow      Pain Score 0     Pain Loc      Pain Edu?      Excl. in [place]?    No data found.  Updated Vital Signs BP 132/81 (BP Location: Left Arm)   Pulse 89   Temp 98.2 F (36.8 C) (Oral)   SpO2 100%   Physical Exam  Constitutional: She is oriented to person, place, and time. She appears well-developed and well-nourished. No distress.  HENT:  Head: Normocephalic and atraumatic.  Eyes: Pupils  are equal, round, and reactive to light. Conjunctivae are normal.  Genitourinary: There is no rash, tenderness or lesion on the right labia. There is no rash, tenderness or lesion on the left labia. Vaginal discharge found.  Neurological: She is alert and oriented to person, place, and time.  Skin: She is not diaphoretic.  Psychiatric:  Patient tearful on exam.      UC Treatments / Results  Labs (all labs ordered are listed, but only abnormal results are displayed) Labs Reviewed - No data to display  EKG None  Radiology No results found.  Procedures Procedures (including critical care time)  Medications Ordered in UC Medications - No data to display  Initial Impression / Assessment and Plan / UC Course  I have reviewed the triage vital signs and the nursing notes.  Pertinent labs & imaging results that were available during my care of the patient were reviewed by me and considered in my medical decision making (see chart for details).    No visible genital ulcers today.  Will have patient continue medication for BV.  Information on HSV attached.  Patient to follow-up with GYN for further monitoring needed.  Return precautions given.  Final Clinical Impressions(s) / UC Diagnoses   Final diagnoses:  Vaginal irritation    ED Prescriptions    None        Ok Edwards, PA-C 06/14/17 1405

## 2017-06-14 NOTE — Discharge Instructions (Signed)
No open sores seen on exam today. Continue treatment for BV. Follow up with GYN for further monitoring needed.

## 2017-06-14 NOTE — ED Triage Notes (Signed)
Pt here today concerned about her newly diagnosed HSV.  Pt was called today with results and she is here today, tearful and with questions.  She wants to know if she currently has any visible sores.

## 2017-06-14 NOTE — Progress Notes (Signed)
Pt called and made aware of positive test results. Educated on Herpes. Pt is tearful on the phone. Given emotional support. Answered all questions. Verbalized understanding.

## 2017-06-14 NOTE — Telephone Encounter (Signed)
Herpes screening is positive for HSV , pt contacted and made aware. Educated on Herpes and safe sex practices. Pt denies any concerns and verbalized understanding.

## 2017-07-09 ENCOUNTER — Ambulatory Visit: Payer: BLUE CROSS/BLUE SHIELD | Admitting: Family Medicine

## 2017-07-09 ENCOUNTER — Other Ambulatory Visit (HOSPITAL_COMMUNITY)
Admission: RE | Admit: 2017-07-09 | Discharge: 2017-07-09 | Disposition: A | Discharge status: Home / Self Care | Payer: BLUE CROSS/BLUE SHIELD | Source: Ambulatory Visit | Attending: Family Medicine | Admitting: Family Medicine

## 2017-07-09 ENCOUNTER — Encounter: Payer: Self-pay | Admitting: Family Medicine

## 2017-07-09 ENCOUNTER — Other Ambulatory Visit: Payer: Self-pay

## 2017-07-09 VITALS — BP 96/60 | HR 80 | Temp 97.9°F | Ht 62.0 in | Wt 170.8 lb

## 2017-07-09 DIAGNOSIS — Z0000 Encounter for general adult medical examination without abnormal findings: Secondary | ICD-10-CM | POA: Diagnosis not present

## 2017-07-09 DIAGNOSIS — B009 Herpesviral infection, unspecified: Secondary | ICD-10-CM | POA: Diagnosis not present

## 2017-07-09 DIAGNOSIS — Z113 Encounter for screening for infections with a predominantly sexual mode of transmission: Secondary | ICD-10-CM | POA: Insufficient documentation

## 2017-07-09 DIAGNOSIS — Z23 Encounter for immunization: Secondary | ICD-10-CM | POA: Diagnosis not present

## 2017-07-09 LAB — POCT WET PREP (WET MOUNT)
Clue Cells Wet Prep Whiff POC: NEGATIVE
TRICHOMONAS WET PREP HPF POC: ABSENT

## 2017-07-09 NOTE — Progress Notes (Signed)
Subjective:    [name] - 37 y.o. female MRN [id number]  Date of birth: 1980-09-25  HPI  [name] is here for STI screening.  She went to the ED last weekend due to vaginal irritation and asked for STD screening at that time.  A visual inspection of her vulva and vagina was performed and was found to be normal.  Testing for HSV-1 and HSV-2 IgG antibodies was included in the STI testing, and her HSV-2 IgG level was found to be positive for past or current infection.  She was surprised by this news since she has never had lesions.  She would like to be retested for this today.  She also says that a condom broke during intercourse in the last few days, and it came out of her vagina a few days after intercourse.  She did not realize that the condom had been in her vagina and wants to make sure all of it is out.    She has had regular periods and does not think she could be pregnant.  She has had an increase in the quantity of vaginal discharge but notes no change in color.  She thinks there may be a change in the smell, however.  She would like full STI screening today.    Health Maintenance:  - agreeable to Tdap today Health Maintenance Due  Topic Date Due  . TETANUS/TDAP  03/01/1998    -  reports that she has been smoking cigarettes.  She has a 5.00 pack-year smoking history. She has never used smokeless tobacco. - Review of Systems: Per HPI. - Past Medical History: Patient Active Problem List   Diagnosis Date Noted  . Screen for STD (sexually transmitted disease) 07/09/2017  . Breast mass in female 01/22/2013  . Cervical cancer screening 01/11/2013  . Health care maintenance 07/06/2012  . BV (bacterial vaginosis) 01/18/2012  . General counseling for prescription of oral contraceptives 11/06/2011  . Endometriosis 05/01/2011  . Breast tenderness in female 05/01/2011  . Ovarian cyst   . BOILS, RECURRENT 12/27/2009  . SEPTATE UTERUS 07/24/2009  . TOBACCO ABUSE 09/26/2008    . Female infertility 09/26/2008  . DENTAL PAIN 04/30/2008  . VIRAL LABYRINTHITIS 06/20/2007  . IRREGULAR MENSTRUATION 09/21/2006   - Medications: reviewed and updated   Objective:   Physical Exam BP 96/60   Pulse 80   Temp 97.9 F (36.6 C) (Oral)   Ht 5\' 2"  (1.575 m)   Wt 170 lb 12.8 oz (77.5 kg)   [name] 07/03/2017 (Exact Date)   SpO2 100%   BMI 31.24 kg/m  Gen: NAD, alert, cooperative with exam, well-appearing HEENT: NCAT, clear conjunctiva, supple neck CV: RRR, good S1/S2, no murmur, no edema, capillary refill brisk  Resp: CTABL, no wheezes, non-labored Abd: SNTND, BS present, no guarding or organomegaly GU: Vulva appears normal without lesions, vagina and cervix also appear normal.  No foreign bodies visualized in vagina.  Discharge is white and thick.        Assessment & Plan:   Screen for STD (sexually transmitted disease) Will repeat HSV-2 and HSV-1 antibody tests today per patient request as well as perform a wet prep, GC/Chlamydia, RPR, and HIV.  Counseled patient that the test shows that she was exposed to HSV-2, but she is only contagious if she has lesions, and we will treat her at that time.    Health care maintenance Patient give Tdap today.    Maia Breslow, M.D. 07/09/2017, 11:05 AM  PGY-1, [place]

## 2017-07-09 NOTE — Patient Instructions (Signed)
It was nice seeing you today [name]!  Today, we performed tests to make sure you do not have any infections.  We will send you a letter if they are normal, and I will call you if they are abnormal.  If you do have any lesions in the future, please let us know, and we will give you the appropriate medication.  If you have any questions or concerns, please feel free to call the clinic.   Be well,  Dr. Shan Levans

## 2017-07-09 NOTE — Assessment & Plan Note (Signed)
Patient give Tdap today.

## 2017-07-09 NOTE — Assessment & Plan Note (Addendum)
Will repeat HSV-2 and HSV-1 antibody tests today per patient request as well as perform a wet prep, GC/Chlamydia, RPR, and HIV.  Counseled patient that the test shows that she was exposed to HSV-2, but she is only contagious if she has lesions, and we will treat her at that time.

## 2017-07-12 ENCOUNTER — Telehealth: Payer: Self-pay | Admitting: Family Medicine

## 2017-07-12 LAB — HSV 2 ANTIBODY, IGG: HSV 2 IgG, Type Spec: 7.24 index — ABNORMAL HIGH (ref 0.00–0.90)

## 2017-07-12 LAB — HIV ANTIBODY (ROUTINE TESTING W REFLEX): HIV SCREEN 4TH GENERATION: NONREACTIVE

## 2017-07-12 LAB — CERVICOVAGINAL ANCILLARY ONLY
CHLAMYDIA, DNA PROBE: NEGATIVE
Neisseria Gonorrhea: NEGATIVE

## 2017-07-12 LAB — RPR: RPR Ser Ql: NONREACTIVE

## 2017-07-12 LAB — HSV 1 ANTIBODY, IGG: HSV 1 Glycoprotein G Ab, IgG: 1.12 index — ABNORMAL HIGH (ref 0.00–0.90)

## 2017-07-12 NOTE — Telephone Encounter (Signed)
Pt calling again for her results from her appt last Friday. Pt also says she thinks she has a uti or yeast infection and would like something called in without being seen again since she can't take anymore time off work.

## 2017-07-12 NOTE — Telephone Encounter (Signed)
Pt was calling wanting to know if her results are in from her last appointment on Friday.

## 2017-07-13 ENCOUNTER — Other Ambulatory Visit: Payer: Self-pay | Admitting: Family Medicine

## 2017-07-13 MED ORDER — CEPHALEXIN 500 MG PO CAPS
500.0000 mg | ORAL_CAPSULE | Freq: Two times a day (BID) | ORAL | 0 refills | Status: DC
Start: 2017-07-13 — End: 2017-08-01

## 2017-07-13 NOTE — Progress Notes (Signed)
Patient says she has lower back pain and urinary frequency, which are the symptoms she experienced when she has had UTIs in the past.  We did not obtain a UA on Friday, but patient is familiar with symptoms of UTIs for her, so will prescribe Keflex 500 mg BID x 7 days.  Told to come back in if she does not improve with this abx course.

## 2017-07-13 NOTE — Telephone Encounter (Signed)
Pt called again for her results from Fridays lab

## 2017-07-15 ENCOUNTER — Other Ambulatory Visit: Payer: Self-pay | Admitting: Family Medicine

## 2017-07-15 MED ORDER — FLUCONAZOLE 150 MG PO TABS: 150.0000 mg | ORAL_TABLET | Freq: Once | ORAL | 0 refills | Status: AC

## 2017-07-15 NOTE — Telephone Encounter (Signed)
I sent one tablet of diflucan to her pharmacy today.  This should treat her yeast infection, but if she continues to have symptoms on Monday, she should call again and I will send in one more tablet.  If her symptoms persist after that, she will need to come in to be seen.  Thank you!

## 2017-07-15 NOTE — Telephone Encounter (Signed)
Pt called back to tell Dr Shan Levans she does believe she is going to need the medication for her yeast infection. She said she has spoke with Dr Shan Levans and she told her to call her back to request this medication if needed.

## 2017-07-15 NOTE — Telephone Encounter (Signed)
Pt informed of below. Zimmerman Rumple, April D, CMA  

## 2017-08-01 ENCOUNTER — Encounter (HOSPITAL_COMMUNITY): Payer: Self-pay | Admitting: *Deleted

## 2017-08-01 ENCOUNTER — Ambulatory Visit (HOSPITAL_COMMUNITY)
Admission: EM | Admit: 2017-08-01 | Discharge: 2017-08-01 | Disposition: A | Discharge status: Home / Self Care | Payer: BLUE CROSS/BLUE SHIELD | Attending: Family Medicine | Admitting: Family Medicine

## 2017-08-01 ENCOUNTER — Other Ambulatory Visit: Payer: Self-pay

## 2017-08-01 DIAGNOSIS — K029 Dental caries, unspecified: Secondary | ICD-10-CM

## 2017-08-01 MED ORDER — AMOXICILLIN 875 MG PO TABS: 875.0000 mg | ORAL_TABLET | Freq: Two times a day (BID) | ORAL | 0 refills | Status: DC

## 2017-08-01 MED ORDER — TRAMADOL HCL 50 MG PO TABS: 50.0000 mg | ORAL_TABLET | Freq: Four times a day (QID) | ORAL | 0 refills | Status: DC | PRN

## 2017-08-01 NOTE — ED Provider Notes (Signed)
[place]   [id number] 08/01/17 Arrival Time: 75   SUBJECTIVE:  [name] is a 37 y.o. female who presents to the urgent care with complaint of right lower toothache x 4 days.  Now c/o right lower jaw swelling and pain into right lateral neck with lymphadenopathy.  Denies fevers.  Patient knows she needs dental care.  She's tried oragel for the pain.  Patient works at [employer].  Past Medical History:  Diagnosis Date  . Abscess   . BOILS, RECURRENT   . Breast tenderness in female   . BV (bacterial vaginosis)   . DENTAL PAIN   . Endometriosis   . FEMALE INFERTILITY   . Gardnerella vaginalis infection   . IRREGULAR MENSTRUATION   . Ovarian cyst   . SEPTATE UTERUS   . Viral labyrinthitis    Family History  Problem Relation Age of Onset  . Hypertension Mother   . Diabetes Mother   . Cancer Father   . Hypertension Father   . Diabetes Brother   . Hypertension Brother    Social History   Socioeconomic History  . Marital status: Single    Spouse name: Not on file  . Number of children: Not on file  . Years of education: Not on file  . Highest education level: Not on file  Occupational History  . Not on file  Social Needs  . Financial resource strain: Not on file  . Food insecurity:    Worry: Not on file    Inability: Not on file  . Transportation needs:    Medical: Not on file    Non-medical: Not on file  Tobacco Use  . Smoking status: Current Every Day Smoker    Packs/day: 0.50    Years: 10.00    Pack years: 5.00    Types: Cigarettes  . Smokeless tobacco: Never Used  Substance and Sexual Activity  . Alcohol use: Yes    Comment: occasionally  . Drug use: No  . Sexual activity: Yes    Birth control/protection: Condom  Lifestyle  . Physical activity:    Days per week: Not on file    Minutes per session: Not on file  . Stress: Not on file  Relationships  . Social connections:    Talks on phone: Not on file    Gets  together: Not on file    Attends religious service: Not on file    Active member of club or organization: Not on file    Attends meetings of clubs or organizations: Not on file    Relationship status: Not on file  . Intimate partner violence:    Fear of current or ex partner: Not on file    Emotionally abused: Not on file    Physically abused: Not on file    Forced sexual activity: Not on file  Other Topics Concern  . Not on file  Social History Narrative  . Not on file   No outpatient medications have been marked as taking for the 08/01/17 encounter [facility]).   No Known Allergies    ROS: As per HPI, remainder of ROS negative.   OBJECTIVE:   Vitals:   08/01/17 1205  BP: 121/81  Pulse: 87  Resp: 16  Temp: 98.6 F (37 C)  TempSrc: Oral  SpO2: 100%     General appearance: alert; no distress Eyes: PERRL; EOMI; conjunctiva normal HENT: normocephalic; atraumatic;  nasal mucosa normal; oral mucosa swollen around exposed roots of  teeth 27-29. Neck: supple; mild right anterior cervical adenopathy Back: no CVA tenderness Extremities: no cyanosis or edema; symmetrical with no gross deformities Skin: warm and dry Neurologic: normal gait; grossly normal Psychological: alert and cooperative; normal mood and affect      Labs:  Results for orders placed or performed in visit on 07/09/17  HSV 1 antibody, IgG  Result Value Ref Range   HSV 1 Glycoprotein G Ab, IgG 1.12 (H) 0.00 - 0.90 index  HSV 2 antibody, IgG  Result Value Ref Range   HSV 2 IgG, Type Spec 7.24 (H) 0.00 - 0.90 index  RPR  Result Value Ref Range   RPR Ser Ql Non Reactive Non Reactive  HIV antibody (with reflex)  Result Value Ref Range   HIV Screen 4th Generation wRfx Non Reactive Non Reactive  POCT Wet Prep ([place]et [place])  Result Value Ref Range   Source Wet Prep POC VAG    WBC, Wet Prep HPF POC NONE    Bacteria Wet Prep HPF POC Few Few   Clue Cells Wet Prep HPF POC None None   Clue Cells  Wet Prep Whiff POC Negative Whiff    Yeast Wet Prep HPF POC None None   KOH Wet Prep POC None None   Trichomonas Wet Prep HPF POC Absent Absent  Cervicovaginal ancillary only  Result Value Ref Range   Chlamydia Negative    Neisseria gonorrhea Negative     Labs Reviewed - No data to display  No results found.     ASSESSMENT & PLAN:  1. Dental caries     Meds ordered this encounter  Medications  . amoxicillin (AMOXIL) 875 MG tablet    Sig: Take 1 tablet (875 mg total) by mouth 2 (two) times daily.    Dispense:  20 tablet    Refill:  0  . traMADol (ULTRAM) 50 MG tablet    Sig: Take 1 tablet (50 mg total) by mouth every 6 (six) hours as needed.    Dispense:  10 tablet    Refill:  0    Reviewed expectations re: course of current medical issues. Questions answered. Outlined signs and symptoms indicating need for more acute intervention. Patient verbalized understanding. After Visit Summary given.    Procedures:      Robyn Haber, MD 08/01/17 1217

## 2017-08-01 NOTE — ED Triage Notes (Signed)
Reports right lower toothache x 4 days.  Now c/o right lower jaw swelling and pain into right lateral neck with lymphadenopathy.  Denies fevers.

## 2017-09-13 ENCOUNTER — Encounter (HOSPITAL_COMMUNITY): Payer: Self-pay | Admitting: Emergency Medicine

## 2017-09-13 ENCOUNTER — Ambulatory Visit (HOSPITAL_COMMUNITY)
Admission: EM | Admit: 2017-09-13 | Discharge: 2017-09-13 | Disposition: A | Discharge status: Home / Self Care | Payer: BLUE CROSS/BLUE SHIELD | Attending: Family Medicine | Admitting: Family Medicine

## 2017-09-13 DIAGNOSIS — F17210 Nicotine dependence, cigarettes, uncomplicated: Secondary | ICD-10-CM | POA: Insufficient documentation

## 2017-09-13 DIAGNOSIS — Z833 Family history of diabetes mellitus: Secondary | ICD-10-CM | POA: Insufficient documentation

## 2017-09-13 DIAGNOSIS — Z8249 Family history of ischemic heart disease and other diseases of the circulatory system: Secondary | ICD-10-CM | POA: Insufficient documentation

## 2017-09-13 DIAGNOSIS — N898 Other specified noninflammatory disorders of vagina: Secondary | ICD-10-CM | POA: Diagnosis not present

## 2017-09-13 DIAGNOSIS — N760 Acute vaginitis: Secondary | ICD-10-CM | POA: Diagnosis not present

## 2017-09-13 LAB — POCT URINALYSIS DIP (DEVICE)
Glucose, UA: NEGATIVE mg/dL
Hgb urine dipstick: NEGATIVE
KETONES UR: 40 mg/dL — AB
LEUKOCYTES UA: NEGATIVE
NITRITE: NEGATIVE
PH: 5.5 (ref 5.0–8.0)
Protein, ur: NEGATIVE mg/dL
Specific Gravity, Urine: >=1.03 (ref 1.005–1.030)
Urobilinogen, UA: 0.2 mg/dL (ref 0.0–1.0)

## 2017-09-13 LAB — POCT PREGNANCY, URINE: PREG TEST UR: NEGATIVE

## 2017-09-13 MED ORDER — METRONIDAZOLE 500 MG PO TABS: 500.0000 mg | ORAL_TABLET | Freq: Two times a day (BID) | ORAL | 0 refills | Status: AC

## 2017-09-13 NOTE — ED Provider Notes (Signed)
[place]    CSN: [id number] Arrival date & time: 09/13/17  1645     History   Chief Complaint Chief Complaint  Patient presents with  . Vaginal Discharge  . Abdominal Pain    HPI [name] is a 37 y.o. female.   [name] presents with complaints of vaginal discharge with odor as well as pelvic pressure. Started three days ago. Mild abdominal pressure with urination. No fevers. Some frequency with urination. Low back pain. Used left over flagyl gel last night. No other medications. Has had bv in the past and feels similar. States she would like std screening however. Sexually active with one partner, does not use condoms. No known exposure. [name] 7/20. Not on birth control. No nausea, vomiting or diarrhea. Hx of boils, bv, dental pain, septate uterus, irregular periods.     ROS per HPI.      Past Medical History:  Diagnosis Date  . Abscess   . BOILS, RECURRENT   . Breast tenderness in female   . BV (bacterial vaginosis)   . DENTAL PAIN   . Endometriosis   . FEMALE INFERTILITY   . Gardnerella vaginalis infection   . IRREGULAR MENSTRUATION   . Ovarian cyst   . SEPTATE UTERUS   . Viral labyrinthitis     Patient Active Problem List   Diagnosis Date Noted  . Screen for STD (sexually transmitted disease) 07/09/2017  . Breast mass in female 01/22/2013  . Cervical cancer screening 01/11/2013  . Health care maintenance 07/06/2012  . BV (bacterial vaginosis) 01/18/2012  . General counseling for prescription of oral contraceptives 11/06/2011  . Endometriosis 05/01/2011  . Breast tenderness in female 05/01/2011  . Ovarian cyst   . BOILS, RECURRENT 12/27/2009  . SEPTATE UTERUS 07/24/2009  . TOBACCO ABUSE 09/26/2008  . Female infertility 09/26/2008  . DENTAL PAIN 04/30/2008  . VIRAL LABYRINTHITIS 06/20/2007  . IRREGULAR MENSTRUATION 09/21/2006    Past Surgical History:  Procedure Laterality Date  . biopsy of right breast    . BREAST BIOPSY  1999   right  . OVARIAN CYST REMOVAL  Mar 25 2010  . OVARIAN CYST REMOVAL  03/25/2010   right    OB History    Gravida  0   Para      Term      Preterm      AB      Living  0     SAB      TAB      Ectopic      Multiple      Live Births               Home Medications    Prior to Admission medications   Medication Sig Start Date End Date Taking? Authorizing Provider  metroNIDAZOLE (FLAGYL) 500 MG tablet Take 1 tablet (500 mg total) by mouth 2 (two) times daily for 7 days. 09/13/17 09/20/17  Zigmund Gottron, NP  Norgestimate-Ethinyl Estradiol Triphasic (TRI-SPRINTEC) 0.18/0.215/0.25 MG-35 MCG tablet Take 1 tablet by mouth daily. 11/03/10 05/01/11  Woodroe Mode, MD    Family History Family History  Problem Relation Age of Onset  . Hypertension Mother   . Diabetes Mother   . Cancer Father   . Hypertension Father   . Diabetes Brother   . Hypertension Brother     Social History Social History   Tobacco Use  . Smoking status: Current Every Day Smoker    Packs/day: 0.50  Years: 10.00    Pack years: 5.00    Types: Cigarettes  . Smokeless tobacco: Never Used  Substance Use Topics  . Alcohol use: Yes    Comment: occasionally  . Drug use: No     Allergies   Patient has no known allergies.   Review of Systems Review of Systems   Physical Exam Triage Vital Signs ED Triage Vitals  Enc Vitals Group     BP 09/13/17 1711 103/74     Pulse Rate 09/13/17 1711 85     Resp 09/13/17 1711 16     Temp 09/13/17 1711 98.4 F (36.9 C)     Temp Source 09/13/17 1711 Oral     SpO2 09/13/17 1711 100 %     Weight 09/13/17 1712 178 lb (80.7 kg)     Height --      Head Circumference --      Peak Flow --      Pain Score 09/13/17 1712 4     Pain Loc --      Pain Edu? --      Excl. in [place]? --    No data found.  Updated Vital Signs BP 103/74   Pulse 85   Temp 98.4 F (36.9 C) (Oral)   Resp 16   Wt 178 lb (80.7 kg)   [name] 08/28/2017   SpO2 100%   BMI 32.56  kg/m    Physical Exam  Constitutional: She is oriented to person, place, and time. She appears well-developed and well-nourished. No distress.  Cardiovascular: Normal rate, regular rhythm and normal heart sounds.  Pulmonary/Chest: Effort normal and breath sounds normal.  Abdominal: Soft. Bowel sounds are normal. There is no rigidity, no rebound, no guarding, no CVA tenderness, no tenderness at McBurney's point and negative Murphy's sign.  Genitourinary: Uterus normal. Cervix exhibits no motion tenderness and no friability. Right adnexum displays no tenderness. Left adnexum displays no tenderness. Vaginal discharge found.  Genitourinary Comments: Noted thick white discharge as well as clear gel within vagina   Neurological: She is alert and oriented to person, place, and time.  Skin: Skin is warm and dry.     UC Treatments / Results  Labs (all labs ordered are listed, but only abnormal results are displayed) Labs Reviewed  POCT URINALYSIS DIP (DEVICE) - Abnormal; Notable for the following components:      Result Value   Bilirubin Urine SMALL (*)    Ketones, ur 40 (*)    All other components within normal limits  POCT PREGNANCY, URINE  CERVICOVAGINAL ANCILLARY ONLY    EKG None  Radiology No results found.  Procedures Procedures (including critical care time)  Medications Ordered in UC Medications - No data to display  Initial Impression / Assessment and Plan / UC Course  I have reviewed the triage vital signs and the nursing notes.  Pertinent labs & imaging results that were available during my care of the patient were reviewed by me and considered in my medical decision making (see chart for details).     Vaginal cytology collected. Concern for BV per history and patient reports. She had already used a dose of flagyl gel, concern this may affect sample as there was a decent amount of gel still within vagina. Course of flagyl provided. If symptoms worsen or do not  improve in the next week to return to be seen or to follow up with PCP.  Patient verbalized understanding and agreeable to plan.    Final Clinical  Impressions(s) / UC Diagnoses   Final diagnoses:  Acute vaginitis     Discharge Instructions     We will start antibiotics for you, I am concerned about potential altered results after seeing how much gel was present on exam.  Complete course. Do not drink alcohol while taking.  Please withhold from intercourse for the next week. Please use condoms to prevent STD's.   Will notify you of any positive findings and if any changes to treatment are needed. If symptoms worsen or do not improve in the next week to return to be seen or to follow up with your PCP.        ED Prescriptions    Medication Sig Dispense Auth. Provider   metroNIDAZOLE (FLAGYL) 500 MG tablet Take 1 tablet (500 mg total) by mouth 2 (two) times daily for 7 days. 14 tablet Zigmund Gottron, NP     Controlled Substance Prescriptions [place] Controlled Substance Registry consulted? Not Applicable   Zigmund Gottron, NP 09/13/17 1751

## 2017-09-13 NOTE — Discharge Instructions (Signed)
We will start antibiotics for you, I am concerned about potential altered results after seeing how much gel was present on exam.  Complete course. Do not drink alcohol while taking.  Please withhold from intercourse for the next week. Please use condoms to prevent STD's.   Will notify you of any positive findings and if any changes to treatment are needed. If symptoms worsen or do not improve in the next week to return to be seen or to follow up with your PCP.

## 2017-09-13 NOTE — ED Triage Notes (Signed)
PT reports generalized abdominal pain (worse over lower abdomen). Vaginal discharge, odor, and dysuria as well. PT used flagyl gel once that was left over.

## 2017-09-14 LAB — CERVICOVAGINAL ANCILLARY ONLY
BACTERIAL VAGINITIS: NEGATIVE
CHLAMYDIA, DNA PROBE: NEGATIVE
Candida vaginitis: NEGATIVE
NEISSERIA GONORRHEA: NEGATIVE
Trichomonas: NEGATIVE

## 2017-09-28 ENCOUNTER — Ambulatory Visit (INDEPENDENT_AMBULATORY_CARE_PROVIDER_SITE_OTHER): Payer: BLUE CROSS/BLUE SHIELD | Admitting: Family Medicine

## 2017-09-28 ENCOUNTER — Other Ambulatory Visit (HOSPITAL_COMMUNITY)
Admission: RE | Admit: 2017-09-28 | Discharge: 2017-09-28 | Disposition: A | Discharge status: Home / Self Care | Payer: BLUE CROSS/BLUE SHIELD | Source: Ambulatory Visit | Attending: Family Medicine | Admitting: Family Medicine

## 2017-09-28 ENCOUNTER — Other Ambulatory Visit: Payer: Self-pay

## 2017-09-28 VITALS — BP 108/64 | HR 77 | Temp 98.0°F | Wt 173.0 lb

## 2017-09-28 DIAGNOSIS — Z113 Encounter for screening for infections with a predominantly sexual mode of transmission: Secondary | ICD-10-CM | POA: Insufficient documentation

## 2017-09-28 DIAGNOSIS — M549 Dorsalgia, unspecified: Secondary | ICD-10-CM | POA: Insufficient documentation

## 2017-09-28 LAB — POCT URINALYSIS DIP (MANUAL ENTRY)
Bilirubin, UA: NEGATIVE
GLUCOSE UA: NEGATIVE mg/dL
Ketones, POC UA: NEGATIVE mg/dL
Leukocytes, UA: NEGATIVE
NITRITE UA: NEGATIVE
Protein Ur, POC: NEGATIVE mg/dL
Spec Grav, UA: >=1.03 — AB (ref 1.010–1.025)
UROBILINOGEN UA: 0.2 U/dL
pH, UA: 6 (ref 5.0–8.0)

## 2017-09-28 LAB — POCT WET PREP (WET MOUNT)
CLUE CELLS WET PREP WHIFF POC: NEGATIVE
Trichomonas Wet Prep HPF POC: ABSENT

## 2017-09-28 LAB — POCT UA - MICROSCOPIC ONLY: Epithelial cells, urine per micros: >20

## 2017-09-28 MED ORDER — CYCLOBENZAPRINE HCL 10 MG PO TABS: 5.0000 mg | ORAL_TABLET | Freq: Three times a day (TID) | ORAL | 0 refills | Status: DC | PRN

## 2017-09-28 NOTE — Progress Notes (Signed)
   Subjective:    Patient ID: [name], female    DOB: July 24, 1980, 37 y.o.   MRN: [id number]   CC: STD check and low back pain  HPI: Patient is a 37 yo female who presents today complaining of low back pain and would also like STD check. Patient reports that she has had low back pain for the past 4 days. She denies any history of back pain, trauma or recent injury. She denies any radiation or saddle anesthesia. Patient has tried tylenol, heating pad and topical agent across her lower back without any significant improvement in her symptoms. Patient was recently seen in the ED for vaginal discharge but was negative for BV, yeast or trich. She is at the end of her menstrual cycle. She also denies any fever or chills. NO history of PID or kidney stones.  Smoking status reviewed   ROS: all other systems were reviewed and are negative other than in the HPI   Past Medical History:  Diagnosis Date  . Abscess   . BOILS, RECURRENT   . Breast tenderness in female   . BV (bacterial vaginosis)   . DENTAL PAIN   . Endometriosis   . FEMALE INFERTILITY   . Gardnerella vaginalis infection   . IRREGULAR MENSTRUATION   . Ovarian cyst   . SEPTATE UTERUS   . Viral labyrinthitis     Past Surgical History:  Procedure Laterality Date  . biopsy of right breast    . BREAST BIOPSY  1999   right  . OVARIAN CYST REMOVAL  Mar 25 2010  . OVARIAN CYST REMOVAL  03/25/2010   right    Past medical history, surgical, family, and social history reviewed and updated in the EMR as appropriate.  Objective:  BP 108/64   Pulse 77   Temp 98 F (36.7 C) (Oral)   Wt 173 lb (78.5 kg)   [name] 09/23/2017 (Approximate)   SpO2 99%   BMI 31.64 kg/m   Vitals and nursing note reviewed  General: NAD, pleasant, able to participate in exam Cardiac: RRR, normal heart sounds, no murmurs. 2+ radial and PT pulses bilaterally Respiratory: CTAB, normal effort, No wheezes, rales or rhonchi, no CVA tenderness Abdomen:  soft, nontender, nondistended, no hepatic or splenomegaly, +BS Female genitalia: normal external genitalia, vulva, vagina, cervix, uterus and adnexa Cervix: normal appearing cervix without discharge or lesions  Back exam: Tender to palpation in the lumbar region bilateral no deformities or swelling, no CVA tenderness. Full ROM of motion, negative straight leg raise. Strength and sensation are intact. Neurovascularly intact. Extremities: no edema or cyanosis. WWP. Skin: warm and dry, no rashes noted Neuro: alert and oriented x4, no focal deficits Psych: Normal affect and mood   Assessment & Plan:    Screening examination for STD (sexually transmitted disease) Wet prep, GC/CH, RPR and HIV collected. Will follow up on the results.  Back pain Patient presents with back pain for the past 4 days, no prior history. UA was negative for UTI and no CVA tenderness, and wet prep did not show any BV, trich or yeast. Tender to palpation around the paraspinal muscles and flank. Symptoms are most consistent with muscle sprain. Will prescribe short course flexeril and recommend continuing with heating pad and ibuprofen as needed. Low concern for nephrolithiasis or PID given clinical evaluation.    Marjie Skiff, MD [place] PGY-3

## 2017-09-28 NOTE — Patient Instructions (Signed)
It was great seeing you today! We have addressed the following issues today  1. Back pain is likely secondary to muscle strain. I will give you a muscle relaxer that you will take for a few days. Continue with heating pad and ibuprofen as needed. 2. You do not have a urinary tract infection or bacterial vaginosis. 3. Return if symptoms do not improve.  If we did any lab work today, and the results require attention, either me or my nurse will get in touch with you. If everything is normal, you will get a letter in mail and a message via . If you don't hear from [place] in two weeks, please give [place] a call. Otherwise, we look forward to seeing you again at your next visit. If you have any questions or concerns before then, please call the clinic at [phone].  Please bring all your medications to every doctors visit  Sign up for My Chart to have easy access to your labs results, and communication with your Primary care physician. Please ask Front Desk for some assistance.   Please check-out at the front desk before leaving the clinic.    Take Care,   Dr. Andy Gauss

## 2017-09-28 NOTE — Assessment & Plan Note (Addendum)
Patient presents with back pain for the past 4 days, no prior history. UA was negative for UTI and no CVA tenderness, and wet prep did not show any BV, trich or yeast. Tender to palpation around the paraspinal muscles and flank. Symptoms are most consistent with muscle sprain. Will prescribe short course flexeril and recommend continuing with heating pad and ibuprofen as needed. Low concern for nephrolithiasis or PID given clinical evaluation.

## 2017-09-28 NOTE — Assessment & Plan Note (Signed)
Wet prep, GC/CH, RPR and HIV collected. Will follow up on the results.

## 2017-09-29 ENCOUNTER — Ambulatory Visit: Payer: BLUE CROSS/BLUE SHIELD | Admitting: Family Medicine

## 2017-09-29 LAB — CERVICOVAGINAL ANCILLARY ONLY
CHLAMYDIA, DNA PROBE: NEGATIVE
Chlamydia: NEGATIVE
NEISSERIA GONORRHEA: NEGATIVE
NEISSERIA GONORRHEA: NEGATIVE
TRICH (WINDOWPATH): NEGATIVE

## 2017-09-29 LAB — HIV ANTIBODY (ROUTINE TESTING W REFLEX): HIV Screen 4th Generation wRfx: NONREACTIVE

## 2017-09-29 LAB — RPR: RPR Ser Ql: NONREACTIVE

## 2017-10-04 ENCOUNTER — Other Ambulatory Visit: Payer: Self-pay | Admitting: Family Medicine

## 2017-10-04 ENCOUNTER — Telehealth: Payer: Self-pay | Admitting: Family Medicine

## 2017-10-04 DIAGNOSIS — N63 Unspecified lump in unspecified breast: Secondary | ICD-10-CM

## 2017-10-04 NOTE — Telephone Encounter (Signed)
[phone]-work phone. Pt would like to speak to dr Shan Levans. She wants to know what are the best probiotics for her to take

## 2017-10-04 NOTE — Telephone Encounter (Signed)
Pt called back to see if Dr. Shan Levans had received her message about calling concerning th best probiotics for BV. The pt said the best number to contact is [phone], this is her work number. After 4:00 pm the best number to call is [phone].

## 2017-10-04 NOTE — Telephone Encounter (Signed)
Pt called to let Dr. Shan Levans know that the [facility] called her to schedule her mammogram but said that the order still needs to be placed. Pt's appointment is 11/01/17.

## 2017-10-04 NOTE — Telephone Encounter (Signed)
After discussion with patient that screening does not start until age 37, she says she would like to follow up on a previously benign breast mass, which is fine with me.  Order placed.

## 2017-10-07 NOTE — Telephone Encounter (Signed)
Left VM letting patient know that any probiotic could be used to help prevent bacterial vaginosis, so she can buy whichever option looks best for her.

## 2017-10-08 ENCOUNTER — Other Ambulatory Visit: Payer: Self-pay | Admitting: Family Medicine

## 2017-10-08 ENCOUNTER — Telehealth: Payer: Self-pay | Admitting: Family Medicine

## 2017-10-08 MED ORDER — METRONIDAZOLE 500 MG PO TABS: 500.0000 mg | ORAL_TABLET | Freq: Two times a day (BID) | ORAL | 0 refills | Status: DC

## 2017-10-08 NOTE — Telephone Encounter (Signed)
Patient called and informed that metronidazole has been sent.

## 2017-10-08 NOTE — Telephone Encounter (Signed)
Pt would like to have metrondizole pills called in her BV.  [facility] on [address]. She is at work so please just leave her a message

## 2017-10-22 ENCOUNTER — Encounter (HOSPITAL_COMMUNITY): Payer: Self-pay | Admitting: Emergency Medicine

## 2017-10-22 ENCOUNTER — Ambulatory Visit (HOSPITAL_COMMUNITY)
Admission: EM | Admit: 2017-10-22 | Discharge: 2017-10-22 | Disposition: A | Discharge status: Home / Self Care | Payer: BLUE CROSS/BLUE SHIELD | Attending: Emergency Medicine | Admitting: Emergency Medicine

## 2017-10-22 DIAGNOSIS — L0291 Cutaneous abscess, unspecified: Secondary | ICD-10-CM | POA: Diagnosis not present

## 2017-10-22 MED ORDER — MUPIROCIN 2 % EX OINT: 1.0000 "application " | TOPICAL_OINTMENT | Freq: Two times a day (BID) | CUTANEOUS | 0 refills | Status: DC

## 2017-10-22 MED ORDER — AMOXICILLIN-POT CLAVULANATE 875-125 MG PO TABS: 1.0000 | ORAL_TABLET | Freq: Two times a day (BID) | ORAL | 0 refills | Status: DC

## 2017-10-22 NOTE — ED Provider Notes (Signed)
[place]    CSN: [id number] Arrival date & time: 10/22/17  0940     History   Chief Complaint Chief Complaint  Patient presents with  . Abscess    HPI [name] is a 37 y.o. female.   37 year old female comes in for multiple abscesses. One to the right axilla, one to the left axilla, and one to the groin area. They have been developing since 1 week ago. About 2-3 days ago the abscess to the right axilla and the one to the groin started self draining. She denies fever, chills, night sweats. Denies spreading erythema, warmth. She has been applying warm compress. Has not shaved/waxed for awhile.      Past Medical History:  Diagnosis Date  . Abscess   . BOILS, RECURRENT   . Breast tenderness in female   . BV (bacterial vaginosis)   . DENTAL PAIN   . Endometriosis   . FEMALE INFERTILITY   . Gardnerella vaginalis infection   . IRREGULAR MENSTRUATION   . Ovarian cyst   . SEPTATE UTERUS   . Viral labyrinthitis     Patient Active Problem List   Diagnosis Date Noted  . Back pain 09/28/2017  . Screening examination for STD (sexually transmitted disease) 07/09/2017  . Breast mass in female 01/22/2013  . Cervical cancer screening 01/11/2013  . Health care maintenance 07/06/2012  . BV (bacterial vaginosis) 01/18/2012  . General counseling for prescription of oral contraceptives 11/06/2011  . Endometriosis 05/01/2011  . Breast tenderness in female 05/01/2011  . Ovarian cyst   . BOILS, RECURRENT 12/27/2009  . SEPTATE UTERUS 07/24/2009  . TOBACCO ABUSE 09/26/2008  . Female infertility 09/26/2008  . DENTAL PAIN 04/30/2008  . VIRAL LABYRINTHITIS 06/20/2007  . IRREGULAR MENSTRUATION 09/21/2006    Past Surgical History:  Procedure Laterality Date  . biopsy of right breast    . BREAST BIOPSY  1999   right  . OVARIAN CYST REMOVAL  Mar 25 2010  . OVARIAN CYST REMOVAL  03/25/2010   right    OB History    Gravida  0   Para      Term      Preterm      AB      Living  0     SAB      TAB      Ectopic      Multiple      Live Births               Home Medications    Prior to Admission medications   Medication Sig Start Date End Date Taking? Authorizing Provider  amoxicillin-clavulanate (AUGMENTIN) 875-125 MG tablet Take 1 tablet by mouth every 12 (twelve) hours. 10/22/17   Tasia Catchings, Amy V, PA-C  cyclobenzaprine (FLEXERIL) 10 MG tablet Take 0.5 tablets (5 mg total) by mouth 3 (three) times daily as needed for muscle spasms. Patient not taking: Reported on 10/22/2017 09/28/17   Marjie Skiff, MD  metroNIDAZOLE (FLAGYL) 500 MG tablet Take 1 tablet (500 mg total) by mouth 2 (two) times daily. Patient not taking: Reported on 10/22/2017 10/08/17   Kathrene Alu, MD  mupirocin ointment (BACTROBAN) 2 % Apply 1 application topically 2 (two) times daily. 10/22/17   Tasia Catchings, Amy V, PA-C  Norgestimate-Ethinyl Estradiol Triphasic (TRI-SPRINTEC) 0.18/0.215/0.25 MG-35 MCG tablet Take 1 tablet by mouth daily. 11/03/10 05/01/11  Woodroe Mode, MD    Family History Family History  Problem Relation Age of Onset  .  Hypertension Mother   . Diabetes Mother   . Cancer Father   . Hypertension Father   . Diabetes Brother   . Hypertension Brother     Social History Social History   Tobacco Use  . Smoking status: Current Every Day Smoker    Packs/day: 0.50    Years: 10.00    Pack years: 5.00    Types: Cigarettes  . Smokeless tobacco: Never Used  Substance Use Topics  . Alcohol use: Yes    Comment: occasionally  . Drug use: No     Allergies   Patient has no known allergies.   Review of Systems Review of Systems  Reason unable to perform ROS: See HPI as above.     Physical Exam Triage Vital Signs ED Triage Vitals [10/22/17 1025]  Enc Vitals Group     BP 120/72     Pulse Rate 82     Resp 18     Temp 97.9 F (36.6 C)     Temp Source Oral     SpO2 100 %     Weight      Height      Head Circumference      Peak Flow       Pain Score      Pain Loc      Pain Edu?      Excl. in [place]?    No data found.  Updated Vital Signs BP 120/72 (BP Location: Right Arm)   Pulse 82   Temp 97.9 F (36.6 C) (Oral)   Resp 18   [name] 09/23/2017 (Approximate)   SpO2 100%   Physical Exam  Constitutional: She is oriented to person, place, and time. She appears well-developed and well-nourished. No distress.  HENT:  Head: Normocephalic and atraumatic.  Eyes: Pupils are equal, round, and reactive to light. Conjunctivae are normal.  Neurological: She is alert and oriented to person, place, and time.  Skin: She is not diaphoretic.  Self draining abscess to the right axilla and mons. No surrounding erythema/warmth.   1cm x 1cm abscess to the left axilla with surrounding erythema, no obvious warmth. Fluctuance felt.      UC Treatments / Results  Labs (all labs ordered are listed, but only abnormal results are displayed) Labs Reviewed - No data to display  EKG None  Radiology No results found.  Procedures Procedures (including critical care time)  Medications Ordered in UC Medications - No data to display  Initial Impression / Assessment and Plan / UC Course  I have reviewed the triage vital signs and the nursing notes.  Pertinent labs & imaging results that were available during my care of the patient were reviewed by me and considered in my medical decision making (see chart for details).    Patient declined I&D at this time.  Discussed with patient, abscess may not resolve with antibiotics, and may still need drainage.  Patient expresses understanding, and would like to try antibiotics first.  Start Augmentin as directed, warm compress.  Wound care instructions given.  Return precautions given.  Patient expresses understanding and agrees to plan.  Final Clinical Impressions(s) / UC Diagnoses   Final diagnoses:  Abscess    ED Prescriptions    Medication Sig Dispense Auth. Provider    amoxicillin-clavulanate (AUGMENTIN) 875-125 MG tablet Take 1 tablet by mouth every 12 (twelve) hours. 14 tablet Yu, Amy V, PA-C   mupirocin ointment (BACTROBAN) 2 % Apply 1 application topically 2 (two) times daily. [place]  g Tobin Chad, PA-C 10/22/17 1103

## 2017-10-22 NOTE — ED Triage Notes (Signed)
Pt here for abscess to right axillary area and abscess to pubic area

## 2017-10-22 NOTE — Discharge Instructions (Signed)
As per preference, the abscess to the left underarm is not drained today.  Start Augmentin as directed.  Continue warm compress, just the draining abscesses with Bactroban.  If abscess does not resolve with antibiotics, please return for drainage.

## 2017-11-01 ENCOUNTER — Ambulatory Visit
Admission: RE | Admit: 2017-11-01 | Discharge: 2017-11-01 | Disposition: A | Discharge status: Home / Self Care | Payer: BLUE CROSS/BLUE SHIELD | Source: Ambulatory Visit

## 2017-11-01 ENCOUNTER — Other Ambulatory Visit: Payer: Self-pay | Admitting: Family Medicine

## 2017-11-01 DIAGNOSIS — Z1231 Encounter for screening mammogram for malignant neoplasm of breast: Secondary | ICD-10-CM

## 2017-12-22 ENCOUNTER — Ambulatory Visit (HOSPITAL_COMMUNITY)
Admission: EM | Admit: 2017-12-22 | Discharge: 2017-12-22 | Disposition: A | Discharge status: Home / Self Care | Payer: BLUE CROSS/BLUE SHIELD | Attending: Family Medicine | Admitting: Family Medicine

## 2017-12-22 ENCOUNTER — Other Ambulatory Visit: Payer: Self-pay

## 2017-12-22 ENCOUNTER — Encounter (HOSPITAL_COMMUNITY): Payer: Self-pay | Admitting: Emergency Medicine

## 2017-12-22 DIAGNOSIS — K0889 Other specified disorders of teeth and supporting structures: Secondary | ICD-10-CM | POA: Diagnosis not present

## 2017-12-22 DIAGNOSIS — K047 Periapical abscess without sinus: Secondary | ICD-10-CM

## 2017-12-22 MED ORDER — HYDROCODONE-ACETAMINOPHEN 5-325 MG PO TABS: 1.0000 | ORAL_TABLET | Freq: Every evening | ORAL | 0 refills | Status: DC | PRN

## 2017-12-22 MED ORDER — IBUPROFEN 800 MG PO TABS: 800.0000 mg | ORAL_TABLET | Freq: Three times a day (TID) | ORAL | 0 refills | Status: DC | PRN

## 2017-12-22 MED ORDER — AMOXICILLIN-POT CLAVULANATE 875-125 MG PO TABS: 1.0000 | ORAL_TABLET | Freq: Two times a day (BID) | ORAL | 0 refills | Status: AC

## 2017-12-22 NOTE — ED Triage Notes (Signed)
Pt reports right lower dental pain x2 days.  Pt has been taking Ibuprofen and Tramadol for the pain.

## 2017-12-22 NOTE — Discharge Instructions (Addendum)
Continue with ibuprofen.  Use as directed for pain relief Norco prescribed for severe break-through pain Recommend soft diet until evaluated by dentist Maintain oral hygiene care Follow up with dentist as soon as possible for further evaluation and treatment  Return or go to the ED if you have any new or worsening symptoms such as fever, chills, difficulty swallowing, painful swallowing, oral or neck swelling, nausea, vomiting, chest pain, SOB, etc..Marland Kitchen

## 2017-12-22 NOTE — ED Provider Notes (Addendum)
[place]   [id number] 12/22/17 Arrival Time: 3614  CC: DENTAL PAIN  SUBJECTIVE:  [name] is a 37 y.o. female who presents with dental pain x 2 days.  Denies a precipitating event or trauma.  Localizes pain to right lower side of mouth.  Has tried OTC analgesics without relief.  Worse with chewing.  Does not see a dentist regularly.  Reports similar symptoms in the past.  Denies fever, chills, dysphagia, odynophagia, oral or neck swelling, nausea, vomiting, chest pain, SOB.    ROS: As per HPI.  Past Medical History:  Diagnosis Date  . Abscess   . BOILS, RECURRENT   . Breast tenderness in female   . BV (bacterial vaginosis)   . DENTAL PAIN   . Endometriosis   . FEMALE INFERTILITY   . Gardnerella vaginalis infection   . IRREGULAR MENSTRUATION   . Ovarian cyst   . SEPTATE UTERUS   . Viral labyrinthitis    Past Surgical History:  Procedure Laterality Date  . biopsy of right breast    . BREAST BIOPSY  1999   right  . OVARIAN CYST REMOVAL  Mar 25 2010  . OVARIAN CYST REMOVAL  03/25/2010   right   No Known Allergies No current facility-administered medications on file prior to encounter.    Current Outpatient Medications on File Prior to Encounter  Medication Sig Dispense Refill  . [DISCONTINUED] Norgestimate-Ethinyl Estradiol Triphasic (TRI-SPRINTEC) 0.18/0.215/0.25 MG-35 MCG tablet Take 1 tablet by mouth daily. 1 Package 11   Social History   Socioeconomic History  . Marital status: Single    Spouse name: Not on file  . Number of children: Not on file  . Years of education: Not on file  . Highest education level: Not on file  Occupational History  . Not on file  Social Needs  . Financial resource strain: Not on file  . Food insecurity:    Worry: Not on file    Inability: Not on file  . Transportation needs:    Medical: Not on file    Non-medical: Not on file  Tobacco Use  . Smoking status: Current Every Day Smoker    Packs/day: 0.50   Years: 10.00    Pack years: 5.00    Types: Cigarettes  . Smokeless tobacco: Never Used  Substance and Sexual Activity  . Alcohol use: Yes    Comment: occasionally  . Drug use: No  . Sexual activity: Yes    Birth control/protection: Condom  Lifestyle  . Physical activity:    Days per week: Not on file    Minutes per session: Not on file  . Stress: Not on file  Relationships  . Social connections:    Talks on phone: Not on file    Gets together: Not on file    Attends religious service: Not on file    Active member of club or organization: Not on file    Attends meetings of clubs or organizations: Not on file    Relationship status: Not on file  . Intimate partner violence:    Fear of current or ex partner: Not on file    Emotionally abused: Not on file    Physically abused: Not on file    Forced sexual activity: Not on file  Other Topics Concern  . Not on file  Social History Narrative  . Not on file   Family History  Problem Relation Age of Onset  . Hypertension Mother   . Diabetes  Mother   . Cancer Father   . Hypertension Father   . Diabetes Brother   . Hypertension Brother   . Breast cancer Neg Hx     OBJECTIVE:  Vitals:   12/22/17 0811  BP: 134/87  Pulse: (!) 102  Temp: 98.3 F (36.8 C)  TempSrc: Oral  SpO2: 99%    General appearance: alert; no distress HENT: normocephalic; atraumatic; dentition: poor; poor over right lower gums without areas of fluctuance; crown present right back molar Neck: supple without LAD Lungs: normal respirations; CTAB CV: RRR Skin: warm and dry Psychological: alert and cooperative; normal mood and affect  ASSESSMENT & PLAN:  1. Pain, dental   2. Dental infection     Meds ordered this encounter  Medications  . HYDROcodone-acetaminophen (NORCO/VICODIN) 5-325 MG tablet    Sig: Take 1 tablet by mouth at bedtime as needed for severe pain.    Dispense:  5 tablet    Refill:  0    Order Specific Question:   Supervising  Provider    Answer:   Wynona Luna [phone]  . ibuprofen (ADVIL,MOTRIN) 800 MG tablet    Sig: Take 1 tablet (800 mg total) by mouth every 8 (eight) hours as needed.    Dispense:  30 tablet    Refill:  0    Order Specific Question:   Supervising Provider    Answer:   Wynona Luna [phone]  . amoxicillin-clavulanate (AUGMENTIN) 875-125 MG tablet    Sig: Take 1 tablet by mouth every 12 (twelve) hours for 10 days.    Dispense:  20 tablet    Refill:  0    Order Specific Question:   Supervising Provider    Answer:   Wynona Luna [[id number]]   Antibiotic prescribed for potential dental infection.  Instructed patient to take as directed and to completion Continue with ibuprofen.  Use as directed for pain relief Norco prescribed for severe break-through pain Recommend soft diet until evaluated by dentist Maintain oral hygiene care Follow up with dentist as soon as possible for further evaluation and treatment  Return or go to the ED if you have any new or worsening symptoms such as fever, chills, difficulty swallowing, painful swallowing, oral or neck swelling, nausea, vomiting, chest pain, SOB, etc...  Reviewed expectations re: course of current medical issues. Questions answered. Outlined signs and symptoms indicating need for more acute intervention. Patient verbalized understanding. After Visit Summary given.   Lestine Box, PA-C 12/22/17 [place], [place], PA-C 12/22/17 1029

## 2018-01-24 ENCOUNTER — Encounter: Payer: Self-pay | Admitting: Family Medicine

## 2018-01-24 ENCOUNTER — Ambulatory Visit (INDEPENDENT_AMBULATORY_CARE_PROVIDER_SITE_OTHER): Payer: BLUE CROSS/BLUE SHIELD | Admitting: Family Medicine

## 2018-01-24 ENCOUNTER — Other Ambulatory Visit: Payer: Self-pay

## 2018-01-24 ENCOUNTER — Other Ambulatory Visit (HOSPITAL_COMMUNITY)
Admission: RE | Admit: 2018-01-24 | Discharge: 2018-01-24 | Disposition: A | Discharge status: Home / Self Care | Payer: BLUE CROSS/BLUE SHIELD | Source: Ambulatory Visit | Attending: Family Medicine | Admitting: Family Medicine

## 2018-01-24 VITALS — BP 98/62 | HR 86 | Temp 98.5°F | Ht 62.0 in | Wt 173.6 lb

## 2018-01-24 DIAGNOSIS — M25532 Pain in left wrist: Secondary | ICD-10-CM | POA: Diagnosis not present

## 2018-01-24 DIAGNOSIS — N898 Other specified noninflammatory disorders of vagina: Secondary | ICD-10-CM | POA: Insufficient documentation

## 2018-01-24 DIAGNOSIS — N760 Acute vaginitis: Secondary | ICD-10-CM

## 2018-01-24 DIAGNOSIS — B9689 Other specified bacterial agents as the cause of diseases classified elsewhere: Secondary | ICD-10-CM

## 2018-01-24 DIAGNOSIS — Z113 Encounter for screening for infections with a predominantly sexual mode of transmission: Secondary | ICD-10-CM | POA: Diagnosis not present

## 2018-01-24 LAB — POCT WET PREP (WET MOUNT)
Clue Cells Wet Prep Whiff POC: POSITIVE
TRICHOMONAS WET PREP HPF POC: ABSENT

## 2018-01-24 MED ORDER — METRONIDAZOLE 500 MG PO TABS: 500.0000 mg | ORAL_TABLET | Freq: Two times a day (BID) | ORAL | 0 refills | Status: AC

## 2018-01-24 MED ORDER — NAPROXEN 500 MG PO TABS: 500.0000 mg | ORAL_TABLET | Freq: Two times a day (BID) | ORAL | 0 refills | Status: DC

## 2018-01-24 NOTE — Progress Notes (Signed)
Subjective:    Patient ID: [name], female    DOB: 1980-06-13, 37 y.o.   MRN: [id number]   CC:  HPI: Vaginal Discharge - has been ongoing for 4 days  - Denies itching, burning, abdominal pain, nausea or vomiting - Discharge described as white and thick with some door.  - Patient reports no STD in the past.  - Sexully active with one female partner.  - [name] was on 12/07- nomral.  - Denies burning with urination, no hematuria.  - Patient reports having BV in the past and yeast.  - Patient reports douching and .   L Wrist Pain: Patient has had an issue with her left wrist and hand going to sleep for about 1 year however in the past 2 months she has noticed swelling and increased issues with her hand going to sleep.  Patient denies any trauma.  Reports that the pain and swelling is worse in the morning and will go down later during the day but can also be bad at night.  Reports that her is going to sleep is worse at night. Patient has been trying ibuprofen for the achiness and pain which is helped a little bit.  She has also bought a brace that holds her hand straight to use during the day. Patient reports that the swelling is occurring on the back of her hand as well as the pain. Denies any rashes  Smoking status reviewed  ROS: 10 point ROS is otherwise negative, except as mentioned in HPI  Patient Active Problem List   Diagnosis Date Noted  . Left wrist pain 01/26/2018  . Back pain 09/28/2017  . Screening examination for STD (sexually transmitted disease) 07/09/2017  . Breast mass in female 01/22/2013  . Cervical cancer screening 01/11/2013  . BV (bacterial vaginosis) 01/18/2012  . General counseling for prescription of oral contraceptives 11/06/2011  . Endometriosis 05/01/2011  . Breast tenderness in female 05/01/2011  . Ovarian cyst   . BOILS, RECURRENT 12/27/2009  . SEPTATE UTERUS 07/24/2009  . TOBACCO ABUSE 09/26/2008  . Female infertility 09/26/2008  . DENTAL PAIN  04/30/2008  . VIRAL LABYRINTHITIS 06/20/2007  . IRREGULAR MENSTRUATION 09/21/2006     Objective:  BP 98/62   Pulse 86   Temp 98.5 F (36.9 C) (Oral)   Ht 5\' 2"  (1.575 m)   Wt 173 lb 9.6 oz (78.7 kg)   [name] 01/15/2018 (Exact Date)   SpO2 100%   BMI 31.75 kg/m  Vitals and nursing note reviewed  General: NAD, pleasant Respiratory: normal effort Extremities: no edema or cyanosis. WWP. Left wrist with FROM, some pain with internal rotation no swelling, rash or bruising noted, tender over dorsal aspect of wrist with no palpable mass.  Skin: warm and dry, no rashes noted Neuro: alert and oriented, no focal deficits GU/GYN: External genitalia within normal limits.  Vaginal mucosa pink, moist, normal rugae.  Nonfriable cervix without lesions, no discharge or bleeding noted on speculum exam.  Bimanual exam revealed normal, nongravid uterus.  No cervical motion tenderness. No adnexal masses bilaterally. Exam performed in the presence of a chaperone. Psych: normal affect  Assessment & Plan:    BV (bacterial vaginosis) confirmed on wet prep. G/C Marveen Reeks is pending. Symptoms consistent with this.  - Treatment with Flagyl 500 BID x 7 days. - F/U if symptoms not improving or getting worse.  - Will f/u on G/C Chlamydia and call in Rx if positive.  - RPR and HIV negative - Self care  instructions given including avoiding douching. Handout given.  - F/U with PCP as needed.  - Return precautions including abdominal pain, fever, chills, nausea, or vomiting given.    Left wrist pain Left wrist pain is likely multifactorial with patient having symptoms of carpal tunnel over the past year which has also worsened in the past 2 months.  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  No evidence of fracture which would require x-ray.  No palpable mass or cyst on exam and no swelling at this time.  However patient reports that the  swelling goes down throughout the day. -For pain we will start patient on naproxen 500 mg twice daily for 2 weeks. -Patient also shown and instructed on proper brace to use for carpal tunnel and instructed to wear this nightly.  Patient will need dorsal wrist brace for carpal tunnel symptoms.  Patient reports that she will not be able to afford this at this time as she had just bought another expensive brace to wear.  However she will use this as soon as she is able to afford it. -Patient may require further evaluation by sports medicine if pain not resolved after regularly scheduled NSAIDs for 2 weeks.    [place] Shahab Polhamus, DO Family Medicine Resident PGY-2

## 2018-01-24 NOTE — Patient Instructions (Addendum)
Thank you for coming to see me today. It was a pleasure! Today we talked about:   Your wrist pain. I have naproxen 500 mg to your pharmacy. Take this every day for 2 weeks twice per day. Use a dorsal carpal tunnel wrist splint nightly for your numbness and tingling.   Please follow-up with your regular doctor in 2 weeks or sooner as needed.  If you have any questions or concerns, please do not hesitate to call the office at [phone].  Take Care,   [place] Rhoderick Farrel, DO   Carpal Tunnel Syndrome Carpal tunnel syndrome is a condition that causes pain in your hand and arm. The carpal tunnel is a narrow area that is on the palm side of your wrist. Repeated wrist motion or certain diseases may cause swelling in the tunnel. This swelling can pinch the main nerve in the wrist (median nerve). Follow these instructions at home: If you have a splint:  Wear it as told by your doctor. Remove it only as told by your doctor.  Loosen the splint if your fingers: ? Become numb and tingle. ? Turn blue and cold.  Keep the splint clean and dry. General instructions  Take over-the-counter and prescription medicines only as told by your doctor.  Rest your wrist from any activity that may be causing your pain. If needed, talk to your employer about changes that can be made in your work, such as getting a wrist pad to use while typing.  If directed, apply ice to the painful area: ? Put ice in a plastic bag. ? Place a towel between your skin and the bag. ? Leave the ice on for 20 minutes, 2-3 times per day.  Keep all follow-up visits as told by your doctor. This is important.  Do any exercises as told by your doctor, physical therapist, or occupational therapist. Contact a doctor if:  You have new symptoms.  Medicine does not help your pain.  Your symptoms get worse. This information is not intended to replace advice given to you by your health care provider. Make sure you discuss any  questions you have with your health care provider. Document Released: 01/15/2011 Document Revised: 07/04/2015 Document Reviewed: 06/13/2014 Elsevier Interactive Patient Education  2018 [employer]   Bacterial Vaginosis Bacterial vaginosis is an infection of the vagina. It happens when too many germs (bacteria) grow in the vagina. This infection puts you at risk for infections from sex (STIs). Treating this infection can lower your risk for some STIs. You should also treat this if you are pregnant. It can cause your baby to be born early. Follow these instructions at home: Medicines  Take over-the-counter and prescription medicines only as told by your doctor.  Take or use your antibiotic medicine as told by your doctor. Do not stop taking or using it even if you start to feel better. General instructions  If you your sexual partner is a woman, tell her that you have this infection. She needs to get treatment if she has symptoms. If you have a female partner, he does not need to be treated.  During treatment: ? Avoid sex. ? Do not douche. ? Avoid alcohol as told. ? Avoid breastfeeding as told.  Drink enough fluid to keep your pee (urine) clear or pale yellow.  Keep your vagina and butt (rectum) clean. ? Wash the area with warm water every day. ? Wipe from front to back after you use the toilet.  Keep all follow-up visits  as told by your doctor. This is important. Preventing this condition  Do not douche.  Use only warm water to wash around your vagina.  Use protection when you have sex. This includes: ? Latex condoms. ? Dental dams.  Limit how many people you have sex with. It is best to only have sex with the same person (be monogamous).  Get tested for STIs. Have your partner get tested.  Wear underwear that is cotton or lined with cotton.  Avoid tight pants and pantyhose. This is most important in summer.  Do not use any products that have nicotine or tobacco in  them. These include cigarettes and e-cigarettes. If you need help quitting, ask your doctor.  Do not use illegal drugs.  Limit how much alcohol you drink. Contact a doctor if:  Your symptoms do not get better, even after you are treated.  You have more discharge or pain when you pee (urinate).  You have a fever.  You have pain in your belly (abdomen).  You have pain with sex.  Your bleed from your vagina between periods. Summary  This infection happens when too many germs (bacteria) grow in the vagina.  Treating this condition can lower your risk for some infections from sex (STIs).  You should also treat this if you are pregnant. It can cause early (premature) birth.  Do not stop taking or using your antibiotic medicine even if you start to feel better. This information is not intended to replace advice given to you by your health care provider. Make sure you discuss any questions you have with your health care provider. Document Released: 11/05/2007 Document Revised: 10/12/2015 Document Reviewed: 10/12/2015 Elsevier Interactive Patient Education  2017 [employer].

## 2018-01-25 LAB — HIV ANTIBODY (ROUTINE TESTING W REFLEX): HIV Screen 4th Generation wRfx: NONREACTIVE

## 2018-01-25 LAB — RPR: RPR: NONREACTIVE

## 2018-01-26 ENCOUNTER — Telehealth: Payer: Self-pay | Admitting: Family Medicine

## 2018-01-26 DIAGNOSIS — M25532 Pain in left wrist: Secondary | ICD-10-CM | POA: Insufficient documentation

## 2018-01-26 NOTE — Telephone Encounter (Signed)
Pt called to RN line.   Advised that HIV, RPR and trich were negative.   Wet prep show BV and Md called in medication (flagyl) for her.  Advised not to drink alcohol while on this med.  GC not back yet, will call when we receive. Catalina Salasar, Salome Spotted, CMA

## 2018-01-26 NOTE — Telephone Encounter (Signed)
Thank you :)

## 2018-01-26 NOTE — Assessment & Plan Note (Addendum)
confirmed on wet prep. G/C Marveen Reeks is pending. Symptoms consistent with this.  - Treatment with Flagyl 500 BID x 7 days. - F/U if symptoms not improving or getting worse.  - Will f/u on G/C Chlamydia and call in Rx if positive.  - RPR and HIV negative - Self care instructions given including avoiding douching. Handout given.  - F/U with PCP as needed.  - Return precautions including abdominal pain, fever, chills, nausea, or vomiting given.

## 2018-01-26 NOTE — Telephone Encounter (Signed)
Pt calling saying she got a MyChart message with her results from her visit on Monday that she did not understand. I do not see any results sent out. Spoke with April and she did not see any results sent out either. Told pt we would send a message to Dr. Enid Derry who she saw this visit. Please call pt back with these results.

## 2018-01-26 NOTE — Assessment & Plan Note (Addendum)
Left wrist pain is likely multifactorial with patient having symptoms of carpal tunnel over the past year which has also worsened in the past 2 months.  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  No evidence of fracture which would require x-ray.  No palpable mass or cyst on exam and no swelling at this time.  However patient reports that the swelling goes down throughout the day. -For pain we will start patient on naproxen 500 mg twice daily for 2 weeks. -Patient also shown and instructed on proper brace to use for carpal tunnel and instructed to wear this nightly.  Patient will need dorsal wrist brace for carpal tunnel symptoms.  Patient reports that she will not be able to afford this at this time as she had just bought another expensive brace to wear.  However she will use this as soon as she is able to afford it. -Patient may require further evaluation by sports medicine if pain not resolved after regularly scheduled NSAIDs for 2 weeks.

## 2018-01-28 LAB — CERVICOVAGINAL ANCILLARY ONLY
Chlamydia: NEGATIVE
Neisseria Gonorrhea: NEGATIVE

## 2018-01-28 NOTE — Telephone Encounter (Signed)
Pt lm on nurse line inquiring about STD results.  She ask [place] to lm if she does not answer.  There was an issue with her MRN number. The lab is working on getting this fixed.but we did receive a hardcopy of the result.   Both Gonorrhea and chlamydia were negative.   LMOVM informing pt of results. Fleeger, Salome Spotted, CMA

## 2018-04-04 ENCOUNTER — Other Ambulatory Visit: Payer: Self-pay

## 2018-04-04 ENCOUNTER — Encounter (HOSPITAL_COMMUNITY): Payer: Self-pay

## 2018-04-04 ENCOUNTER — Ambulatory Visit (HOSPITAL_COMMUNITY)
Admission: EM | Admit: 2018-04-04 | Discharge: 2018-04-04 | Disposition: A | Discharge status: Home / Self Care | Payer: 59 | Attending: Physician Assistant | Admitting: Physician Assistant

## 2018-04-04 DIAGNOSIS — N898 Other specified noninflammatory disorders of vagina: Secondary | ICD-10-CM | POA: Diagnosis not present

## 2018-04-04 MED ORDER — FLUCONAZOLE 150 MG PO TABS: 150.0000 mg | ORAL_TABLET | Freq: Every day | ORAL | 0 refills | Status: DC

## 2018-04-04 MED ORDER — METRONIDAZOLE 500 MG PO TABS: 500.0000 mg | ORAL_TABLET | Freq: Two times a day (BID) | ORAL | 0 refills | Status: DC

## 2018-04-04 MED ORDER — ACETAMINOPHEN 325 MG PO TABS
ORAL_TABLET | ORAL | Status: AC
  Filled 2018-04-04: qty 2

## 2018-04-04 NOTE — ED Triage Notes (Signed)
Pt cc thinks she has a tampon and she would like to be checked for STDs. Pt states she has a little stomach pain x 2 days.

## 2018-04-04 NOTE — Discharge Instructions (Signed)
No foreign body seen. You were treated empirically for BV and yeast. Flagyl and diflucan as directed.. Cytology sent, you will be contacted with any positive results that requires further treatment. Refrain from sexual activity and alcohol use for the next 7 days. Monitor for any worsening of symptoms, fever, abdominal pain, nausea, vomiting, to follow up for reevaluation.

## 2018-04-04 NOTE — ED Provider Notes (Signed)
[facility]    CSN: [id number] Arrival date & time: 04/04/18  0757     History   Chief Complaint Chief Complaint  Patient presents with  . SEXUALLY TRANSMITTED DISEASE    HPI [name] is a 38 y.o. female.   38 year old female comes in for few day history of vaginal discharge, itching, suprapubic abdominal cramping. Patient states she just finished her cycle 3 days ago and noticed discharge. She would like to make sure there is no tampon/condoms in the vaginal canal given mild suprapubic cramping. States cramping is intermittent, no obvious aggravating or alleviating factor. Denies nausea/vomiting. Denies fever, chills, night sweats. Denies urinary frequency, dysuria, hematuria. [name] 03/27/2018. Sexually active with one female partner, condom use.      Past Medical History:  Diagnosis Date  . Abscess   . BOILS, RECURRENT   . Breast tenderness in female   . BV (bacterial vaginosis)   . DENTAL PAIN   . Endometriosis   . FEMALE INFERTILITY   . Gardnerella vaginalis infection   . IRREGULAR MENSTRUATION   . Ovarian cyst   . SEPTATE UTERUS   . Viral labyrinthitis     Patient Active Problem List   Diagnosis Date Noted  . Left wrist pain 01/26/2018  . Back pain 09/28/2017  . Screening examination for STD (sexually transmitted disease) 07/09/2017  . Breast mass in female 01/22/2013  . Cervical cancer screening 01/11/2013  . BV (bacterial vaginosis) 01/18/2012  . General counseling for prescription of oral contraceptives 11/06/2011  . Endometriosis 05/01/2011  . Breast tenderness in female 05/01/2011  . Ovarian cyst   . BOILS, RECURRENT 12/27/2009  . SEPTATE UTERUS 07/24/2009  . TOBACCO ABUSE 09/26/2008  . Female infertility 09/26/2008  . DENTAL PAIN 04/30/2008  . VIRAL LABYRINTHITIS 06/20/2007  . IRREGULAR MENSTRUATION 09/21/2006    Past Surgical History:  Procedure Laterality Date  . biopsy of right breast    . BREAST BIOPSY  1999   right  .  OVARIAN CYST REMOVAL  Mar 25 2010  . OVARIAN CYST REMOVAL  03/25/2010   right    OB History    Gravida  0   Para      Term      Preterm      AB      Living  0     SAB      TAB      Ectopic      Multiple      Live Births               Home Medications    Prior to Admission medications   Medication Sig Start Date End Date Taking? Authorizing Provider  fluconazole (DIFLUCAN) 150 MG tablet Take 1 tablet (150 mg total) by mouth daily. Take second dose 72 hours later if symptoms still persists. 04/04/18   Ok Edwards, PA-C  HYDROcodone-acetaminophen (NORCO/VICODIN) 5-325 MG tablet Take 1 tablet by mouth at bedtime as needed for severe pain. 12/22/17   Wurst, [place], PA-C  ibuprofen (ADVIL,MOTRIN) 800 MG tablet Take 1 tablet (800 mg total) by mouth every 8 (eight) hours as needed. 12/22/17   Wurst, [place], PA-C  metroNIDAZOLE (FLAGYL) 500 MG tablet Take 1 tablet (500 mg total) by mouth 2 (two) times daily. 04/04/18   Tasia Catchings, Amy V, PA-C  naproxen (NAPROSYN) 500 MG tablet Take 1 tablet (500 mg total) by mouth 2 (two) times daily with a meal. 01/24/18   Shirley, [place], DO  Norgestimate-Ethinyl Estradiol Triphasic (TRI-SPRINTEC) 0.18/0.215/0.25 MG-35 MCG tablet Take 1 tablet by mouth daily. 11/03/10 05/01/11  Woodroe Mode, MD    Family History Family History  Problem Relation Age of Onset  . Hypertension Mother   . Diabetes Mother   . Cancer Father   . Hypertension Father   . Diabetes Brother   . Hypertension Brother   . Breast cancer Neg Hx     Social History Social History   Tobacco Use  . Smoking status: Current Every Day Smoker    Packs/day: 0.50    Years: 10.00    Pack years: 5.00    Types: Cigarettes  . Smokeless tobacco: Never Used  Substance Use Topics  . Alcohol use: Yes    Comment: occasionally  . Drug use: No     Allergies   Patient has no known allergies.   Review of Systems Review of Systems  Reason unable to perform ROS: See HPI as  above.     Physical Exam Triage Vital Signs ED Triage Vitals  Enc Vitals Group     BP 04/04/18 0822 108/81     Pulse Rate 04/04/18 0822 86     Resp 04/04/18 0822 18     Temp 04/04/18 0822 98.4 F (36.9 C)     Temp Source 04/04/18 0822 Oral     SpO2 04/04/18 0822 100 %     Weight 04/04/18 0818 175 lb (79.4 kg)     Height --      Head Circumference --      Peak Flow --      Pain Score 04/04/18 0818 2     Pain Loc --      Pain Edu? --      Excl. in [place]? --    No data found.  Updated Vital Signs BP 108/81 (BP Location: Right Arm)   Pulse 86   Temp 98.4 F (36.9 C) (Oral)   Resp 18   Wt 175 lb (79.4 kg)   [name] 04/01/2018   SpO2 100%   BMI 32.01 kg/m   Physical Exam Exam conducted with a chaperone present.  Constitutional:      General: She is not in acute distress.    Appearance: She is well-developed. She is not ill-appearing, toxic-appearing or diaphoretic.  HENT:     Head: Normocephalic and atraumatic.  Eyes:     Conjunctiva/sclera: Conjunctivae normal.     Pupils: Pupils are equal, round, and reactive to light.  Cardiovascular:     Rate and Rhythm: Normal rate and regular rhythm.     Heart sounds: Normal heart sounds. No murmur. No friction rub. No gallop.   Pulmonary:     Effort: Pulmonary effort is normal.     Breath sounds: Normal breath sounds. No wheezing or rales.  Abdominal:     General: Bowel sounds are normal.     Palpations: Abdomen is soft.     Tenderness: There is no abdominal tenderness. There is no guarding or rebound.  Genitourinary:    Vagina: Vaginal discharge present.     Cervix: Normal.     Comments: No foreign body seen.  Skin:    General: Skin is warm and dry.  Neurological:     Mental Status: She is alert and oriented to person, place, and time.  Psychiatric:        Behavior: Behavior normal.        Judgment: Judgment normal.      UC Treatments / Results  Labs (all labs ordered are listed, but only abnormal results are  displayed) Labs Reviewed  CERVICOVAGINAL ANCILLARY ONLY    EKG None  Radiology No results found.  Procedures Procedures (including critical care time)  Medications Ordered in UC Medications - No data to display  Initial Impression / Assessment and Plan / UC Course  I have reviewed the triage vital signs and the nursing notes.  Pertinent labs & imaging results that were available during my care of the patient were reviewed by me and considered in my medical decision making (see chart for details).    No foreign body seen in vaginal canal. Patient was treated empirically for BV and yeast. Start flagyl, diflucan as directed. Cytology sent, patient will be contacted with any positive results that require additional treatment. Patient to refrain from sexual activity for the next 7 days. Return precautions given.   Final Clinical Impressions(s) / UC Diagnoses   Final diagnoses:  Vaginal discharge    ED Prescriptions    Medication Sig Dispense Auth. Provider   metroNIDAZOLE (FLAGYL) 500 MG tablet Take 1 tablet (500 mg total) by mouth 2 (two) times daily. 14 tablet Yu, Amy V, PA-C   fluconazole (DIFLUCAN) 150 MG tablet Take 1 tablet (150 mg total) by mouth daily. Take second dose 72 hours later if symptoms still persists. 2 tablet Tobin Chad, [place] 04/04/18 [phone]

## 2018-04-05 LAB — CERVICOVAGINAL ANCILLARY ONLY
Chlamydia: NEGATIVE
Neisseria Gonorrhea: NEGATIVE
Trichomonas: NEGATIVE

## 2018-05-12 ENCOUNTER — Ambulatory Visit (INDEPENDENT_AMBULATORY_CARE_PROVIDER_SITE_OTHER): Payer: 59 | Admitting: Family Medicine

## 2018-05-12 ENCOUNTER — Other Ambulatory Visit: Payer: Self-pay | Admitting: Family Medicine

## 2018-05-12 ENCOUNTER — Other Ambulatory Visit: Payer: Self-pay

## 2018-05-12 VITALS — BP 110/72 | HR 94 | Temp 98.6°F | Wt 171.2 lb

## 2018-05-12 DIAGNOSIS — N644 Mastodynia: Secondary | ICD-10-CM | POA: Diagnosis not present

## 2018-05-12 MED ORDER — IBUPROFEN 600 MG PO TABS: 600.0000 mg | ORAL_TABLET | Freq: Three times a day (TID) | ORAL | 0 refills | Status: DC | PRN

## 2018-05-12 NOTE — Patient Instructions (Signed)
Fibrocystic Breast Changes    Fibrocystic breast changes are changes that can make your breasts swollen or painful. These changes happen when tiny sacs of fluid (cysts) form in the breast. This is a common condition. It does not mean that you have cancer. It usually happens because of hormone changes during a monthly period.  Follow these instructions at home:   Check your breasts after every monthly period. If you do not have monthly periods, check your breasts on the first day of every month. Check for:  ? Soreness.  ? New swelling or puffiness.  ? A change in breast size.  ? A change in a lump that was already there.   Take over-the-counter and prescription medicines only as told by your doctor.   Wear a support or sports bra that fits well. Wear this support especially when you are exercising.   Avoid or have less caffeine, fat, and sugar in what you eat and drink as told by your doctor.  Contact a doctor if:   You have fluid coming from your nipple, especially if the fluid has blood in it.   You have new lumps or bumps in your breast.   Your breast gets puffy, red, and painful.   You have changes in how your breast looks.   Your nipple looks flat or it sinks into your breast.  Get help right away if:   Your breast turns red, and the redness is spreading.  Summary   Fibrocystic breast changes are changes that can make your breasts swollen or painful.   This condition can happen when you have hormone changes during your monthly period.   With this condition, it is important to check your breasts after every monthly period. If you do not have monthly periods, check your breasts on the first day of every month.  This information is not intended to replace advice given to you by your health care provider. Make sure you discuss any questions you have with your health care provider.  Document Released: 01/09/2008 Document Revised: 10/10/2015 Document Reviewed: 10/10/2015  Elsevier Interactive Patient  Education  2019 [employer]

## 2018-05-12 NOTE — Progress Notes (Signed)
Subjective: Chief Complaint  Patient presents with  . Breast Pain    Lt     HPI: [name] is a 38 y.o. presenting to clinic today to discuss the following:  Breast Pain Bilateral (L>R) Patient has a history of breat pain and known fibrocystic breast changes. She states she was told in the past "you have lumpy breast". She has had multiple mammograms and a breast biopsy of her right breast all of which were significant for benign breast fibrocystic changes. She presents today because of breast pain that has not improved and typically "it just goes away". She is having more pain in her left breast than the right. She denies any skin changes or discharge from her nipple.   She denies fever, chills, nausea, vomiting, diarrhea, or joint pain  ROS noted in HPI.   Past Medical, Surgical, Social, and Family History Reviewed & Updated per EMR.   Pertinent Historical Findings include:   Social History   Tobacco Use  Smoking Status Current Every Day Smoker  . Packs/day: 0.50  . Years: 10.00  . Pack years: 5.00  . Types: Cigarettes  Smokeless Tobacco Never Used   Objective: BP 110/72   Pulse 94   Temp 98.6 F (37 C) (Oral)   Wt 171 lb 4 oz (77.7 kg)   [name] 04/26/2018   SpO2 99%   BMI 31.32 kg/m  Vitals and nursing notes reviewed  Physical Exam Gen: Alert and Oriented x 3, NAD HEENT: Normocephalic, atraumatic Breast: Multiple, hard lumps in each breast that are TTP, no skin changes or thickening of the skin or "orange peeling" noted on exam, breasts are symmetrical in size and shape, no discharge from the nipple, no swelling. Skin: warm, dry, intact, no rashes  *A chaperone was used during the breast exam   Assessment/Plan:  Breast pain in female Given no concerning findings on exam today and patient history of imaging and biopsy all with benign findings I will treat her for fibrocystic breast changes. However, given I have never examined this patient before I  cannot be sure she has not had any significant breast changes since her last imaging studies were done. Therefore, cannot rule out breast cancer and will order some imaging today. - Bilateral breast U/S to look for any acute changes to her breast and get further recommendations. Patient states she is already scheduled for a mammogram at age 63. - Tylenol and Ibuprofen 600mg  TID as needed for pain - F/u after imaging   PATIENT EDUCATION PROVIDED: See AVS    Diagnosis and plan along with any newly prescribed medication(s) were discussed in detail with this patient today. The patient verbalized understanding and agreed with the plan. Patient advised if symptoms worsen return to clinic or ER.   Health Maintainance:   Orders Placed This Encounter  Procedures  . [place] Unlisted Procedure Breast    Bilateral U/S of breasts due to fibrocystic changes    Standing Status:   Future    Standing Expiration Date:   08/11/2018    Order Specific Question:   Reason for Exam (SYMPTOM  OR DIAGNOSIS REQUIRED)    Answer:   breast pain    Order Specific Question:   Preferred imaging location?    Answer:   [facility]    Meds ordered this encounter  Medications  . ibuprofen (ADVIL,MOTRIN) 600 MG tablet    Sig: Take 1 tablet (600 mg total) by mouth every 8 (eight) hours as  needed for moderate pain.    Dispense:  30 tablet    Refill:  0     Harolyn Rutherford, DO 05/12/2018, 4:21 PM PGY-2 [place]

## 2018-05-12 NOTE — Assessment & Plan Note (Signed)
Given no concerning findings on exam today and patient history of imaging and biopsy all with benign findings I will treat her for fibrocystic breast changes. However, given I have never examined this patient before I cannot be sure she has not had any significant breast changes since her last imaging studies were done. Therefore, cannot rule out breast cancer and will order some imaging today. - Bilateral breast U/S to look for any acute changes to her breast and get further recommendations. Patient states she is already scheduled for a mammogram at age 4. - Tylenol and Ibuprofen 600mg  TID as needed for pain - F/u after imaging

## 2018-05-27 ENCOUNTER — Other Ambulatory Visit: Payer: Self-pay

## 2018-05-27 ENCOUNTER — Ambulatory Visit
Admission: RE | Admit: 2018-05-27 | Discharge: 2018-05-27 | Disposition: A | Discharge status: Home / Self Care | Payer: 59 | Source: Ambulatory Visit | Attending: Family Medicine | Admitting: Family Medicine

## 2018-05-27 DIAGNOSIS — N644 Mastodynia: Secondary | ICD-10-CM

## 2018-05-30 ENCOUNTER — Telehealth: Payer: Self-pay | Admitting: Family Medicine

## 2018-05-30 DIAGNOSIS — L0293 Carbuncle, unspecified: Secondary | ICD-10-CM

## 2018-05-30 MED ORDER — DOXYCYCLINE HYCLATE 100 MG PO TABS: 100.0000 mg | ORAL_TABLET | Freq: Two times a day (BID) | ORAL | 0 refills | Status: AC

## 2018-05-30 MED ORDER — MUPIROCIN CALCIUM 2 % EX CREA: 1.0000 "application " | TOPICAL_CREAM | Freq: Two times a day (BID) | CUTANEOUS | 0 refills | Status: DC

## 2018-05-30 NOTE — Telephone Encounter (Signed)
Called nursing line to request antibiotic, discussed with patient on the phone.     States about 4 days ago she developed a boil under her right arm about the size of a golf ball per patient report.  Has not increased in size since onset.  She has a long-term history of recurrent boils, mainly in her underarms and in groin region, happen every couple of weeks.  Area is somewhat sore, mildly erythematous but feels this is likely secondary to the warm compress she recently applied.  Denies any fever, rash, warmth to touch, or drainage in the area.  She has been doing warm compresses twice daily and ibuprofen on occasion.  Last received "some type" of antibiotic a few months ago for this, would like an antibiotic today, no allergies.  Does not feel like it needs to be drained yet and cannot get off of her job for an appointment.   Will send in a 7-day course of doxycycline 100 mg twice daily and some mupirocin ointment.  Recommended continue warm compresses at least twice daily, more frequent if possible.  Hopefully will resolve and drain on its own, however instructed patient to follow-up if the region is not improving or worsening with strict precautions discussed including monitoring for infectious signs.  Kerri Clan, DO   Meds ordered this encounter  Medications  . doxycycline (VIBRA-TABS) 100 MG tablet    Sig: Take 1 tablet (100 mg total) by mouth 2 (two) times daily for 7 days.    Dispense:  14 tablet    Refill:  0  . mupirocin cream (BACTROBAN) 2 %    Sig: Apply 1 application topically 2 (two) times daily.    Dispense:  15 g    Refill:  0

## 2018-07-17 ENCOUNTER — Encounter (HOSPITAL_COMMUNITY): Payer: Self-pay

## 2018-07-17 ENCOUNTER — Ambulatory Visit (HOSPITAL_COMMUNITY)
Admission: EM | Admit: 2018-07-17 | Discharge: 2018-07-17 | Disposition: A | Discharge status: Home / Self Care | Payer: BLUE CROSS/BLUE SHIELD | Attending: Family Medicine | Admitting: Family Medicine

## 2018-07-17 ENCOUNTER — Other Ambulatory Visit: Payer: Self-pay

## 2018-07-17 DIAGNOSIS — L02412 Cutaneous abscess of left axilla: Secondary | ICD-10-CM | POA: Insufficient documentation

## 2018-07-17 MED ORDER — DOXYCYCLINE HYCLATE 100 MG PO CAPS: 100.0000 mg | ORAL_CAPSULE | Freq: Two times a day (BID) | ORAL | 0 refills | Status: DC

## 2018-07-17 NOTE — Discharge Instructions (Addendum)
We will call you the results of your HIV testing.  Keep area clean and dry. Use hot compresses 3-4 times daily 5 minutes each time. Take antibiotic as prescribed. Avoid deodorant/antiperspirant for the next week. Follow-up with your primary care to discuss possible surgical intervention as these have been recurrent. Avoid antiperspirants: Look for deodorants with our aluminum. Avoid wearing underwire bras as this could irritate the area further.

## 2018-07-17 NOTE — ED Provider Notes (Addendum)
[place]    CSN: [id number] Arrival date & time: 07/17/18  1016     History   Chief Complaint Chief Complaint  Patient presents with  . Abscess    HPI [name] is a 38 y.o. female with remote history of current boils in left axilla presenting for acute concern of left axillary abscess.  Patient states that symptoms started 3 to 4 days ago.  Patient endorses swelling, fluctuance , pain, redness.  Patient has tried which hazel, hot compresses, ibuprofen without adequate relief of symptoms.  Patient denies active discharge, trauma to the area, fever, shoulder pain, upper extremity paresthesias.  He says she has had I&D's before, typically needs to have antibiotics for complete resolution.,  Patient requesting HIV screening "since having done in a long time ".  Patient denies new sexual partners, known HIV infection, vaginal discharge or malodor.    Past Medical History:  Diagnosis Date  . Abscess   . BOILS, RECURRENT   . Breast tenderness in female   . BV (bacterial vaginosis)   . DENTAL PAIN   . Endometriosis   . FEMALE INFERTILITY   . Gardnerella vaginalis infection   . IRREGULAR MENSTRUATION   . Ovarian cyst   . SEPTATE UTERUS   . Viral labyrinthitis     Patient Active Problem List   Diagnosis Date Noted  . Left wrist pain 01/26/2018  . Back pain 09/28/2017  . Screening examination for STD (sexually transmitted disease) 07/09/2017  . Breast mass in female 01/22/2013  . Cervical cancer screening 01/11/2013  . BV (bacterial vaginosis) 01/18/2012  . General counseling for prescription of oral contraceptives 11/06/2011  . Endometriosis 05/01/2011  . Breast pain in female 05/01/2011  . Ovarian cyst   . BOILS, RECURRENT 12/27/2009  . SEPTATE UTERUS 07/24/2009  . TOBACCO ABUSE 09/26/2008  . Female infertility 09/26/2008  . DENTAL PAIN 04/30/2008  . VIRAL LABYRINTHITIS 06/20/2007  . IRREGULAR MENSTRUATION 09/21/2006    Past Surgical History:   Procedure Laterality Date  . biopsy of right breast    . BREAST BIOPSY  1999   right  . OVARIAN CYST REMOVAL  Mar 25 2010  . OVARIAN CYST REMOVAL  03/25/2010   right    OB History    Gravida  0   Para      Term      Preterm      AB      Living  0     SAB      TAB      Ectopic      Multiple      Live Births               Home Medications    Prior to Admission medications   Medication Sig Start Date End Date Taking? Authorizing Provider  fluconazole (DIFLUCAN) 150 MG tablet Take 1 tablet (150 mg total) by mouth daily. Take second dose 72 hours later if symptoms still persists. 04/04/18   Ok Edwards, PA-C  HYDROcodone-acetaminophen (NORCO/VICODIN) 5-325 MG tablet Take 1 tablet by mouth at bedtime as needed for severe pain. 12/22/17   Wurst, [place], PA-C  ibuprofen (ADVIL,MOTRIN) 600 MG tablet Take 1 tablet (600 mg total) by mouth every 8 (eight) hours as needed for moderate pain. 05/12/18   Nuala Alpha, DO  ibuprofen (ADVIL,MOTRIN) 800 MG tablet Take 1 tablet (800 mg total) by mouth every 8 (eight) hours as needed. 12/22/17   Wurst, [place], PA-C  metroNIDAZOLE (  FLAGYL) 500 MG tablet Take 1 tablet (500 mg total) by mouth 2 (two) times daily. 04/04/18   Tasia Catchings, Amy V, PA-C  mupirocin cream (BACTROBAN) 2 % Apply 1 application topically 2 (two) times daily. 05/30/18   [name], DO  naproxen (NAPROSYN) 500 MG tablet Take 1 tablet (500 mg total) by mouth 2 (two) times daily with a meal. 01/24/18   Enid Derry, [place], DO  Norgestimate-Ethinyl Estradiol Triphasic (TRI-SPRINTEC) 0.18/0.215/0.25 MG-35 MCG tablet Take 1 tablet by mouth daily. 11/03/10 05/01/11  Woodroe Mode, MD    Family History Family History  Problem Relation Age of Onset  . Hypertension Mother   . Diabetes Mother   . Cancer Father   . Hypertension Father   . Diabetes Brother   . Hypertension Brother   . Breast cancer Neg Hx     Social History Social History   Tobacco Use  . Smoking  status: Current Every Day Smoker    Packs/day: 0.50    Years: 10.00    Pack years: 5.00    Types: Cigarettes  . Smokeless tobacco: Never Used  Substance Use Topics  . Alcohol use: Yes    Comment: occasionally  . Drug use: No     Allergies   Patient has no known allergies.   Review of Systems As per HPI   Physical Exam Triage Vital Signs ED Triage Vitals  Enc Vitals Group     BP 07/17/18 1100 115/82     Pulse Rate 07/17/18 1100 92     Resp 07/17/18 1100 18     Temp 07/17/18 1100 98.3 F (36.8 C)     Temp Source 07/17/18 1100 Oral     SpO2 07/17/18 1100 100 %     Weight 07/17/18 1102 178 lb (80.7 kg)     Height --      Head Circumference --      Peak Flow --      Pain Score 07/17/18 1102 10     Pain Loc --      Pain Edu? --      Excl. in [place]? --    No data found.  Updated Vital Signs BP 115/82 (BP Location: Right Arm)   Pulse 92   Temp 98.3 F (36.8 C) (Oral)   Resp 18   Wt 178 lb (80.7 kg)   [name] 07/10/2018   SpO2 100%   BMI 32.56 kg/m   Visual Acuity Right Eye Distance:   Left Eye Distance:   Bilateral Distance:    Right Eye Near:   Left Eye Near:    Bilateral Near:     Physical Exam Constitutional:      General: She is not in acute distress. HENT:     Head: Normocephalic and atraumatic.  Eyes:     General: No scleral icterus.    Pupils: Pupils are equal, round, and reactive to light.  Cardiovascular:     Rate and Rhythm: Normal rate.  Pulmonary:     Effort: Pulmonary effort is normal.  Skin:    Coloration: Skin is not jaundiced or pale.     Comments: Large (7 cm) abscess of axilla underlying erythema, no streaking.  Scant superficial skin breakdown, no active discharge appreciated.  Area is exquisitely tender to palpation with central area of fluctuance  Neurological:     Mental Status: She is alert and oriented to person, place, and time.      UC Treatments / Results  Labs (all labs ordered  are listed, but only abnormal results  are displayed) Labs Reviewed - No data to display  EKG None  Radiology No results found.  Procedures Incision and Drainage Date/Time: 07/17/2018 11:14 AM Performed by: Quincy Sheehan, PA-C Authorized by: Raylene Everts, MD   Consent:    Consent obtained:  Verbal   Consent given by:  Patient   Risks discussed:  Bleeding, incomplete drainage, pain and damage to other organs   Alternatives discussed:  No treatment Universal protocol:    Patient identity confirmed:  Verbally with patient Location:    Type:  Abscess Pre-procedure details:    Skin preparation:  Betadine Anesthesia (see MAR for exact dosages):    Anesthesia method:  Local infiltration   Local anesthetic:  Lidocaine 2% WITH epi Procedure type:    Complexity:  Complex Procedure details:    Incision types:  Single straight   Incision depth:  Subcutaneous   Scalpel blade:  11   Wound management:  Probed and deloculated, irrigated with saline and extensive cleaning   Drainage:  Purulent, bloody and serous   Drainage amount:  Copious   Packing materials:  None Post-procedure details:    Patient tolerance of procedure:  Tolerated with difficulty   (including critical care time)  Medications Ordered in UC Medications - No data to display  Initial Impression / Assessment and Plan / UC Course  I have reviewed the triage vital signs and the nursing notes.  Pertinent labs & imaging results that were available during my care of the patient were reviewed by me and considered in my medical decision making (see chart for details).     38 year old female with history of recurrent left axillary abscess presenting for new abscess.  I&D performed in office with copious purulent discharge.  Patient tolerated procedure with some difficulty, wound was not packed.  Patient to take doxycycline and follow-up with PCP. Final Clinical Impressions(s) / UC Diagnoses   Final diagnoses:  None   Discharge Instructions    None    ED Prescriptions    None     Controlled Substance Prescriptions [place] Controlled Substance Registry consulted? Not Applicable   Quincy Sheehan, PA-C 07/17/18 1147    Hall-Potvin, [place], [place] 07/17/18 1157

## 2018-07-17 NOTE — ED Triage Notes (Signed)
Pt cc has a boil underneath her left arm. X 5 day.

## 2018-07-18 LAB — HIV ANTIBODY (ROUTINE TESTING W REFLEX): HIV Screen 4th Generation wRfx: NONREACTIVE

## 2018-07-18 LAB — RPR: RPR Ser Ql: NONREACTIVE

## 2018-07-21 ENCOUNTER — Telehealth: Payer: Self-pay | Admitting: Family Medicine

## 2018-07-21 ENCOUNTER — Telehealth (HOSPITAL_COMMUNITY): Payer: Self-pay | Admitting: Emergency Medicine

## 2018-07-21 MED ORDER — VALACYCLOVIR HCL 500 MG PO TABS: 500.0000 mg | ORAL_TABLET | Freq: Two times a day (BID) | ORAL | 0 refills | Status: AC

## 2018-07-21 MED ORDER — FLUCONAZOLE 150 MG PO TABS: 150.0000 mg | ORAL_TABLET | Freq: Once | ORAL | 0 refills | Status: AC

## 2018-07-21 NOTE — Telephone Encounter (Signed)
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 Pt states she was diagnosed with herpes a while back and is asking for meds to be called in to treat it. Pt says she has a spot on her vaginal area and has been putting stuff on it but it doesn't seem to be working. [facility] is out until next week and pt has to have appt after 4 PM due to work. Transferred to nurse line to see if something could be done sooner.

## 2018-07-21 NOTE — Telephone Encounter (Signed)
Patient calls nurse line stating she was diagnosed with Herpes last year (07/09/2017.) Patient stated she has an active open lesion, very uncomfortable when she urinates. Patient is requesting valtrex to be sent to her pharmacy. Please advise.

## 2018-07-21 NOTE — Telephone Encounter (Signed)
Pt called saying the doxy is giving her a yeast infection, requesting medicine. Sent to pt preferred pharmacy.

## 2018-07-21 NOTE — Telephone Encounter (Signed)
I have sent in 500mg  to take twice daily for 3 days. She should take as soon as possible and should avoid intercourse.

## 2018-07-21 NOTE — Telephone Encounter (Signed)
Pt would like for Dr. Shan Levans to give her a call back. Pt says it's important, but was at work and could not tell me what it was concerning. Please call patient back at [phone]. Please advise

## 2018-07-21 NOTE — Telephone Encounter (Signed)
From previous note, does patient have an appointment after 4 pm? She can be seen by video/telemedicine visit. I can  send her prescription for valtrex, but would recommended virtual visit if able.

## 2018-07-22 ENCOUNTER — Ambulatory Visit: Payer: 59 | Admitting: Student in an Organized Health Care Education/Training Program

## 2018-07-22 NOTE — Telephone Encounter (Signed)
The best treatment is to take the antiviral medication that was sent in. She should have a telemedicine encounter or some sort of encounter if she does not notice some improvement.

## 2018-07-22 NOTE — Telephone Encounter (Signed)
Patient calling nurse line again. Patient is asking for a some type of cream that can be applied to open sore. Barrier protection?

## 2018-08-23 ENCOUNTER — Ambulatory Visit (HOSPITAL_COMMUNITY)
Admission: EM | Admit: 2018-08-23 | Discharge: 2018-08-23 | Disposition: A | Discharge status: Home / Self Care | Payer: BLUE CROSS/BLUE SHIELD | Attending: Emergency Medicine | Admitting: Emergency Medicine

## 2018-08-23 ENCOUNTER — Encounter (HOSPITAL_COMMUNITY): Payer: Self-pay | Admitting: Urgent Care

## 2018-08-23 ENCOUNTER — Other Ambulatory Visit: Payer: Self-pay

## 2018-08-23 DIAGNOSIS — N760 Acute vaginitis: Secondary | ICD-10-CM | POA: Insufficient documentation

## 2018-08-23 DIAGNOSIS — B9689 Other specified bacterial agents as the cause of diseases classified elsewhere: Secondary | ICD-10-CM | POA: Insufficient documentation

## 2018-08-23 DIAGNOSIS — Z113 Encounter for screening for infections with a predominantly sexual mode of transmission: Secondary | ICD-10-CM | POA: Insufficient documentation

## 2018-08-23 LAB — POCT URINALYSIS DIP (DEVICE)
Glucose, UA: NEGATIVE mg/dL
Hgb urine dipstick: NEGATIVE
Ketones, ur: 15 mg/dL — AB
Leukocytes,Ua: NEGATIVE
Nitrite: NEGATIVE
Protein, ur: 30 mg/dL — AB
Specific Gravity, Urine: 1.025 (ref 1.005–1.030)
Urobilinogen, UA: 0.2 mg/dL (ref 0.0–1.0)
pH: 7 (ref 5.0–8.0)

## 2018-08-23 MED ORDER — METRONIDAZOLE 500 MG PO TABS: 500.0000 mg | ORAL_TABLET | Freq: Two times a day (BID) | ORAL | 0 refills | Status: AC

## 2018-08-23 NOTE — Discharge Instructions (Addendum)
I am going to treat you as if you have BV.  We will call you if any of your other labs come up positive requiring treatment.  I am sending your urine off for culture to make sure you do not have a UTI.  If it comes back positive for UTI, we will call in the appropriate antibiotics.  May take ibuprofen combined with Tylenol 3-4 times a day as needed for pain.  Go to the ER for fevers above 100.4, if your abdominal pain gets worse, or for any other concerns.

## 2018-08-23 NOTE — ED Triage Notes (Signed)
Pt  states she needs a STD.  Pt states she would like to have HIV testing.

## 2018-08-23 NOTE — ED Provider Notes (Signed)
HPI  SUBJECTIVE:  [name] is a 38 y.o. female who presents with 3 days of fishy vaginal odor and white discharge.  She reports nonmigratory midline low abdominal pain described as crampy, sharp, lasting seconds.  It is not getting any worse.  States it radiated into her right lower flank last night.  Reports bilateral mild low back pain.  She reports nausea, but no vomiting, fevers, dysuria, urinary urgency.  She reports increased frequency of urination, but states that she has been drinking more fluids than normal.  No cloudy, odorous urine, hematuria.  No vaginal itching, rash, labial swelling.  No recent antibiotics.  No antipyretic in the past 4 to 6 hours.  She is in a monogamous relationship with a female who is asymptomatic, however she is requesting STD testing including HIV, RPR.  She has tried cranberry juice without improvement in her symptoms.  There are no other aggravating or alleviating factors.  She has a past medical history of herpes, BV, yeast, PID, UTI.  No history of gonorrhea, chlamydia, HIV, syphilis, trichomonas, ectopic pregnancy, diabetes, pyelonephritis, nephrolithiasis.  [name]: 6/20.  Denies the possibility being pregnant and states that we do not need to check.  PMD: She is planning to establish care at [facility] family practice.   Past Medical History:  Diagnosis Date  . Abscess   . BOILS, RECURRENT   . Breast tenderness in female   . BV (bacterial vaginosis)   . DENTAL PAIN   . Endometriosis   . FEMALE INFERTILITY   . Gardnerella vaginalis infection   . IRREGULAR MENSTRUATION   . Ovarian cyst   . SEPTATE UTERUS   . Viral labyrinthitis     Past Surgical History:  Procedure Laterality Date  . biopsy of right breast    . BREAST BIOPSY  1999   right  . OVARIAN CYST REMOVAL  Mar 25 2010  . OVARIAN CYST REMOVAL  03/25/2010   right    Family History  Problem Relation Age of Onset  . Hypertension Mother   . Diabetes Mother   . Cancer Father   . Hypertension  Father   . Diabetes Brother   . Hypertension Brother   . Breast cancer Neg Hx     Social History   Tobacco Use  . Smoking status: Current Every Day Smoker    Packs/day: 0.50    Years: 10.00    Pack years: 5.00    Types: Cigarettes  . Smokeless tobacco: Never Used  Substance Use Topics  . Alcohol use: Yes    Comment: occasionally  . Drug use: No    No current facility-administered medications for this encounter.   Current Outpatient Medications:  .  metroNIDAZOLE (FLAGYL) 500 MG tablet, Take 1 tablet (500 mg total) by mouth 2 (two) times daily for 7 days., Disp: 14 tablet, Rfl: 0  No Known Allergies   ROS  As noted in HPI.   Physical Exam  BP 117/64 (BP Location: Right Arm)   Pulse 100   Temp 98.6 F (37 C)   Resp 18   Wt 77.1 kg   [name] 08/03/2018   SpO2 100%   BMI 31.09 kg/m   Constitutional: Well developed, well nourished, no acute distress Eyes:  EOMI, conjunctiva normal bilaterally HENT: Normocephalic, atraumatic,mucus membranes moist Respiratory: Normal inspiratory effort Cardiovascular: Normal rate GI: nondistended soft, nontender.  Positive suprapubic tenderness, mild right flank tenderness.  No left lower quadrant, right lower quadrant pain. back: No CVA tenderness GU: External  genitalia normal.  Normal vaginal mucosa.  Normal os. thick oderous white vaginal discharge  Uterus smooth, NT. No  CMT. No  adnexal tenderness. No adnexal masses.  Chaperone present during exam skin: No rash, skin intact Musculoskeletal: no deformities Neurologic: Alert & oriented x 3, no focal neuro deficits Psychiatric: Speech and behavior appropriate   ED Course   Medications - No data to display  Orders Placed This Encounter  Procedures  . Pelvic exam    Standing Status:   Standing    Number of Occurrences:   1  . Urine culture    Standing Status:   Standing    Number of Occurrences:   1    Order Specific Question:   Patient immune status    Answer:   Normal   . RPR    Standing Status:   Standing    Number of Occurrences:   1  . HIV antibody    Standing Status:   Standing    Number of Occurrences:   1  . POCT urinalysis dip (device)    Standing Status:   Standing    Number of Occurrences:   1    Results for orders placed or performed during the hospital encounter of 08/23/18 (from the past 24 hour(s))  POCT urinalysis dip (device)     Status: Abnormal   Collection Time: 08/23/18  8:33 AM  Result Value Ref Range   Glucose, UA NEGATIVE NEGATIVE mg/dL   Bilirubin Urine SMALL (A) NEGATIVE   Ketones, ur 15 (A) NEGATIVE mg/dL   Specific Gravity, Urine 1.025 1.005 - 1.030   Hgb urine dipstick NEGATIVE NEGATIVE   pH 7.0 5.0 - 8.0   Protein, ur 30 (A) NEGATIVE mg/dL   Urobilinogen, UA 0.2 0.0 - 1.0 mg/dL   Nitrite NEGATIVE NEGATIVE   Leukocytes,Ua NEGATIVE NEGATIVE   No results found.  ED Clinical Impression  1. BV (bacterial vaginosis)   2. Screening examination for STD (sexually transmitted disease)    ED Assessment/Plan  Previous labs reviewed.  History of BV.  RPR, HIV negative in June 2020  Urine dip negative for UTI.  Some ketones and small bilirubin,.  H&P most c/w  BV. Sent off GC/chlamydia, wet prep, HIV, RPR. Will not treat empirically now-waiting for labs. Will send home with flagyl for presumed BV.   Urine culture because she has a suprapubic and flank tenderness to confirm absence of UTI.  She has no evidence of PID on exam.  Advised pt to refrain from sexual contact until she  knows lab results, symptoms resolve, and partner(s) are treated if necessary. Pt provided working phone number. Follow-up with PMD as needed. Discussed labs, MDM, plan and followup with patient. Pt agrees with plan.   Meds ordered this encounter  Medications  . metroNIDAZOLE (FLAGYL) 500 MG tablet    Sig: Take 1 tablet (500 mg total) by mouth 2 (two) times daily for 7 days.    Dispense:  14 tablet    Refill:  0    *This clinic note was created  using [employer]. Therefore, there may be occasional mistakes despite careful proofreading.  ?     Melynda Ripple, MD 08/23/18 1037

## 2018-08-24 LAB — CERVICOVAGINAL ANCILLARY ONLY
Bacterial vaginitis: POSITIVE — AB
Candida vaginitis: POSITIVE — AB
Chlamydia: NEGATIVE
Neisseria Gonorrhea: NEGATIVE
Trichomonas: NEGATIVE

## 2018-08-24 LAB — HIV ANTIBODY (ROUTINE TESTING W REFLEX): HIV Screen 4th Generation wRfx: NONREACTIVE

## 2018-08-24 LAB — RPR: RPR Ser Ql: NONREACTIVE

## 2018-08-26 ENCOUNTER — Encounter (HOSPITAL_COMMUNITY): Payer: Self-pay

## 2018-08-26 ENCOUNTER — Telehealth (HOSPITAL_COMMUNITY): Payer: Self-pay | Admitting: Emergency Medicine

## 2018-08-26 MED ORDER — FLUCONAZOLE 150 MG PO TABS: 150.0000 mg | ORAL_TABLET | Freq: Once | ORAL | 0 refills | Status: AC

## 2018-08-26 NOTE — Telephone Encounter (Signed)
Bacterial Vaginosis test is positive. Prescription for metronidazole was given at the urgent care visit. Pt contacted regarding results. Answered all questions. Verbalized understanding.   Test for candida (yeast) was positive. Prescription for fluconazole 150mg  po now, repeat dose in 3d if needed, #2 no refills, sent to the pharmacy of record. Recheck or followup with PCP for further evaluation if symptoms are not improving.    Attempted to reach patient. No answer at this time. Voicemail left.

## 2018-08-26 NOTE — Telephone Encounter (Signed)
Patient contacted and made aware of swab results, all questions answered.

## 2018-10-11 ENCOUNTER — Encounter (HOSPITAL_COMMUNITY): Payer: Self-pay

## 2018-10-11 ENCOUNTER — Ambulatory Visit (HOSPITAL_COMMUNITY)
Admission: EM | Admit: 2018-10-11 | Discharge: 2018-10-11 | Disposition: A | Discharge status: Home / Self Care | Payer: BLUE CROSS/BLUE SHIELD | Attending: Emergency Medicine | Admitting: Emergency Medicine

## 2018-10-11 ENCOUNTER — Other Ambulatory Visit: Payer: Self-pay

## 2018-10-11 DIAGNOSIS — N898 Other specified noninflammatory disorders of vagina: Secondary | ICD-10-CM

## 2018-10-11 DIAGNOSIS — Z202 Contact with and (suspected) exposure to infections with a predominantly sexual mode of transmission: Secondary | ICD-10-CM

## 2018-10-11 MED ORDER — METRONIDAZOLE 500 MG PO TABS: 500.0000 mg | ORAL_TABLET | Freq: Two times a day (BID) | ORAL | 0 refills | Status: DC

## 2018-10-11 MED ORDER — FLUCONAZOLE 150 MG PO TABS: 150.0000 mg | ORAL_TABLET | Freq: Every day | ORAL | 0 refills | Status: DC

## 2018-10-11 NOTE — ED Provider Notes (Signed)
[place]    CSN: [id number] Arrival date & time: 10/11/18  1655      History   Chief Complaint Chief Complaint  Patient presents with  . Appointment  . (5:30 Vaginitis , BV )    HPI [name] is a 38 y.o. female.   Patient presents with creamy white vaginal discharge x1 week.  She denies fever, chills, abdominal pain, dysuria, back pain, pelvic pain, or other symptoms.  She has a history of bacterial vaginosis and candida on 08/23/2018;.  [name]: 10/01/2018.  The history is provided by the patient.    Past Medical History:  Diagnosis Date  . Abscess   . BOILS, RECURRENT   . Breast tenderness in female   . BV (bacterial vaginosis)   . DENTAL PAIN   . Endometriosis   . FEMALE INFERTILITY   . Gardnerella vaginalis infection   . IRREGULAR MENSTRUATION   . Ovarian cyst   . SEPTATE UTERUS   . Viral labyrinthitis     Patient Active Problem List   Diagnosis Date Noted  . Left wrist pain 01/26/2018  . Back pain 09/28/2017  . Screening examination for STD (sexually transmitted disease) 07/09/2017  . Breast mass in female 01/22/2013  . Cervical cancer screening 01/11/2013  . BV (bacterial vaginosis) 01/18/2012  . General counseling for prescription of oral contraceptives 11/06/2011  . Endometriosis 05/01/2011  . Breast pain in female 05/01/2011  . Ovarian cyst   . BOILS, RECURRENT 12/27/2009  . SEPTATE UTERUS 07/24/2009  . TOBACCO ABUSE 09/26/2008  . Female infertility 09/26/2008  . DENTAL PAIN 04/30/2008  . VIRAL LABYRINTHITIS 06/20/2007  . IRREGULAR MENSTRUATION 09/21/2006    Past Surgical History:  Procedure Laterality Date  . biopsy of right breast    . BREAST BIOPSY  1999   right  . OVARIAN CYST REMOVAL  Mar 25 2010  . OVARIAN CYST REMOVAL  03/25/2010   right    OB History    Gravida  0   Para      Term      Preterm      AB      Living  0     SAB      TAB      Ectopic      Multiple      Live Births                Home Medications    Prior to Admission medications   Medication Sig Start Date End Date Taking? Authorizing Provider  fluconazole (DIFLUCAN) 150 MG tablet Take 1 tablet (150 mg total) by mouth daily. Take 1 tablet today.  Repeat in 3 days if needed. 10/11/18   Sharion Balloon, NP  metroNIDAZOLE (FLAGYL) 500 MG tablet Take 1 tablet (500 mg total) by mouth 2 (two) times daily. 10/11/18   Sharion Balloon, NP  Norgestimate-Ethinyl Estradiol Triphasic (TRI-SPRINTEC) 0.18/0.215/0.25 MG-35 MCG tablet Take 1 tablet by mouth daily. 11/03/10 05/01/11  Woodroe Mode, MD    Family History Family History  Problem Relation Age of Onset  . Hypertension Mother   . Diabetes Mother   . Cancer Father   . Hypertension Father   . Diabetes Brother   . Hypertension Brother   . Breast cancer Neg Hx     Social History Social History   Tobacco Use  . Smoking status: Current Every Day Smoker    Packs/day: 0.50    Years: 10.00    Pack  years: 5.00    Types: Cigarettes  . Smokeless tobacco: Never Used  Substance Use Topics  . Alcohol use: Yes    Comment: occasionally  . Drug use: No     Allergies   Patient has no known allergies.   Review of Systems Review of Systems  Constitutional: Negative for chills and fever.  HENT: Negative for ear pain and sore throat.   Eyes: Negative for pain and visual disturbance.  Respiratory: Negative for cough and shortness of breath.   Cardiovascular: Negative for chest pain and palpitations.  Gastrointestinal: Negative for abdominal pain and vomiting.  Genitourinary: Positive for vaginal discharge. Negative for dysuria, flank pain, hematuria and pelvic pain.  Musculoskeletal: Negative for arthralgias and back pain.  Skin: Negative for color change and rash.  Neurological: Negative for seizures and syncope.  All other systems reviewed and are negative.    Physical Exam Triage Vital Signs ED Triage Vitals  Enc Vitals Group     BP      Pulse      Resp       Temp      Temp src      SpO2      Weight      Height      Head Circumference      Peak Flow      Pain Score      Pain Loc      Pain Edu?      Excl. in [place]?    No data found.  Updated Vital Signs BP 100/71 (BP Location: Left Arm)   Pulse 94   Temp 98.2 F (36.8 C) (Oral)   Resp 18   [name] 10/06/2018   SpO2 97%   Visual Acuity Right Eye Distance:   Left Eye Distance:   Bilateral Distance:    Right Eye Near:   Left Eye Near:    Bilateral Near:     Physical Exam Vitals signs and nursing note reviewed.  Constitutional:      General: She is not in acute distress.    Appearance: She is well-developed.  HENT:     Head: Normocephalic and atraumatic.     Mouth/Throat:     Mouth: Mucous membranes are moist.     Pharynx: Oropharynx is clear.  Eyes:     Conjunctiva/sclera: Conjunctivae normal.  Neck:     Musculoskeletal: Neck supple.  Cardiovascular:     Rate and Rhythm: Normal rate and regular rhythm.     Heart sounds: No murmur.  Pulmonary:     Effort: Pulmonary effort is normal. No respiratory distress.     Breath sounds: Normal breath sounds.  Abdominal:     General: Bowel sounds are normal.     Palpations: Abdomen is soft.     Tenderness: There is no abdominal tenderness. There is no right CVA tenderness, left CVA tenderness, guarding or rebound.  Genitourinary:    Labia:        Right: No rash or tenderness.        Left: No rash or tenderness.      Vagina: Vaginal discharge present.     Cervix: No cervical motion tenderness.     Uterus: Normal.      Adnexa: Right adnexa normal and left adnexa normal.     Comments: Moderate amount of thick white vaginal discharge. Skin:    General: Skin is warm and dry.     Findings: No rash.  Neurological:     Mental  Status: She is alert.      UC Treatments / Results  Labs (all labs ordered are listed, but only abnormal results are displayed) Labs Reviewed - No data to display  EKG   Radiology No results found.   Procedures Procedures (including critical care time)  Medications Ordered in UC Medications - No data to display  Initial Impression / Assessment and Plan / UC Course  I have reviewed the triage vital signs and the nursing notes.  Pertinent labs & imaging results that were available during my care of the patient were reviewed by me and considered in my medical decision making (see chart for details).    Vaginal discharge, possible exposure to STD.  Treating with metronidazole and Diflucan.  STD test pending; patient declines other treatment today.  Instructed patient not to have sex for 7 days.  Discussed that we will call her if her test results are positive and that she and her partner may need additional treatment at that time.  Discussed with patient that she should return here or follow-up with her primary care provider if she has continued vaginal discharge or develops abdominal pain, pelvic pain, flank pain, dysuria, fever, chills, or other symptoms.  Patient agrees to plan of care. Final Clinical Impressions(s) / UC Diagnoses   Final diagnoses:  Vaginal discharge  Possible exposure to STD     Discharge Instructions     Take the metronidazole and fluconazole as prescribed.    Your STD tests are pending.  Do not have sex until the test results come back.  If your test results are positive, we will call you.  You may need additional treatment and your partner may also need treatment.   Return here or go to the emergency department if you develop worsening symptoms, or new symptoms such as fever, chills, abdominal pain, difficulty with urination, back pain, pelvic pain.         ED Prescriptions    Medication Sig Dispense Auth. Provider   metroNIDAZOLE (FLAGYL) 500 MG tablet Take 1 tablet (500 mg total) by mouth 2 (two) times daily. 14 tablet Sharion Balloon, NP   fluconazole (DIFLUCAN) 150 MG tablet Take 1 tablet (150 mg total) by mouth daily. Take 1 tablet today.  Repeat in  3 days if needed. 2 tablet Sharion Balloon, NP     Controlled Substance Prescriptions [place] Controlled Substance Registry consulted? Not Applicable   Sharion Balloon, NP 10/11/18 [place]

## 2018-10-11 NOTE — ED Triage Notes (Signed)
Pt presents for STD testing; pt states she has vaginal discharge and discomfort.  Pt has a HX of BV

## 2018-10-11 NOTE — Discharge Instructions (Signed)
Take the metronidazole and fluconazole as prescribed.    Your STD tests are pending.  Do not have sex until the test results come back.  If your test results are positive, we will call you.  You may need additional treatment and your partner may also need treatment.   Return here or go to the emergency department if you develop worsening symptoms, or new symptoms such as fever, chills, abdominal pain, difficulty with urination, back pain, pelvic pain.

## 2018-10-13 ENCOUNTER — Telehealth (HOSPITAL_COMMUNITY): Payer: Self-pay | Admitting: Emergency Medicine

## 2018-10-13 LAB — CERVICOVAGINAL ANCILLARY ONLY
Bacterial vaginitis: POSITIVE — AB
Candida vaginitis: NEGATIVE
Chlamydia: NEGATIVE
Neisseria Gonorrhea: NEGATIVE
Trichomonas: NEGATIVE

## 2018-10-13 NOTE — Telephone Encounter (Signed)
Bacterial Vaginosis test is positive.  Prescription for metronidazole was given at the urgent care visit. Patient contacted and made aware of    results, all questions answered

## 2018-10-27 ENCOUNTER — Other Ambulatory Visit: Payer: Self-pay

## 2018-10-27 ENCOUNTER — Other Ambulatory Visit: Payer: Self-pay | Admitting: Internal Medicine

## 2018-10-27 ENCOUNTER — Ambulatory Visit (HOSPITAL_COMMUNITY)
Admission: EM | Admit: 2018-10-27 | Discharge: 2018-10-27 | Disposition: A | Discharge status: Home / Self Care | Payer: BLUE CROSS/BLUE SHIELD | Attending: Internal Medicine | Admitting: Internal Medicine

## 2018-10-27 DIAGNOSIS — K2990 Gastroduodenitis, unspecified, without bleeding: Secondary | ICD-10-CM | POA: Diagnosis present

## 2018-10-27 DIAGNOSIS — B9689 Other specified bacterial agents as the cause of diseases classified elsewhere: Secondary | ICD-10-CM | POA: Diagnosis present

## 2018-10-27 DIAGNOSIS — K2970 Gastritis, unspecified, without bleeding: Secondary | ICD-10-CM | POA: Diagnosis not present

## 2018-10-27 DIAGNOSIS — N760 Acute vaginitis: Secondary | ICD-10-CM | POA: Diagnosis present

## 2018-10-27 MED ORDER — CLINDAMYCIN HCL 300 MG PO CAPS: 300.0000 mg | ORAL_CAPSULE | Freq: Two times a day (BID) | ORAL | 0 refills | Status: AC

## 2018-10-27 MED ORDER — PANTOPRAZOLE SODIUM 20 MG PO TBEC: 20.0000 mg | DELAYED_RELEASE_TABLET | Freq: Two times a day (BID) | ORAL | 0 refills | Status: DC

## 2018-10-27 MED ORDER — ALUM & MAG HYDROXIDE-SIMETH 200-200-20 MG/5ML PO SUSP
ORAL | Status: AC
  Filled 2018-10-27: qty 30

## 2018-10-27 MED ORDER — LIDOCAINE VISCOUS HCL 2 % MT SOLN
OROMUCOSAL | Status: AC
  Filled 2018-10-27: qty 15

## 2018-10-27 MED ORDER — ALUM & MAG HYDROXIDE-SIMETH 200-200-20 MG/5ML PO SUSP
30.0000 mL | Freq: Once | ORAL | Status: AC
  Administered 2018-10-27: 30 mL via ORAL

## 2018-10-27 MED ORDER — LIDOCAINE VISCOUS HCL 2 % MT SOLN
15.0000 mL | Freq: Once | OROMUCOSAL | Status: AC
  Administered 2018-10-27: 15 mL via ORAL

## 2018-10-27 NOTE — ED Triage Notes (Signed)
Pt states that she has been trying to take her meds for BV. Pt states the med makes her stomach hurt. Pt states she would like to be tested for STD's.

## 2018-10-27 NOTE — ED Provider Notes (Signed)
[place]    CSN: [id number] Arrival date & time: 10/27/18  [id number]      History   Chief Complaint Chief Complaint  Patient presents with  . Abdominal Pain    HPI [name] is a 38 y.o. female with history of bacterial vaginosis comes to be in urgent care with complaints of epigastric pain of several days duration.  Patient was seen here and treated for bacterial vaginosis with metronidazole.  After she started taking metronidazole she started having some abdominal pain.  Pain is of moderate severity and mainly in the epigastric region.  Is associated with nausea but no vomiting.  No known relieving factors.  She took a dose of ibuprofen for abdominal pain but did not help.  No weight loss.  No dark stools.Marland Kitchen   HPI  Past Medical History:  Diagnosis Date  . Abscess   . BOILS, RECURRENT   . Breast tenderness in female   . BV (bacterial vaginosis)   . DENTAL PAIN   . Endometriosis   . FEMALE INFERTILITY   . Gardnerella vaginalis infection   . IRREGULAR MENSTRUATION   . Ovarian cyst   . SEPTATE UTERUS   . Viral labyrinthitis     Patient Active Problem List   Diagnosis Date Noted  . Left wrist pain 01/26/2018  . Back pain 09/28/2017  . Screening examination for STD (sexually transmitted disease) 07/09/2017  . Breast mass in female 01/22/2013  . Cervical cancer screening 01/11/2013  . BV (bacterial vaginosis) 01/18/2012  . General counseling for prescription of oral contraceptives 11/06/2011  . Endometriosis 05/01/2011  . Breast pain in female 05/01/2011  . Ovarian cyst   . BOILS, RECURRENT 12/27/2009  . SEPTATE UTERUS 07/24/2009  . TOBACCO ABUSE 09/26/2008  . Female infertility 09/26/2008  . DENTAL PAIN 04/30/2008  . VIRAL LABYRINTHITIS 06/20/2007  . IRREGULAR MENSTRUATION 09/21/2006    Past Surgical History:  Procedure Laterality Date  . biopsy of right breast    . BREAST BIOPSY  1999   right  . OVARIAN CYST REMOVAL  Mar 25 2010  . OVARIAN CYST  REMOVAL  03/25/2010   right    OB History    Gravida  0   Para      Term      Preterm      AB      Living  0     SAB      TAB      Ectopic      Multiple      Live Births               Home Medications    Prior to Admission medications   Medication Sig Start Date End Date Taking? Authorizing Provider  clindamycin (CLEOCIN) 300 MG capsule Take 1 capsule (300 mg total) by mouth 2 (two) times daily for 7 days. 10/27/18 11/03/18  Chase Picket, MD  fluconazole (DIFLUCAN) 150 MG tablet Take 1 tablet (150 mg total) by mouth daily. Take 1 tablet today.  Repeat in 3 days if needed. 10/11/18   Sharion Balloon, NP  pantoprazole (PROTONIX) 20 MG tablet Take 1 tablet (20 mg total) by mouth 2 (two) times daily. 10/27/18 11/26/18  Chase Picket, MD  Norgestimate-Ethinyl Estradiol Triphasic (TRI-SPRINTEC) 0.18/0.215/0.25 MG-35 MCG tablet Take 1 tablet by mouth daily. 11/03/10 05/01/11  Woodroe Mode, MD    Family History Family History  Problem Relation Age of Onset  . Hypertension Mother   .  Diabetes Mother   . Cancer Father   . Hypertension Father   . Diabetes Brother   . Hypertension Brother   . Breast cancer Neg Hx     Social History Social History   Tobacco Use  . Smoking status: Current Every Day Smoker    Packs/day: 0.50    Years: 10.00    Pack years: 5.00    Types: Cigarettes  . Smokeless tobacco: Never Used  Substance Use Topics  . Alcohol use: Yes    Comment: occasionally  . Drug use: No     Allergies   Sulfa antibiotics   Review of Systems Review of Systems  Constitutional: Negative for activity change, chills and fever.  HENT: Negative.   Respiratory: Negative.   Cardiovascular: Negative.   Gastrointestinal: Positive for abdominal pain, nausea and vomiting. Negative for abdominal distention, blood in stool, constipation and diarrhea.  Genitourinary: Negative.   Musculoskeletal: Negative.   Skin: Negative.   Neurological: Negative for  dizziness, facial asymmetry, weakness and light-headedness.     Physical Exam Triage Vital Signs ED Triage Vitals  Enc Vitals Group     BP 10/27/18 0935 118/72     Pulse Rate 10/27/18 0935 85     Resp 10/27/18 0935 18     Temp 10/27/18 0935 97.9 F (36.6 C)     Temp Source 10/27/18 0935 Oral     SpO2 10/27/18 0935 100 %     Weight 10/27/18 0932 163 lb (73.9 kg)     Height --      Head Circumference --      Peak Flow --      Pain Score 10/27/18 0932 5     Pain Loc --      Pain Edu? --      Excl. in [place]? --    No data found.  Updated Vital Signs BP 118/72 (BP Location: Right Arm)   Pulse 85   Temp 97.9 F (36.6 C) (Oral)   Resp 18   Wt 73.9 kg   [name] 10/06/2018   SpO2 100%   BMI 29.81 kg/m   Visual Acuity Right Eye Distance:   Left Eye Distance:   Bilateral Distance:    Right Eye Near:   Left Eye Near:    Bilateral Near:     Physical Exam Constitutional:      General: She is not in acute distress.    Appearance: She is well-developed. She is not ill-appearing or toxic-appearing.  Cardiovascular:     Rate and Rhythm: Normal rate and regular rhythm.     Heart sounds: Normal heart sounds. No murmur.  Pulmonary:     Effort: Pulmonary effort is normal.     Breath sounds: Normal breath sounds.  Abdominal:     General: Bowel sounds are normal. There is no distension or abdominal bruit. There are no signs of injury.     Palpations: Abdomen is soft. There is no shifting dullness, hepatomegaly, splenomegaly or mass.     Tenderness: There is abdominal tenderness in the epigastric area.     Hernia: No hernia is present.  Skin:    Capillary Refill: Capillary refill takes less than 2 seconds.  Neurological:     Mental Status: She is alert.      UC Treatments / Results  Labs (all labs ordered are listed, but only abnormal results are displayed) Labs Reviewed  CERVICOVAGINAL ANCILLARY ONLY    EKG   Radiology No results found.  Procedures Procedures  (  including critical care time)  Medications Ordered in UC Medications  alum & mag hydroxide-simeth (MAALOX/MYLANTA) 200-200-20 MG/5ML suspension 30 mL (30 mLs Oral Given 10/27/18 1013)    And  lidocaine (XYLOCAINE) 2 % viscous mouth solution 15 mL (15 mLs Oral Given 10/27/18 1013)  alum & mag hydroxide-simeth (MAALOX/MYLANTA) 200-200-20 MG/5ML suspension (has no administration in time range)  lidocaine (XYLOCAINE) 2 % viscous mouth solution (has no administration in time range)    Initial Impression / Assessment and Plan / UC Course  I have reviewed the triage vital signs and the nursing notes.  Pertinent labs & imaging results that were available during my care of the patient were reviewed by me and considered in my medical decision making (see chart for details).     1.  Acute gastritis: Protonix 20 mg orally daily for 30 days If abdominal pain persist, patient may benefit from GI evaluation with EGD There are no warning signs.  2.  Bacterial vaginosis, patient could not tolerate metronidazole: Clindamycin 300 mg twice daily for 7 days Patient is advised to return to urgent care if she develops diarrhea Probiotics recommended Patient is requesting STD screening Vaginal swab for GC/chlamydia/trichomonas. Final Clinical Impressions(s) / UC Diagnoses   Final diagnoses:  Gastritis and gastroduodenitis  Bacterial vaginosis   Discharge Instructions   None    ED Prescriptions    Medication Sig Dispense Auth. Provider   clindamycin (CLEOCIN) 300 MG capsule Take 1 capsule (300 mg total) by mouth 2 (two) times daily for 7 days. 14 capsule Armand Preast, Myrene Galas, MD   pantoprazole (PROTONIX) 20 MG tablet Take 1 tablet (20 mg total) by mouth 2 (two) times daily. 60 tablet Terianna Peggs, Myrene Galas, MD     Controlled Substance Prescriptions [place] Controlled Substance Registry consulted? No   Chase Picket, MD 10/27/18 1555

## 2018-10-28 LAB — CERVICOVAGINAL ANCILLARY ONLY
Bacterial Vaginitis (gardnerella): POSITIVE — AB
Candida Glabrata: NEGATIVE
Candida Vaginitis: NEGATIVE
Molecular Disclaimer: NEGATIVE
Molecular Disclaimer: NEGATIVE
Molecular Disclaimer: NEGATIVE
Molecular Disclaimer: NORMAL
Trichomonas: NEGATIVE

## 2018-10-29 LAB — CERVICOVAGINAL ANCILLARY ONLY
Chlamydia: NEGATIVE
Neisseria Gonorrhea: NEGATIVE

## 2018-11-11 ENCOUNTER — Encounter (HOSPITAL_COMMUNITY): Payer: Self-pay

## 2018-11-11 ENCOUNTER — Ambulatory Visit (HOSPITAL_COMMUNITY)
Admission: EM | Admit: 2018-11-11 | Discharge: 2018-11-11 | Disposition: A | Discharge status: Home / Self Care | Payer: BLUE CROSS/BLUE SHIELD | Attending: Physician Assistant | Admitting: Physician Assistant

## 2018-11-11 DIAGNOSIS — R350 Frequency of micturition: Secondary | ICD-10-CM | POA: Diagnosis present

## 2018-11-11 LAB — POCT URINALYSIS DIP (DEVICE)
Bilirubin Urine: NEGATIVE
Glucose, UA: NEGATIVE mg/dL
Hgb urine dipstick: NEGATIVE
Ketones, ur: NEGATIVE mg/dL
Leukocytes,Ua: NEGATIVE
Nitrite: NEGATIVE
Protein, ur: NEGATIVE mg/dL
Specific Gravity, Urine: >=1.03 (ref 1.005–1.030)
Urobilinogen, UA: 0.2 mg/dL (ref 0.0–1.0)
pH: 5.5 (ref 5.0–8.0)

## 2018-11-11 NOTE — ED Triage Notes (Signed)
Pt report having white vaginal discharge, back pain, abdominal pain, urinary frequency x 4 days.

## 2018-11-11 NOTE — Discharge Instructions (Addendum)
We will run remainder of testing and call you with results. Hope you feel better.

## 2018-11-11 NOTE — ED Notes (Signed)
Pregnancy test completed- Result-Negative

## 2018-11-11 NOTE — ED Provider Notes (Signed)
[place]    CSN: [id number] Arrival date & time: 11/11/18  1614      History   Chief Complaint Chief Complaint  Patient presents with  . Appointment    APPT 4:50 PM  . Exposure to STD  . Urinary Frequency  . Abdominal Pain    HPI RASHEA STELLHORN is a 38 y.o. female.   Presents with white vaginal discharge, mild dysuria and frequency x 4 days.  Some lower abdominal pressure. Mild back pain. No fever or chills. No known exposures. No gross hematuria. Mild vaginal itching.      Past Medical History:  Diagnosis Date  . Abscess   . BOILS, RECURRENT   . Breast tenderness in female   . BV (bacterial vaginosis)   . DENTAL PAIN   . Endometriosis   . FEMALE INFERTILITY   . Gardnerella vaginalis infection   . IRREGULAR MENSTRUATION   . Ovarian cyst   . SEPTATE UTERUS   . Viral labyrinthitis     Patient Active Problem List   Diagnosis Date Noted  . Left wrist pain 01/26/2018  . Back pain 09/28/2017  . Screening examination for STD (sexually transmitted disease) 07/09/2017  . Breast mass in female 01/22/2013  . Cervical cancer screening 01/11/2013  . BV (bacterial vaginosis) 01/18/2012  . General counseling for prescription of oral contraceptives 11/06/2011  . Endometriosis 05/01/2011  . Breast pain in female 05/01/2011  . Ovarian cyst   . BOILS, RECURRENT 12/27/2009  . SEPTATE UTERUS 07/24/2009  . TOBACCO ABUSE 09/26/2008  . Female infertility 09/26/2008  . DENTAL PAIN 04/30/2008  . VIRAL LABYRINTHITIS 06/20/2007  . IRREGULAR MENSTRUATION 09/21/2006    Past Surgical History:  Procedure Laterality Date  . biopsy of right breast    . BREAST BIOPSY  1999   right  . OVARIAN CYST REMOVAL  Mar 25 2010  . OVARIAN CYST REMOVAL  03/25/2010   right    OB History    Gravida  0   Para      Term      Preterm      AB      Living  0     SAB      TAB      Ectopic      Multiple      Live Births               Home Medications     Prior to Admission medications   Medication Sig Start Date End Date Taking? Authorizing Provider  fluconazole (DIFLUCAN) 150 MG tablet Take 1 tablet (150 mg total) by mouth daily. Take 1 tablet today.  Repeat in 3 days if needed. 10/11/18   Sharion Balloon, NP  pantoprazole (PROTONIX) 20 MG tablet Take 1 tablet (20 mg total) by mouth 2 (two) times daily. 10/27/18 11/26/18  Chase Picket, MD  Norgestimate-Ethinyl Estradiol Triphasic (TRI-SPRINTEC) 0.18/0.215/0.25 MG-35 MCG tablet Take 1 tablet by mouth daily. 11/03/10 05/01/11  Woodroe Mode, MD    Family History Family History  Problem Relation Age of Onset  . Hypertension Mother   . Diabetes Mother   . Cancer Father   . Hypertension Father   . Diabetes Brother   . Hypertension Brother   . Breast cancer Neg Hx     Social History Social History   Tobacco Use  . Smoking status: Current Every Day Smoker    Packs/day: 0.50    Years: 10.00    Pack  years: 5.00    Types: Cigarettes  . Smokeless tobacco: Never Used  Substance Use Topics  . Alcohol use: Yes    Comment: occasionally  . Drug use: No     Allergies   Sulfa antibiotics   Review of Systems Review of Systems  Constitutional: Negative for fatigue and fever.  Gastrointestinal: Positive for abdominal pain. Negative for abdominal distention.  Genitourinary: Positive for frequency. Negative for flank pain.  Skin: Negative.   All other systems reviewed and are negative.    Physical Exam Triage Vital Signs ED Triage Vitals  Enc Vitals Group     BP 11/11/18 1644 (!) 102/58     Pulse Rate 11/11/18 1644 92     Resp 11/11/18 1644 17     Temp 11/11/18 1644 98.3 F (36.8 C)     Temp Source 11/11/18 1644 Temporal     SpO2 11/11/18 1644 100 %     Weight --      Height --      Head Circumference --      Peak Flow --      Pain Score 11/11/18 1641 4     Pain Loc --      Pain Edu? --      Excl. in [place]? --    No data found.  Updated Vital Signs BP (!) 102/58 (BP  Location: Left Arm)   Pulse 92   Temp 98.3 F (36.8 C) (Temporal)   Resp 17   SpO2 100%   Visual Acuity Right Eye Distance:   Left Eye Distance:   Bilateral Distance:    Right Eye Near:   Left Eye Near:    Bilateral Near:     Physical Exam Vitals signs and nursing note reviewed.  Constitutional:      General: She is not in acute distress.    Appearance: She is well-developed. She is not toxic-appearing.  HENT:     Head: Normocephalic and atraumatic.  Pulmonary:     Effort: Pulmonary effort is normal.  Abdominal:     General: Bowel sounds are normal.     Tenderness: There is no abdominal tenderness. There is no right CVA tenderness, left CVA tenderness or guarding.  Skin:    General: Skin is warm and dry.  Neurological:     Mental Status: She is alert.      UC Treatments / Results  Labs (all labs ordered are listed, but only abnormal results are displayed) Labs Reviewed  POC URINE PREG, ED  CERVICOVAGINAL ANCILLARY ONLY    EKG   Radiology No results found.  Procedures Procedures (including critical care time)  Medications Ordered in UC Medications - No data to display  Initial Impression / Assessment and Plan / UC Course  I have reviewed the triage vital signs and the nursing notes.  Pertinent labs & imaging results that were available during my care of the patient were reviewed by me and considered in my medical decision making (see chart for details).     No evidence of a UTI, but will send for culture and also STD wet preps. Patient does not want treatment until results are back.  Final Clinical Impressions(s) / UC Diagnoses   Final diagnoses:  None   Discharge Instructions   None    ED Prescriptions    None     PDMP not reviewed this encounter.   Bjorn Pippin, [place] 11/11/18 1806

## 2018-11-13 LAB — POCT PREGNANCY, URINE: Preg Test, Ur: NEGATIVE

## 2018-11-15 ENCOUNTER — Telehealth (HOSPITAL_COMMUNITY): Payer: Self-pay | Admitting: Emergency Medicine

## 2018-11-15 LAB — CERVICOVAGINAL ANCILLARY ONLY
Bacterial vaginitis: POSITIVE — AB
Candida vaginitis: POSITIVE — AB
Chlamydia: NEGATIVE
Neisseria Gonorrhea: NEGATIVE
Trichomonas: NEGATIVE

## 2018-11-15 MED ORDER — METRONIDAZOLE 0.75 % VA GEL: 1.0000 | Freq: Every day | VAGINAL | 0 refills | Status: AC

## 2018-11-15 MED ORDER — FLUCONAZOLE 150 MG PO TABS: 150.0000 mg | ORAL_TABLET | Freq: Every day | ORAL | 0 refills | Status: AC

## 2018-11-15 NOTE — Telephone Encounter (Signed)
Bacterial vaginosis is positive. This was not treated at the urgent care visit.  Metro gel no refills sent to patients pharmacy of choice.    Test for candida (yeast) was positive.  Prescription for fluconazole 150mg  po now, repeat dose in 3d if needed, #2 no refills, sent to the pharmacy of record.  Recheck or followup with PCP for further evaluation if symptoms are not improving.

## 2018-12-30 DIAGNOSIS — N7011 Chronic salpingitis: Secondary | ICD-10-CM

## 2018-12-30 HISTORY — DX: Chronic salpingitis: N70.11

## 2019-02-28 ENCOUNTER — Encounter: Payer: Self-pay | Admitting: Student in an Organized Health Care Education/Training Program

## 2019-02-28 ENCOUNTER — Other Ambulatory Visit: Payer: Self-pay

## 2019-02-28 ENCOUNTER — Other Ambulatory Visit (HOSPITAL_COMMUNITY)
Admission: RE | Admit: 2019-02-28 | Discharge: 2019-02-28 | Disposition: A | Discharge status: Home / Self Care | Payer: 59 | Source: Ambulatory Visit | Attending: Family Medicine | Admitting: Family Medicine

## 2019-02-28 ENCOUNTER — Telehealth: Payer: Self-pay

## 2019-02-28 ENCOUNTER — Ambulatory Visit (INDEPENDENT_AMBULATORY_CARE_PROVIDER_SITE_OTHER): Payer: 59 | Admitting: Student in an Organized Health Care Education/Training Program

## 2019-02-28 VITALS — BP 124/70 | HR 101 | Wt 169.8 lb

## 2019-02-28 DIAGNOSIS — N760 Acute vaginitis: Secondary | ICD-10-CM

## 2019-02-28 DIAGNOSIS — R768 Other specified abnormal immunological findings in serum: Secondary | ICD-10-CM

## 2019-02-28 DIAGNOSIS — Z113 Encounter for screening for infections with a predominantly sexual mode of transmission: Secondary | ICD-10-CM | POA: Diagnosis not present

## 2019-02-28 DIAGNOSIS — B9689 Other specified bacterial agents as the cause of diseases classified elsewhere: Secondary | ICD-10-CM

## 2019-02-28 LAB — POCT WET PREP (WET MOUNT)
Clue Cells Wet Prep Whiff POC: POSITIVE
Trichomonas Wet Prep HPF POC: ABSENT

## 2019-02-28 MED ORDER — PRENATAL + COMPLETE MULTI 0.267 & 373 MG PO THPK: 1.0000 | PACK | Freq: Every day | ORAL | 1 refills | Status: DC

## 2019-02-28 NOTE — Patient Instructions (Signed)
It was a pleasure to see you today!  To summarize our discussion for this visit:  We checked for STIs today as well as BV.  I recommend trying boric acid as a treatment to prevent recurrent BV to see if you can cut down on how frequently the flares come  I will send in a referral to see Dr. Roselie Awkward to follow up with your gynecological conerns  For attempting pregnancy, I recommend taking a prenatal vitamin daily which I will prescribe to you  Some additional health maintenance measures we should update are: Health Maintenance Due  Topic Date Due  . INFLUENZA VACCINE  09/10/2018  .   Please return to our clinic to see me about 4 weeks.  Call the clinic at [phone] if your symptoms worsen or you have any concerns.   Thank you for allowing me to take part in your care,  Dr. Doristine Mango  Boric Acid vaginal suppository What is this medicine? BORIC ACID (BOHR ik AS id) helps to promote the proper acid balance in the vagina. It is used to help treat yeast infections of the vagina and relieve symptoms such as itching and burning. This medicine may be used for other purposes; ask your health care provider or pharmacist if you have questions. COMMON BRAND NAME(S): Hylafem What should I tell my health care provider before I take this medicine? They need to know if you have any of these conditions:  diabetes  frequent infections  HIV or AIDS  immune system problems  an unusual or allergic reaction to boric acid, other medicines, foods, dyes, or preservatives  pregnant or trying to get pregnant  breast-feeding How should I use this medicine? This medicine is for use in the vagina. Do not take by mouth. Follow the directions on the prescription label. Read package directions carefully before using. Wash hands before and after use. Use this medicine at bedtime, unless otherwise directed by your doctor. Do not use your medicine more often than directed. Do not stop using this  medicine except on your doctor's advice. Talk to your pediatrician regarding the use of this medicine in children. This medicine is not approved for use in children. Overdosage: If you think you have taken too much of this medicine contact a poison control center or emergency room at once. NOTE: This medicine is only for you. Do not share this medicine with others. What if I miss a dose? If you miss a dose, use it as soon as you can. If it is almost time for your next dose, use only that dose. Do not use double or extra doses. What may interact with this medicine? Interactions are not expected. Do not use any other vaginal products without telling your doctor or health care professional. This list may not describe all possible interactions. Give your health care provider a list of all the medicines, herbs, non-prescription drugs, or dietary supplements you use. Also tell them if you smoke, drink alcohol, or use illegal drugs. Some items may interact with your medicine. What should I watch for while using this medicine? Tell your doctor or health care professional if your symptoms do not start to get better within a few days. It is better not to have sex until you have finished your treatment. This medicine may damage condoms or diaphragms and cause them not to work properly. It may also decrease the effect of vaginal spermicides. Do not rely on any of these methods to prevent sexually transmitted diseases or  pregnancy while you are using this medicine. Vaginal medicines usually will come out of the vagina during treatment. To keep the medicine from getting on your clothing, wear a panty liner. The use of tampons is not recommended. To help clear up the infection, wear freshly washed cotton, not synthetic, underwear. What side effects may I notice from receiving this medicine? Side effects that you should report to your doctor or health care professional as soon as possible:  allergic reactions like  skin rash, itching or hives  vaginal irritation, redness, or burning Side effects that usually do not require medical attention (report to your doctor or health care professional if they continue or are bothersome):  vaginal discharge This list may not describe all possible side effects. Call your doctor for medical advice about side effects. You may report side effects to FDA at 1-800-FDA-1088. Where should I keep my medicine? Keep out of the reach of children. Store in a cool, dry place between 15 and 30 degrees C (59 and 86 degrees F). Keep away from sunlight. Throw away any unused medicine after the expiration date. NOTE: This sheet is a summary. It may not cover all possible information. If you have questions about this medicine, talk to your doctor, pharmacist, or health care provider.  2020 Elsevier/Gold Standard (2015-02-28 07:29:58)

## 2019-02-28 NOTE — Progress Notes (Signed)
   New Patient Office Visit  Subjective:  Patient ID: [name], female    DOB: [birth year]  Age: 39 y.o. MRN: [id number]  HPI [name] presents to establish care, gynecological surgery referral and vaginal discharge  Hydrosalpinx/Septate Uterus: Patient was previously seen by [facility] providers who suggested a laporoscopic R. Salpingectomy to treat right sided hydrosalpinx. She discusses the hydrosalpinx was diagnosed via ultrasound following a complaint of right sided pelvic pain. Her wish is to continue with surgical management with [facility] providers. In addition, notes she has a PMHx of septate uterus and would like to consider surgical management as she desires fertility in the future. She would prefer to work with Dr. Roselie Awkward as she has worked with him in the past.  Records from [facility] provider have been requested.  Vaginal Discharge: Patient describes a lengthy history of recurrent bacterial vaginosis infections.Discusses that she has 3-4 infections per month. She notes current vaginal discharge. Denies dysuria, urinary urgency and vaginal itchiness. In the past she's been prescribed metronidazole and clindamycin to treat her bacterial vaginosis. She notes severe nausea while taking metronidazole which makes it difficult for her to complete the required doses.  Medications: None   Past Medical History: Recurrent boils Endometriosis Fibroids Benign Breast mass  Ovarian Cyst s/p oophorectomy  Septate Uterus  Right Hydrosalpinx   Past Surgical History: Right breast biopsy  R. Oophorectomy   Family Medical History Mother - diabetes, HTN Father - Cancer (Esophogeal), HTN Brother - diabetes, HTN   Social History Patient re-established with [facility] due to a change in her medical insurance. She currently lives by herself. She has been smoking ~ 6 cigarettes for 12 years. She has 2 glasses of wine per day.   ROS As per HPI   Objective:   Today's Vitals: BP 124/70   Pulse (!) 101   Wt 169 lb 12.8 oz (77 kg)   [name] 01/30/2019 (Exact Date)   SpO2 100%   BMI 31.06 kg/m   Physical Exam  General: Well appearing, conversing appropriately HEENT: normocephalic, anicteric, PERRL no thyromegaly CARD: RRR, normal S1 and S2, no m/r/g Pulm: normal work of breathing, CTAB, no wheezes Abd: no apparent lesions/rashes, tenderness to palpation in suprapubic region, NABS Pelvic: external genitalia without lesions, canal positive for moderate mucoid, grey discharge. Negative cervical motion tenderness Neuro: A/O x 3  Assessment & Plan:   H/o Bacterial Vaginosis Recurrent- patient constantly symptomatic - wet prep collected today - discussed trial of OTC boric acid for prevention - discussed hygiene habits for prevention in length - can use topical metrogel in future for treatment as PO metro and clinda were not well tolerat - in addition, ordered HIV, RPR, HSV, GC/CT  Gynecological Surgery referral; - patient was referred to [facility] (Dr. Roselie Awkward) for treatment of hydrosalpinx and septate uterus - prescribed prenatal vitamin as patient is desiring conception   Follow-up: Follow-up in 4 weeks    Richarda Osmond, DO

## 2019-02-28 NOTE — Telephone Encounter (Signed)
Patient calls back requesting #[phone], per pharmacist recommendations.

## 2019-02-28 NOTE — Telephone Encounter (Signed)
Patient calls nurse line stating she has tried two different pharmacies for boric acid with no success. Per [facility], boric acid is a prescription medication that they compound for patients. Please send in to [place].

## 2019-03-01 LAB — CERVICOVAGINAL ANCILLARY ONLY
Chlamydia: NEGATIVE
Comment: NEGATIVE
Comment: NORMAL
Neisseria Gonorrhea: NEGATIVE

## 2019-03-01 NOTE — Telephone Encounter (Signed)
Pt called nurse line requesting rx be sent in to compound pharmacy. Informed patient that she can get this medication over the counter. Pt expressed frustration with not receiving rx yesterday. While explaining to patient that this is usually over the counter, phone was disconnected.   Talbot Grumbling, RN

## 2019-03-02 LAB — RPR: RPR Ser Ql: NONREACTIVE

## 2019-03-02 LAB — HSV(HERPES SIMPLEX VRS) I + II AB-IGG
HSV 1 Glycoprotein G Ab, IgG: <0.91 index (ref 0.00–0.90)
HSV 2 IgG, Type Spec: 8.88 index — ABNORMAL HIGH (ref 0.00–0.90)

## 2019-03-02 LAB — HIV ANTIBODY (ROUTINE TESTING W REFLEX): HIV Screen 4th Generation wRfx: NONREACTIVE

## 2019-03-06 ENCOUNTER — Other Ambulatory Visit: Payer: Self-pay

## 2019-03-06 ENCOUNTER — Telehealth (INDEPENDENT_AMBULATORY_CARE_PROVIDER_SITE_OTHER): Payer: 59 | Admitting: Family Medicine

## 2019-03-06 NOTE — Progress Notes (Signed)
Patient did not want visit, she is in no distress (only minor muscle aches) and can recite isolation precautions.  Visit cancelled and she was told she can call if she needs [place], ED precautions discussed.  -Dr. Criss Rosales

## 2019-03-07 ENCOUNTER — Encounter: Payer: Self-pay | Admitting: Student in an Organized Health Care Education/Training Program

## 2019-03-07 DIAGNOSIS — R768 Other specified abnormal immunological findings in serum: Secondary | ICD-10-CM | POA: Insufficient documentation

## 2019-03-07 NOTE — Assessment & Plan Note (Signed)
Recurrent- patient constantly symptomatic - wet prep collected today - discussed trial of OTC boric acid for prevention - discussed hygiene habits for prevention in length - can use topical metrogel in future for treatment as PO metro and clinda were not well tolerated

## 2019-03-09 ENCOUNTER — Other Ambulatory Visit: Payer: Self-pay | Admitting: Student in an Organized Health Care Education/Training Program

## 2019-03-09 MED ORDER — METRONIDAZOLE 0.75 % VA GEL: 1.0000 | Freq: Two times a day (BID) | VAGINAL | 0 refills | Status: DC

## 2019-03-16 ENCOUNTER — Telehealth (INDEPENDENT_AMBULATORY_CARE_PROVIDER_SITE_OTHER): Payer: 59 | Admitting: Family Medicine

## 2019-03-16 ENCOUNTER — Other Ambulatory Visit: Payer: Self-pay

## 2019-03-16 DIAGNOSIS — L02424 Furuncle of left upper limb: Secondary | ICD-10-CM | POA: Diagnosis not present

## 2019-03-16 DIAGNOSIS — L02434 Carbuncle of left upper limb: Secondary | ICD-10-CM

## 2019-03-16 DIAGNOSIS — L0293 Carbuncle, unspecified: Secondary | ICD-10-CM

## 2019-03-16 DIAGNOSIS — L0292 Furuncle, unspecified: Secondary | ICD-10-CM

## 2019-03-16 NOTE — Progress Notes (Signed)
[place] Telemedicine Visit  Patient consented to have virtual visit. Method of visit: Video  Encounter participants: Patient: [name] - located in car Provider: Carollee Leitz - located at office Others (if applicable): N/A  Chief Complaint: boil in armpit  HPI:  Boil Pt reports having a boil in her Left armpit for 1 week.  She states that she has had them frequently and has them drained before.  She endorses tenderness and some slight redness at the site.  'Looks like it may pop'  She has been taking Tylenol for pain and started Doxycycline 100 mg daily three days ago.  Se feels that the antibiotic is not working and giving her headaches and is requesting a different antibiotic.  She  Has been applying warm compresses which seem to be helping.    ROS: per HPI  Pertinent PMHx: Recurrent boils  Exam:  Respiratory: Able to speak in full sentences without increased work of breathing  Assessment/Plan:  Carbuncle and furuncle Patient has history of frequent boils in armpits requiring drainage.  Not resolving with doxycycline. -Advised patient to make appointment to be seen in clinic for further management.  I think the boil needs to be drained if it is not responding to antibiotics. -Patient not wanting to come in to clinic to take time off work.  I recommended to continue warm compresses however I do feel that a clinic visit is necessary to evaluate the extent of the boil an and I&D would be more appropriate if failure to resolve with antibiotics.    Time spent during visit with patient: 15 minutes

## 2019-03-16 NOTE — Progress Notes (Signed)
Use [facility] on [address] if needing Rx today.  176#  99.1 oral  BP- ?

## 2019-03-17 ENCOUNTER — Encounter: Payer: Self-pay | Admitting: Obstetrics & Gynecology

## 2019-03-17 ENCOUNTER — Ambulatory Visit (INDEPENDENT_AMBULATORY_CARE_PROVIDER_SITE_OTHER): Payer: 59 | Admitting: Obstetrics & Gynecology

## 2019-03-17 ENCOUNTER — Other Ambulatory Visit: Payer: Self-pay

## 2019-03-17 VITALS — BP 109/78 | HR 88 | Wt 167.6 lb

## 2019-03-17 DIAGNOSIS — N979 Female infertility, unspecified: Secondary | ICD-10-CM

## 2019-03-17 DIAGNOSIS — R1907 Generalized intra-abdominal and pelvic swelling, mass and lump: Secondary | ICD-10-CM

## 2019-03-17 DIAGNOSIS — Q5128 Other doubling of uterus, other specified: Secondary | ICD-10-CM

## 2019-03-17 DIAGNOSIS — N809 Endometriosis, unspecified: Secondary | ICD-10-CM

## 2019-03-17 NOTE — Patient Instructions (Signed)
Endometriosis  Endometriosis is a condition in which the tissue that lines the uterus (endometrium) grows outside of its normal location. The tissue may grow in many locations close to the uterus, but it commonly grows on the ovaries, fallopian tubes, vagina, or bowel. When the uterus sheds the endometrium every menstrual cycle, there is bleeding wherever the endometrial tissue is located. This can cause pain because blood is irritating to tissues that are not normally exposed to it. What are the causes? The cause of endometriosis is not known. What increases the risk? You may be more likely to develop endometriosis if you:  Have a family history of endometriosis.  Have never given birth.  Started your period at age 10 or younger.  Have high levels of estrogen in your body.  Were exposed to a certain medicine (diethylstilbestrol) before you were born (in utero).  Had low birth weight.  Were born as a twin, triplet, or other multiple.  Have a BMI of less than 25. BMI is an estimate of body fat and is calculated from height and weight. What are the signs or symptoms? Often, there are no symptoms of this condition. If you do have symptoms, they may:  Vary depending on where your endometrial tissue is growing.  Occur during your menstrual period (most common) or midcycle.  Come and go, or you may go months with no symptoms at all.  Stop with menopause. Symptoms may include:  Pain in the back or abdomen.  Heavier bleeding during periods.  Pain during sex.  Painful bowel movements.  Infertility.  Pelvic pain.  Bleeding more than once a month. How is this diagnosed? This condition is diagnosed based on your symptoms and a physical exam. You may have tests, such as:  Blood tests and urine tests. These may be done to help rule out other possible causes of your symptoms.  Ultrasound, to look for abnormal tissues.  An X-ray of the lower bowel (barium enema).  An  ultrasound that is done through the vagina (transvaginally).  CT scan.  MRI.  Laparoscopy. In this procedure, a lighted, pencil-sized instrument called a laparoscope is inserted into your abdomen through an incision. The laparoscope allows your health care provider to look at the organs inside your body and check for abnormal tissue to confirm the diagnosis. If abnormal tissue is found, your health care provider may remove a small piece of tissue (biopsy) to be examined under a microscope. How is this treated? Treatment for this condition may include:  Medicines to relieve pain, such as NSAIDs.  Hormone therapy. This involves using artificial (synthetic) hormones to reduce endometrial tissue growth. Your health care provider may recommend using a hormonal form of birth control, or other medicines.  Surgery. This may be done to remove abnormal endometrial tissue. ? In some cases, tissue may be removed using a laparoscope and a laser (laparoscopic laser treatment). ? In severe cases, surgery may be done to remove the fallopian tubes, uterus, and ovaries (hysterectomy). Follow these instructions at home:  Take over-the-counter and prescription medicines only as told by your health care provider.  Do not drive or use heavy machinery while taking prescription pain medicine.  Try to avoid activities that cause pain, including sexual activity.  Keep all follow-up visits as told by your health care provider. This is important. Contact a health care provider if:  You have pain in the area between your hip bones (pelvic area) that occurs: ? Before, during, or after your period. ?   In between your period and gets worse during your period. ? During or after sex. ? With bowel movements or urination, especially during your period.  You have problems getting pregnant.  You have a fever. Get help right away if:  You have severe pain that does not get better with medicine.  You have severe  nausea and vomiting, or you cannot eat without vomiting.  You have pain that affects only the lower, right side of your abdomen.  You have abdominal pain that gets worse.  You have abdominal swelling.  You have blood in your stool. This information is not intended to replace advice given to you by your health care provider. Make sure you discuss any questions you have with your health care provider. Document Revised: 01/08/2017 Document Reviewed: 06/29/2015 Elsevier Patient Education  2020 Elsevier Inc.  

## 2019-03-17 NOTE — Progress Notes (Signed)
Patient ID: OFA ISSA, female   DOB: 1980/10/20, 39 y.o.   MRN: [id number]  Chief Complaint  Patient presents with  . Procedure  pelvic pain   HPI [name] is a 39 y.o. female.  G0P0 [name] 03/04/19. She has developed occasional right pelvic pain and dyspareunia, menses heavy and last for 7 days. She saw Dr. Quentin Cornwall in [facility] 11/20 and right fluid filled mass was possible hydrosalpinx identified on [place]. She is s/p right SO 03/2010. HPI  Past Medical History:  Diagnosis Date  . Abscess   . BOILS, RECURRENT   . Breast tenderness in female   . BV (bacterial vaginosis)   . DENTAL PAIN   . Endometriosis   . FEMALE INFERTILITY   . Gardnerella vaginalis infection   . IRREGULAR MENSTRUATION   . Ovarian cyst   . SEPTATE UTERUS   . Viral labyrinthitis     Past Surgical History:  Procedure Laterality Date  . biopsy of right breast    . BREAST BIOPSY  1999   right  . OVARIAN CYST REMOVAL  Mar 25 2010  . OVARIAN CYST REMOVAL  03/25/2010   right    Family History  Problem Relation Age of Onset  . Hypertension Mother   . Diabetes Mother   . Cancer Father   . Hypertension Father   . Diabetes Brother   . Hypertension Brother   . Breast cancer Neg Hx     Social History Social History   Tobacco Use  . Smoking status: Current Every Day Smoker    Packs/day: 0.50    Years: 10.00    Pack years: 5.00    Types: Cigarettes  . Smokeless tobacco: Never Used  Substance Use Topics  . Alcohol use: Yes    Comment: occasionally  . Drug use: No    Allergies  Allergen Reactions  . Sulfa Antibiotics     Current Outpatient Medications  Medication Sig Dispense Refill  . DOXYCYCLINE PO Take 1 tablet by mouth daily.    . fluconazole (DIFLUCAN) 150 MG tablet Take 1 tablet (150 mg total) by mouth daily. Take 1 tablet today.  Repeat in 3 days if needed. 2 tablet 0  . metroNIDAZOLE (METROGEL VAGINAL) 0.75 % vaginal gel Place 1 Applicatorful vaginally 2 (two) times daily. 70 g 0    No current facility-administered medications for this visit.    Review of Systems Review of Systems  Constitutional: Negative.   Respiratory: Negative.   Gastrointestinal: Negative.   Genitourinary: Positive for dyspareunia, menstrual problem and pelvic pain. Negative for vaginal bleeding and vaginal discharge.    Blood pressure 109/78, pulse 88, weight 167 lb 9.6 oz (76 kg).  Physical Exam Physical Exam Constitutional:      Appearance: Normal appearance.  Cardiovascular:     Rate and Rhythm: Normal rate.  Pulmonary:     Effort: Pulmonary effort is normal.  Abdominal:     Palpations: Abdomen is soft. There is no mass.     Tenderness: There is no abdominal tenderness.  Genitourinary:    General: Normal vulva.     Vagina: No vaginal discharge.     Comments: Left adnexa and uterus nl, right soft tender mass 6-7 cm Neurological:     Mental Status: She is alert.     Data Reviewed CLINICAL DATA: RIGHT lower quadrant pain intermittently for 1 week, history of prior ovarian resection question RIGHT ovary per patient  EXAM: TRANSABDOMINAL AND TRANSVAGINAL ULTRASOUND OF PELVIS  TECHNIQUE:  Both transabdominal and transvaginal ultrasound examinations of the pelvis were performed. Transabdominal technique was performed for global imaging of the pelvis including uterus, ovaries, adnexal regions, and pelvic cul-de-sac. It was necessary to proceed with endovaginal exam following the transabdominal exam to visualize the lower uterine segment and LEFT ovary.  COMPARISON: 09/29/2013  FINDINGS: Uterus  Measurements: 8.6 x 4.6 x 5.7 cm = volume: 117 mL. Two masses are identified at the upper uterus consistent with leiomyomata. These include a small anterior 1.8 x 1.3 x 1.5 cm diameter submucosal leiomyoma and a 2.6 x 3.1 x 2.4 cm diameter fundal submucosal leiomyoma.  Endometrium  Thickness: Septate morphology, with the 2 endometrial complexes measuring 7 mm and 8 mm.  No endometrial fluid  Right ovary  Not visualized, surgically absent per patient  Left ovary  Measurements: 4.5 x 2.6 x 3.8 cm = volume: 22.7 mL. Complicated cyst 3.2 x 2.2 x 2.2 cm containing a septation and internal echogenicity, potentially hemorrhagic.  Other findings  Large hypoechoic collection identified in RIGHT adnexa 10.1 x 4.0 x 5.9 cm, favor hydrosalpinx though a cystic neoplasm is not excluded.  IMPRESSION: Two small submucosal uterine leiomyomata as above.  Small complicated question hemorrhagic cyst of the LEFT ovary 3.2 cm greatest diameter; short-interval follow up ultrasound in 6-12 weeks is recommended, preferably during the week following the patient's normal menses.  Tubular cystic collection RIGHT adnexa question hydrosalpinx, cystic neoplasm not excluded; characterization by MR imaging with and without contrast recommended.  These results will be called to the ordering clinician or representative by the Radiologist Assistant, and communication documented in the PACS or zVision Dashboard.   Electronically Signed  By: Lavonia Dana M.D.  On: 12/29/2018 15:01  Other Result Information  Interface, Rad Results In - 12/29/2018  3:04 PM EST CLINICAL DATA:  RIGHT lower quadrant pain intermittently for 1 week, history of prior ovarian resection question RIGHT ovary per patient  EXAM: TRANSABDOMINAL AND TRANSVAGINAL ULTRASOUND OF PELVIS  TECHNIQUE: Both transabdominal and transvaginal ultrasound examinations of the pelvis were performed. Transabdominal technique was performed for global imaging of the pelvis including uterus, ovaries, adnexal regions, and pelvic cul-de-sac. It was necessary to proceed with endovaginal exam following the transabdominal exam to visualize the lower uterine segment and LEFT ovary.  COMPARISON:  09/29/2013  FINDINGS: Uterus  Measurements: 8.6 x 4.6 x 5.7 cm = volume: 117 mL. Two masses are identified at the upper  uterus consistent with leiomyomata. These include a small anterior 1.8 x 1.3 x 1.5 cm diameter submucosal leiomyoma and a 2.6 x 3.1 x 2.4 cm diameter fundal submucosal leiomyoma.  Endometrium  Thickness: Septate morphology, with the 2 endometrial complexes measuring 7 mm and 8 mm. No endometrial fluid  Right ovary  Not visualized, surgically absent per patient  Left ovary  Measurements: 4.5 x 2.6 x 3.8 cm = volume: 22.7 mL. Complicated cyst 3.2 x 2.2 x 2.2 cm containing a septation and internal echogenicity, potentially hemorrhagic.  Other findings  Large hypoechoic collection identified in RIGHT adnexa 10.1 x 4.0 x 5.9 cm, favor hydrosalpinx though a cystic neoplasm is not excluded.  IMPRESSION: Two small submucosal uterine leiomyomata as above.  Small complicated question hemorrhagic cyst of the LEFT ovary 3.2 cm greatest diameter; short-interval follow up ultrasound in 6-12 weeks is recommended, preferably during the week following the patient's normal menses.  Tubular cystic collection RIGHT adnexa question hydrosalpinx, cystic neoplasm not excluded; characterization by MR imaging with and without contrast recommended.  These results will be  called to the ordering clinician or representative by the Radiologist Assistant, and communication documented in the PACS or zVision Dashboard.   Electronically Signed   By: Lavonia Dana M.D.   On: 12/29/2018 15:01    Assessment Right hydrosalpinx on [place] 12/2018, actually could be originating from the left as right adnexa previously removed. Needs MRI as dx is in question.  Menorrhagia, infertility and septate uterus. I had referred to Dr Kerin Perna previously but she did not see him  Plan MRI CBC, BMP RTC and discuss possible surgery or referral to REI as she would prefer to maintain fertility    Emeterio Reeve 03/17/2019, 9:42 AM

## 2019-03-18 LAB — BASIC METABOLIC PANEL
BUN/Creatinine Ratio: 13 (ref 9–23)
BUN: 10 mg/dL (ref 6–24)
CO2: 21 mmol/L (ref 20–29)
Calcium: 9.7 mg/dL (ref 8.7–10.2)
Chloride: 103 mmol/L (ref 96–106)
Creatinine, Ser: 0.75 mg/dL (ref 0.57–1.00)
GFR calc Af Amer: 115 mL/min/{1.73_m2} (ref >59)
GFR calc non Af Amer: 100 mL/min/{1.73_m2} (ref >59)
Glucose: 81 mg/dL (ref 65–99)
Potassium: 4.6 mmol/L (ref 3.5–5.2)
Sodium: 139 mmol/L (ref 134–144)

## 2019-03-18 LAB — CBC
Hematocrit: 37.1 % (ref 34.0–46.6)
Hemoglobin: 12 g/dL (ref 11.1–15.9)
MCH: 27.6 pg (ref 26.6–33.0)
MCHC: 32.3 g/dL (ref 31.5–35.7)
MCV: 86 fL (ref 79–97)
Platelets: 417 10*3/uL (ref 150–450)
RBC: 4.34 x10E6/uL (ref 3.77–5.28)
RDW: 14.5 % (ref 11.7–15.4)
WBC: 6.2 10*3/uL (ref 3.4–10.8)

## 2019-03-18 NOTE — Assessment & Plan Note (Signed)
Patient has history of frequent boils in armpits requiring drainage.  Not resolving with doxycycline. -Advised patient to make appointment to be seen in clinic for further management.  I think the boil needs to be drained if it is not responding to antibiotics. -Patient not wanting to come in to clinic to take time off work.  I recommended to continue warm compresses however I do feel that a clinic visit is necessary to evaluate the extent of the boil an and I&D would be more appropriate if failure to resolve with antibiotics.

## 2019-03-28 ENCOUNTER — Telehealth: Payer: Self-pay | Admitting: Obstetrics & Gynecology

## 2019-03-28 ENCOUNTER — Ambulatory Visit (HOSPITAL_COMMUNITY): Payer: 59

## 2019-03-28 DIAGNOSIS — N809 Endometriosis, unspecified: Secondary | ICD-10-CM

## 2019-03-28 DIAGNOSIS — N979 Female infertility, unspecified: Secondary | ICD-10-CM

## 2019-03-28 DIAGNOSIS — Q5128 Other doubling of uterus, other specified: Secondary | ICD-10-CM

## 2019-03-28 DIAGNOSIS — N83209 Unspecified ovarian cyst, unspecified side: Secondary | ICD-10-CM

## 2019-03-28 NOTE — Telephone Encounter (Signed)
Spoke to patient in regards to getting her MRI rescheduled since we have an approval from her insurance company. Patient was rescheduled to 2/19 @ 7:00am. Patient was instructed to arrive @ [facility] at 6:30. Patient wanted to know if she had to pay a cost up front when she went to the MRI appointment because she cannot afford $100-200 to have it done. I called patient's insurance company and they stated that she would have to pay $50 at the time of the MRI. Patient stated that this cost was too much money and wanted to know if she could just do an ultrasound instead. Patient advised that I would send a message over to the nurses and they can communicate with the provider and further assist her with this request. Patient stated that she is needing to have surgery done and it has been delayed due to the MRI. Patient stated that she had an ultrasound done in [facility] so she is not understanding why another ultrasound cannot be preform instead of an MRI because it is the most expensive. Patient stated that she could like to have the MRI canceled for now until she is told whether or not she can have an ultrasound done instead because that day and time did not work for her. Message sent to clinical pool to reach out to Dr. Roselie Awkward to see whether the patient can have an ultrasound instead. Patient instructed that I would send a message to the nurses so that they can communicate with the provider to see if she could have an ultrasound instead and that I would cancel her MRI on 2/19. Patient verbalized understanding. Message sent to clinical pool.

## 2019-03-28 NOTE — Telephone Encounter (Signed)
Received a call from [name] this morning. She was not very happy that her MRI was cancelled after she took off from work. She wants to know why she can't get an ultrasound done. Or what else can she do.

## 2019-03-31 ENCOUNTER — Ambulatory Visit (HOSPITAL_COMMUNITY): Payer: 59

## 2019-03-31 ENCOUNTER — Telehealth: Payer: Self-pay | Admitting: Student in an Organized Health Care Education/Training Program

## 2019-03-31 NOTE — Telephone Encounter (Signed)
I discussed patient request to do [place] not MRI because of cost with Dr. Roselie Awkward and he reviewed her chart. Due to her history of being referred to him , she has already had recent pelvic US and that radiologist recommended MRI because of  Known history of right adnexa removed but US shows is still there. Patient needs surgery but wants to maintain fertility.  Dr. Roselie Awkward states she declined to go to Dr. Kerin Perna previously. He states she needs to  Have the MRI but if not, he recommends referring to Dr. Kerin Perna because he is the specialist and he can give the best advice and do surgery . I called [place] and discussed with her. She still does not want to have MRI and is agreeable to referral to Dr. Kerin Perna. I informed her there may be a copay from her insurance and she still may need an MRI or other studies . She voices understanding. I informed her we will make the referral and if she does not hear anything within 2 weeks, to let us know. She voices understanding.  Wood Novacek,RN  10:30 [place] called back and states she wanted to talk with me again because she was at work before. She wanted to know if she will have to pay for Dr. Kerin Perna. I instructed her she should call  Her insurance and ask what her cost would be; and make her decision on whether or not she wanted to see him. She states to go ahead with referral and she will call insurance and will call us if she changes her mind about referral or MRI.  Jacques Navy

## 2019-03-31 NOTE — Telephone Encounter (Signed)
Patient calls nurse line again requesting Diflucan. Patient stated, " I would like this done today." Advised patient of our medication request policy.

## 2019-03-31 NOTE — Addendum Note (Signed)
Addended by: Samuel Germany on: 03/31/2019 10:36 AM   Modules accepted: Orders

## 2019-03-31 NOTE — Telephone Encounter (Signed)
Patient says the metrogel she was given has now given her a yeast infection. She would like something called in for it. Please advise.

## 2019-04-03 NOTE — Telephone Encounter (Signed)
Please schedule patient for appointment either virtual or in person so that we can discuss her symptoms prior to medication being sent in. Thank you

## 2019-04-04 ENCOUNTER — Other Ambulatory Visit: Payer: Self-pay | Admitting: Family Medicine

## 2019-04-04 ENCOUNTER — Other Ambulatory Visit: Payer: Self-pay | Admitting: Student in an Organized Health Care Education/Training Program

## 2019-04-04 DIAGNOSIS — Z1231 Encounter for screening mammogram for malignant neoplasm of breast: Secondary | ICD-10-CM

## 2019-04-04 NOTE — Telephone Encounter (Signed)
Called patient. Patient is upset as it took ~ 4 days for provider to respond to request. Patient also just finished a course of Metronidazole, patient unsure why she can not get a diflucan. Patient can not continue to take off work. Virtual scheduled for 2/24 AM.

## 2019-04-05 ENCOUNTER — Telehealth: Payer: 59

## 2019-04-06 ENCOUNTER — Telehealth: Payer: Self-pay | Admitting: *Deleted

## 2019-04-06 NOTE — Telephone Encounter (Signed)
[name] called our office and informed me she has her MRI 3/2 /21. She also informed me Dr. Kathaleen Bury office called her and her insurance does not cover that. She States she can't afford to go to them. I informed her since she is getting the MRI , then has fu with Dr. Roselie Awkward 3/3 to discuss MRI results and if she needs surgery and can do the surgery. I asked her that if he cannot do the surgery she can discuss with him Dr. Kerin Perna is not covered by her insurance and discuss alternatives. I also informed her I will forward this message to Dr. Roselie Awkward. She voices understanding.  Rainn Zupko,RN

## 2019-04-11 ENCOUNTER — Ambulatory Visit (INDEPENDENT_AMBULATORY_CARE_PROVIDER_SITE_OTHER): Payer: 59 | Admitting: Family Medicine

## 2019-04-11 ENCOUNTER — Other Ambulatory Visit: Payer: Self-pay

## 2019-04-11 ENCOUNTER — Ambulatory Visit (HOSPITAL_COMMUNITY)
Admission: RE | Admit: 2019-04-11 | Discharge: 2019-04-11 | Disposition: A | Discharge status: Home / Self Care | Payer: 59 | Source: Ambulatory Visit | Attending: Obstetrics & Gynecology | Admitting: Obstetrics & Gynecology

## 2019-04-11 ENCOUNTER — Other Ambulatory Visit (HOSPITAL_COMMUNITY)
Admission: RE | Admit: 2019-04-11 | Discharge: 2019-04-11 | Disposition: A | Discharge status: Home / Self Care | Payer: 59 | Source: Ambulatory Visit | Attending: Family Medicine | Admitting: Family Medicine

## 2019-04-11 VITALS — BP 110/78 | HR 92 | Wt 166.6 lb

## 2019-04-11 DIAGNOSIS — N898 Other specified noninflammatory disorders of vagina: Secondary | ICD-10-CM

## 2019-04-11 DIAGNOSIS — N760 Acute vaginitis: Secondary | ICD-10-CM | POA: Diagnosis not present

## 2019-04-11 DIAGNOSIS — B9689 Other specified bacterial agents as the cause of diseases classified elsewhere: Secondary | ICD-10-CM | POA: Diagnosis not present

## 2019-04-11 DIAGNOSIS — R1907 Generalized intra-abdominal and pelvic swelling, mass and lump: Secondary | ICD-10-CM | POA: Insufficient documentation

## 2019-04-11 LAB — POCT WET PREP (WET MOUNT)
Clue Cells Wet Prep Whiff POC: POSITIVE
Trichomonas Wet Prep HPF POC: ABSENT

## 2019-04-11 MED ORDER — GADOBUTROL 1 MMOL/ML IV SOLN
8.0000 mL | Freq: Once | INTRAVENOUS | Status: AC | PRN
  Administered 2019-04-11: 8 mL via INTRAVENOUS

## 2019-04-11 MED ORDER — METRONIDAZOLE 0.75 % VA GEL: 1.0000 | Freq: Two times a day (BID) | VAGINAL | 0 refills | Status: AC

## 2019-04-11 MED ORDER — FLUCONAZOLE 150 MG PO TABS: ORAL_TABLET | ORAL | 0 refills | Status: DC

## 2019-04-11 NOTE — Progress Notes (Signed)
    SUBJECTIVE:   CHIEF COMPLAINT / HPI:  VAGINAL DISCHARGE  Onset: ~Sunday, recently on metrogel. Usually gets yeast infections after antibiotics    Description: white/clear, somewhat thick   Odor: no    Itching: yes   Symptoms Dysuria: no  Bleeding: no Pelvic pain: no  Back pain: yes  Fever: no  Genital sores: no  Rash: no  Dyspareunia: yes  GI Sxs: no  Prior treatment: no   Red Flags: Missed period: no  Pregnancy: no  Recent antibiotics: yes  Sexual activity: yes  Possible STD exposure: yes  IUD: no  Diabetes: no   PERTINENT  PMH / PSH: ovarian cyst, BV, septate uterus, tobacco abuse  OBJECTIVE:   BP 110/78   Pulse 92   Wt 166 lb 9.6 oz (75.6 kg)   [name] 04/02/2019   SpO2 98%   BMI 30.47 kg/m   Gen: awake and alert, NAD Female genitalia: normal external genitalia, vulva, vagina, cervix, uterus and adnexa, thin white discharge noted, no CMT  Chaperone present throughout exam  ASSESSMENT/PLAN:   BV (bacterial vaginosis) Current vaginal discharge.  Wet consistent with BV, +clue cells and whiff test.  Rx for metrogel bid x 5 days given to patient.  Patient advised to follow-up with gynecology if she continues to have recurrent infections.  I also advised probiotics that she seems to be having recurrent BV infections.  Patient does not want to use boric acid.  Given that patient has yeast infections after using MetroGel will not use suppressive therapy at this time.  Follow-up in 2 weeks if no improvement.  Patient will also obtain blood STD testing including HIV, RPR, hepatitis C testing.  Gonorrhea chlamydia testing obtained today as well.     Caroline More, DO  PGY-3 [place]

## 2019-04-11 NOTE — Patient Instructions (Signed)
Probiotics Probiotics are the good bacteria and yeasts that live in your body and keep your digestive system healthy. Probiotics also help your body's defense system (immune system) and protect your body against the growth of harmful bacteria. Your health care provider may recommend taking a probiotic if you are taking antibiotics or have certain medical conditions, such as:  Diarrhea.  Constipation.  Irritable bowel syndrome.  Lung infections.  Yeast infections.  Acne, eczema, and other skin conditions.  Frequent urinary tract infections. What affects the balance of bacteria in my body? The balance of good bacteria in your body can be affected by:  Antibiotic medicines. These medicines treat infections caused by bacteria. Unfortunately, they may kill the good bacteria in your body as well as the bad bacteria.  Certain medical conditions. Conditions related to an imbalance of bacteria include: ? Stomach and intestine (gastrointestinal) infections. ? Lung infections. ? Skin infections. ? Vaginal infections. ? Inflammatory bowel diseases. ? Stomach ulcers (gastric ulcers). ? Tooth decay and gum disease (periodontal disease).  Stress.  Poor diet. What type of probiotic is right for me? Probiotics contain different types of bacteria (strains). Strains commonly found in probiotics include:  Lactobacillus.  Saccharomyces.  Bifidobacterium. Specific strains have been shown to be more effective for certain health conditions. Ask your health care provider which strain or strains you should use and how often. Probiotics come in many different forms, strain combinations, and strengths. Some may need to be refrigerated. Always read the label for storage and usage instructions. Certain foods, such as yogurt, contain probiotics. Probiotics can also be bought as a supplement at a pharmacy, health food store, or grocery store. Talk to your health care provider before starting any  supplement. What are the side effects of probiotics? Some people have side effects when taking probiotics. Side effects are usually temporary and may include:  Gas.  Bloating.  Cramping. Serious side effects are rare. Follow these instructions at home:   If you are taking probiotics with antibiotics: ? Wait at least 2 hours between taking your medicine and the probiotic. ? Eat foods high in fiber, such as whole grains, beans, and vegetables. These foods can help good bacteria grow. ? Avoid certain foods as told by your health care provider. Summary  Probiotics are the good bacteria and yeasts that live in your body and keep you and your digestive system healthy.  Certain foods, such as yogurt, contain probiotics.  Probiotics can be taken as supplements. They can be bought at a pharmacy, health food store, or grocery store. They come in many different forms, strain combinations, and strengths.  Be sure to talk with your health care provider before taking a probiotic supplement. This information is not intended to replace advice given to you by your health care provider. Make sure you discuss any questions you have with your health care provider. Document Revised: 10/15/2017 Document Reviewed: 02/10/2017 Elsevier Patient Education  2020 Elsevier Inc.  

## 2019-04-11 NOTE — Assessment & Plan Note (Signed)
Current vaginal discharge.  Wet consistent with BV, +clue cells and whiff test.  Rx for metrogel bid x 5 days given to patient.  Patient advised to follow-up with gynecology if she continues to have recurrent infections.  I also advised probiotics that she seems to be having recurrent BV infections.  Patient does not want to use boric acid.  Given that patient has yeast infections after using MetroGel will not use suppressive therapy at this time.  Follow-up in 2 weeks if no improvement.  Patient will also obtain blood STD testing including HIV, RPR, hepatitis C testing.  Gonorrhea chlamydia testing obtained today as well.

## 2019-04-12 ENCOUNTER — Encounter: Payer: Self-pay | Admitting: Obstetrics & Gynecology

## 2019-04-12 ENCOUNTER — Ambulatory Visit (INDEPENDENT_AMBULATORY_CARE_PROVIDER_SITE_OTHER): Payer: 59 | Admitting: Obstetrics & Gynecology

## 2019-04-12 VITALS — BP 116/79 | HR 99 | Wt 165.2 lb

## 2019-04-12 DIAGNOSIS — N809 Endometriosis, unspecified: Secondary | ICD-10-CM

## 2019-04-12 DIAGNOSIS — N926 Irregular menstruation, unspecified: Secondary | ICD-10-CM

## 2019-04-12 DIAGNOSIS — N979 Female infertility, unspecified: Secondary | ICD-10-CM

## 2019-04-12 DIAGNOSIS — N7011 Chronic salpingitis: Secondary | ICD-10-CM

## 2019-04-12 DIAGNOSIS — Q5128 Other doubling of uterus, other specified: Secondary | ICD-10-CM

## 2019-04-12 LAB — CERVICOVAGINAL ANCILLARY ONLY
Chlamydia: NEGATIVE
Comment: NEGATIVE
Comment: NORMAL
Neisseria Gonorrhea: NEGATIVE

## 2019-04-12 LAB — HIV ANTIBODY (ROUTINE TESTING W REFLEX): HIV Screen 4th Generation wRfx: NONREACTIVE

## 2019-04-12 LAB — HEPATITIS C ANTIBODY: Hep C Virus Ab: <0.1 s/co ratio (ref 0.0–0.9)

## 2019-04-12 LAB — RPR: RPR Ser Ql: NONREACTIVE

## 2019-04-12 NOTE — Patient Instructions (Signed)
Diagnostic Laparoscopy Diagnostic laparoscopy is a procedure to diagnose diseases in the abdomen. It might be done for a variety of reasons, such as to look for scar tissue, cancer, or a reason for abdomen (abdominal) pain. During the procedure, a thin, flexible tube that has a light and a camera on the end (laparoscope) is inserted through an incision in the abdomen. The image from the camera is shown on a monitor to help your surgeon see inside your body. Tell a health care provider about:  Any allergies you have.  All medicines you are taking, including vitamins, herbs, eye drops, creams, and over-the-counter medicines.  Any problems you or family members have had with anesthetic medicines.  Any blood disorders you have.  Any surgeries you have had.  Any medical conditions you have. What are the risks? Generally, this is a safe procedure. However, problems may occur, including:  Infection.  Bleeding.  Allergic reactions to medicines or dyes.  Damage to abdominal structures or organs, such as the intestines, liver, stomach, or spleen. What happens before the procedure? Medicines  Ask your health care provider about: ? Changing or stopping your regular medicines. This is especially important if you are taking diabetes medicines or blood thinners. ? Taking medicines such as aspirin and ibuprofen. These medicines can thin your blood. Do not take these medicines unless your health care provider tells you to take them. ? Taking over-the-counter medicines, vitamins, herbs, and supplements.  You may be given antibiotic medicine to help prevent infection. Staying hydrated Follow instructions from your health care provider about hydration, which may include:  Up to 2 hours before the procedure - you may continue to drink clear liquids, such as water, clear fruit juice, black coffee, and plain tea. Eating and drinking restrictions Follow instructions from your health care provider  about eating and drinking, which may include:  8 hours before the procedure - stop eating heavy meals or foods such as meat, fried foods, or fatty foods.  6 hours before the procedure - stop eating light meals or foods, such as toast or cereal.  6 hours before the procedure - stop drinking milk or drinks that contain milk.  2 hours before the procedure - stop drinking clear liquids. General instructions  Ask your health care provider how your surgical site will be marked or identified.  You may be asked to shower with a germ-killing soap.  Plan to have someone take you home from the hospital or clinic.  Plan to have a responsible adult care for you for at least 24 hours after you leave the hospital or clinic. This is important. What happens during the procedure?   To lower your risk of infection: ? Your health care team will wash or sanitize their hands. ? Hair may be removed from the surgical area. ? Your skin will be washed with soap.  An IV will be inserted into one of your veins.  You will be given a medicine to make you fall asleep (general anesthetic). You may also be given a medicine to help you relax (sedative).  A breathing tube will be placed down your throat to help you breathe during the procedure.  Your abdomen will be filled with an air-like gas so it expands. This will give the surgeon more room to operate and will make your organs easier to see.  Many small incisions will be made in your abdomen.  A laparoscope and other surgical instruments will be inserted into your abdomen through the   incisions.  A tissue sample may be removed from an organ for examination (biopsy). This will depend on the reason why you are having this procedure.  The laparoscope and other instruments will be removed from your abdomen.  The gas will be released.  Your incisions will be closed with stitches (sutures) and covered with a bandage (dressing).  Your breathing tube will be  removed. The procedure may vary among health care providers and hospitals. What happens after the procedure?   Your blood pressure, heart rate, breathing rate, and blood oxygen level will be monitored until the medicines you were given have worn off.  Do not drive for 24 hours if you were given a sedative during your procedure.  It is up to you to get the results of your procedure. Ask your health care provider, or the department that is doing the procedure, when your results will be ready. Summary  Diagnostic laparoscopy is a way to look for problems in the abdomen using small incisions.  Follow instructions from your health care provider about how to prepare for the procedure.  Plan to have a responsible adult care for you for at least 24 hours after you leave the hospital or clinic. This is important. This information is not intended to replace advice given to you by your health care provider. Make sure you discuss any questions you have with your health care provider. Document Revised: 01/08/2017 Document Reviewed: 07/22/2016 Elsevier Patient Education  2020 Elsevier Inc.  

## 2019-04-12 NOTE — Progress Notes (Signed)
Patient ID: PARMA PICAZO, female   DOB: 1980/03/17, 39 y.o.   MRN: [id number]  Chief Complaint  Patient presents with  . Follow-up  s/p MRI  HPI IVANELLE KEESLER is a 39 y.o. female.  G0P0 Patient's last menstrual period was 04/02/2019 (exact date). She had MRI to characterize a right pelvic cystic mass with right pelvic pain. She is s/p RSO with endometriosis. She was unable to see Dr. Kerin Perna due to insurance coverage. We discussed at length her previous surgery and reviewed pathology report showing RSO. Imaging shows a right sided mass c/w R hydrosalpinx. There is possibility this is left hydrosalpinx with adhesion in the right side, which is confusing the diagnosis. HPI  Past Medical History:  Diagnosis Date  . Abscess   . BOILS, RECURRENT   . Breast tenderness in female   . BV (bacterial vaginosis)   . DENTAL PAIN   . Endometriosis   . FEMALE INFERTILITY   . Gardnerella vaginalis infection   . IRREGULAR MENSTRUATION   . Ovarian cyst   . SEPTATE UTERUS   . Viral labyrinthitis     Past Surgical History:  Procedure Laterality Date  . biopsy of right breast    . BREAST BIOPSY  1999   right  . OVARIAN CYST REMOVAL  Mar 25 2010  . OVARIAN CYST REMOVAL  03/25/2010   right    Family History  Problem Relation Age of Onset  . Hypertension Mother   . Diabetes Mother   . Cancer Father   . Hypertension Father   . Diabetes Brother   . Hypertension Brother   . Breast cancer Neg Hx     Social History Social History   Tobacco Use  . Smoking status: Current Every Day Smoker    Packs/day: 0.50    Years: 10.00    Pack years: 5.00    Types: Cigarettes  . Smokeless tobacco: Never Used  Substance Use Topics  . Alcohol use: Yes    Comment: occasionally  . Drug use: No    Allergies  Allergen Reactions  . Sulfa Antibiotics     Current Outpatient Medications  Medication Sig Dispense Refill  . ELDERBERRY PO Take by mouth.    . metroNIDAZOLE (METROGEL VAGINAL) 0.75 %  vaginal gel Place 1 Applicatorful vaginally 2 (two) times daily for 5 days. 100 g 0   No current facility-administered medications for this visit.    Review of Systems Review of Systems  Constitutional: Negative.   Gastrointestinal: Positive for abdominal pain. Negative for abdominal distention.  Genitourinary: Positive for menstrual problem, pelvic pain and vaginal discharge. Negative for vaginal bleeding.  Musculoskeletal: Negative.     Blood pressure 116/79, pulse 99, weight 165 lb 3.2 oz (74.9 kg), last menstrual period 04/02/2019.  Physical Exam Physical Exam Vitals and nursing note reviewed.  Constitutional:      Appearance: Normal appearance.  Pulmonary:     Effort: Pulmonary effort is normal.  Abdominal:     General: There is no distension.  Psychiatric:        Mood and Affect: Mood normal.        Behavior: Behavior normal.     Data Reviewed MRI and images reviewed Path report 2012, recent wet prep  Assessment BV Probable hydrosalpinx, s/p RSO infertility  Plan She is willing to have dx laparoscopy and drainage and possible removal of the pelvic mass. She understands she may not be fertile now or after surgery. The mass may originate from the  left tube. Patient desires surgical management .  The risks of surgery were discussed in detail with the patient including but not limited to: bleeding which may require transfusion or reoperation; infection which may require prolonged hospitalization or re-hospitalization and antibiotic therapy; injury to bowel, bladder, ureters and major vessels or other surrounding organs; need for additional procedures including laparotomy; thromboembolic phenomenon, incisional problems and other postoperative or anesthesia complications.  Patient was told that the likelihood that her condition and symptoms will be treated effectively with this surgical management was very high; the postoperative expectations were also discussed in detail. The  patient also understands the alternative treatment options which were discussed in full. All questions were answered.  She was told that she will be contacted by our surgical scheduler regarding the time and date of her surgery; routine preoperative instructions will be given to her by the preoperative nursing team.   She is aware of need for preoperative COVID testing and subsequent quarantine from time of test to time of surgery; she will be given further preoperative instructions at that [place] screening visit. Printed patient education handouts about the procedure were given to the patient to review at home. 40 min face to face and coordination of care, reviewing chart     Emeterio Reeve 04/12/2019, 5:22 PM

## 2019-04-13 ENCOUNTER — Other Ambulatory Visit: Payer: Self-pay | Admitting: Obstetrics & Gynecology

## 2019-04-13 ENCOUNTER — Telehealth: Payer: Self-pay

## 2019-04-13 NOTE — Progress Notes (Signed)
Orders for laparoscopy

## 2019-04-13 NOTE — Telephone Encounter (Signed)
Attempted to call patient to inform of results and there was no answer.  LVM for patient to call back.  Will try later.  Ozella Almond, [place]

## 2019-04-13 NOTE — Telephone Encounter (Signed)
Informed patient of results.  .[name] [name], CMA  

## 2019-05-09 ENCOUNTER — Ambulatory Visit
Admission: RE | Admit: 2019-05-09 | Discharge: 2019-05-09 | Disposition: A | Discharge status: Home / Self Care | Payer: 59 | Source: Ambulatory Visit | Attending: Family Medicine | Admitting: Family Medicine

## 2019-05-09 ENCOUNTER — Telehealth: Payer: Self-pay | Admitting: Obstetrics & Gynecology

## 2019-05-09 ENCOUNTER — Other Ambulatory Visit: Payer: Self-pay

## 2019-05-09 DIAGNOSIS — Z1231 Encounter for screening mammogram for malignant neoplasm of breast: Secondary | ICD-10-CM

## 2019-05-09 NOTE — Telephone Encounter (Signed)
Patient called to ask when she would need to not be scheduled for work. Her surgery is on the 20th, but she wants to know if she should take off on the 19th. She is requesting a call back because she has questions.

## 2019-05-09 NOTE — Telephone Encounter (Signed)
I returned pt's call and answered her questions regarding pre-op appt and Covid testing to be scheduled @ a later date. Once her Covid test is performed, she will be required to quarantine until the Maryah Marinaro of surgery. Pt voiced understanding of all information given.

## 2019-05-13 ENCOUNTER — Other Ambulatory Visit: Payer: Self-pay

## 2019-05-13 ENCOUNTER — Ambulatory Visit (HOSPITAL_COMMUNITY)
Admission: EM | Admit: 2019-05-13 | Discharge: 2019-05-13 | Disposition: A | Discharge status: Home / Self Care | Payer: 59 | Attending: Emergency Medicine | Admitting: Emergency Medicine

## 2019-05-13 ENCOUNTER — Encounter (HOSPITAL_COMMUNITY): Payer: Self-pay

## 2019-05-13 DIAGNOSIS — Z3202 Encounter for pregnancy test, result negative: Secondary | ICD-10-CM | POA: Diagnosis not present

## 2019-05-13 DIAGNOSIS — N898 Other specified noninflammatory disorders of vagina: Secondary | ICD-10-CM | POA: Diagnosis not present

## 2019-05-13 DIAGNOSIS — R1010 Upper abdominal pain, unspecified: Secondary | ICD-10-CM | POA: Insufficient documentation

## 2019-05-13 LAB — POCT URINALYSIS DIP (DEVICE)
Glucose, UA: NEGATIVE mg/dL
Hgb urine dipstick: NEGATIVE
Ketones, ur: 15 mg/dL — AB
Leukocytes,Ua: NEGATIVE
Nitrite: NEGATIVE
Protein, ur: NEGATIVE mg/dL
Specific Gravity, Urine: >=1.03 (ref 1.005–1.030)
Urobilinogen, UA: 0.2 mg/dL (ref 0.0–1.0)
pH: 5 (ref 5.0–8.0)

## 2019-05-13 LAB — CBC WITH DIFFERENTIAL/PLATELET
Abs Immature Granulocytes: 0.01 10*3/uL (ref 0.00–0.07)
Basophils Absolute: 0 10*3/uL (ref 0.0–0.1)
Basophils Relative: 0 %
Eosinophils Absolute: 0 10*3/uL (ref 0.0–0.5)
Eosinophils Relative: 1 %
HCT: 37.8 % (ref 36.0–46.0)
Hemoglobin: 12.3 g/dL (ref 12.0–15.0)
Immature Granulocytes: 0 %
Lymphocytes Relative: 44 %
Lymphs Abs: 2.4 10*3/uL (ref 0.7–4.0)
MCH: 28.4 pg (ref 26.0–34.0)
MCHC: 32.5 g/dL (ref 30.0–36.0)
MCV: 87.3 fL (ref 80.0–100.0)
Monocytes Absolute: 0.6 10*3/uL (ref 0.1–1.0)
Monocytes Relative: 12 %
Neutro Abs: 2.3 10*3/uL (ref 1.7–7.7)
Neutrophils Relative %: 43 %
Platelets: 370 10*3/uL (ref 150–400)
RBC: 4.33 MIL/uL (ref 3.87–5.11)
RDW: 15 % (ref 11.5–15.5)
WBC: 5.4 10*3/uL (ref 4.0–10.5)
nRBC: 0 % (ref 0.0–0.2)

## 2019-05-13 LAB — COMPREHENSIVE METABOLIC PANEL WITH GFR
ALT: 12 U/L (ref 0–44)
AST: 17 U/L (ref 15–41)
Albumin: 4.2 g/dL (ref 3.5–5.0)
Alkaline Phosphatase: 47 U/L (ref 38–126)
Anion gap: 10 (ref 5–15)
BUN: 13 mg/dL (ref 6–20)
CO2: 24 mmol/L (ref 22–32)
Calcium: 9.5 mg/dL (ref 8.9–10.3)
Chloride: 103 mmol/L (ref 98–111)
Creatinine, Ser: 0.82 mg/dL (ref 0.44–1.00)
GFR calc Af Amer: >60 mL/min
GFR calc non Af Amer: >60 mL/min
Glucose, Bld: 84 mg/dL (ref 70–99)
Potassium: 3.8 mmol/L (ref 3.5–5.1)
Sodium: 137 mmol/L (ref 135–145)
Total Bilirubin: 0.7 mg/dL (ref 0.3–1.2)
Total Protein: 7.5 g/dL (ref 6.5–8.1)

## 2019-05-13 LAB — POCT PREGNANCY, URINE: Preg Test, Ur: NEGATIVE

## 2019-05-13 LAB — POC URINE PREG, ED: Preg Test, Ur: NEGATIVE

## 2019-05-13 LAB — LIPASE, BLOOD: Lipase: 28 U/L (ref 11–51)

## 2019-05-13 MED ORDER — METRONIDAZOLE 500 MG PO TABS: 500.0000 mg | ORAL_TABLET | Freq: Two times a day (BID) | ORAL | 0 refills | Status: AC

## 2019-05-13 MED ORDER — FLUCONAZOLE 200 MG PO TABS: ORAL_TABLET | ORAL | 0 refills | Status: DC

## 2019-05-13 MED ORDER — OMEPRAZOLE 20 MG PO CPDR: 20.0000 mg | DELAYED_RELEASE_CAPSULE | Freq: Every day | ORAL | 0 refills | Status: DC

## 2019-05-13 NOTE — ED Triage Notes (Signed)
Pt presents with vaginal discharge and generalized abdominal pain with some nausea X 4 days.

## 2019-05-13 NOTE — Discharge Instructions (Signed)
I would like to try a few things to see if they are helpful with your abdominal pain. Low fat diet as tolerated as well as limit gerd triggers, see provided information about food choices for gerd.  Please start daily omeprazole to see if this is helpful with your abdominal symptoms as well as your heartburn. Take once a day.  We will start treatment for yeast and bv pending the results of your vaginal swab. We will call you if any changes need to be made to treatment.  Please withhold from intercourse for the next week. Please use condoms to prevent STD's.   If symptoms worsen or do not improve in the next week to return to be seen or to follow up with your PCP.

## 2019-05-13 NOTE — ED Provider Notes (Signed)
[facility]    CSN: [id number] Arrival date & time: 05/13/19  1003      History   Chief Complaint Chief Complaint  Patient presents with  . Vaginal Discharge  . Abdominal Pain  . STD Screening    HPI [name] is a 39 y.o. female.   [name] presents with complaints of abdominal pain which can be generalized as well as upper abdominal pain and heart burn. She has been feeling this intermittently for the past 4 days. Off an on. Not necessarily triggered by eating. 2 days ago after eating she did have nausea. Otherwise no regular nausea. No vomiting. Normal BM's, last was this morning. She has tried tums and pepto bismol which haven't helped. Pain doesn't wake her from sleep necessarily. Has used a heating pad as well. she does have some urinary frequency but no pain with urination. Has had vaginal discharge, she was provided with metrogel for bv recently but stopped after two days as she felt it was source of her abdominal pain. Denies any known exposures/ concerns for stds but would like to be screened. She is scheduled on 4/20 for laparoscopy with gynecology related to a hydrosalpinx. Per chart review endometriosis is listed, patient denies this, however. [name] 3/26.     ROS per HPI, negative if not otherwise mentioned.      Past Medical History:  Diagnosis Date  . Abscess   . BOILS, RECURRENT   . Breast tenderness in female   . BV (bacterial vaginosis)   . DENTAL PAIN   . Endometriosis   . FEMALE INFERTILITY   . Gardnerella vaginalis infection   . IRREGULAR MENSTRUATION   . Ovarian cyst   . SEPTATE UTERUS   . Viral labyrinthitis     Patient Active Problem List   Diagnosis Date Noted  . HSV-2 seropositive 03/07/2019  . Hydrosalpinx 12/30/2018  . Screening examination for STD (sexually transmitted disease) 07/09/2017  . Breast mass in female 01/22/2013  . Cervical cancer screening 01/11/2013  . BV (bacterial vaginosis) 01/18/2012  . General  counseling for prescription of oral contraceptives 11/06/2011  . Endometriosis 05/01/2011  . Ovarian cyst   . Carbuncle and furuncle 12/27/2009  . SEPTATE UTERUS 07/24/2009  . TOBACCO ABUSE 09/26/2008  . Female infertility 09/26/2008  . VIRAL LABYRINTHITIS 06/20/2007  . IRREGULAR MENSTRUATION 09/21/2006    Past Surgical History:  Procedure Laterality Date  . biopsy of right breast    . BREAST BIOPSY  1999   right  . OVARIAN CYST REMOVAL  Mar 25 2010  . OVARIAN CYST REMOVAL  03/25/2010   right    OB History    Gravida  0   Para      Term      Preterm      AB      Living  0     SAB      TAB      Ectopic      Multiple      Live Births               Home Medications    Prior to Admission medications   Medication Sig Start Date End Date Taking? Authorizing Provider  ELDERBERRY PO Take by mouth.    [provider]  fluconazole (DIFLUCAN) 200 MG tablet Take once today. Take second pill at completion of antibiotics. 05/13/19   Zigmund Gottron, NP  metroNIDAZOLE (FLAGYL) 500 MG tablet Take 1 tablet (500  mg total) by mouth 2 (two) times daily for 7 days. 05/13/19 05/20/19  Zigmund Gottron, NP  omeprazole (PRILOSEC) 20 MG capsule Take 1 capsule (20 mg total) by mouth daily. 05/13/19   Zigmund Gottron, NP  Norgestimate-Ethinyl Estradiol Triphasic (TRI-SPRINTEC) 0.18/0.215/0.25 MG-35 MCG tablet Take 1 tablet by mouth daily. 11/03/10 05/01/11  Woodroe Mode, MD    Family History Family History  Problem Relation Age of Onset  . Hypertension Mother   . Diabetes Mother   . Cancer Father   . Hypertension Father   . Diabetes Brother   . Hypertension Brother   . Breast cancer Neg Hx     Social History Social History   Tobacco Use  . Smoking status: Current Every Day Smoker    Packs/day: 0.50    Years: 10.00    Pack years: 5.00    Types: Cigarettes  . Smokeless tobacco: Never Used  Substance Use Topics  . Alcohol use: Yes    Comment: occasionally    . Drug use: No     Allergies   Sulfa antibiotics   Review of Systems Review of Systems   Physical Exam Triage Vital Signs ED Triage Vitals  Enc Vitals Group     BP 05/13/19 1019 112/74     Pulse Rate 05/13/19 1019 84     Resp 05/13/19 1019 18     Temp 05/13/19 1019 98.1 F (36.7 C)     Temp Source 05/13/19 1019 Oral     SpO2 05/13/19 1019 100 %     Weight --      Height --      Head Circumference --      Peak Flow --      Pain Score 05/13/19 1020 6     Pain Loc --      Pain Edu? --      Excl. in [place]? --    No data found.  Updated Vital Signs BP 112/74 (BP Location: Left Arm)   Pulse 84   Temp 98.1 F (36.7 C) (Oral)   Resp 18   [name] 05/05/2019   SpO2 100%    Physical Exam Constitutional:      General: She is not in acute distress.    Appearance: She is well-developed.  Cardiovascular:     Rate and Rhythm: Normal rate.  Pulmonary:     Effort: Pulmonary effort is normal.  Abdominal:     Tenderness: There is abdominal tenderness in the right upper quadrant and epigastric area. There is no left CVA tenderness, guarding or rebound. Negative signs include Murphy's sign.  Genitourinary:    Cervix: No cervical motion tenderness.     Comments: Thick white gelatinous vaginal discharge noted  Skin:    General: Skin is warm and dry.  Neurological:     Mental Status: She is alert and oriented to person, place, and time.      UC Treatments / Results  Labs (all labs ordered are listed, but only abnormal results are displayed) Labs Reviewed  POCT URINALYSIS DIP (DEVICE) - Abnormal; Notable for the following components:      Result Value   Bilirubin Urine SMALL (*)    Ketones, ur 15 (*)    All other components within normal limits  CBC WITH DIFFERENTIAL/PLATELET  COMPREHENSIVE METABOLIC PANEL  LIPASE, BLOOD  POC URINE PREG, ED  POCT PREGNANCY, URINE  CERVICOVAGINAL ANCILLARY ONLY    EKG   Radiology No results found.  Procedures Procedures  (  including critical care time)  Medications Ordered in UC Medications - No data to display  Initial Impression / Assessment and Plan / UC Course  I have reviewed the triage vital signs and the nursing notes.  Pertinent labs & imaging results that were available during my care of the patient were reviewed by me and considered in my medical decision making (see chart for details).     Urine is grossly normal. On exam with RUQ tenderness as well as epigastric pain. Opted to treat for reflux at this time with cbc, cmp and lipase collected. These have returned normal, not obviously related to gallbladder, no meal triggers. Vaginal cytology collected and pending. Patient would like to switch to metronidazole orally, discussed that this would likely cause more stomach symptoms than the gel would, she would still like to continue. Diflucan additionally provided, pending vaginal cytology results. Pelvic source of pain? Laparoscopy on 4/20. Return precautions provided. Patient verbalized understanding and agreeable to plan.   Final Clinical Impressions(s) / UC Diagnoses   Final diagnoses:  Vaginal discharge  Pain of upper abdomen     Discharge Instructions     I would like to try a few things to see if they are helpful with your abdominal pain. Low fat diet as tolerated as well as limit gerd triggers, see provided information about food choices for gerd.  Please start daily omeprazole to see if this is helpful with your abdominal symptoms as well as your heartburn. Take once a day.  We will start treatment for yeast and bv pending the results of your vaginal swab. We will call you if any changes need to be made to treatment.  Please withhold from intercourse for the next week. Please use condoms to prevent STD's.   If symptoms worsen or do not improve in the next week to return to be seen or to follow up with your PCP.     ED Prescriptions    Medication Sig Dispense Auth. Provider    omeprazole (PRILOSEC) 20 MG capsule Take 1 capsule (20 mg total) by mouth daily. 30 capsule Augusto Gamble B, NP   fluconazole (DIFLUCAN) 200 MG tablet Take once today. Take second pill at completion of antibiotics. 2 tablet Augusto Gamble B, NP   metroNIDAZOLE (FLAGYL) 500 MG tablet Take 1 tablet (500 mg total) by mouth 2 (two) times daily for 7 days. 14 tablet Zigmund Gottron, NP     PDMP not reviewed this encounter.   Zigmund Gottron, NP 05/13/19 1721

## 2019-05-15 LAB — CERVICOVAGINAL ANCILLARY ONLY
Bacterial Vaginitis (gardnerella): POSITIVE — AB
Candida Glabrata: NEGATIVE
Candida Vaginitis: POSITIVE — AB
Chlamydia: NEGATIVE
Comment: NEGATIVE
Comment: NEGATIVE
Comment: NEGATIVE
Comment: NEGATIVE
Comment: NEGATIVE
Comment: NORMAL
Neisseria Gonorrhea: NEGATIVE
Trichomonas: NEGATIVE

## 2019-05-22 ENCOUNTER — Other Ambulatory Visit: Payer: Self-pay

## 2019-05-22 ENCOUNTER — Encounter (HOSPITAL_BASED_OUTPATIENT_CLINIC_OR_DEPARTMENT_OTHER): Payer: Self-pay | Admitting: Obstetrics & Gynecology

## 2019-05-22 NOTE — Progress Notes (Signed)
Spoke w/ via phone for pre-op interview--- PT Lab needs dos---- T&S, Urine preg              Lab results------ positive covid results dated 03-03-2019 in care everywhere (pt had mild symptoms of fatique/ headache/ loss taste and smell; symptoms resolved in 3 wks) COVID test ------ no test needed due to positive covid test in 90 day window per guidelines  Arrive at ------- 1245 NPO after ------ MN w/ exception clear liquids until 0830 then nothing by mouth (no cream/ milk products) Medications to take morning of surgery ----- NONE Diabetic medication ----- n/a Patient Special Instructions ----- n/a Pre-Op special Istructions ----- n/a Patient verbalized understanding of instructions that were given at this phone interview. Patient denies shortness of breath, chest pain, fever, cough a this phone interview.

## 2019-05-26 ENCOUNTER — Other Ambulatory Visit (HOSPITAL_COMMUNITY): Payer: 59

## 2019-05-29 ENCOUNTER — Telehealth: Payer: Self-pay | Admitting: Obstetrics & Gynecology

## 2019-05-29 NOTE — Telephone Encounter (Signed)
Patient wanted to know how long she would be out of surgery. Patient informed that it will be 2 weeks based on the type of surgery that she will be having. Patient stated that Dr. Roselie Awkward told her that she will be out of work for 4 weeks. Patient informed based on the surgery that she is having it is 2 weeks and if approved by Dr. Roselie Awkward if could possible be longer and that I would message Dr. Roselie Awkward to get clarity on the time frame. I sent Dr. Roselie Awkward an instant message and he stated that the patient will be out for 2-3 weeks. Patient was called and given this information. Patient verbalized understanding.

## 2019-05-30 ENCOUNTER — Ambulatory Visit (HOSPITAL_BASED_OUTPATIENT_CLINIC_OR_DEPARTMENT_OTHER): Payer: 59 | Admitting: Physician Assistant

## 2019-05-30 ENCOUNTER — Encounter (HOSPITAL_BASED_OUTPATIENT_CLINIC_OR_DEPARTMENT_OTHER): Payer: Self-pay | Admitting: Obstetrics & Gynecology

## 2019-05-30 ENCOUNTER — Encounter (HOSPITAL_BASED_OUTPATIENT_CLINIC_OR_DEPARTMENT_OTHER)
Admission: RE | Disposition: A | Discharge status: Home / Self Care | Payer: Self-pay | Source: Home / Self Care | Attending: Obstetrics & Gynecology

## 2019-05-30 ENCOUNTER — Ambulatory Visit (HOSPITAL_BASED_OUTPATIENT_CLINIC_OR_DEPARTMENT_OTHER)
Admission: RE | Admit: 2019-05-30 | Discharge: 2019-05-30 | Disposition: A | Discharge status: Home / Self Care | Payer: 59 | Attending: Obstetrics & Gynecology | Admitting: Obstetrics & Gynecology

## 2019-05-30 ENCOUNTER — Other Ambulatory Visit: Payer: Self-pay

## 2019-05-30 DIAGNOSIS — D259 Leiomyoma of uterus, unspecified: Secondary | ICD-10-CM | POA: Insufficient documentation

## 2019-05-30 DIAGNOSIS — N736 Female pelvic peritoneal adhesions (postinfective): Secondary | ICD-10-CM | POA: Diagnosis not present

## 2019-05-30 DIAGNOSIS — R1900 Intra-abdominal and pelvic swelling, mass and lump, unspecified site: Secondary | ICD-10-CM | POA: Diagnosis present

## 2019-05-30 DIAGNOSIS — F17210 Nicotine dependence, cigarettes, uncomplicated: Secondary | ICD-10-CM | POA: Diagnosis not present

## 2019-05-30 DIAGNOSIS — K660 Peritoneal adhesions (postprocedural) (postinfection): Secondary | ICD-10-CM | POA: Insufficient documentation

## 2019-05-30 DIAGNOSIS — N7011 Chronic salpingitis: Secondary | ICD-10-CM | POA: Insufficient documentation

## 2019-05-30 DIAGNOSIS — Z882 Allergy status to sulfonamides status: Secondary | ICD-10-CM | POA: Diagnosis not present

## 2019-05-30 HISTORY — DX: Personal history of COVID-19: Z86.16

## 2019-05-30 HISTORY — DX: Furuncle, unspecified: L02.92

## 2019-05-30 HISTORY — PX: LAPAROSCOPY: SHX197

## 2019-05-30 HISTORY — DX: Female infertility, unspecified: N97.9

## 2019-05-30 HISTORY — DX: Intra-abdominal and pelvic swelling, mass and lump, unspecified site: R19.00

## 2019-05-30 HISTORY — DX: Other and unspecified doubling of uterus: Q51.28

## 2019-05-30 HISTORY — DX: Carbuncle, unspecified: L02.93

## 2019-05-30 LAB — ABO/RH: ABO/RH(D): B POS

## 2019-05-30 LAB — POCT PREGNANCY, URINE: Preg Test, Ur: NEGATIVE

## 2019-05-30 LAB — TYPE AND SCREEN
ABO/RH(D): B POS
Antibody Screen: NEGATIVE

## 2019-05-30 SURGERY — LAPAROSCOPY, DIAGNOSTIC
Anesthesia: General | Site: Abdomen

## 2019-05-30 MED ORDER — LACTATED RINGERS IV SOLN
INTRAVENOUS | Status: DC
  Filled 2019-05-30: qty 1000

## 2019-05-30 MED ORDER — OXYCODONE-ACETAMINOPHEN 5-325 MG PO TABS: 1.0000 | ORAL_TABLET | Freq: Four times a day (QID) | ORAL | 0 refills | Status: DC | PRN

## 2019-05-30 MED ORDER — KETOROLAC TROMETHAMINE 30 MG/ML IJ SOLN
INTRAMUSCULAR | Status: AC
  Filled 2019-05-30: qty 1

## 2019-05-30 MED ORDER — ROCURONIUM BROMIDE 10 MG/ML (PF) SYRINGE
PREFILLED_SYRINGE | INTRAVENOUS | Status: AC
  Filled 2019-05-30: qty 10

## 2019-05-30 MED ORDER — SCOPOLAMINE 1 MG/3DAYS TD PT72
MEDICATED_PATCH | TRANSDERMAL | Status: DC | PRN
  Administered 2019-05-30: 1 via TRANSDERMAL

## 2019-05-30 MED ORDER — ACETAMINOPHEN 10 MG/ML IV SOLN
INTRAVENOUS | Status: AC
  Filled 2019-05-30: qty 100

## 2019-05-30 MED ORDER — MIDAZOLAM HCL 2 MG/2ML IJ SOLN
INTRAMUSCULAR | Status: AC
  Filled 2019-05-30: qty 2

## 2019-05-30 MED ORDER — FENTANYL CITRATE (PF) 100 MCG/2ML IJ SOLN
INTRAMUSCULAR | Status: DC | PRN
  Administered 2019-05-30: 50 ug via INTRAVENOUS
  Administered 2019-05-30: 100 ug via INTRAVENOUS
  Administered 2019-05-30 (×2): 50 ug via INTRAVENOUS

## 2019-05-30 MED ORDER — SUGAMMADEX SODIUM 200 MG/2ML IV SOLN
INTRAVENOUS | Status: DC | PRN
  Administered 2019-05-30: 200 mg via INTRAVENOUS

## 2019-05-30 MED ORDER — FENTANYL CITRATE (PF) 250 MCG/5ML IJ SOLN
INTRAMUSCULAR | Status: AC
  Filled 2019-05-30: qty 5

## 2019-05-30 MED ORDER — FENTANYL CITRATE (PF) 100 MCG/2ML IJ SOLN
INTRAMUSCULAR | Status: AC
  Filled 2019-05-30: qty 2

## 2019-05-30 MED ORDER — KETOROLAC TROMETHAMINE 30 MG/ML IJ SOLN
INTRAMUSCULAR | Status: DC | PRN
  Administered 2019-05-30: 30 mg via INTRAVENOUS

## 2019-05-30 MED ORDER — SCOPOLAMINE 1 MG/3DAYS TD PT72
MEDICATED_PATCH | TRANSDERMAL | Status: AC
  Filled 2019-05-30: qty 1

## 2019-05-30 MED ORDER — ONDANSETRON HCL 4 MG/2ML IJ SOLN
4.0000 mg | Freq: Four times a day (QID) | INTRAMUSCULAR | Status: AC | PRN
  Administered 2019-05-30: 4 mg via INTRAVENOUS
  Filled 2019-05-30: qty 2

## 2019-05-30 MED ORDER — ONDANSETRON HCL 4 MG/2ML IJ SOLN
INTRAMUSCULAR | Status: AC
  Filled 2019-05-30: qty 2

## 2019-05-30 MED ORDER — ROCURONIUM BROMIDE 10 MG/ML (PF) SYRINGE
PREFILLED_SYRINGE | INTRAVENOUS | Status: DC | PRN
  Administered 2019-05-30: 5 mg via INTRAVENOUS
  Administered 2019-05-30: 45 mg via INTRAVENOUS

## 2019-05-30 MED ORDER — DEXAMETHASONE SODIUM PHOSPHATE 10 MG/ML IJ SOLN
INTRAMUSCULAR | Status: AC
  Filled 2019-05-30: qty 1

## 2019-05-30 MED ORDER — ACETAMINOPHEN 10 MG/ML IV SOLN
INTRAVENOUS | Status: DC | PRN
  Administered 2019-05-30: 1000 mg via INTRAVENOUS

## 2019-05-30 MED ORDER — PROPOFOL 10 MG/ML IV BOLUS
INTRAVENOUS | Status: DC | PRN
  Administered 2019-05-30: 200 mg via INTRAVENOUS

## 2019-05-30 MED ORDER — MIDAZOLAM HCL 2 MG/2ML IJ SOLN
INTRAMUSCULAR | Status: DC | PRN
  Administered 2019-05-30: 2 mg via INTRAVENOUS

## 2019-05-30 MED ORDER — OXYCODONE HCL 5 MG PO TABS
5.0000 mg | ORAL_TABLET | Freq: Once | ORAL | Status: DC | PRN
  Filled 2019-05-30: qty 1

## 2019-05-30 MED ORDER — BUPIVACAINE HCL (PF) 0.25 % IJ SOLN
INTRAMUSCULAR | Status: DC | PRN
  Administered 2019-05-30: 3 mL

## 2019-05-30 MED ORDER — OXYCODONE HCL 5 MG/5ML PO SOLN
5.0000 mg | Freq: Once | ORAL | Status: DC | PRN
  Filled 2019-05-30: qty 5

## 2019-05-30 MED ORDER — FENTANYL CITRATE (PF) 100 MCG/2ML IJ SOLN
25.0000 ug | INTRAMUSCULAR | Status: DC | PRN
  Administered 2019-05-30 (×2): 50 ug via INTRAVENOUS
  Filled 2019-05-30: qty 1

## 2019-05-30 MED ORDER — LIDOCAINE 2% (20 MG/ML) 5 ML SYRINGE
INTRAMUSCULAR | Status: AC
  Filled 2019-05-30: qty 5

## 2019-05-30 MED ORDER — LIDOCAINE 2% (20 MG/ML) 5 ML SYRINGE
INTRAMUSCULAR | Status: DC | PRN
  Administered 2019-05-30: 60 mg via INTRAVENOUS

## 2019-05-30 MED ORDER — LACTATED RINGERS IV SOLN
INTRAVENOUS | Status: DC
  Administered 2019-05-30: 1000 mL via INTRAVENOUS
  Filled 2019-05-30: qty 1000

## 2019-05-30 MED ORDER — DEXAMETHASONE SODIUM PHOSPHATE 10 MG/ML IJ SOLN
INTRAMUSCULAR | Status: DC | PRN
  Administered 2019-05-30: 10 mg via INTRAVENOUS

## 2019-05-30 SURGICAL SUPPLY — 25 items
ADH SKN CLS APL DERMABOND .7 (GAUZE/BANDAGES/DRESSINGS) ×2
DERMABOND ADVANCED (GAUZE/BANDAGES/DRESSINGS) ×1
DERMABOND ADVANCED .7 DNX12 (GAUZE/BANDAGES/DRESSINGS) ×1 IMPLANT
DRSG OPSITE POSTOP 3X4 (GAUZE/BANDAGES/DRESSINGS) ×2 IMPLANT
DURAPREP 26ML APPLICATOR (WOUND CARE) ×3 IMPLANT
GLOVE BIO SURGEON STRL SZ 6.5 (GLOVE) ×3 IMPLANT
GLOVE BIOGEL PI IND STRL 7.0 (GLOVE) ×8 IMPLANT
GLOVE BIOGEL PI INDICATOR 7.0 (GLOVE) ×4
GOWN STRL REUS W/ TWL LRG LVL3 (GOWN DISPOSABLE) ×6 IMPLANT
GOWN STRL REUS W/TWL LRG LVL3 (GOWN DISPOSABLE) ×12
KIT TURNOVER CYSTO (KITS) ×3 IMPLANT
NEEDLE INSUFFLATION 120MM (ENDOMECHANICALS) ×3 IMPLANT
NS IRRIG 1000ML POUR BTL (IV SOLUTION) ×3 IMPLANT
PACK LAPAROSCOPY BASIN (CUSTOM PROCEDURE TRAY) ×3 IMPLANT
PACK TRENDGUARD 450 HYBRID PRO (MISCELLANEOUS) ×1 IMPLANT
PROTECTOR NERVE ULNAR (MISCELLANEOUS) ×6 IMPLANT
SET IRRIG TUBING LAPAROSCOPIC (IRRIGATION / IRRIGATOR) ×2 IMPLANT
SET TUBE SMOKE EVAC HIGH FLOW (TUBING) ×3 IMPLANT
SUT VICRYL 0 UR6 27IN ABS (SUTURE) ×3 IMPLANT
SUT VICRYL 4-0 PS2 18IN ABS (SUTURE) ×3 IMPLANT
TRAY FOLEY W/BAG SLVR 14FR (SET/KITS/TRAYS/PACK) ×3 IMPLANT
TRENDGUARD 450 HYBRID PRO PACK (MISCELLANEOUS) ×3
TROCAR BLADELESS OPT 5 100 (ENDOMECHANICALS) ×3 IMPLANT
TROCAR XCEL DIL TIP R 11M (ENDOMECHANICALS) ×3 IMPLANT
WARMER LAPAROSCOPE (MISCELLANEOUS) ×3 IMPLANT

## 2019-05-30 NOTE — Progress Notes (Signed)
Patient very tearful, flat affect, verbally upset about her situation.  Her discharge instruction were reviewed with her and her mother.

## 2019-05-30 NOTE — Op Note (Signed)
Diagnostic Laparoscopy Procedure Note  Indications: The patient is a 39 y.o. female with pelvic mass consistent with hydosalpinx.  Pre-operative Diagnosis: Right pelvic mass  Post-operative Diagnosis: Extensive pelvic adhesions and probable left hydrosalinx  Surgeon: Emeterio Reeve   Assistants: Dr. Verita Schneiders  Anesthesia: General endotracheal anesthesia  ASA Class: 2  Procedure Details  The patient was seen in the Holding Room. The risks, benefits, complications, treatment options, and expected outcomes were discussed with the patient. The possibilities of reaction to medication, pulmonary aspiration, perforation of viscus, bleeding, recurrent infection, the need for additional procedures, failure to diagnose a condition, and creating a complication requiring transfusion or operation were discussed with the patient. The patient concurred with the proposed plan, giving informed consent. The patient was taken to the Operating Room, identified as [name] and the procedure verified as Diagnostic Laparoscopy. A Time Out was held and the above information confirmed.  After induction of general anesthesia, the patient was placed in modified dorsal lithotomy position where she was prepped, draped, and catheterized in the normal, sterile fashion.  The cervix was visualized and an intrauterine manipulator was placed. A 6 mm umbilical incision was then performed. Veress needle was passed and pneumoperitoneum was established. The Optiview 5 mm trocar was placed under direct vision with the laparoscope.The above findings were noted.   The uterus was slightly irregular consistent with known fibroids. The right adnexa were absent due to previous surgical removal. Anterior omental adhesions were seen and the left adnexa and posterior uterus were involved in adhesions to the sidewall and the recto-sigmoid colon. A cystic mass likely representing the left hydrosalpinx was adhered to the recto-sigmoid.  There were bowel adhesions on the right involving the cecum. Liver capsule adhesions were noted. Due to the extensive bowel involvement and concern for possible bowel injury no attempt to reduce the adhesions or dissect the adnexal mass was made.  Following the procedure the umbilical sheath was removed after intra-abdominal carbon dioxide was expressed. The incision was closed with subcuticular sutures of 4-0 Vicryl. Dermabond was applied and a sterile dressing. The intrauterine manipulator was then removed.  Instrument, sponge, and needle counts were correct prior to abdominal closure and at the conclusion of the case.   Findings: The anterior cul-de-sac and round ligaments normal The uterus  Fibroids  The adnexa right absent, left adhesions and hydrosalpinx Cul-de-sac adhesions to bowel  Estimated Blood Loss:  Minimal         Drains: Foley catheter         Total IV Fluids: 1061mL         Specimens: none              Complications:  None; patient tolerated the procedure well.         Disposition: PACU - hemodynamically stable.         Condition: stable  Woodroe Mode, MD 05/30/2019 4:17 PM

## 2019-05-30 NOTE — H&P (Signed)
HPI  [name] is a 39 y.o. female. G0P0  Patient's last menstrual period was 05/22/2019 (exact date). She had MRI to characterize a right pelvic cystic mass with right pelvic pain. She is s/p RSO with endometriosis. She was unable to see Dr. Kerin Perna due to insurance coverage. We discussed at length her previous surgery and reviewed pathology report showing RSO. Imaging shows a right sided mass c/w R hydrosalpinx. There is possibility this is left hydrosalpinx with adhesion in the right side, which is confusing the diagnosis.  HPI      Past Medical History:  Diagnosis Date  . Abscess   . BOILS, RECURRENT   . Breast tenderness in female   . BV (bacterial vaginosis)   . DENTAL PAIN   . Endometriosis   . FEMALE INFERTILITY   . Gardnerella vaginalis infection   . IRREGULAR MENSTRUATION   . Ovarian cyst   . SEPTATE UTERUS   . Viral labyrinthitis         Past Surgical History:  Procedure Laterality Date  . biopsy of right breast    . BREAST BIOPSY  1999   right  . OVARIAN CYST REMOVAL  Mar 25 2010  . OVARIAN CYST REMOVAL  03/25/2010   right        Family History  Problem Relation Age of Onset  . Hypertension Mother   . Diabetes Mother   . Cancer Father   . Hypertension Father   . Diabetes Brother   . Hypertension Brother   . Breast cancer Neg Hx    Social History  Social History        Tobacco Use  . Smoking status: Current Every Day Smoker    Packs/day: 0.50    Years: 10.00    Pack years: 5.00    Types: Cigarettes  . Smokeless tobacco: Never Used  Substance Use Topics  . Alcohol use: Yes    Comment: occasionally  . Drug use: No       Allergies  Allergen Reactions  . Sulfa Antibiotics          Current Outpatient Medications  Medication Sig Dispense Refill  . ELDERBERRY PO Take by mouth.    . metroNIDAZOLE (METROGEL VAGINAL) 0.75 % vaginal gel Place 1 Applicatorful vaginally 2 (two) times daily for 5 days. 100 g 0   No current  facility-administered medications for this visit.   Review of Systems  Review of Systems  Constitutional: Negative.  Gastrointestinal: Positive for abdominal pain. Negative for abdominal distention.  Genitourinary: Positive for menstrual problem, pelvic pain and vaginal discharge. Negative for vaginal bleeding.  Musculoskeletal: Negative.   Blood pressure 128/73, pulse 91, temperature 97.8 F (36.6 C), temperature source Oral, resp. rate 14, height 5\' 2"  (1.575 m), weight 74.5 kg, last menstrual period 05/22/2019, SpO2 100 %.  Physical Exam  Vitals reviewed.  Constitutional:  Appearance: Normal appearance.  Pulmonary:  Effort: Pulmonary effort is normal.  Abdominal:  General: There is no distension.  Psychiatric:  Mood and Affect: Mood normal.  Behavior: Behavior normal.   Data Reviewed  MRI and images reviewed  Path report 2012, recent wet prep  Assessment  BV  Probable hydrosalpinx, s/p RSO  infertility   Plan  She is willing to have dx laparoscopy and drainage and possible removal of the pelvic mass. She understands she may not be fertile now or after surgery. The mass may originate from the left tube. Patient desires surgical management . The risks of surgery were  discussed in detail with the patient including but not limited to: bleeding which may require transfusion or reoperation; infection which may require prolonged hospitalization or re-hospitalization and antibiotic therapy; injury to bowel, bladder, ureters and major vessels or other surrounding organs; need for additional procedures including laparotomy; thromboembolic phenomenon, incisional problems and other postoperative or anesthesia complications. Patient was told that the likelihood that her condition and symptoms will be treated effectively with this surgical management was very high; the postoperative expectations were also discussed in detail. The patient also understands the alternative treatment options which  were discussed in full. All questions were answered.    Emeterio Reeve  05/30/2019 2:20 PM

## 2019-05-30 NOTE — Anesthesia Procedure Notes (Signed)
Procedure Name: Intubation Date/Time: 05/30/2019 3:13 PM Performed by: Wanita Chamberlain, CRNA Pre-anesthesia Checklist: Patient identified, Emergency Drugs available, Suction available and Patient being monitored Patient Re-evaluated:Patient Re-evaluated prior to induction Oxygen Delivery Method: Circle system utilized Preoxygenation: Pre-oxygenation with 100% oxygen Induction Type: IV induction Ventilation: Mask ventilation without difficulty Laryngoscope Size: Mac and 3 Grade View: Grade I Tube type: Oral Laser Tube: Cuffed inflated with minimal occlusive pressure - saline Tube size: 7.0 mm Number of attempts: 1 Airway Equipment and Method: Bite block Placement Confirmation: breath sounds checked- equal and bilateral,  CO2 detector,  positive ETCO2 and ETT inserted through vocal cords under direct vision Secured at: 21 cm Tube secured with: Tape Dental Injury: Teeth and Oropharynx as per pre-operative assessment

## 2019-05-30 NOTE — Anesthesia Preprocedure Evaluation (Signed)
Anesthesia Evaluation  Patient identified by MRN, date of birth, ID band Patient awake    Reviewed: Allergy & Precautions, H&P , NPO status , Patient's Chart, lab work & pertinent test results  Airway Mallampati: II   Neck ROM: full    Dental   Pulmonary Current Smoker,    breath sounds clear to auscultation       Cardiovascular negative cardio ROS   Rhythm:regular Rate:Normal     Neuro/Psych    GI/Hepatic   Endo/Other    Renal/GU      Musculoskeletal   Abdominal   Peds  Hematology   Anesthesia Other Findings   Reproductive/Obstetrics Ovarian cyst. endometriosis                             Anesthesia Physical Anesthesia Plan  ASA: II  Anesthesia Plan: General   Post-op Pain Management:    Induction: Intravenous  PONV Risk Score and Plan: 2 and Ondansetron, Dexamethasone, Midazolam and Treatment may vary due to age or medical condition  Airway Management Planned: Oral ETT  Additional Equipment:   Intra-op Plan:   Post-operative Plan: Extubation in OR  Informed Consent: I have reviewed the patients History and Physical, chart, labs and discussed the procedure including the risks, benefits and alternatives for the proposed anesthesia with the patient or authorized representative who has indicated his/her understanding and acceptance.       Plan Discussed with: CRNA, Anesthesiologist and Surgeon  Anesthesia Plan Comments:         Anesthesia Quick Evaluation

## 2019-05-30 NOTE — Anesthesia Postprocedure Evaluation (Signed)
Anesthesia Post Note  Patient: [name]  Procedure(s) Performed: DIAGNOSTIC LAPAROSCOPY (N/A Abdomen)     Patient location during evaluation: PACU Anesthesia Type: General Level of consciousness: awake and alert Pain management: pain level controlled Vital Signs Assessment: post-procedure vital signs reviewed and stable Respiratory status: spontaneous breathing, nonlabored ventilation, respiratory function stable and patient connected to nasal cannula oxygen Cardiovascular status: blood pressure returned to baseline and stable Postop Assessment: no apparent nausea or vomiting Anesthetic complications: no    Last Vitals:  Vitals:   05/30/19 1645 05/30/19 1700  BP:  106/88  Pulse: 81 71  Resp: 17 11  Temp:    SpO2: 100% 99%    Last Pain:  Vitals:   05/30/19 1700  TempSrc:   PainSc: 1                  Ryan P Ellender

## 2019-05-30 NOTE — Discharge Instructions (Signed)
Post Anesthesia Home Care Instructions  Activity: Get plenty of rest for the remainder of the day. A responsible individual must stay with you for 24 hours following the procedure.  For the next 24 hours, DO NOT: -Drive a car -Operate machinery -Drink alcoholic beverages -Take any medication unless instructed by your physician -Make any legal decisions or sign important papers.  Meals: Start with liquid foods such as gelatin or soup. Progress to regular foods as tolerated. Avoid greasy, spicy, heavy foods. If nausea and/or vomiting occur, drink only clear liquids until the nausea and/or vomiting subsides. Call your physician if vomiting continues.  Special Instructions/Symptoms: Your throat may feel dry or sore from the anesthesia or the breathing tube placed in your throat during surgery. If this causes discomfort, gargle with warm salt water. The discomfort should disappear within 24 hours.  If you had a scopolamine patch placed behind your ear for the management of post- operative nausea and/or vomiting:  1. The medication in the patch is effective for 72 hours, after which it should be removed.  Wrap patch in a tissue and discard in the trash. Wash hands thoroughly with soap and water. 2. You may remove the patch earlier than 72 hours if you experience unpleasant side effects which may include dry mouth, dizziness or visual disturbances. 3. Avoid touching the patch. Wash your hands with soap and water after contact with the patch.    DISCHARGE INSTRUCTIONS: Laparoscopy  The following instructions have been prepared to help you care for yourself upon your return home today.  Wound care: . Do not get the incision wet for the first 24 hours. The incision should be kept clean and dry. . The Band-Aids or dressings may be removed the day after surgery. . Should the incision become sore, red, and swollen after the first week, check with your doctor.  Personal hygiene: . Shower the day  after your procedure.  Activity and limitations: . Do NOT drive or operate any equipment today. . Do NOT lift anything more than 15 pounds for 2-3 weeks after surgery. . Do NOT rest in bed all day. . Walking is encouraged. Walk each day, starting slowly with 5-minute walks 3 or 4 times a day. Slowly increase the length of your walks. . Walk up and down stairs slowly. . Do NOT do strenuous activities, such as golfing, playing tennis, bowling, running, biking, weight lifting, gardening, mowing, or vacuuming for 2-4 weeks. Ask your doctor when it is okay to start.  Diet: Eat a light meal as desired this evening. You may resume your usual diet tomorrow.  Return to work: This is dependent on the type of work you do. For the most part you can return to a desk job within a week of surgery. If you are more active at work, please discuss this with your doctor.  What to expect after your surgery: You may have a slight burning sensation when you urinate on the first day. You may have a very small amount of blood in the urine. Expect to have a small amount of vaginal discharge/light bleeding for 1-2 weeks. It is not unusual to have abdominal soreness and bruising for up to 2 weeks. You may be tired and need more rest for about 1 week. You may experience shoulder pain for 24-72 hours. Lying flat in bed may relieve it.  Call your doctor for any of the following: . Develop a fever of 100.4 or greater . Inability to urinate 6 hours after   discharge from hospital . Severe pain not relieved by pain medications . Persistent of heavy bleeding at incision site . Redness or swelling around incision site after a week . Increasing nausea or vomiting  Patient Signature________________________________________ Nurse Signature_________________________________________ 

## 2019-05-30 NOTE — Transfer of Care (Signed)
Immediate Anesthesia Transfer of Care Note  Patient: [name]  Procedure(s) Performed: DIAGNOSTIC LAPAROSCOPY (N/A Abdomen)  Patient Location: PACU  Anesthesia Type:General  Level of Consciousness: awake, alert , oriented and patient cooperative  Airway & Oxygen Therapy: Patient Spontanous Breathing and Patient connected to nasal cannula oxygen  Post-op Assessment: Report given to RN and Post -op Vital signs reviewed and stable  Post vital signs: Reviewed and stable  Last Vitals:  Vitals Value Taken Time  BP    Temp    Pulse 80 05/30/19 1614  Resp 17 05/30/19 1614  SpO2 100 % 05/30/19 1614  Vitals shown include unvalidated device data.  Last Pain:  Vitals:   05/30/19 1317  TempSrc: Oral  PainSc: 0-No pain      Patients Stated Pain Goal: 3 ([id number])  Complications: No apparent anesthesia complications

## 2019-05-31 ENCOUNTER — Telehealth: Payer: Self-pay | Admitting: Obstetrics & Gynecology

## 2019-05-31 NOTE — Telephone Encounter (Signed)
She is requesting a call back. She stated she was out of it, and doesn't remember what Dr Roselie Awkward wanted her to do.

## 2019-05-31 NOTE — Telephone Encounter (Signed)
Received a call from Ms. Lonardo stating she needs her medication. Please give her a call ASAP. She just had surgery.

## 2019-05-31 NOTE — Addendum Note (Signed)
Addendum  created 05/31/19 1052 by Wanita Chamberlain, CRNA   Charge Capture section accepted

## 2019-06-05 ENCOUNTER — Telehealth: Payer: Self-pay | Admitting: Obstetrics & Gynecology

## 2019-06-05 NOTE — Telephone Encounter (Signed)
Patient called in stating that she had surgery on the 20th and no one instructed her on what to do with the bandage. Patient instructed that a nurse will be giving her a call back as soon as they call with these instructions. Patient verbalized understanding and a message was sent to the clinical pool.

## 2019-06-05 NOTE — Telephone Encounter (Signed)
Patient called twice, please see other call documentation.  [name]

## 2019-06-05 NOTE — Telephone Encounter (Signed)
I called [facility] and left a message I was returning her call and will send her a MyChart message ( will reply to the message she sent)since I did not reach her. Kairah Leoni,RN

## 2019-06-06 ENCOUNTER — Other Ambulatory Visit (HOSPITAL_COMMUNITY)
Admission: RE | Admit: 2019-06-06 | Discharge: 2019-06-06 | Disposition: A | Discharge status: Home / Self Care | Payer: 59 | Source: Ambulatory Visit | Attending: Family Medicine | Admitting: Family Medicine

## 2019-06-06 ENCOUNTER — Other Ambulatory Visit: Payer: Self-pay

## 2019-06-06 ENCOUNTER — Ambulatory Visit: Payer: 59 | Admitting: Family Medicine

## 2019-06-06 VITALS — BP 92/62 | HR 84 | Ht 62.0 in | Wt 161.4 lb

## 2019-06-06 DIAGNOSIS — N898 Other specified noninflammatory disorders of vagina: Secondary | ICD-10-CM | POA: Diagnosis not present

## 2019-06-06 DIAGNOSIS — K219 Gastro-esophageal reflux disease without esophagitis: Secondary | ICD-10-CM

## 2019-06-06 DIAGNOSIS — B9689 Other specified bacterial agents as the cause of diseases classified elsewhere: Secondary | ICD-10-CM | POA: Diagnosis not present

## 2019-06-06 DIAGNOSIS — N760 Acute vaginitis: Secondary | ICD-10-CM

## 2019-06-06 LAB — POCT WET PREP (WET MOUNT)
Clue Cells Wet Prep Whiff POC: POSITIVE
Trichomonas Wet Prep HPF POC: ABSENT

## 2019-06-06 MED ORDER — METRONIDAZOLE 500 MG PO TABS: 500.0000 mg | ORAL_TABLET | Freq: Two times a day (BID) | ORAL | 0 refills | Status: AC

## 2019-06-06 MED ORDER — FLUCONAZOLE 150 MG PO TABS: 150.0000 mg | ORAL_TABLET | Freq: Once | ORAL | 0 refills | Status: AC

## 2019-06-06 NOTE — Patient Instructions (Signed)
It was great to see you today! Thank you for letting me participate in your care!  Today, I got some labs and you will be notified of results via MyChart. If you need any medications I will call you and let you know.  Please start taking your Protonix for your GERD two times per day for one week. Then continue taking it once a day for a total of 30 days. If your symptoms are better you can stop taking it. If they are not please return to the clinic.  For your constipation please start taking Miralax one packet once per day. Increase to two packets if you do not have a normal, soft, easy to pass bowel movement. You should have one bowel movement per day.  Be well, Harolyn Rutherford, DO PGY-3, [facility]

## 2019-06-06 NOTE — Progress Notes (Signed)
    SUBJECTIVE:   CHIEF COMPLAINT / HPI:   Vaginal Discharge Patient presents with several days of white, thick discharge, no odor. She states she gets BV frequently. She also admits she has not finished her full course of antibiotics in the past for BV. She feels like she has BV again. She also notes she gets a yeast infection after starting her antibiotics for BV. No rash, fever, abdominal pain, nausea, vomiting. No itching, no blood. No new sexual partner.  GERD Patient endorsing burning in her chest worse after eating meals and when laying down. She was prescribed a PPI back in September as she was diagnosed with GERD but has not been taking it. She states this has gotten worse after having an endoscopic procedure done by her OB-GYN. No chest pain or pressure no shortness of breath or difficulty breathing. Pain located in the epigastric area.  PERTINENT  PMH / PSH: GERD, Hx of BV  OBJECTIVE:   BP 92/62   Pulse 84   Ht 5\' 2"  (1.575 m)   Wt 161 lb 6.4 oz (73.2 kg)   [name] 05/22/2019 (Exact Date)   SpO2 99%   BMI 29.52 kg/m   Physical Exam  Constitutional: She appears well-developed and well-nourished. No distress.  Genitourinary:    Pelvic exam was performed with patient supine.  There is no rash, lesion or injury on the right labia. There is no rash, lesion or injury on the left labia.    Vaginal discharge present.     No vaginal erythema, tenderness or bleeding.  No erythema, tenderness or bleeding in the vagina.  Vitals reviewed.   ASSESSMENT/PLAN:   BV (bacterial vaginosis) Wet prep positive for BV, negative for yeast and negative for Trich. GC/CC pending. HIV declined. - Metronidazole 500mg  BID for 7 days - Diflucan 150mg  once for yeast infection if develops during antibiotic use  GERD (gastroesophageal reflux disease) Patient with active GERD symptoms. Educated patient on how PPIs work and why she should take them. She has all of them from September because she didn't  begin taking it. - Start Protonix 20mg  BID for one week, then decrease to 20mg  once daily - If symptoms well controlled after 3 months can consider stopping     Nuala Alpha, DO [place]

## 2019-06-07 DIAGNOSIS — K219 Gastro-esophageal reflux disease without esophagitis: Secondary | ICD-10-CM | POA: Insufficient documentation

## 2019-06-07 LAB — CERVICOVAGINAL ANCILLARY ONLY
Chlamydia: NEGATIVE
Comment: NEGATIVE
Comment: NORMAL
Neisseria Gonorrhea: NEGATIVE

## 2019-06-07 NOTE — Assessment & Plan Note (Addendum)
Patient with active GERD symptoms. Educated patient on how PPIs work and why she should take them. She has all of them from September because she didn't begin taking it. - Start Protonix 20mg  BID for one week, then decrease to 20mg  once daily - If symptoms well controlled after 3 months can consider stopping

## 2019-06-07 NOTE — Assessment & Plan Note (Signed)
Wet prep positive for BV, negative for yeast and negative for Trich. GC/CC pending. HIV declined. - Metronidazole 500mg  BID for 7 days - Diflucan 150mg  once for yeast infection if develops during antibiotic use

## 2019-06-14 ENCOUNTER — Other Ambulatory Visit: Payer: Self-pay | Admitting: Family Medicine

## 2019-06-14 ENCOUNTER — Telehealth: Payer: Self-pay

## 2019-06-14 MED ORDER — METRONIDAZOLE 500 MG PO TABS: 500.0000 mg | ORAL_TABLET | Freq: Two times a day (BID) | ORAL | 0 refills | Status: DC

## 2019-06-14 NOTE — Telephone Encounter (Signed)
Patient calls nurse line requesting a refill on Flagyl. Patient stated she was in the office recently and saw Dr. Garlan Fillers. Patient is requesting me to ask him for an additional refill on medication. Patient states I get BV all the time and I would like to have some on hand. Patient has completed current course and took diflucan. Patient states she still has the thick discharge that is uncomfortable. Will forward to [place].

## 2019-06-14 NOTE — Progress Notes (Signed)
Patient left message requesting some flagyl to have for reoccurring BV. She has had this multiple times. I will give her one refill but if she continues to have symptoms she should probably go to OBGYN to discuss alternatives as it seems metronidazole is not fully treating her BV.

## 2019-06-15 ENCOUNTER — Encounter (HOSPITAL_COMMUNITY): Payer: Self-pay

## 2019-06-15 ENCOUNTER — Ambulatory Visit (HOSPITAL_COMMUNITY)
Admission: EM | Admit: 2019-06-15 | Discharge: 2019-06-15 | Disposition: A | Discharge status: Home / Self Care | Payer: 59 | Attending: Family Medicine | Admitting: Family Medicine

## 2019-06-15 ENCOUNTER — Inpatient Hospital Stay (HOSPITAL_COMMUNITY): Admission: RE | Admit: 2019-06-15 | Payer: 59 | Source: Ambulatory Visit

## 2019-06-15 ENCOUNTER — Other Ambulatory Visit: Payer: Self-pay

## 2019-06-15 DIAGNOSIS — Z711 Person with feared health complaint in whom no diagnosis is made: Secondary | ICD-10-CM | POA: Diagnosis not present

## 2019-06-15 DIAGNOSIS — H9202 Otalgia, left ear: Secondary | ICD-10-CM | POA: Insufficient documentation

## 2019-06-15 MED ORDER — ACETIC ACID 2 % OT SOLN: 4.0000 [drp] | Freq: Three times a day (TID) | OTIC | 0 refills | Status: DC

## 2019-06-15 NOTE — ED Triage Notes (Signed)
PT c/o 4/10 left ear pain. Pt states she gets ear pain when she swallows and it itchesx2 days. Pt wants STI tests. PT c/o white dischargex4 days. Pt wants an oral swab also.

## 2019-06-15 NOTE — ED Provider Notes (Signed)
[place]    CSN: [id number] Arrival date & time: 06/15/19  0801      History   Chief Complaint Chief Complaint  Patient presents with  . Otitis Media    HPI [name] is a 39 y.o. female.   HPI Left ear pain and itching x 2 days. Denies any URI symptoms.  Itching is worse with swallowing. Denies any FB or fluid making contact with ear. No fever.  Sexually Transmitted Disease Check: Patient presents for sexually transmitted disease check. Sexual history reviewed with the patient. STD exposure: denies knowledge of risky exposure.  Previous history of STD:  BV and HSV. Current symptoms include vaginal discharge thicken and white.   Past Medical History:  Diagnosis Date  . Endometriosis   . History of 2019 novel coronavirus disease (COVID-19)    pt tested positive 03-03-2019, results in care everywhere.  (05-22-2019 per pt had symptoms of fatigue, headache, and loss of taste/ smell;  fatigue and headache after one week and loss taste/ smell resolved after 3 wks)  . Infertility, female   . Ovarian cyst   . Pelvic mass in female    large fluid filled pelvic mass  . Recurrent boils   . Septate uterus     Patient Active Problem List   Diagnosis Date Noted  . GERD (gastroesophageal reflux disease) 06/07/2019  . HSV-2 seropositive 03/07/2019  . Hydrosalpinx 12/30/2018  . Screening examination for STD (sexually transmitted disease) 07/09/2017  . Breast mass in female 01/22/2013  . Cervical cancer screening 01/11/2013  . BV (bacterial vaginosis) 01/18/2012  . General counseling for prescription of oral contraceptives 11/06/2011  . Endometriosis 05/01/2011  . Ovarian cyst   . Carbuncle and furuncle 12/27/2009  . SEPTATE UTERUS 07/24/2009  . TOBACCO ABUSE 09/26/2008  . Female infertility 09/26/2008  . VIRAL LABYRINTHITIS 06/20/2007  . IRREGULAR MENSTRUATION 09/21/2006    Past Surgical History:  Procedure Laterality Date  . BREAST BIOPSY Right 1999   per  pt benign   . LAPAROSCOPIC SALPINGOOPHERECTOMY Right 03-25-2010   @[facility]    and LYSIS ADHESIONS  . LAPAROSCOPY N/A 05/30/2019   Procedure: DIAGNOSTIC LAPAROSCOPY;  Surgeon: Woodroe Mode, MD;  Location: [facility];  Service: Gynecology;  Laterality: N/A;    OB History    Gravida  0   Para      Term      Preterm      AB      Living  0     SAB      TAB      Ectopic      Multiple      Live Births               Home Medications    Prior to Admission medications   Medication Sig Start Date End Date Taking? Authorizing Provider  Probiotic Product (CULTURELLE PROBIOTICS PO) Take by mouth.   Yes [provider]  Ca Carbonate-Mag Hydroxide (ROLAIDS PO) Take by mouth as needed.    [provider]  ELDERBERRY PO Take by mouth.    [provider]  metroNIDAZOLE (FLAGYL) 500 MG tablet Take 1 tablet (500 mg total) by mouth 2 (two) times daily. 06/14/19   Nuala Alpha, DO  oxyCODONE-acetaminophen (PERCOCET/ROXICET) 5-325 MG tablet Take 1-2 tablets by mouth every 6 (six) hours as needed. 05/30/19   Woodroe Mode, MD  pantoprazole (PROTONIX) 20 MG tablet Take 20 mg by mouth daily.    [provider]  Norgestimate-Ethinyl Estradiol Triphasic (TRI-SPRINTEC) 0.18/0.215/0.25 MG-35 MCG tablet Take 1 tablet by mouth daily. 11/03/10 05/01/11  Woodroe Mode, MD    Family History Family History  Problem Relation Age of Onset  . Hypertension Mother   . Diabetes Mother   . Cancer Father   . Hypertension Father   . Diabetes Brother   . Hypertension Brother   . Breast cancer Neg Hx     Social History Social History   Tobacco Use  . Smoking status: Current Every Day Smoker    Packs/day: 0.25    Years: 15.00    Pack years: 3.75    Types: Cigarettes  . Smokeless tobacco: Never Used  Substance Use Topics  . Alcohol use: Yes    Comment: occasionally  . Drug use: Never     Allergies   Sulfa antibiotics  Review of  Systems Review of Systems Pertinent negatives listed in HPI  Physical Exam Triage Vital Signs ED Triage Vitals  Enc Vitals Group     BP 06/15/19 0820 104/69     Pulse Rate 06/15/19 0820 (!) 103     Resp 06/15/19 0820 16     Temp 06/15/19 0820 98.5 F (36.9 C)     Temp Source 06/15/19 0820 Oral     SpO2 06/15/19 0820 94 %     Weight 06/15/19 0821 162 lb (73.5 kg)     Height 06/15/19 0821 5\' 2"  (1.575 m)     Head Circumference --      Peak Flow --      Pain Score 06/15/19 0821 4     Pain Loc --      Pain Edu? --      Excl. in [place]? --    No data found.  Updated Vital Signs BP 104/69   Pulse (!) 103   Temp 98.5 F (36.9 C) (Oral)   Resp 16   Ht 5\' 2"  (1.575 m)   Wt 162 lb (73.5 kg)   [name] 05/22/2019 (Exact Date)   SpO2 94%   BMI 29.63 kg/m   Visual Acuity Right Eye Distance:   Left Eye Distance:   Bilateral Distance:    Right Eye Near:   Left Eye Near:    Bilateral Near:     Physical Exam HENT:     Head: Normocephalic.     Ears:     Comments: Left ear canal dryness and like erythema Cardiovascular:     Rate and Rhythm: Tachycardia present.     Pulses: Normal pulses.  Pulmonary:     Effort: Pulmonary effort is normal.     Breath sounds: Normal breath sounds.  Musculoskeletal:     Cervical back: Normal range of motion.  Skin:    General: Skin is warm and dry.     Capillary Refill: Capillary refill takes less than 2 seconds.  Neurological:     Mental Status: She is alert and oriented to person, place, and time.  Psychiatric:        Mood and Affect: Mood normal.        Thought Content: Thought content normal.        Judgment: Judgment normal.      UC Treatments / Results  Labs (all labs ordered are listed, but only abnormal results are displayed) Labs Reviewed - No data to display  EKG   Radiology No results found.  Procedures Procedures (including critical care time)  Medications Ordered in UC Medications - No data  to display  Initial  Impression / Assessment and Plan / UC Course  I have reviewed the triage vital signs and the nursing notes.  Pertinent labs & imaging results that were available during my care of the patient were reviewed by me and considered in my medical decision making (see chart for details).    Concern about STD in female without diagnosis -Vaginal and oral cytology pending.  Left ear pain -Dry ear canal, trial hydrocortisone otic drops twice daily x 7 days    Final Clinical Impressions(s) / UC Diagnoses   Final diagnoses:  Concern about STD in female without diagnosis  Left ear pain     Discharge Instructions     Your results will be available via MyChart within 3-5 days. We will contact you if any treatment is warranted. Use hydrocortisone drops as directed for 7 days. If ear pain worsen or doesn't improve return for follow-up or contact PCP.    ED Prescriptions    Medication Sig Dispense Auth. Provider   acetic acid 2 % otic solution Place 4 drops into the left ear 3 (three) times daily. 15 mL Scot Jun, FNP     PDMP not reviewed this encounter.   Scot Jun,  06/15/19 [phone]

## 2019-06-15 NOTE — Discharge Instructions (Addendum)
Your results will be available via MyChart within 3-5 days. We will contact you if any treatment is warranted. Use hydrocortisone drops as directed for 7 days. If ear pain worsen or doesn't improve return for follow-up or contact PCP.

## 2019-06-16 ENCOUNTER — Encounter: Payer: Self-pay | Admitting: Family Medicine

## 2019-06-16 ENCOUNTER — Ambulatory Visit (INDEPENDENT_AMBULATORY_CARE_PROVIDER_SITE_OTHER): Payer: 59 | Admitting: Family Medicine

## 2019-06-16 ENCOUNTER — Telehealth (HOSPITAL_COMMUNITY): Payer: Self-pay

## 2019-06-16 VITALS — BP 106/71 | HR 78 | Ht 62.0 in | Wt 168.3 lb

## 2019-06-16 DIAGNOSIS — N760 Acute vaginitis: Secondary | ICD-10-CM

## 2019-06-16 DIAGNOSIS — B9689 Other specified bacterial agents as the cause of diseases classified elsewhere: Secondary | ICD-10-CM

## 2019-06-16 DIAGNOSIS — N809 Endometriosis, unspecified: Secondary | ICD-10-CM | POA: Diagnosis not present

## 2019-06-16 LAB — CYTOLOGY, (ORAL, ANAL, URETHRAL) ANCILLARY ONLY
Chlamydia: NEGATIVE
Comment: NEGATIVE
Comment: NEGATIVE
Comment: NORMAL
Neisseria Gonorrhea: NEGATIVE
Trichomonas: NEGATIVE

## 2019-06-16 LAB — CERVICOVAGINAL ANCILLARY ONLY
Bacterial Vaginitis (gardnerella): POSITIVE — AB
Candida Glabrata: NEGATIVE
Candida Vaginitis: NEGATIVE
Chlamydia: NEGATIVE
Comment: NEGATIVE
Comment: NEGATIVE
Comment: NEGATIVE
Comment: NEGATIVE
Comment: NEGATIVE
Comment: NORMAL
Neisseria Gonorrhea: NEGATIVE
Trichomonas: NEGATIVE

## 2019-06-16 MED ORDER — NORETHIN ACE-ETH ESTRAD-FE 1-20 MG-MCG(24) PO TABS: 1.0000 | ORAL_TABLET | Freq: Every day | ORAL | 11 refills | Status: DC

## 2019-06-16 MED ORDER — METRONIDAZOLE 500 MG PO TABS: 500.0000 mg | ORAL_TABLET | Freq: Two times a day (BID) | ORAL | 0 refills | Status: DC

## 2019-06-16 NOTE — Patient Instructions (Signed)
Endometriosis  Endometriosis is a condition in which the tissue that lines the uterus (endometrium) grows outside of its normal location. The tissue may grow in many locations close to the uterus, but it commonly grows on the ovaries, fallopian tubes, vagina, or bowel. When the uterus sheds the endometrium every menstrual cycle, there is bleeding wherever the endometrial tissue is located. This can cause pain because blood is irritating to tissues that are not normally exposed to it. What are the causes? The cause of endometriosis is not known. What increases the risk? You may be more likely to develop endometriosis if you:  Have a family history of endometriosis.  Have never given birth.  Started your period at age 10 or younger.  Have high levels of estrogen in your body.  Were exposed to a certain medicine (diethylstilbestrol) before you were born (in utero).  Had low birth weight.  Were born as a twin, triplet, or other multiple.  Have a BMI of less than 25. BMI is an estimate of body fat and is calculated from height and weight. What are the signs or symptoms? Often, there are no symptoms of this condition. If you do have symptoms, they may:  Vary depending on where your endometrial tissue is growing.  Occur during your menstrual period (most common) or midcycle.  Come and go, or you may go months with no symptoms at all.  Stop with menopause. Symptoms may include:  Pain in the back or abdomen.  Heavier bleeding during periods.  Pain during sex.  Painful bowel movements.  Infertility.  Pelvic pain.  Bleeding more than once a month. How is this diagnosed? This condition is diagnosed based on your symptoms and a physical exam. You may have tests, such as:  Blood tests and urine tests. These may be done to help rule out other possible causes of your symptoms.  Ultrasound, to look for abnormal tissues.  An X-ray of the lower bowel (barium enema).  An  ultrasound that is done through the vagina (transvaginally).  CT scan.  MRI.  Laparoscopy. In this procedure, a lighted, pencil-sized instrument called a laparoscope is inserted into your abdomen through an incision. The laparoscope allows your health care provider to look at the organs inside your body and check for abnormal tissue to confirm the diagnosis. If abnormal tissue is found, your health care provider may remove a small piece of tissue (biopsy) to be examined under a microscope. How is this treated? Treatment for this condition may include:  Medicines to relieve pain, such as NSAIDs.  Hormone therapy. This involves using artificial (synthetic) hormones to reduce endometrial tissue growth. Your health care provider may recommend using a hormonal form of birth control, or other medicines.  Surgery. This may be done to remove abnormal endometrial tissue. ? In some cases, tissue may be removed using a laparoscope and a laser (laparoscopic laser treatment). ? In severe cases, surgery may be done to remove the fallopian tubes, uterus, and ovaries (hysterectomy). Follow these instructions at home:  Take over-the-counter and prescription medicines only as told by your health care provider.  Do not drive or use heavy machinery while taking prescription pain medicine.  Try to avoid activities that cause pain, including sexual activity.  Keep all follow-up visits as told by your health care provider. This is important. Contact a health care provider if:  You have pain in the area between your hip bones (pelvic area) that occurs: ? Before, during, or after your period. ?   In between your period and gets worse during your period. ? During or after sex. ? With bowel movements or urination, especially during your period.  You have problems getting pregnant.  You have a fever. Get help right away if:  You have severe pain that does not get better with medicine.  You have severe  nausea and vomiting, or you cannot eat without vomiting.  You have pain that affects only the lower, right side of your abdomen.  You have abdominal pain that gets worse.  You have abdominal swelling.  You have blood in your stool. This information is not intended to replace advice given to you by your health care provider. Make sure you discuss any questions you have with your health care provider. Document Revised: 01/08/2017 Document Reviewed: 06/29/2015 Elsevier Patient Education  2020 Elsevier Inc.  

## 2019-06-16 NOTE — Progress Notes (Signed)
   Subjective:    Patient ID: [name] is a 39 y.o. female presenting with Follow-up  on 06/16/2019  HPI: Here for postop check. She is s/p diagnostic laparoscopy with significant pelvic adhesions on right and left with pelvic masses likely related to h/o endometriosis and PID. She is s/p RSO in 2012 with very similar description of the pelvic pathology on left including recto-cecal adhesions. Pathology from that surgery shows endometriosis. Pelvic MRI shows 8.7 cm tubular structure on right, though right tube has been removed and suspect this might be peritoneal inclusion cyst. Has painful heavy cycles. Not on any hormonal treatments. Has septated uterus on MRI. Has recurrent BV. No baths, no douching. Has Boric acid at home.    Review of Systems  Constitutional: Negative for chills and fever.  Respiratory: Negative for shortness of breath.   Cardiovascular: Negative for chest pain.  Gastrointestinal: Negative for abdominal pain, nausea and vomiting.  Genitourinary: Negative for dysuria.  Skin: Negative for rash.      Objective:    BP 106/71   Pulse 78   Ht 5\' 2"  (1.575 m)   Wt 168 lb 4.8 oz (76.3 kg)   [name] 05/22/2019 (Exact Date)   BMI 30.78 kg/m  Physical Exam Constitutional:      General: She is not in acute distress.    Appearance: She is well-developed.  HENT:     Head: Normocephalic and atraumatic.  Eyes:     General: No scleral icterus. Cardiovascular:     Rate and Rhythm: Normal rate.  Pulmonary:     Effort: Pulmonary effort is normal.  Abdominal:     Palpations: Abdomen is soft.  Musculoskeletal:     Cervical back: Neck supple.  Skin:    General: Skin is warm and dry.     Comments: Umbilical incision is well healed  Neurological:     Mental Status: She is alert and oriented to person, place, and time.         Assessment & Plan:   Problem List Items Addressed This Visit      Unprioritized   Endometriosis - Primary    Given painful, heavy  cycles and biopsy proven endometriosis, will begin low dose OC's. Offered IUD, though her septate uterus is probably not a good option. May skip placebos eventually or require higher dose COC's. She has no true contra-indication to OCs that I can discern. Normotensive, not diabetic, no h/o DVT in her or family. Discussed pelvic masses in great detail. Suspect peritoneal inclusion cyst on right, given op note description of removal of right tube + ovary. Also with h/o PID which is also contributing. Patient is without pain when not on cycle. Given descriptions at surgery, if symptom control for painful cycles and heavy bleeding, might avoid any further surgeries, unless malignancy if possibility, but nothing suspicious right now.      Relevant Medications   Norethindrone Acetate-Ethinyl Estrad-FE (LOESTRIN 24 FE) 1-20 MG-MCG(24) tablet    Other Visit Diagnoses    Bacterial vaginosis       Advised boric acid bid with flair, otherwise, use 2-3x/week as preventive.      Total time: 31 minutes.  Return in about 4 weeks (around 07/14/2019) for virtual with Dr. Roselie Awkward.  Donnamae Jude 06/16/2019 11:19 AM

## 2019-06-16 NOTE — Assessment & Plan Note (Signed)
Given painful, heavy cycles and biopsy proven endometriosis, will begin low dose OC's. Offered IUD, though her septate uterus is probably not a good option. May skip placebos eventually or require higher dose COC's. She has no true contra-indication to OCs that I can discern. Normotensive, not diabetic, no h/o DVT in her or family. Discussed pelvic masses in great detail. Suspect peritoneal inclusion cyst on right, given op note description of removal of right tube + ovary. Also with h/o PID which is also contributing. Patient is without pain when not on cycle. Given descriptions at surgery, if symptom control for painful cycles and heavy bleeding, might avoid any further surgeries, unless malignancy if possibility, but nothing suspicious right now.

## 2019-06-19 DIAGNOSIS — Z029 Encounter for administrative examinations, unspecified: Secondary | ICD-10-CM

## 2019-06-28 ENCOUNTER — Encounter (HOSPITAL_COMMUNITY): Payer: Self-pay

## 2019-06-28 ENCOUNTER — Ambulatory Visit (HOSPITAL_COMMUNITY)
Admission: EM | Admit: 2019-06-28 | Discharge: 2019-06-28 | Disposition: A | Discharge status: Home / Self Care | Payer: 59 | Attending: Family Medicine | Admitting: Family Medicine

## 2019-06-28 ENCOUNTER — Other Ambulatory Visit: Payer: Self-pay

## 2019-06-28 DIAGNOSIS — Z3202 Encounter for pregnancy test, result negative: Secondary | ICD-10-CM

## 2019-06-28 DIAGNOSIS — R109 Unspecified abdominal pain: Secondary | ICD-10-CM

## 2019-06-28 DIAGNOSIS — N898 Other specified noninflammatory disorders of vagina: Secondary | ICD-10-CM | POA: Diagnosis present

## 2019-06-28 LAB — POCT URINALYSIS DIP (DEVICE)
Glucose, UA: NEGATIVE mg/dL
Hgb urine dipstick: NEGATIVE
Ketones, ur: NEGATIVE mg/dL
Leukocytes,Ua: NEGATIVE
Nitrite: NEGATIVE
Protein, ur: NEGATIVE mg/dL
Specific Gravity, Urine: >=1.03 (ref 1.005–1.030)
Urobilinogen, UA: 0.2 mg/dL (ref 0.0–1.0)
pH: 6 (ref 5.0–8.0)

## 2019-06-28 LAB — POC URINE PREG, ED: Preg Test, Ur: NEGATIVE

## 2019-06-28 LAB — HIV ANTIBODY (ROUTINE TESTING W REFLEX): HIV Screen 4th Generation wRfx: NONREACTIVE

## 2019-06-28 NOTE — Discharge Instructions (Signed)
We will call with the results if positive  We will send in medicine if needed Please follow up if your symptoms fail to improve.

## 2019-06-28 NOTE — ED Triage Notes (Signed)
Pt c/o vaginal discharge-clear/white with foul odor for approx 3 days. Also c/o lower abdom pain, diarrhea today.  Pt denies fever, chills, n/v, dysuria sx.   Pt requests HIV screening.

## 2019-06-28 NOTE — ED Provider Notes (Signed)
[facility]    CSN: [id number] Arrival date & time: 06/28/19  1613      History   Chief Complaint Chief Complaint  Patient presents with  . Vaginal Discharge  . Abdominal Pain    HPI [name] is a 39 y.o. female.  She is presenting with vaginal discharge. She has been sexually active. She has concern for possibly STD exposure. Denies any dysuria.  HPI  Past Medical History:  Diagnosis Date  . Endometriosis   . History of 2019 novel coronavirus disease (COVID-19)    pt tested positive 03-03-2019, results in care everywhere.  (05-22-2019 per pt had symptoms of fatigue, headache, and loss of taste/ smell;  fatigue and headache after one week and loss taste/ smell resolved after 3 wks)  . Infertility, female   . Ovarian cyst   . Pelvic mass in female    large fluid filled pelvic mass  . Recurrent boils   . Septate uterus     Patient Active Problem List   Diagnosis Date Noted  . GERD (gastroesophageal reflux disease) 06/07/2019  . HSV-2 seropositive 03/07/2019  . Hydrosalpinx 12/30/2018  . Screening examination for STD (sexually transmitted disease) 07/09/2017  . Breast mass in female 01/22/2013  . BV (bacterial vaginosis) 01/18/2012  . Endometriosis 05/01/2011  . SEPTATE UTERUS 07/24/2009  . TOBACCO ABUSE 09/26/2008  . Female infertility 09/26/2008  . IRREGULAR MENSTRUATION 09/21/2006    Past Surgical History:  Procedure Laterality Date  . BREAST BIOPSY Right 1999   per pt benign   . LAPAROSCOPIC SALPINGOOPHERECTOMY Right 03-25-2010   @[facility]    and LYSIS ADHESIONS  . LAPAROSCOPY N/A 05/30/2019   Procedure: DIAGNOSTIC LAPAROSCOPY;  Surgeon: Woodroe Mode, MD;  Location: [facility];  Service: Gynecology;  Laterality: N/A;    OB History    Gravida  0   Para      Term      Preterm      AB      Living  0     SAB      TAB      Ectopic      Multiple      Live Births               Home Medications    Prior to  Admission medications   Medication Sig Start Date End Date Taking? Authorizing Provider  acetic acid 2 % otic solution Place 4 drops into the left ear 3 (three) times daily. 06/15/19   Scot Jun, FNP  Ca Carbonate-Mag Hydroxide (ROLAIDS PO) Take by mouth as needed.    [provider]  ELDERBERRY PO Take by mouth.    [provider]  metroNIDAZOLE (FLAGYL) 500 MG tablet Take 1 tablet (500 mg total) by mouth 2 (two) times daily. 06/16/19   Lamptey, Myrene Galas, MD  Norethindrone Acetate-Ethinyl Estrad-FE (LOESTRIN 24 FE) 1-20 MG-MCG(24) tablet Take 1 tablet by mouth daily. 06/16/19   Donnamae Jude, MD  oxyCODONE-acetaminophen (PERCOCET/ROXICET) 5-325 MG tablet Take 1-2 tablets by mouth every 6 (six) hours as needed. Patient not taking: Reported on 06/16/2019 05/30/19   Woodroe Mode, MD  pantoprazole (PROTONIX) 20 MG tablet Take 20 mg by mouth daily.    [provider]  Probiotic Product (CULTURELLE PROBIOTICS PO) Take by mouth.    [provider]  Norgestimate-Ethinyl Estradiol Triphasic (TRI-SPRINTEC) 0.18/0.215/0.25 MG-35 MCG tablet Take 1 tablet by mouth daily. 11/03/10 05/01/11  Woodroe Mode, MD  Family History Family History  Problem Relation Age of Onset  . Hypertension Mother   . Diabetes Mother   . Cancer Father   . Hypertension Father   . Diabetes Brother   . Hypertension Brother   . Breast cancer Neg Hx     Social History Social History   Tobacco Use  . Smoking status: Current Every Day Smoker    Packs/day: 0.25    Years: 15.00    Pack years: 3.75    Types: Cigarettes  . Smokeless tobacco: Never Used  Substance Use Topics  . Alcohol use: Yes    Comment: occasionally  . Drug use: Never     Allergies   Sulfa antibiotics   Review of Systems Review of Systems  See HPI  Physical Exam Triage Vital Signs ED Triage Vitals  Enc Vitals Group     BP 06/28/19 1704 103/72     Pulse Rate 06/28/19 1704 82     Resp 06/28/19 1704  18     Temp 06/28/19 1704 98.1 F (36.7 C)     Temp Source 06/28/19 1704 Oral     SpO2 06/28/19 1704 100 %     Weight --      Height --      Head Circumference --      Peak Flow --      Pain Score 06/28/19 1702 4     Pain Loc --      Pain Edu? --      Excl. in [place]? --    No data found.  Updated Vital Signs BP 103/72 (BP Location: Left Arm)   Pulse 82   Temp 98.1 F (36.7 C) (Oral)   Resp 18   [name] 06/23/2019   SpO2 100%   Visual Acuity Right Eye Distance:   Left Eye Distance:   Bilateral Distance:    Right Eye Near:   Left Eye Near:    Bilateral Near:     Physical Exam Gen: NAD, alert, cooperative with exam, well-appearing ENT: normal lips, normal nasal mucosa,  Eye: normal EOM, normal conjunctiva and lids Skin: no rashes, no areas of induration  Neuro: normal tone, normal sensation to touch Psych:  normal insight, alert and oriented    UC Treatments / Results  Labs (all labs ordered are listed, but only abnormal results are displayed) Labs Reviewed  POCT URINALYSIS DIP (DEVICE) - Abnormal; Notable for the following components:      Result Value   Bilirubin Urine SMALL (*)    All other components within normal limits  HIV ANTIBODY (ROUTINE TESTING W REFLEX)  RPR  POC URINE PREG, ED  CERVICOVAGINAL ANCILLARY ONLY    EKG   Radiology No results found.  Procedures Procedures (including critical care time)  Medications Ordered in UC Medications - No data to display  Initial Impression / Assessment and Plan / UC Course  I have reviewed the triage vital signs and the nursing notes.  Pertinent labs & imaging results that were available during my care of the patient were reviewed by me and considered in my medical decision making (see chart for details).     [name] is a 40 year old female that has concern for possible STD exposure. Cytology swab was obtained. We'll test for HIV and RPR. Counseled supportive care. Will call with results. Given  indications to follow-up return.  Final Clinical Impressions(s) / UC Diagnoses   Final diagnoses:  Vaginal discharge     Discharge Instructions  We will call with the results if positive  We will send in medicine if needed Please follow up if your symptoms fail to improve.     ED Prescriptions    None     PDMP not reviewed this encounter.   Rosemarie Ax, MD 06/28/19 1901

## 2019-06-29 LAB — RPR: RPR Ser Ql: NONREACTIVE

## 2019-06-30 ENCOUNTER — Telehealth (HOSPITAL_COMMUNITY): Payer: Self-pay | Admitting: Orthopedic Surgery

## 2019-06-30 LAB — CERVICOVAGINAL ANCILLARY ONLY
Bacterial Vaginitis (gardnerella): POSITIVE — AB
Candida Glabrata: POSITIVE — AB
Candida Vaginitis: NEGATIVE
Chlamydia: NEGATIVE
Comment: NEGATIVE
Comment: NEGATIVE
Comment: NEGATIVE
Comment: NEGATIVE
Comment: NEGATIVE
Comment: NORMAL
Neisseria Gonorrhea: NEGATIVE
Trichomonas: NEGATIVE

## 2019-06-30 MED ORDER — METRONIDAZOLE 500 MG PO TABS: 500.0000 mg | ORAL_TABLET | Freq: Two times a day (BID) | ORAL | 0 refills | Status: DC

## 2019-06-30 MED ORDER — FLUCONAZOLE 200 MG PO TABS: 200.0000 mg | ORAL_TABLET | Freq: Every day | ORAL | 0 refills | Status: AC

## 2019-07-05 ENCOUNTER — Ambulatory Visit (INDEPENDENT_AMBULATORY_CARE_PROVIDER_SITE_OTHER): Payer: 59 | Admitting: Family Medicine

## 2019-07-05 ENCOUNTER — Other Ambulatory Visit (HOSPITAL_COMMUNITY)
Admission: RE | Admit: 2019-07-05 | Discharge: 2019-07-05 | Disposition: A | Discharge status: Home / Self Care | Payer: 59 | Source: Ambulatory Visit | Attending: Family Medicine | Admitting: Family Medicine

## 2019-07-05 ENCOUNTER — Other Ambulatory Visit: Payer: Self-pay

## 2019-07-05 VITALS — BP 102/60 | HR 79 | Ht 62.0 in | Wt 167.4 lb

## 2019-07-05 DIAGNOSIS — R300 Dysuria: Secondary | ICD-10-CM

## 2019-07-05 DIAGNOSIS — N898 Other specified noninflammatory disorders of vagina: Secondary | ICD-10-CM | POA: Diagnosis not present

## 2019-07-05 LAB — POCT WET PREP (WET MOUNT)
Clue Cells Wet Prep Whiff POC: POSITIVE
Trichomonas Wet Prep HPF POC: ABSENT

## 2019-07-05 NOTE — Patient Instructions (Addendum)
Today we did a quick urine test to check on potential of a urinary tract infection.  We also did a swab to see if there was any new illness after most recent sexual encounter.  We will let you know if anything comes back next be treated.  I do think that the bump you found by rectum is an external hemorrhoid.  I am including some information below in some ways that you can learn some more about this issue.  Hemorrhoids Hemorrhoids are swollen veins that may develop:  In the butt (rectum). These are called internal hemorrhoids.  Around the opening of the butt (anus). These are called external hemorrhoids. Hemorrhoids can cause pain, itching, or bleeding. Most of the time, they do not cause serious problems. They usually get better with diet changes, lifestyle changes, and other home treatments. What are the causes? This condition may be caused by:  Having trouble pooping (constipation).  Pushing hard (straining) to poop.  Watery poop (diarrhea).  Pregnancy.  Being very overweight (obese).  Sitting for long periods of time.  Heavy lifting or other activity that causes you to strain.  Anal sex.  Riding a bike for a long period of time. What are the signs or symptoms? Symptoms of this condition include:  Pain.  Itching or soreness in the butt.  Bleeding from the butt.  Leaking poop.  Swelling in the area.  One or more lumps around the opening of your butt. How is this diagnosed? A doctor can often diagnose this condition by looking at the affected area. The doctor may also:  Do an exam that involves feeling the area with a gloved hand (digital rectal exam).  Examine the area inside your butt using a small tube (anoscope).  Order blood tests. This may be done if you have lost a lot of blood.  Have you get a test that involves looking inside the colon using a flexible tube with a camera on the end (sigmoidoscopy or colonoscopy). How is this treated? This condition can  usually be treated at home. Your doctor may tell you to change what you eat, make lifestyle changes, or try home treatments. If these do not help, procedures can be done to remove the hemorrhoids or make them smaller. These may involve:  Placing rubber bands at the base of the hemorrhoids to cut off their blood supply.  Injecting medicine into the hemorrhoids to shrink them.  Shining a type of light energy onto the hemorrhoids to cause them to fall off.  Doing surgery to remove the hemorrhoids or cut off their blood supply. Follow these instructions at home: Eating and drinking   Eat foods that have a lot of fiber in them. These include whole grains, beans, nuts, fruits, and vegetables.  Ask your doctor about taking products that have added fiber (fibersupplements).  Reduce the amount of fat in your diet. You can do this by: ? Eating low-fat dairy products. ? Eating less red meat. ? Avoiding processed foods.  Drink enough fluid to keep your pee (urine) pale yellow. Managing pain and swelling   Take a warm-water bath (sitz bath) for 20 minutes to ease pain. Do this 3-4 times a day. You may do this in a bathtub or using a portable sitz bath that fits over the toilet.  If told, put ice on the painful area. It may be helpful to use ice between your warm baths. ? Put ice in a plastic bag. ? Place a towel between your  skin and the bag. ? Leave the ice on for 20 minutes, 2-3 times a day. General instructions  Take over-the-counter and prescription medicines only as told by your doctor. ? Medicated creams and medicines may be used as told.  Exercise often. Ask your doctor how much and what kind of exercise is best for you.  Go to the bathroom when you have the urge to poop. Do not wait.  Avoid pushing too hard when you poop.  Keep your butt dry and clean. Use wet toilet paper or moist towelettes after pooping.  Do not sit on the toilet for a long time.  Keep all follow-up  visits as told by your doctor. This is important. Contact a doctor if you:  Have pain and swelling that do not get better with treatment or medicine.  Have trouble pooping.  Cannot poop.  Have pain or swelling outside the area of the hemorrhoids. Get help right away if you have:  Bleeding that will not stop. Summary  Hemorrhoids are swollen veins in the butt or around the opening of the butt.  They can cause pain, itching, or bleeding.  Eat foods that have a lot of fiber in them. These include whole grains, beans, nuts, fruits, and vegetables.  Take a warm-water bath (sitz bath) for 20 minutes to ease pain. Do this 3-4 times a day. This information is not intended to replace advice given to you by your health care provider. Make sure you discuss any questions you have with your health care provider. Document Revised: 02/03/2018 Document Reviewed: 06/17/2017 Elsevier Patient Education  [place].

## 2019-07-06 ENCOUNTER — Telehealth: Payer: Self-pay

## 2019-07-06 NOTE — Telephone Encounter (Signed)
Patient LVM on nurse line requesting lab results from yesterdays visit. I see she has BV, however the POC urinalysis doesn't look like it was run? Will forward to provider who saw patient.

## 2019-07-06 NOTE — Telephone Encounter (Signed)
No, spoke to lab. They don't think they received an order so it was not run. Ottis Stain, CMA

## 2019-07-07 ENCOUNTER — Ambulatory Visit: Payer: 59 | Admitting: Family Medicine

## 2019-07-07 LAB — CERVICOVAGINAL ANCILLARY ONLY
Chlamydia: NEGATIVE
Comment: NEGATIVE
Comment: NORMAL
Neisseria Gonorrhea: NEGATIVE

## 2019-07-07 NOTE — Telephone Encounter (Signed)
Patient LVM on nurse line requesting test results again.   Please advise how patient is to proceed.   Talbot Grumbling, RN

## 2019-07-08 ENCOUNTER — Encounter: Payer: Self-pay | Admitting: Family Medicine

## 2019-07-08 DIAGNOSIS — N898 Other specified noninflammatory disorders of vagina: Secondary | ICD-10-CM | POA: Insufficient documentation

## 2019-07-08 DIAGNOSIS — R300 Dysuria: Secondary | ICD-10-CM | POA: Insufficient documentation

## 2019-07-08 MED ORDER — METRONIDAZOLE 0.75 % VA GEL: 1.0000 | Freq: Two times a day (BID) | VAGINAL | 0 refills | Status: AC

## 2019-07-08 NOTE — Progress Notes (Signed)
    SUBJECTIVE:   CHIEF COMPLAINT / HPI:   Patient with vaginal discharge, of note she was recently seen at another office a week ago and 5 days ago was prescribed metronidazole course.  She would like to get retested as she has had a new sexual contact since that time.  She says she has had no change in her symptoms and has been taking the medication as prescribed.  She says she is safe, no is making her do anything she does not want to do.  She does have some occasional dysuria but the primary complaint is discharge.  We did discuss with the patient that given her recent finding only a week ago that we did not think that a full swab would likely be necessary to test for bacterial vaginosis she said she had a new sexual partner and insisted on a swab because she would also like to test for gonorrhea chlamydia  PERTINENT  PMH / PSH:   OBJECTIVE:   BP 102/60   Pulse 79   Ht 5\' 2"  (1.575 m)   Wt 167 lb 6.4 oz (75.9 kg)   [name] 06/23/2019   SpO2 99%   BMI 30.62 kg/m   General: Alert and in no distress GU exam: *Performed solely with CMA in the room*no externally pathologic lesions visualized, there was a moderate amount of white discharge with no bleeding, no internally visualized pathologic lesions  ASSESSMENT/PLAN:   Dysuria Urine was ordered, I been unable to determine the exact cause but it appears this was not actually ran as I have no results in the computer.  I have message the patient on epic and apologize for this and have left a urine lab order open is "future "instructed her that if she would still like to get a urine test that she can come by as a lab visit sometime this week and get that done  Discharge from the vagina Positive for BV as expected because she was positive for BV next week.  During her visit she did mention that she sometimes gets an upset stomach on the metronidazole pills so I am offering a metronidazole gel to complete her course of treatment.  Gonorrhea and  Chlamydia have been negative     Sherene Sires, [place]

## 2019-07-08 NOTE — Assessment & Plan Note (Signed)
Positive for BV as expected because she was positive for BV next week.  During her visit she did mention that she sometimes gets an upset stomach on the metronidazole pills so I am offering a metronidazole gel to complete her course of treatment.  Gonorrhea and Chlamydia have been negative

## 2019-07-08 NOTE — Assessment & Plan Note (Signed)
Urine was ordered, I been unable to determine the exact cause but it appears this was not actually ran as I have no results in the computer.  I have message the patient on epic and apologize for this and have left a urine lab order open is "future "instructed her that if she would still like to get a urine test that she can come by as a lab visit sometime this week and get that done

## 2019-07-20 NOTE — Telephone Encounter (Signed)
See other message/note. Pt did not leave a urine sample and will be coming in to get this done. Ottis Stain, CMA

## 2019-08-03 ENCOUNTER — Encounter: Payer: Self-pay | Admitting: Obstetrics & Gynecology

## 2019-08-03 ENCOUNTER — Other Ambulatory Visit: Payer: Self-pay

## 2019-08-03 ENCOUNTER — Ambulatory Visit (INDEPENDENT_AMBULATORY_CARE_PROVIDER_SITE_OTHER): Payer: 59 | Admitting: Obstetrics & Gynecology

## 2019-08-03 VITALS — BP 116/72 | HR 72 | Ht 62.0 in | Wt 166.3 lb

## 2019-08-03 DIAGNOSIS — N809 Endometriosis, unspecified: Secondary | ICD-10-CM | POA: Diagnosis not present

## 2019-08-03 DIAGNOSIS — Q5128 Other doubling of uterus, other specified: Secondary | ICD-10-CM | POA: Diagnosis not present

## 2019-08-03 DIAGNOSIS — N979 Female infertility, unspecified: Secondary | ICD-10-CM

## 2019-08-03 DIAGNOSIS — N926 Irregular menstruation, unspecified: Secondary | ICD-10-CM | POA: Diagnosis not present

## 2019-08-03 NOTE — Progress Notes (Signed)
Subjective:     [name] is a 39 y.o. female who presents to the clinic 9 weeks status post laparoscopy for infertility, pelvic pain and hydrosalpinx. Eating a regular diet without difficulty. Bowel movements are normal. Pain is controlled without any medications. RUQ pain noted. She did not take OCP as prescribed and would like to have referral for definitive surgical management. She does not wish to preserve her fertility and would consider total hysterectomy, LSO The following portions of the patient's history were reviewed and updated as appropriate: allergies, current medications, past family history, past medical history, past social history, past surgical history and problem list.  Review of Systems Pertinent items are noted in HPI.    Objective:    BP 116/72   Pulse 72   Ht 5\' 2"  (1.575 m)   Wt 166 lb 4.8 oz (75.4 kg)   [name] 07/20/2019 (Exact Date)   BMI 30.42 kg/m  General:  alert, cooperative and no distress  Abdomen: Not distended  Incision:   healing well, no drainage, no erythema, no hernia, no seroma, no swelling, no dehiscence, incision well approximated     Assessment:    Doing well postoperatively. Operative findings again reviewed. Pathology report discussed.    Plan:    1. Continue any current medications. 2. Wound care discussed. 3. Activity restrictions: none 4. Anticipated return to work: now. 5. After discussing referral for infertility she stated she is interested in hysterectomy and LSO. I referred her to Dr. Ihor Dow for consultation for possible RATH LSO due to pelvic adhesions.   Woodroe Mode, MD 08/03/2019

## 2019-08-03 NOTE — Patient Instructions (Signed)
Endometriosis  Endometriosis is a condition in which the tissue that lines the uterus (endometrium) grows outside of its normal location. The tissue may grow in many locations close to the uterus, but it commonly grows on the ovaries, fallopian tubes, vagina, or bowel. When the uterus sheds the endometrium every menstrual cycle, there is bleeding wherever the endometrial tissue is located. This can cause pain because blood is irritating to tissues that are not normally exposed to it. What are the causes? The cause of endometriosis is not known. What increases the risk? You may be more likely to develop endometriosis if you:  Have a family history of endometriosis.  Have never given birth.  Started your period at age 10 or younger.  Have high levels of estrogen in your body.  Were exposed to a certain medicine (diethylstilbestrol) before you were born (in utero).  Had low birth weight.  Were born as a twin, triplet, or other multiple.  Have a BMI of less than 25. BMI is an estimate of body fat and is calculated from height and weight. What are the signs or symptoms? Often, there are no symptoms of this condition. If you do have symptoms, they may:  Vary depending on where your endometrial tissue is growing.  Occur during your menstrual period (most common) or midcycle.  Come and go, or you may go months with no symptoms at all.  Stop with menopause. Symptoms may include:  Pain in the back or abdomen.  Heavier bleeding during periods.  Pain during sex.  Painful bowel movements.  Infertility.  Pelvic pain.  Bleeding more than once a month. How is this diagnosed? This condition is diagnosed based on your symptoms and a physical exam. You may have tests, such as:  Blood tests and urine tests. These may be done to help rule out other possible causes of your symptoms.  Ultrasound, to look for abnormal tissues.  An X-ray of the lower bowel (barium enema).  An  ultrasound that is done through the vagina (transvaginally).  CT scan.  MRI.  Laparoscopy. In this procedure, a lighted, pencil-sized instrument called a laparoscope is inserted into your abdomen through an incision. The laparoscope allows your health care provider to look at the organs inside your body and check for abnormal tissue to confirm the diagnosis. If abnormal tissue is found, your health care provider may remove a small piece of tissue (biopsy) to be examined under a microscope. How is this treated? Treatment for this condition may include:  Medicines to relieve pain, such as NSAIDs.  Hormone therapy. This involves using artificial (synthetic) hormones to reduce endometrial tissue growth. Your health care provider may recommend using a hormonal form of birth control, or other medicines.  Surgery. This may be done to remove abnormal endometrial tissue. ? In some cases, tissue may be removed using a laparoscope and a laser (laparoscopic laser treatment). ? In severe cases, surgery may be done to remove the fallopian tubes, uterus, and ovaries (hysterectomy). Follow these instructions at home:  Take over-the-counter and prescription medicines only as told by your health care provider.  Do not drive or use heavy machinery while taking prescription pain medicine.  Try to avoid activities that cause pain, including sexual activity.  Keep all follow-up visits as told by your health care provider. This is important. Contact a health care provider if:  You have pain in the area between your hip bones (pelvic area) that occurs: ? Before, during, or after your period. ?   In between your period and gets worse during your period. ? During or after sex. ? With bowel movements or urination, especially during your period.  You have problems getting pregnant.  You have a fever. Get help right away if:  You have severe pain that does not get better with medicine.  You have severe  nausea and vomiting, or you cannot eat without vomiting.  You have pain that affects only the lower, right side of your abdomen.  You have abdominal pain that gets worse.  You have abdominal swelling.  You have blood in your stool. This information is not intended to replace advice given to you by your health care provider. Make sure you discuss any questions you have with your health care provider. Document Revised: 01/08/2017 Document Reviewed: 06/29/2015 Elsevier Patient Education  2020 Elsevier Inc.  

## 2019-08-16 ENCOUNTER — Telehealth: Payer: Self-pay | Admitting: Student in an Organized Health Care Education/Training Program

## 2019-08-16 NOTE — Telephone Encounter (Signed)
Patient is calling and would like to know if she could have a new prescription. The last cream she was using has given her a yeast infection.   Please call pt with any questions. [phone]

## 2019-08-16 NOTE — Telephone Encounter (Signed)
Patient LVM on nurse line checking status of receiving rx.   To PCP  Talbot Grumbling, RN

## 2019-08-18 NOTE — Telephone Encounter (Signed)
Patient scheduled for Monday AM 7/12 at 0830.   Talbot Grumbling, RN

## 2019-08-18 NOTE — Telephone Encounter (Signed)
She would need an appointment with our clinic or her ob/gyn clinic for exam

## 2019-08-21 ENCOUNTER — Ambulatory Visit: Payer: 59

## 2019-08-21 ENCOUNTER — Ambulatory Visit: Payer: 59 | Admitting: Family Medicine

## 2019-08-21 ENCOUNTER — Other Ambulatory Visit (HOSPITAL_COMMUNITY)
Admission: RE | Admit: 2019-08-21 | Discharge: 2019-08-21 | Disposition: A | Discharge status: Home / Self Care | Payer: 59 | Source: Ambulatory Visit | Attending: Family Medicine | Admitting: Family Medicine

## 2019-08-21 ENCOUNTER — Encounter: Payer: Self-pay | Admitting: Student in an Organized Health Care Education/Training Program

## 2019-08-21 ENCOUNTER — Other Ambulatory Visit: Payer: Self-pay

## 2019-08-21 VITALS — BP 101/80 | HR 88 | Ht 62.0 in | Wt 165.2 lb

## 2019-08-21 DIAGNOSIS — Z113 Encounter for screening for infections with a predominantly sexual mode of transmission: Secondary | ICD-10-CM

## 2019-08-21 DIAGNOSIS — L0291 Cutaneous abscess, unspecified: Secondary | ICD-10-CM | POA: Diagnosis not present

## 2019-08-21 DIAGNOSIS — N898 Other specified noninflammatory disorders of vagina: Secondary | ICD-10-CM | POA: Diagnosis not present

## 2019-08-21 LAB — POCT WET PREP (WET MOUNT)
Clue Cells Wet Prep Whiff POC: POSITIVE
Trichomonas Wet Prep HPF POC: ABSENT

## 2019-08-21 MED ORDER — FLUCONAZOLE 150 MG PO TABS
150.0000 mg | ORAL_TABLET | Freq: Once | ORAL | 0 refills | Status: AC
Start: 2019-08-21 — End: 2019-08-21

## 2019-08-21 MED ORDER — CLINDAMYCIN HCL 300 MG PO CAPS: 300.0000 mg | ORAL_CAPSULE | Freq: Two times a day (BID) | ORAL | 0 refills | Status: AC

## 2019-08-21 NOTE — Progress Notes (Signed)
    SUBJECTIVE:   CHIEF COMPLAINT / HPI:   Vaginal discharge and STD check Patient is a very pleasant 39 year old female the presents today for concern for yeast infection.  Patient states she has had some tenderness and discomfort to the area of her vagina with a thick whitish discharge for the past 4 days.  She denies any new sexual partners but states she would like to be checked for STDs.  Abscess of mons pubis Patient states she has had an area of swelling and tenderness with some drainage for several months which will swell, rupture and drain with pus, and then swell again.  Patient denies injury to the area and states that she had used some antibiotic for in the past which did not seem to help.  She states that as it has become more swollen lately has become tender and is causing her difficulty because of the discomfort.  PERTINENT  PMH / PSH: Recently diagnosed with BV 2 months ago  Procedure note: Procedure discussed with Dr. Erin Hearing prior to proceeding.  Patient was identified by name and date of birth and was provided informed consent prior to the procedure.  Procedure was chaperoned by RN for the duration of the procedure.  Area was cleaned with alcohol swab prior to injection of lidocaine. Approximately 3 cc of lidocaine were used numbing circumferential pattern around the lesion.  After a brief pause to allow for numbing, Two 0.7 cm incisions were made with copious amounts of white pus draining.  Pressure was applied to the abscess until all the pus had been drained.  Area was cleaned with 4 x 4 bandage prior to adding a small bandage over the incisions with no further bleeding noted at that time.  OBJECTIVE:   BP 101/80   Pulse 88   Ht 5\' 2"  (1.575 m)   Wt 165 lb 3.2 oz (74.9 kg)   [name] 08/17/2019 (Exact Date)   SpO2 100%   BMI 30.22 kg/m    General: NAD, pleasant, able to participate in exam Respiratory: No respiratory distress Pelvic: Pelvic exam: normal external  genitalia, vulva, vagina, cervix, uterus and adnexa, VULVA: normal appearing vulva with no masses, tenderness or lesions, VAGINA: normal appearing vagina with normal color and discharge, no lesions, vaginal tenderness present, vaginal discharge - white and copious.  Chaperoned by RN Skin: Patient with a 2 cm abscess with tenderness and induration located on the left mons pubis which appears to have previously been draining but currently without drainage. Psych: Normal affect and mood  ASSESSMENT/PLAN:   Abscess Assessment: 2-3 cm diameter abscess located in the left mons pubis.  Patient states this has been coming and going for several months now.  Because this has been coming and going for several months there is concerned this could be a sebaceous cyst in which case an I&D may not be sufficient in resolving this issue. Plan: -I&D performed, see procedure note  -Patient placed on clindamycin for antibiotic coverage with return precautions provided -Should abscess return can consider more thorough I&D in which case sutures will most likely be needed  Discharge from the vagina Assessment: Wet prep evidence of bacterial vaginosis.  GC/chlamydia/syphilis/HIV pending Plan: -Provided clindamycin 7 days 300 mg twice daily for coverage of bacterial vaginosis - GC/chlamydia/syphilis/HIV pending -Provided patient with single dose of Diflucan as she often gets yeast infections after being treated for [place], [place]

## 2019-08-21 NOTE — Assessment & Plan Note (Signed)
Assessment: 2-3 cm diameter abscess located in the left mons pubis.  Patient states this has been coming and going for several months now.  Because this has been coming and going for several months there is concerned this could be a sebaceous cyst in which case an I&D may not be sufficient in resolving this issue. Plan: -I&D performed, see procedure note  -Patient placed on clindamycin for antibiotic coverage with return precautions provided -Should abscess return can consider more thorough I&D in which case sutures will most likely be needed

## 2019-08-21 NOTE — Assessment & Plan Note (Signed)
Assessment: Wet prep evidence of bacterial vaginosis.  GC/chlamydia/syphilis/HIV pending Plan: -Provided clindamycin 7 days 300 mg twice daily for coverage of bacterial vaginosis - GC/chlamydia/syphilis/HIV pending -Provided patient with single dose of Diflucan as she often gets yeast infections after being treated for BV

## 2019-08-21 NOTE — Patient Instructions (Addendum)
It was great to see you!  Our plans for today:  -Today we performed a wet prep to look for any signs of a vaginal yeast infection.  Wet prep showed BV.  I am sending an antibiotic called clindamycin to your pharmacy for 7 days. -We also performed a small incision and drainage of the abscess that you had.  I am prescribing an antibiotic that will cover both your BV and the infection from the abscess.  We drained a lot of pus but there is a chance this abscess may come back as it come back in the past.  If that is the case we may do to do a more extensive incision and drainage with stitches afterwards. -You can use Tylenol for pain with the abscess.  It may continue to drain a small amount but should heal up over the next few days.  If it becomes inflamed or starts to look infected please return.  We are checking some labs today, I will call you if they are abnormal will send you a MyChart message or a letter if they are normal.  If you do not hear about your labs in the next 2 weeks please let us know.  Take care and seek immediate care sooner if you develop any concerns.   Dr. [facility]

## 2019-08-22 ENCOUNTER — Telehealth: Payer: Self-pay | Admitting: Student in an Organized Health Care Education/Training Program

## 2019-08-22 LAB — RPR: RPR Ser Ql: NONREACTIVE

## 2019-08-22 LAB — HIV ANTIBODY (ROUTINE TESTING W REFLEX): HIV Screen 4th Generation wRfx: NONREACTIVE

## 2019-08-22 LAB — CERVICOVAGINAL ANCILLARY ONLY
Chlamydia: NEGATIVE
Comment: NEGATIVE
Comment: NORMAL
Neisseria Gonorrhea: NEGATIVE

## 2019-08-22 NOTE — Telephone Encounter (Signed)
Patient is calling and would like to know if Dr. Ouida Sills will place a lab order for her to have a urine test. She believes she has a UTI. She states that the last two times she was seen in the office for this concern she never had her urine tested.   She said her first visit the doctor called and asked her to come back for urine test but she did not return.   Patient would like for someone to call her if order is placed. The best call back number is [phone].

## 2019-09-13 ENCOUNTER — Other Ambulatory Visit: Payer: Self-pay

## 2019-09-13 ENCOUNTER — Ambulatory Visit (INDEPENDENT_AMBULATORY_CARE_PROVIDER_SITE_OTHER): Payer: 59 | Admitting: Obstetrics & Gynecology

## 2019-09-13 ENCOUNTER — Encounter: Payer: Self-pay | Admitting: Obstetrics & Gynecology

## 2019-09-13 VITALS — BP 98/66 | HR 68 | Temp 98.7°F | Ht 62.0 in | Wt 166.0 lb

## 2019-09-13 DIAGNOSIS — L732 Hidradenitis suppurativa: Secondary | ICD-10-CM

## 2019-09-13 DIAGNOSIS — R102 Pelvic and perineal pain: Secondary | ICD-10-CM | POA: Diagnosis not present

## 2019-09-13 DIAGNOSIS — N7011 Chronic salpingitis: Secondary | ICD-10-CM

## 2019-09-13 MED ORDER — CLINDAMYCIN PHOSPHATE 1 % EX GEL: Freq: Two times a day (BID) | CUTANEOUS | 11 refills | Status: DC

## 2019-09-13 NOTE — Progress Notes (Signed)
Patient education: Hidradenitis suppurativa (The Basics) from Up to date printed and given to patient. Kathrene Alu RN

## 2019-09-13 NOTE — Patient Instructions (Signed)
GO WHITE: Soap: UNSCENTED Dove (white box light green writing) Laundry detergent (underwear)- Dreft or [employer] unscented WHITE 100% cotton panties (NOT just cotton crouch) Sanitary napkin/panty liners: UNSCENTED.  If it doesn't SAY unscented it can have a scent/perfume    NO PERFUMES OR LOTIONS OR POTIONS in the vulvar area (may use regular [place]) Condoms: hypoallergenic only. Non dyed (no color) Toilet papers: white only Wash clothes: use a separate wash cloth. WHITE.  Washed in Dreft.  

## 2019-09-13 NOTE — Progress Notes (Signed)
History:  39 y.o. G0P0 here today for consult for hyst from Dr. Roselie Awkward. Pt is here with her mother she reports that she has a procedure to eval a finding and was noted to have a 'bubble' in her pelvis. She reports that she was referred for furhter surgery as the surgery was too risky at the time due to involvement of the bowel and the risk of ending up with a colostomy. Pt reports that she is not having any sx. She reports that she was having pain but, it has resolved. Pt does reports on occ pain in the pelvis (not in the same location) that resolved without meds.     The following portions of the patient's history were reviewed and updated as appropriate: allergies, current medications, past family history, past medical history, past social history, past surgical history and problem list.  Review of Systems:  Pertinent items are noted in HPI.    Objective:  Physical Exam Blood pressure 98/66, pulse 68, temperature 98.7 F (37.1 C), height 5\' 2"  (1.575 m), weight 166 lb (75.3 kg), last menstrual period 09/07/2019.  CONSTITUTIONAL: Well-developed, well-nourished female in no acute distress.  HEENT:  Normocephalic, atraumatic; her ears are WNL. No s/sx if infxn. Marland Kitchen  EYES: Conjunctivae and EOM are normal. No scleral icterus.  NECK: Normal range of motion; No LA noted.  SKIN: Skin is warm and dry. No rash noted. Not diaphoretic.No pallor. [place]: Alert and oriented to person, place, and time. Normal coordination.  Abd: Soft, nontender and nondistended Pelvic: Normal appearing external genitalia; normal appearing vaginal mucosa and cervix.  Normal discharge.  Small uterus, no other palpable masses, no uterine or adnexal tenderness  Labs and Imaging 04/11/2019 EXAM: MRI PELVIS WITHOUT AND WITH CONTRAST  TECHNIQUE: Multiplanar multisequence MR imaging of the pelvis was performed both before and after administration of intravenous contrast.  CONTRAST:  51mL GADAVIST GADOBUTROL 1 MMOL/ML IV  SOLN  COMPARISON:  None.  FINDINGS: Lower Urinary Tract: No urinary bladder or urethral abnormality identified.  Bowel: Unremarkable appearance of rectum and other pelvic bowel loops.  Vascular/Lymphatic: Unremarkable. No pathologically enlarged pelvic lymph nodes identified.  Reproductive:  -- Uterus: Measures 9.7 x 4.4 x 6.6 cm (volume = 150 cm^3). Two submucosal fibroids are seen in the anterior corpus and fundus, which measure 1.8 cm and 2.8 cm in diameter respectively. An 8 mm intramural fibroid is seen in the posterior corpus. A thin myometrial septum is visualized within the endometrial cavity which extends into the cervix, however there is no evidence of a fundal myometrial cleft. This consistent with a septate uterus.  Cervix is normal appearance. A 1.3 cm Gartner duct cyst is seen in the superior vaginal wall, and a 8 mm Bartholin's gland cyst is seen in the inferior vaginal wall.  -- Right ovary: No right ovary visualized. A tubular appearing cystic lesion is seen in the right adnexa which measures 8.7 x 4.2 cm. This shows no evidence of internal fat, mural nodularity, or thickened septations, and is most consistent with a large hydrosalpinx.  -- Left ovary: Appears normal. No ovarian or adnexal masses identified.  Other: Tiny amount of simple free fluid noted.  Musculoskeletal:  Unremarkable.  IMPRESSION: 1. Septate uterus, with several small fibroids measuring up to 2.8 cm. 2. 8.7 cm tubular shaped cystic lesion in right adnexa, consistent with hydrosalpinx. 3. Small vaginal Gartner's duct and Bartholin's gland cysts.  Assessment & Plan:  Ear pressure/pain- her ear exam is WNL  Pelvic pain mild occ-  does not warrant meds and is not on the location of the hydrosalpinx on the MRI  After review of the MRI and the OP note, I don not feel that surgery is warranted at this time. Pt has no sx and the amount of  adhesions increases her risk of  injury. I have discussed with her and her mother the risks vs the benefits and they have elected to  follow the cyst with imaging to confirm stability.     Pelvic US in 3 months.     Hydradenitis- rec Clindamycin gel bid.  Recurrent BV- reviewed modes of tx. Pt does not have sx at present. Pt given Go WHITE info.   Rec f/u in 3 month or sooner prn  Total face-to-face time with patient was 35 min.  Greater than 50% was spent in counseling and coordination of care with the patient.   Sam Overbeck L. Harraway-Smith, M.D., Cherlynn June

## 2019-09-14 ENCOUNTER — Encounter (HOSPITAL_COMMUNITY): Payer: Self-pay | Admitting: Emergency Medicine

## 2019-09-14 ENCOUNTER — Ambulatory Visit: Payer: 59 | Admitting: Family Medicine

## 2019-09-14 ENCOUNTER — Ambulatory Visit (HOSPITAL_COMMUNITY)
Admission: EM | Admit: 2019-09-14 | Discharge: 2019-09-14 | Disposition: A | Discharge status: Home / Self Care | Payer: 59 | Attending: Internal Medicine | Admitting: Internal Medicine

## 2019-09-14 DIAGNOSIS — Z793 Long term (current) use of hormonal contraceptives: Secondary | ICD-10-CM | POA: Diagnosis not present

## 2019-09-14 DIAGNOSIS — Z113 Encounter for screening for infections with a predominantly sexual mode of transmission: Secondary | ICD-10-CM | POA: Diagnosis not present

## 2019-09-14 DIAGNOSIS — N898 Other specified noninflammatory disorders of vagina: Secondary | ICD-10-CM | POA: Insufficient documentation

## 2019-09-14 DIAGNOSIS — J069 Acute upper respiratory infection, unspecified: Secondary | ICD-10-CM | POA: Diagnosis not present

## 2019-09-14 DIAGNOSIS — Z20822 Contact with and (suspected) exposure to covid-19: Secondary | ICD-10-CM | POA: Diagnosis not present

## 2019-09-14 DIAGNOSIS — K219 Gastro-esophageal reflux disease without esophagitis: Secondary | ICD-10-CM | POA: Diagnosis not present

## 2019-09-14 DIAGNOSIS — H6503 Acute serous otitis media, bilateral: Secondary | ICD-10-CM | POA: Diagnosis present

## 2019-09-14 DIAGNOSIS — J029 Acute pharyngitis, unspecified: Secondary | ICD-10-CM | POA: Diagnosis not present

## 2019-09-14 DIAGNOSIS — H9203 Otalgia, bilateral: Secondary | ICD-10-CM

## 2019-09-14 DIAGNOSIS — F17210 Nicotine dependence, cigarettes, uncomplicated: Secondary | ICD-10-CM | POA: Insufficient documentation

## 2019-09-14 DIAGNOSIS — Z79899 Other long term (current) drug therapy: Secondary | ICD-10-CM | POA: Diagnosis not present

## 2019-09-14 DIAGNOSIS — Z8616 Personal history of COVID-19: Secondary | ICD-10-CM | POA: Insufficient documentation

## 2019-09-14 LAB — POCT RAPID STREP A, ED / UC: Streptococcus, Group A Screen (Direct): NEGATIVE

## 2019-09-14 LAB — SARS CORONAVIRUS 2 (TAT 6-24 HRS): SARS Coronavirus 2: NEGATIVE

## 2019-09-14 MED ORDER — CETIRIZINE HCL 10 MG PO TABS
10.0000 mg | ORAL_TABLET | Freq: Every day | ORAL | 0 refills | Status: DC
Start: 2019-09-14 — End: 2019-10-02

## 2019-09-14 MED ORDER — FLUTICASONE PROPIONATE 50 MCG/ACT NA SUSP
1.0000 | Freq: Every day | NASAL | 2 refills | Status: DC
Start: 2019-09-14 — End: 2019-12-25

## 2019-09-14 MED ORDER — CEPACOL SORE THROAT 5.4 MG MT LOZG
1.0000 | LOZENGE | OROMUCOSAL | 0 refills | Status: DC | PRN
Start: 2019-09-14 — End: 2019-12-25

## 2019-09-14 NOTE — ED Triage Notes (Addendum)
Pt c/o bilateral ear pain x 3 days. Pain is worse when she swallows, The left ear is worse. Pt states she has been taking Ibuprofen with no relief. Pt is requesting covid test.

## 2019-09-14 NOTE — ED Provider Notes (Addendum)
[place]    CSN: [id number] Arrival date & time: 09/14/19  0806      History   Chief Complaint Chief Complaint  Patient presents with  . Otalgia    HPI [name] is a 39 y.o. female.   Patient presents for evaluation of bilateral ear pain.  She reports is been going on for the last 3 to 4 days.  She reports initially having a little bit of a sore throat.  She reports her left ear hurts worse than her right.  She reports sore throat is improved with warm teas.  She has been trying over-the-counter medicines for her ears without improvement.  She does endorse some nasal congestion over the same timeframe.  Denies cough, fever, chills, nausea, vomiting, domino pain.  Received both Covid vaccines.  Patient also requests STI testing both vaginally and in her throat.  No known exposures however sexually active.  She also reports a slight white vaginal discharge.  Denies vaginal pain or irritation, painful urination frequency urgency.     Past Medical History:  Diagnosis Date  . Endometriosis   . History of 2019 novel coronavirus disease (COVID-19)    pt tested positive 03-03-2019, results in care everywhere.  (05-22-2019 per pt had symptoms of fatigue, headache, and loss of taste/ smell;  fatigue and headache after one week and loss taste/ smell resolved after 3 wks)  . Infertility, female   . Ovarian cyst   . Pelvic mass in female    large fluid filled pelvic mass  . Recurrent boils   . Septate uterus     Patient Active Problem List   Diagnosis Date Noted  . Abscess 08/21/2019  . Discharge from the vagina 07/08/2019  . Dysuria 07/08/2019  . GERD (gastroesophageal reflux disease) 06/07/2019  . HSV-2 seropositive 03/07/2019  . Hydrosalpinx 12/30/2018  . Screening examination for STD (sexually transmitted disease) 07/09/2017  . Breast mass in female 01/22/2013  . BV (bacterial vaginosis) 01/18/2012  . Endometriosis 05/01/2011  . SEPTATE UTERUS 07/24/2009    . TOBACCO ABUSE 09/26/2008  . Female infertility 09/26/2008  . IRREGULAR MENSTRUATION 09/21/2006    Past Surgical History:  Procedure Laterality Date  . BREAST BIOPSY Right 1999   per pt benign   . LAPAROSCOPIC SALPINGOOPHERECTOMY Right 03-25-2010   @[facility]    and LYSIS ADHESIONS  . LAPAROSCOPY N/A 05/30/2019   Procedure: DIAGNOSTIC LAPAROSCOPY;  Surgeon: Woodroe Mode, MD;  Location: [facility];  Service: Gynecology;  Laterality: N/A;    OB History    Gravida  0   Para      Term      Preterm      AB      Living  0     SAB      TAB      Ectopic      Multiple      Live Births               Home Medications    Prior to Admission medications   Medication Sig Start Date End Date Taking? Authorizing Provider  acetic acid 2 % otic solution Place 4 drops into the left ear 3 (three) times daily. Patient not taking: Reported on 08/03/2019 06/15/19   Scot Jun, FNP  Ca Carbonate-Mag Hydroxide (ROLAIDS PO) Take by mouth as needed.    [provider]  cetirizine (ZYRTEC ALLERGY) 10 MG tablet Take 1 tablet (10 mg total) by mouth daily. 09/14/19  Savon Bordonaro, Marguerita Beards, PA-C  clindamycin (CLINDAGEL) 1 % gel Apply topically 2 (two) times daily. 09/13/19   Lavonia Drafts, MD  ELDERBERRY PO Take by mouth.    [provider]  fluticasone (FLONASE) 50 MCG/ACT nasal spray Place 1 spray into both nostrils daily. 09/14/19   Verna Desrocher, Marguerita Beards, PA-C  Menthol (CEPACOL SORE THROAT) 5.4 MG LOZG Use as directed 1 lozenge (5.4 mg total) in the mouth or throat every 2 (two) hours as needed. 09/14/19   Melisha Eggleton, Marguerita Beards, PA-C  Norethindrone Acetate-Ethinyl Estrad-FE (LOESTRIN 24 FE) 1-20 MG-MCG(24) tablet Take 1 tablet by mouth daily. Patient not taking: Reported on 08/03/2019 06/16/19   Donnamae Jude, MD  oxyCODONE-acetaminophen (PERCOCET/ROXICET) 5-325 MG tablet Take 1-2 tablets by mouth every 6 (six) hours as needed. Patient not taking: Reported on 06/16/2019  05/30/19   Woodroe Mode, MD  pantoprazole (PROTONIX) 20 MG tablet Take 20 mg by mouth daily. Patient not taking: Reported on 08/03/2019    [provider]  Probiotic Product (CULTURELLE PROBIOTICS PO) Take by mouth.    [provider]  Norgestimate-Ethinyl Estradiol Triphasic (TRI-SPRINTEC) 0.18/0.215/0.25 MG-35 MCG tablet Take 1 tablet by mouth daily. 11/03/10 05/01/11  Woodroe Mode, MD    Family History Family History  Problem Relation Age of Onset  . Hypertension Mother   . Diabetes Mother   . Cancer Father   . Hypertension Father   . Diabetes Brother   . Hypertension Brother   . Breast cancer Neg Hx     Social History Social History   Tobacco Use  . Smoking status: Current Every Day Smoker    Packs/day: 0.25    Years: 15.00    Pack years: 3.75    Types: Cigarettes  . Smokeless tobacco: Never Used  Vaping Use  . Vaping Use: Never used  Substance Use Topics  . Alcohol use: Yes    Comment: occasionally  . Drug use: Never     Allergies   Sulfa antibiotics   Review of Systems Review of Systems   Physical Exam Triage Vital Signs ED Triage Vitals [09/14/19 0813]  Enc Vitals Group     BP      Pulse      Resp      Temp      Temp src      SpO2      Weight      Height      Head Circumference      Peak Flow      Pain Score 4     Pain Loc      Pain Edu?      Excl. in [place]?    No data found.  Updated Vital Signs BP 103/74 (BP Location: Left Arm)   Pulse (!) 103   Temp 98.3 F (36.8 C) (Oral)   Resp 16   [name] 09/07/2019   SpO2 100%   Visual Acuity Right Eye Distance:   Left Eye Distance:   Bilateral Distance:    Right Eye Near:   Left Eye Near:    Bilateral Near:     Physical Exam Vitals and nursing note reviewed.  Constitutional:      General: She is not in acute distress.    Appearance: She is well-developed.  HENT:     Head: Normocephalic and atraumatic.     Ears:     Comments: Tympanic membranes are gray without  erythema or bulging.  There is bilateral serous effusions  Nose: Rhinorrhea present.     Comments: Mild congestion    Mouth/Throat:     Pharynx: Posterior oropharyngeal erythema present.     Comments: Postnasal drip visible Eyes:     Conjunctiva/sclera: Conjunctivae normal.  Cardiovascular:     Rate and Rhythm: Normal rate and regular rhythm.     Heart sounds: No murmur heard.   Pulmonary:     Effort: Pulmonary effort is normal. No respiratory distress.     Breath sounds: Normal breath sounds. No wheezing, rhonchi or rales.  Musculoskeletal:     Cervical back: Neck supple.  Skin:    General: Skin is warm and dry.  Neurological:     Mental Status: She is alert.      UC Treatments / Results  Labs (all labs ordered are listed, but only abnormal results are displayed) Labs Reviewed  SARS CORONAVIRUS 2 (TAT 6-24 HRS)  CULTURE, GROUP A STREP [facility])  POCT RAPID STREP A, ED / UC  CYTOLOGY, (ORAL, ANAL, URETHRAL) ANCILLARY ONLY  CERVICOVAGINAL ANCILLARY ONLY    EKG   Radiology No results found.  Procedures Procedures (including critical care time)  Medications Ordered in UC Medications - No data to display  Initial Impression / Assessment and Plan / UC Course  I have reviewed the triage vital signs and the nursing notes.  Pertinent labs & imaging results that were available during my care of the patient were reviewed by me and considered in my medical decision making (see chart for details).     #Viral URI #Bilateral serous otitis #Screen for STD #Vaginal discharge Patient is a 39 year old presenting with the above symptoms and concerns.  Rapid strep negative.  Vaginal and throat swab sent screen for STI.  Throat culture sent.  Covid sent.  Will wait on results to treatment of vaginal discharge.  Symptomatic treatment of upper respiratory symptoms.  Return and follow-up precautions discussed.  Patient verbalized understanding. Final Clinical Impressions(s) / UC  Diagnoses   Final diagnoses:  Viral upper respiratory tract infection  Bilateral acute serous otitis media, recurrence not specified  Screen for STD (sexually transmitted disease)  Vaginal discharge     Discharge Instructions     Take the medications as prescribed  We have sent your tests,we will call if changes are required  If your Covid-19 test is positive, you will receive a phone call from [facility] regarding your results. Negative test results are not called. Both positive and negative results area always visible on MyChart. If you do not have a MyChart account, sign up instructions are in your discharge papers.   Persons who are directed to care for themselves at home may discontinue isolation under the following conditions:  . At least 10 days have passed since symptom onset and . At least 24 hours have passed without running a fever (this means without the use of fever-reducing medications) and . Other symptoms have improved.  Persons infected with COVID-19 who never develop symptoms may discontinue isolation and other precautions 10 days after the date of their first positive COVID-19 test.       ED Prescriptions    Medication Sig Dispense Auth. Provider   fluticasone (FLONASE) 50 MCG/ACT nasal spray Place 1 spray into both nostrils daily. 15.8 mL [name], Marguerita Beards, PA-C   cetirizine (ZYRTEC ALLERGY) 10 MG tablet Take 1 tablet (10 mg total) by mouth daily. 30 tablet [name], Marguerita Beards, PA-C   Menthol (CEPACOL SORE THROAT) 5.4 MG LOZG Use as directed 1  lozenge (5.4 mg total) in the mouth or throat every 2 (two) hours as needed. 30 lozenge [name], Marguerita Beards, PA-C     PDMP not reviewed this encounter.   Purnell Shoemaker, PA-C 09/14/19 0938    [name], Marguerita Beards, PA-C 09/14/19 [phone]

## 2019-09-14 NOTE — Discharge Instructions (Signed)
Take the medications as prescribed  We have sent your tests,we will call if changes are required  If your Covid-19 test is positive, you will receive a phone call from [facility] regarding your results. Negative test results are not called. Both positive and negative results area always visible on MyChart. If you do not have a MyChart account, sign up instructions are in your discharge papers.   Persons who are directed to care for themselves at home may discontinue isolation under the following conditions:   At least 10 days have passed since symptom onset and  At least 24 hours have passed without running a fever (this means without the use of fever-reducing medications) and  Other symptoms have improved.  Persons infected with COVID-19 who never develop symptoms may discontinue isolation and other precautions 10 days after the date of their first positive COVID-19 test.

## 2019-09-15 ENCOUNTER — Telehealth (HOSPITAL_COMMUNITY): Payer: Self-pay

## 2019-09-15 LAB — CERVICOVAGINAL ANCILLARY ONLY
Bacterial Vaginitis (gardnerella): POSITIVE — AB
Candida Glabrata: NEGATIVE
Candida Vaginitis: NEGATIVE
Chlamydia: NEGATIVE
Comment: NEGATIVE
Comment: NEGATIVE
Comment: NEGATIVE
Comment: NEGATIVE
Comment: NEGATIVE
Comment: NORMAL
Neisseria Gonorrhea: NEGATIVE
Trichomonas: NEGATIVE

## 2019-09-15 LAB — CYTOLOGY, (ORAL, ANAL, URETHRAL) ANCILLARY ONLY
Chlamydia: NEGATIVE
Comment: NEGATIVE
Comment: NORMAL
Neisseria Gonorrhea: NEGATIVE

## 2019-09-15 MED ORDER — METRONIDAZOLE 500 MG PO TABS: 500.0000 mg | ORAL_TABLET | Freq: Two times a day (BID) | ORAL | 0 refills | Status: DC

## 2019-09-17 LAB — CULTURE, GROUP A STREP (THRC)

## 2019-09-18 ENCOUNTER — Encounter: Payer: Self-pay | Admitting: Obstetrics & Gynecology

## 2019-09-20 NOTE — Telephone Encounter (Signed)
Attempted to call patient back regarding rx, no answer.

## 2019-09-21 ENCOUNTER — Ambulatory Visit (HOSPITAL_COMMUNITY): Payer: 59

## 2019-09-21 ENCOUNTER — Telehealth (HOSPITAL_COMMUNITY): Payer: Self-pay | Admitting: Emergency Medicine

## 2019-09-21 MED ORDER — METRONIDAZOLE 0.75 % VA GEL: 1.0000 | Freq: Every day | VAGINAL | 0 refills | Status: AC

## 2019-09-21 NOTE — Telephone Encounter (Signed)
Patient called stating she would like prescription from [facility] to treat BV rather than Flagyl.  Prescription sent to pharmacy of request.

## 2019-09-25 ENCOUNTER — Ambulatory Visit (HOSPITAL_COMMUNITY)
Admission: RE | Admit: 2019-09-25 | Discharge: 2019-09-25 | Disposition: A | Discharge status: Home / Self Care | Payer: 59 | Source: Ambulatory Visit | Attending: Obstetrics & Gynecology | Admitting: Obstetrics & Gynecology

## 2019-09-25 ENCOUNTER — Other Ambulatory Visit: Payer: Self-pay

## 2019-09-25 DIAGNOSIS — R102 Pelvic and perineal pain: Secondary | ICD-10-CM

## 2019-10-02 ENCOUNTER — Ambulatory Visit (HOSPITAL_COMMUNITY)
Admission: EM | Admit: 2019-10-02 | Discharge: 2019-10-02 | Disposition: A | Discharge status: Home / Self Care | Payer: 59 | Attending: Urgent Care | Admitting: Urgent Care

## 2019-10-02 ENCOUNTER — Encounter (HOSPITAL_COMMUNITY): Payer: Self-pay

## 2019-10-02 ENCOUNTER — Other Ambulatory Visit: Payer: Self-pay

## 2019-10-02 DIAGNOSIS — R0989 Other specified symptoms and signs involving the circulatory and respiratory systems: Secondary | ICD-10-CM | POA: Insufficient documentation

## 2019-10-02 DIAGNOSIS — Z8616 Personal history of COVID-19: Secondary | ICD-10-CM | POA: Insufficient documentation

## 2019-10-02 DIAGNOSIS — B373 Candidiasis of vulva and vagina: Secondary | ICD-10-CM | POA: Insufficient documentation

## 2019-10-02 DIAGNOSIS — N898 Other specified noninflammatory disorders of vagina: Secondary | ICD-10-CM | POA: Diagnosis not present

## 2019-10-02 DIAGNOSIS — Z79899 Other long term (current) drug therapy: Secondary | ICD-10-CM | POA: Insufficient documentation

## 2019-10-02 DIAGNOSIS — F17210 Nicotine dependence, cigarettes, uncomplicated: Secondary | ICD-10-CM | POA: Diagnosis not present

## 2019-10-02 DIAGNOSIS — Z793 Long term (current) use of hormonal contraceptives: Secondary | ICD-10-CM | POA: Diagnosis not present

## 2019-10-02 DIAGNOSIS — Z20822 Contact with and (suspected) exposure to covid-19: Secondary | ICD-10-CM | POA: Insufficient documentation

## 2019-10-02 DIAGNOSIS — B3731 Acute candidiasis of vulva and vagina: Secondary | ICD-10-CM

## 2019-10-02 DIAGNOSIS — Z3202 Encounter for pregnancy test, result negative: Secondary | ICD-10-CM

## 2019-10-02 LAB — SARS CORONAVIRUS 2 (TAT 6-24 HRS): SARS Coronavirus 2: NEGATIVE

## 2019-10-02 LAB — POC URINE PREG, ED: Preg Test, Ur: NEGATIVE

## 2019-10-02 MED ORDER — CETIRIZINE HCL 10 MG PO TABS
10.0000 mg | ORAL_TABLET | Freq: Every day | ORAL | 0 refills | Status: DC
Start: 2019-10-02 — End: 2020-05-28

## 2019-10-02 MED ORDER — FLUCONAZOLE 150 MG PO TABS
150.0000 mg | ORAL_TABLET | ORAL | 0 refills | Status: DC
Start: 2019-10-02 — End: 2019-10-27

## 2019-10-02 MED ORDER — PSEUDOEPHEDRINE HCL 30 MG PO TABS
30.0000 mg | ORAL_TABLET | Freq: Three times a day (TID) | ORAL | 0 refills | Status: DC | PRN
Start: 2019-10-02 — End: 2019-12-25

## 2019-10-02 NOTE — ED Provider Notes (Signed)
[name]   MRN: [id number] DOB: 08/01/1980  Subjective:   [name] is a 39 y.o. female presenting for 2-week history of persistent runny nose, made worse by wearing the mask.  Patient denies fever, sinus pain, throat pain, cough, chest pain, shortness of breath, body aches.  She has also had uncomfortable vaginal discharge with itching.  She tested positive for bacterial vaginosis at the beginning of this month, took Flagyl and her current symptoms started thereafter.  Has had difficulty with BV regularly.  Has had both her Covid vaccinations.  No current facility-administered medications for this encounter.  Current Outpatient Medications:  .  acetic acid 2 % otic solution, Place 4 drops into the left ear 3 (three) times daily. (Patient not taking: Reported on 08/03/2019), Disp: 15 mL, Rfl: 0 .  Ca Carbonate-Mag Hydroxide (ROLAIDS PO), Take by mouth as needed., Disp: , Rfl:  .  cetirizine (ZYRTEC ALLERGY) 10 MG tablet, Take 1 tablet (10 mg total) by mouth daily., Disp: 30 tablet, Rfl: 0 .  clindamycin (CLINDAGEL) 1 % gel, Apply topically 2 (two) times daily., Disp: 60 g, Rfl: 11 .  ELDERBERRY PO, Take by mouth., Disp: , Rfl:  .  fluticasone (FLONASE) 50 MCG/ACT nasal spray, Place 1 spray into both nostrils daily., Disp: 15.8 mL, Rfl: 2 .  Menthol (CEPACOL SORE THROAT) 5.4 MG LOZG, Use as directed 1 lozenge (5.4 mg total) in the mouth or throat every 2 (two) hours as needed., Disp: 30 lozenge, Rfl: 0 .  Norethindrone Acetate-Ethinyl Estrad-FE (LOESTRIN 24 FE) 1-20 MG-MCG(24) tablet, Take 1 tablet by mouth daily. (Patient not taking: Reported on 08/03/2019), Disp: 1 Package, Rfl: 11 .  oxyCODONE-acetaminophen (PERCOCET/ROXICET) 5-325 MG tablet, Take 1-2 tablets by mouth every 6 (six) hours as needed. (Patient not taking: Reported on 06/16/2019), Disp: 20 tablet, Rfl: 0 .  pantoprazole (PROTONIX) 20 MG tablet, Take 20 mg by mouth daily. (Patient not taking: Reported on 08/03/2019),  Disp: , Rfl:  .  Probiotic Product (CULTURELLE PROBIOTICS PO), Take by mouth., Disp: , Rfl:    No Known Allergies  Past Medical History:  Diagnosis Date  . Endometriosis   . History of 2019 novel coronavirus disease (COVID-19)    pt tested positive 03-03-2019, results in care everywhere.  (05-22-2019 per pt had symptoms of fatigue, headache, and loss of taste/ smell;  fatigue and headache after one week and loss taste/ smell resolved after 3 wks)  . Infertility, female   . Ovarian cyst   . Pelvic mass in female    large fluid filled pelvic mass  . Recurrent boils   . Septate uterus      Past Surgical History:  Procedure Laterality Date  . BREAST BIOPSY Right 1999   per pt benign   . LAPAROSCOPIC SALPINGOOPHERECTOMY Right 03-25-2010   @[facility]    and LYSIS ADHESIONS  . LAPAROSCOPY N/A 05/30/2019   Procedure: DIAGNOSTIC LAPAROSCOPY;  Surgeon: Woodroe Mode, MD;  [facility];  Service: Gynecology;  Laterality: N/A;    Family History  Problem Relation Age of Onset  . Hypertension Mother   . Diabetes Mother   . Cancer Father   . Hypertension Father   . Diabetes Brother   . Hypertension Brother   . Breast cancer Neg Hx     Social History   Tobacco Use  . Smoking status: Current Every Day Smoker    Packs/day: 0.25    Years: 15.00    Pack years: 3.75  Types: Cigarettes  . Smokeless tobacco: Never Used  Vaping Use  . Vaping Use: Never used  Substance Use Topics  . Alcohol use: Yes    Comment: occasionally  . Drug use: Never    ROS   Objective:   Vitals: BP 103/76   Pulse 97   Temp 98.6 F (37 C) (Oral)   Resp 16   Ht 5\' 2"  (1.575 m)   Wt 163 lb (73.9 kg)   [name] 09/07/2019   SpO2 96%   BMI 29.81 kg/m   Physical Exam Constitutional:      General: She is not in acute distress.    Appearance: Normal appearance. She is well-developed. She is not ill-appearing, toxic-appearing or diaphoretic.  HENT:     Head: Normocephalic and  atraumatic.     Nose: Rhinorrhea present.     Mouth/Throat:     Mouth: Mucous membranes are moist.     Pharynx: Oropharynx is clear.  Eyes:     General: No scleral icterus.       Right eye: No discharge.        Left eye: No discharge.     Extraocular Movements: Extraocular movements intact.     Conjunctiva/sclera: Conjunctivae normal.     Pupils: Pupils are equal, round, and reactive to light.  Cardiovascular:     Rate and Rhythm: Normal rate.  Pulmonary:     Effort: Pulmonary effort is normal.  Abdominal:     General: Bowel sounds are normal. There is no distension.     Palpations: Abdomen is soft. There is no mass.     Tenderness: There is no abdominal tenderness. There is no right CVA tenderness, left CVA tenderness, guarding or rebound.  Skin:    General: Skin is warm and dry.  Neurological:     General: No focal deficit present.     Mental Status: She is alert and oriented to person, place, and time.  Psychiatric:        Mood and Affect: Mood normal.        Behavior: Behavior normal.        Thought Content: Thought content normal.        Judgment: Judgment normal.     Results for orders placed or performed during the hospital encounter of 10/02/19 (from the past 24 hour(s))  POC urine pregnancy     Status: None   Collection Time: 10/02/19  8:40 AM  Result Value Ref Range   Preg Test, Ur NEGATIVE NEGATIVE    Assessment and Plan :   PDMP not reviewed this encounter.  1. Yeast vaginitis   2. Runny nose   3. Vaginal discharge     Suspect yeast vaginitis given her recent Flagyl use.  She was agreeable to trialing Diflucan, labs pending.  Recommended Zyrtec and Sudafed as needed for rhinitis that is noninfectious. Counseled patient on potential for adverse effects with medications prescribed/recommended today, ER and return-to-clinic precautions discussed, patient verbalized understanding.    Jaynee Eagles, [place] 10/02/19 [phone]

## 2019-10-02 NOTE — ED Triage Notes (Signed)
Pt c/o vaginal itching and white dischargex4 days. Pt wants STi testing. Pt also states has been having runny nosex2wks. Pt wants COVID test.

## 2019-10-03 LAB — CERVICOVAGINAL ANCILLARY ONLY
Bacterial Vaginitis (gardnerella): POSITIVE — AB
Candida Glabrata: POSITIVE — AB
Candida Vaginitis: NEGATIVE
Chlamydia: NEGATIVE
Comment: NEGATIVE
Comment: NEGATIVE
Comment: NEGATIVE
Comment: NEGATIVE
Comment: NEGATIVE
Comment: NORMAL
Neisseria Gonorrhea: NEGATIVE
Trichomonas: NEGATIVE

## 2019-10-04 ENCOUNTER — Telehealth: Payer: 59 | Admitting: Family Medicine

## 2019-10-05 ENCOUNTER — Telehealth (HOSPITAL_COMMUNITY): Payer: Self-pay | Admitting: Emergency Medicine

## 2019-10-05 MED ORDER — METRONIDAZOLE 0.75 % VA GEL: 1.0000 | Freq: Two times a day (BID) | VAGINAL | 0 refills | Status: AC

## 2019-10-16 ENCOUNTER — Encounter (HOSPITAL_COMMUNITY): Payer: Self-pay

## 2019-10-16 ENCOUNTER — Ambulatory Visit (HOSPITAL_COMMUNITY)
Admission: EM | Admit: 2019-10-16 | Discharge: 2019-10-16 | Disposition: A | Discharge status: Home / Self Care | Payer: 59 | Attending: Urgent Care | Admitting: Urgent Care

## 2019-10-16 ENCOUNTER — Other Ambulatory Visit: Payer: Self-pay

## 2019-10-16 DIAGNOSIS — A084 Viral intestinal infection, unspecified: Secondary | ICD-10-CM | POA: Insufficient documentation

## 2019-10-16 DIAGNOSIS — R112 Nausea with vomiting, unspecified: Secondary | ICD-10-CM | POA: Insufficient documentation

## 2019-10-16 DIAGNOSIS — R519 Headache, unspecified: Secondary | ICD-10-CM | POA: Insufficient documentation

## 2019-10-16 DIAGNOSIS — R197 Diarrhea, unspecified: Secondary | ICD-10-CM | POA: Diagnosis not present

## 2019-10-16 DIAGNOSIS — Z20822 Contact with and (suspected) exposure to covid-19: Secondary | ICD-10-CM | POA: Diagnosis not present

## 2019-10-16 DIAGNOSIS — R52 Pain, unspecified: Secondary | ICD-10-CM

## 2019-10-16 LAB — SARS CORONAVIRUS 2 (TAT 6-24 HRS): SARS Coronavirus 2: NEGATIVE

## 2019-10-16 MED ORDER — ONDANSETRON 8 MG PO TBDP
8.0000 mg | ORAL_TABLET | Freq: Three times a day (TID) | ORAL | 0 refills | Status: DC | PRN
Start: 2019-10-16 — End: 2020-05-28

## 2019-10-16 MED ORDER — LOPERAMIDE HCL 2 MG PO CAPS
2.0000 mg | ORAL_CAPSULE | Freq: Every day | ORAL | 0 refills | Status: DC | PRN
Start: 2019-10-16 — End: 2019-12-25

## 2019-10-16 NOTE — Discharge Instructions (Signed)
Make sure you push fluids drinking mostly water but mix it with Gatorade.  Try to eat light meals including soups, broths and soft foods, fruits.  You may use Zofran for your nausea and vomiting once every 8 hours.  Imodium can help with diarrhea but use this carefully limiting it to 1-2 times per day only if you are having a lot of diarrhea.  Please return to the clinic if symptoms worsen or you start having severe abdominal pain not helped by taking Tylenol or start having bloody stools or blood in the vomit.  Do not use any nonsteroidal anti-inflammatories (NSAIDs) like ibuprofen, Motrin, naproxen, Aleve, etc. which are all available over-the-counter.  Please just use Tylenol at a dose of 500mg -650mg  once every 6 hours as needed for your aches, pains, fevers.

## 2019-10-16 NOTE — ED Triage Notes (Signed)
Pt presents with diarrhea since this morning; vomiting, headache, chills and body aches x 2 days.

## 2019-10-16 NOTE — ED Provider Notes (Signed)
[name]   MRN: [id number] DOB: 10-Oct-1980  Subjective:   [name] is a 39 y.o. female presenting for 2-day history of acute onset nausea, vomiting, diarrhea, headaches, body aches, chills.  Patient states that she is concerned about Covid and wants to make sure she does not have this.  Denies any direct exposure.  She did complete her vaccination in April.  Denies fever, sore throat, cough, chest pain, shortness of breath, bloody stools, recent antibiotic use, hospitalizations.  Denies eating any questionable foods.  Has been trying to hydrate, using ginger ale for symptom relief.  No current facility-administered medications for this encounter.  Current Outpatient Medications:    acetic acid 2 % otic solution, Place 4 drops into the left ear 3 (three) times daily. (Patient not taking: Reported on 08/03/2019), Disp: 15 mL, Rfl: 0   Ca Carbonate-Mag Hydroxide (ROLAIDS PO), Take by mouth as needed., Disp: , Rfl:    cetirizine (ZYRTEC ALLERGY) 10 MG tablet, Take 1 tablet (10 mg total) by mouth daily., Disp: 30 tablet, Rfl: 0   clindamycin (CLINDAGEL) 1 % gel, Apply topically 2 (two) times daily., Disp: 60 g, Rfl: 11   ELDERBERRY PO, Take by mouth., Disp: , Rfl:    fluconazole (DIFLUCAN) 150 MG tablet, Take 1 tablet (150 mg total) by mouth once a week., Disp: 2 tablet, Rfl: 0   fluticasone (FLONASE) 50 MCG/ACT nasal spray, Place 1 spray into both nostrils daily., Disp: 15.8 mL, Rfl: 2   Menthol (CEPACOL SORE THROAT) 5.4 MG LOZG, Use as directed 1 lozenge (5.4 mg total) in the mouth or throat every 2 (two) hours as needed., Disp: 30 lozenge, Rfl: 0   Norethindrone Acetate-Ethinyl Estrad-FE (LOESTRIN 24 FE) 1-20 MG-MCG(24) tablet, Take 1 tablet by mouth daily. (Patient not taking: Reported on 08/03/2019), Disp: 1 Package, Rfl: 11   oxyCODONE-acetaminophen (PERCOCET/ROXICET) 5-325 MG tablet, Take 1-2 tablets by mouth every 6 (six) hours as needed. (Patient not taking:  Reported on 06/16/2019), Disp: 20 tablet, Rfl: 0   pantoprazole (PROTONIX) 20 MG tablet, Take 20 mg by mouth daily. (Patient not taking: Reported on 08/03/2019), Disp: , Rfl:    Probiotic Product (CULTURELLE PROBIOTICS PO), Take by mouth., Disp: , Rfl:    pseudoephedrine (SUDAFED) 30 MG tablet, Take 1 tablet (30 mg total) by mouth every 8 (eight) hours as needed for congestion., Disp: 30 tablet, Rfl: 0   No Known Allergies  Past Medical History:  Diagnosis Date   Endometriosis    History of 2019 novel coronavirus disease (COVID-19)    pt tested positive 03-03-2019, results in care everywhere.  (05-22-2019 per pt had symptoms of fatigue, headache, and loss of taste/ smell;  fatigue and headache after one week and loss taste/ smell resolved after 3 wks)   Infertility, female    Ovarian cyst    Pelvic mass in female    large fluid filled pelvic mass   Recurrent boils    Septate uterus      Past Surgical History:  Procedure Laterality Date   BREAST BIOPSY Right 1999   per pt benign    LAPAROSCOPIC SALPINGOOPHERECTOMY Right 03-25-2010   @[facility]    and LYSIS ADHESIONS   LAPAROSCOPY N/A 05/30/2019   Procedure: DIAGNOSTIC LAPAROSCOPY;  Surgeon: Woodroe Mode, MD;  Location: [facility];  Service: Gynecology;  Laterality: N/A;    Family History  Problem Relation Age of Onset   Hypertension Mother    Diabetes Mother    Cancer Father  Hypertension Father    Diabetes Brother    Hypertension Brother    Breast cancer Neg Hx     Social History   Tobacco Use   Smoking status: Current Every Day Smoker    Packs/day: 0.25    Years: 15.00    Pack years: 3.75    Types: Cigarettes   Smokeless tobacco: Never Used  Scientific laboratory technician Use: Never used  Substance Use Topics   Alcohol use: Yes    Comment: occasionally   Drug use: Never    ROS   Objective:   Vitals: BP 108/79 (BP Location: Right Arm)    Pulse 96    Temp 98.6 F (37 C) (Oral)     Resp 18    [name]  (Within Weeks) Comment: 1 week   SpO2 100%   Physical Exam Constitutional:      General: She is not in acute distress.    Appearance: Normal appearance. She is well-developed and normal weight. She is not ill-appearing, toxic-appearing or diaphoretic.  HENT:     Head: Normocephalic and atraumatic.     Right Ear: External ear normal.     Left Ear: External ear normal.     Nose: Nose normal.     Mouth/Throat:     Mouth: Mucous membranes are moist.     Pharynx: Oropharynx is clear.  Eyes:     General: No scleral icterus.    Extraocular Movements: Extraocular movements intact.     Pupils: Pupils are equal, round, and reactive to light.  Cardiovascular:     Rate and Rhythm: Normal rate and regular rhythm.     Heart sounds: Normal heart sounds. No murmur heard.  No friction rub. No gallop.   Pulmonary:     Effort: Pulmonary effort is normal. No respiratory distress.     Breath sounds: Normal breath sounds. No stridor. No wheezing, rhonchi or rales.  Abdominal:     General: Bowel sounds are normal. There is no distension.     Palpations: Abdomen is soft. There is no mass.     Tenderness: There is generalized abdominal tenderness and tenderness in the right lower quadrant and epigastric area. There is no right CVA tenderness, left CVA tenderness, guarding or rebound.  Skin:    General: Skin is warm and dry.     Coloration: Skin is not pale.     Findings: No rash.  Neurological:     General: No focal deficit present.     Mental Status: She is alert and oriented to person, place, and time.     Cranial Nerves: No cranial nerve deficit.     Motor: No weakness.     Coordination: Coordination normal.     Gait: Gait normal.     Deep Tendon Reflexes: Reflexes normal.  Psychiatric:        Mood and Affect: Mood normal.        Behavior: Behavior normal.        Thought Content: Thought content normal.        Judgment: Judgment normal.      Assessment and Plan :   PDMP  not reviewed this encounter.  1. Viral gastroenteritis   2. Nausea vomiting and diarrhea   3. Body aches   4. Generalized headaches     COVID-19 testing pending.  Low suspicion for appendicitis given lack of fever, worse pain over epigastric area as opposed to right lower quadrant. Will manage for suspected viral gastroenteritis with  supportive care.  Recommended patient hydrate well, eat light meals and maintain electrolytes.  Will use Zofran and Imodium for nausea, vomiting and diarrhea. Counseled patient on potential for adverse effects with medications prescribed/recommended today, ER and return-to-clinic precautions discussed, patient verbalized understanding.    Jaynee Eagles, [place] 10/16/19 [phone]

## 2019-10-23 ENCOUNTER — Encounter: Payer: Self-pay | Admitting: Student in an Organized Health Care Education/Training Program

## 2019-10-25 ENCOUNTER — Other Ambulatory Visit (HOSPITAL_BASED_OUTPATIENT_CLINIC_OR_DEPARTMENT_OTHER): Payer: 59

## 2019-10-27 ENCOUNTER — Ambulatory Visit (INDEPENDENT_AMBULATORY_CARE_PROVIDER_SITE_OTHER): Payer: 59 | Admitting: Family Medicine

## 2019-10-27 ENCOUNTER — Ambulatory Visit: Payer: 59 | Admitting: Family Medicine

## 2019-10-27 ENCOUNTER — Other Ambulatory Visit (HOSPITAL_COMMUNITY)
Admission: RE | Admit: 2019-10-27 | Discharge: 2019-10-27 | Disposition: A | Discharge status: Home / Self Care | Payer: 59 | Source: Ambulatory Visit | Attending: Family Medicine | Admitting: Family Medicine

## 2019-10-27 ENCOUNTER — Other Ambulatory Visit: Payer: Self-pay

## 2019-10-27 ENCOUNTER — Encounter: Payer: Self-pay | Admitting: Family Medicine

## 2019-10-27 VITALS — BP 114/70 | HR 96 | Ht 62.0 in | Wt 166.0 lb

## 2019-10-27 DIAGNOSIS — N760 Acute vaginitis: Secondary | ICD-10-CM

## 2019-10-27 DIAGNOSIS — R399 Unspecified symptoms and signs involving the genitourinary system: Secondary | ICD-10-CM

## 2019-10-27 DIAGNOSIS — Z23 Encounter for immunization: Secondary | ICD-10-CM | POA: Diagnosis not present

## 2019-10-27 DIAGNOSIS — N898 Other specified noninflammatory disorders of vagina: Secondary | ICD-10-CM | POA: Insufficient documentation

## 2019-10-27 DIAGNOSIS — B9689 Other specified bacterial agents as the cause of diseases classified elsewhere: Secondary | ICD-10-CM

## 2019-10-27 LAB — POCT URINALYSIS DIP (MANUAL ENTRY)
Bilirubin, UA: NEGATIVE
Blood, UA: NEGATIVE
Glucose, UA: NEGATIVE mg/dL
Ketones, POC UA: NEGATIVE mg/dL
Leukocytes, UA: NEGATIVE
Nitrite, UA: NEGATIVE
Protein Ur, POC: 30 mg/dL — AB
Spec Grav, UA: >=1.03 — AB (ref 1.010–1.025)
Urobilinogen, UA: 0.2 E.U./dL
pH, UA: 5.5 (ref 5.0–8.0)

## 2019-10-27 LAB — POCT UA - MICROSCOPIC ONLY: Epithelial cells, urine per micros: >20

## 2019-10-27 MED ORDER — METRONIDAZOLE 500 MG PO TABS
500.0000 mg | ORAL_TABLET | Freq: Two times a day (BID) | ORAL | 0 refills | Status: DC
Start: 2019-10-27 — End: 2019-10-27

## 2019-10-27 MED ORDER — FLUCONAZOLE 150 MG PO TABS: 150.0000 mg | ORAL_TABLET | ORAL | 0 refills | Status: DC

## 2019-10-27 MED ORDER — CLINDAMYCIN HCL 300 MG PO CAPS: 300.0000 mg | ORAL_CAPSULE | Freq: Two times a day (BID) | ORAL | 0 refills | Status: DC

## 2019-10-27 NOTE — Patient Instructions (Signed)
Thank bacterial vaginosis: Based on microscopy, it does look like you have bacterial vaginosis. This is an overgrowth of common vaginal flora. We treated with a 7-day course of antibiotics. It is possible that antibiotics can lead to yeast infection of the vagina. I will give you a medication called Diflucan. If you notice that you have vaginal itching and additional white discharge following her antibiotics, take the 1 pill of Diflucan to treat a potential yeast infection.  I will let you know if any of the additional labs have concerning findings.

## 2019-10-27 NOTE — Assessment & Plan Note (Signed)
Pregnancy test negative.  Microscopy was notable for clue cells.  No apparent hyphae. -Clindamycin 300 mg twice daily for 7 days (she had significant GI side effects with metronidazole) -Diflucan prescribed in case of yeast infection following antibiotic course -Follow-up HIV, RPR, gonorrhea/committee

## 2019-10-27 NOTE — Progress Notes (Signed)
    SUBJECTIVE:   CHIEF COMPLAINT / HPI:   Vaginal discharge Ms. Meinhardt reports that she has been having vaginal discharge for roughly the past 3 days.  She reports that this is a thick white discharge and she often sees small clots in her urine.  In addition to this vaginal discharge, she has noted some mild suprapubic discomfort and nausea.  She specifically denies vaginal itching, fever, vomiting.  She also had some concern for urinary tract infection because she has been urinating frequently but denies dysuria, fever, middle back pain. She is currently sexually active.  PERTINENT  PMH / PSH: Previous episodes of bacterial vaginosis  OBJECTIVE:   BP 114/70   Pulse 96   Ht 5\' 2"  (1.575 m)   Wt 166 lb (75.3 kg)   [name] 10/10/2019   SpO2 99%   BMI 30.36 kg/m    General: Well-appearing middle-aged woman.  No acute distress. Respiratory: Breathing comfortably on room air. Pelvic: Pink, moist vaginal mucosa.  Mild thick white vaginal discharge.  No significant malodor.  Samples taken.  No significant cervical motion tenderness or enlarged adnexa on bimanual exam.  ASSESSMENT/PLAN:   BV (bacterial vaginosis) Pregnancy test negative.  Microscopy was notable for clue cells.  No apparent hyphae. -Clindamycin 300 mg twice daily for 7 days (she had significant GI side effects with metronidazole) -Diflucan prescribed in case of yeast infection following antibiotic course -Follow-up HIV, RPR, gonorrhea/committee     Matilde Haymaker, MD [place]

## 2019-10-28 LAB — HIV ANTIBODY (ROUTINE TESTING W REFLEX): HIV Screen 4th Generation wRfx: NONREACTIVE

## 2019-10-28 LAB — RPR: RPR Ser Ql: NONREACTIVE

## 2019-10-31 LAB — CERVICOVAGINAL ANCILLARY ONLY
Bacterial Vaginitis (gardnerella): POSITIVE — AB
Candida Glabrata: NEGATIVE
Candida Vaginitis: NEGATIVE
Chlamydia: NEGATIVE
Comment: NEGATIVE
Comment: NEGATIVE
Comment: NEGATIVE
Comment: NEGATIVE
Comment: NEGATIVE
Comment: NORMAL
Neisseria Gonorrhea: NEGATIVE
Trichomonas: NEGATIVE

## 2019-11-08 ENCOUNTER — Telehealth: Payer: Self-pay | Admitting: Obstetrics & Gynecology

## 2019-11-08 NOTE — Telephone Encounter (Signed)
TC to pt. No answer. Left message to call back. Re [place] results.   Ciani Rutten L. Harraway-Smith, M.D., [place]lynn June

## 2019-11-10 ENCOUNTER — Telehealth: Payer: 59 | Admitting: Obstetrics & Gynecology

## 2019-11-22 ENCOUNTER — Ambulatory Visit (HOSPITAL_COMMUNITY)
Admission: EM | Admit: 2019-11-22 | Discharge: 2019-11-22 | Disposition: A | Discharge status: Home / Self Care | Payer: 59 | Attending: Family Medicine | Admitting: Family Medicine

## 2019-11-22 ENCOUNTER — Other Ambulatory Visit: Payer: Self-pay

## 2019-11-22 ENCOUNTER — Encounter (HOSPITAL_COMMUNITY): Payer: Self-pay | Admitting: Emergency Medicine

## 2019-11-22 DIAGNOSIS — Z113 Encounter for screening for infections with a predominantly sexual mode of transmission: Secondary | ICD-10-CM | POA: Diagnosis not present

## 2019-11-22 DIAGNOSIS — N898 Other specified noninflammatory disorders of vagina: Secondary | ICD-10-CM | POA: Insufficient documentation

## 2019-11-22 DIAGNOSIS — Z3202 Encounter for pregnancy test, result negative: Secondary | ICD-10-CM

## 2019-11-22 DIAGNOSIS — R109 Unspecified abdominal pain: Secondary | ICD-10-CM | POA: Diagnosis not present

## 2019-11-22 DIAGNOSIS — R1030 Lower abdominal pain, unspecified: Secondary | ICD-10-CM | POA: Diagnosis not present

## 2019-11-22 LAB — POC URINE PREG, ED: Preg Test, Ur: NEGATIVE

## 2019-11-22 LAB — POCT URINALYSIS DIPSTICK, ED / UC
Glucose, UA: NEGATIVE mg/dL
Hgb urine dipstick: NEGATIVE
Ketones, ur: 15 mg/dL — AB
Leukocytes,Ua: NEGATIVE
Nitrite: NEGATIVE
Protein, ur: 30 mg/dL — AB
Specific Gravity, Urine: >=1.03 (ref 1.005–1.030)
Urobilinogen, UA: 1 mg/dL (ref 0.0–1.0)
pH: 5.5 (ref 5.0–8.0)

## 2019-11-22 NOTE — ED Triage Notes (Signed)
Pt c/o lower abd pain x 4 days. She denies n/v/d. She has been taking ibuprofen with no relief. She feels like she is urinating more frequently. She has had vaginal discharge but denies any bleeding. [name] 11/06/19.

## 2019-11-22 NOTE — ED Provider Notes (Signed)
[place]    CSN: [id number] Arrival date & time: 11/22/19  0801      History   Chief Complaint Chief Complaint  Patient presents with  . Abdominal Pain  . SEXUALLY TRANSMITTED DISEASE    HPI [name] is a 39 y.o. female.   Here today concerned about several days of vaginal discharge, pelvic pressure and urinary frequency. She also c/o b/l lower back aching. Denies fever, chills, hematuria, known exposure to STIs, N/V/D. Has not been taking anything OTC for sxs. Of note, she is working with GYN now on management of known hydrosalpinx which she thinks is causing some of her chronic right lower pelvic pain and she also has a hx of recurrent BV, which she states feels consistent with this episode.      Past Medical History:  Diagnosis Date  . Endometriosis   . History of 2019 novel coronavirus disease (COVID-19)    pt tested positive 03-03-2019, results in care everywhere.  (05-22-2019 per pt had symptoms of fatigue, headache, and loss of taste/ smell;  fatigue and headache after one week and loss taste/ smell resolved after 3 wks)  . Infertility, female   . Ovarian cyst   . Pelvic mass in female    large fluid filled pelvic mass  . Recurrent boils   . Septate uterus     Patient Active Problem List   Diagnosis Date Noted  . Abscess 08/21/2019  . Discharge from the vagina 07/08/2019  . Dysuria 07/08/2019  . GERD (gastroesophageal reflux disease) 06/07/2019  . HSV-2 seropositive 03/07/2019  . Hydrosalpinx 12/30/2018  . Screening examination for STD (sexually transmitted disease) 07/09/2017  . Breast mass in female 01/22/2013  . BV (bacterial vaginosis) 01/18/2012  . Endometriosis 05/01/2011  . SEPTATE UTERUS 07/24/2009  . TOBACCO ABUSE 09/26/2008  . Female infertility 09/26/2008  . IRREGULAR MENSTRUATION 09/21/2006    Past Surgical History:  Procedure Laterality Date  . BREAST BIOPSY Right 1999   per pt benign   . LAPAROSCOPIC  SALPINGOOPHERECTOMY Right 03-25-2010   @[facility]    and LYSIS ADHESIONS  . LAPAROSCOPY N/A 05/30/2019   Procedure: DIAGNOSTIC LAPAROSCOPY;  Surgeon: Woodroe Mode, MD;  Location: [facility];  Service: Gynecology;  Laterality: N/A;    OB History    Gravida  0   Para      Term      Preterm      AB      Living  0     SAB      TAB      Ectopic      Multiple      Live Births               Home Medications    Prior to Admission medications   Medication Sig Start Date End Date Taking? Authorizing Provider  acetic acid 2 % otic solution Place 4 drops into the left ear 3 (three) times daily. Patient not taking: Reported on 08/03/2019 06/15/19   Scot Jun, FNP  Ca Carbonate-Mag Hydroxide (ROLAIDS PO) Take by mouth as needed.    [provider]  cetirizine (ZYRTEC ALLERGY) 10 MG tablet Take 1 tablet (10 mg total) by mouth daily. 10/02/19   Jaynee Eagles, PA-C  clindamycin (CLEOCIN) 300 MG capsule Take 1 capsule (300 mg total) by mouth in the morning and at bedtime. 10/27/19   Matilde Haymaker, MD  clindamycin (CLINDAGEL) 1 % gel Apply topically 2 (two) times daily.  09/13/19   Lavonia Drafts, MD  ELDERBERRY PO Take by mouth.    [provider]  fluconazole (DIFLUCAN) 150 MG tablet Take 1 tablet (150 mg total) by mouth once a week. 10/27/19   Matilde Haymaker, MD  fluticasone (FLONASE) 50 MCG/ACT nasal spray Place 1 spray into both nostrils daily. 09/14/19   Darr, Marguerita Beards, PA-C  loperamide (IMODIUM) 2 MG capsule Take 1 capsule (2 mg total) by mouth daily as needed for diarrhea or loose stools. 10/16/19   Jaynee Eagles, PA-C  Menthol (CEPACOL SORE THROAT) 5.4 MG LOZG Use as directed 1 lozenge (5.4 mg total) in the mouth or throat every 2 (two) hours as needed. 09/14/19   Darr, Marguerita Beards, PA-C  Norethindrone Acetate-Ethinyl Estrad-FE (LOESTRIN 24 FE) 1-20 MG-MCG(24) tablet Take 1 tablet by mouth daily. Patient not taking: Reported on 08/03/2019 06/16/19   Donnamae Jude, MD  ondansetron (ZOFRAN-ODT) 8 MG disintegrating tablet Take 1 tablet (8 mg total) by mouth every 8 (eight) hours as needed for nausea or vomiting. 10/16/19   Jaynee Eagles, PA-C  oxyCODONE-acetaminophen (PERCOCET/ROXICET) 5-325 MG tablet Take 1-2 tablets by mouth every 6 (six) hours as needed. Patient not taking: Reported on 06/16/2019 05/30/19   Woodroe Mode, MD  pantoprazole (PROTONIX) 20 MG tablet Take 20 mg by mouth daily. Patient not taking: Reported on 08/03/2019    [provider]  Probiotic Product (CULTURELLE PROBIOTICS PO) Take by mouth.    [provider]  pseudoephedrine (SUDAFED) 30 MG tablet Take 1 tablet (30 mg total) by mouth every 8 (eight) hours as needed for congestion. 10/02/19   Jaynee Eagles, PA-C  Norgestimate-Ethinyl Estradiol Triphasic (TRI-SPRINTEC) 0.18/0.215/0.25 MG-35 MCG tablet Take 1 tablet by mouth daily. 11/03/10 05/01/11  Woodroe Mode, MD    Family History Family History  Problem Relation Age of Onset  . Hypertension Mother   . Diabetes Mother   . Cancer Father   . Hypertension Father   . Diabetes Brother   . Hypertension Brother   . Breast cancer Neg Hx     Social History Social History   Tobacco Use  . Smoking status: Current Every Day Smoker    Packs/day: 0.25    Years: 15.00    Pack years: 3.75    Types: Cigarettes  . Smokeless tobacco: Never Used  Vaping Use  . Vaping Use: Never used  Substance Use Topics  . Alcohol use: Yes    Comment: occasionally  . Drug use: Never     Allergies   Sulfa antibiotics   Review of Systems Review of Systems PER HPI    Physical Exam Triage Vital Signs ED Triage Vitals  Enc Vitals Group     BP 11/22/19 0830 (!) 137/106     Pulse Rate 11/22/19 0830 (!) 101     Resp --      Temp 11/22/19 0830 98.6 F (37 C)     Temp Source 11/22/19 0830 Oral     SpO2 11/22/19 0830 100 %     Weight --      Height --      Head Circumference --      Peak Flow --      Pain Score  11/22/19 0820 5     Pain Loc --      Pain Edu? --      Excl. in [place]? --    No data found.  Updated Vital Signs BP 109/83 (BP Location: Right Arm)   Pulse Marland Kitchen)  101   Temp 98.6 F (37 C) (Oral)   [name] 11/06/2019   SpO2 100%   Visual Acuity Right Eye Distance:   Left Eye Distance:   Bilateral Distance:    Right Eye Near:   Left Eye Near:    Bilateral Near:     Physical Exam Vitals and nursing note reviewed. Exam conducted with a chaperone present.  Constitutional:      Appearance: Normal appearance. She is not ill-appearing.  HENT:     Head: Atraumatic.  Eyes:     Extraocular Movements: Extraocular movements intact.     Conjunctiva/sclera: Conjunctivae normal.  Cardiovascular:     Rate and Rhythm: Normal rate and regular rhythm.     Heart sounds: Normal heart sounds.  Pulmonary:     Effort: Pulmonary effort is normal.     Breath sounds: Normal breath sounds.  Abdominal:     General: Bowel sounds are normal. There is no distension.     Palpations: Abdomen is soft.     Tenderness: There is abdominal tenderness (RLQ pain). There is no right CVA tenderness, left CVA tenderness, guarding or rebound.  Genitourinary:    General: Normal vulva.     Vagina: Vaginal discharge (white clumpy discharge in vaginal vault) present.  Musculoskeletal:        General: Normal range of motion.     Cervical back: Normal range of motion and neck supple.  Skin:    General: Skin is warm and dry.  Neurological:     Mental Status: She is alert and oriented to person, place, and time.  Psychiatric:        Mood and Affect: Mood normal.        Thought Content: Thought content normal.        Judgment: Judgment normal.      UC Treatments / Results  Labs (all labs ordered are listed, but only abnormal results are displayed) Labs Reviewed  POCT URINALYSIS DIPSTICK, ED / UC - Abnormal; Notable for the following components:      Result Value   Bilirubin Urine SMALL (*)    Ketones, ur 15 (*)     Protein, ur 30 (*)    All other components within normal limits  POC URINE PREG, ED  CERVICOVAGINAL ANCILLARY ONLY    EKG   Radiology No results found.  Procedures Procedures (including critical care time)  Medications Ordered in UC Medications - No data to display  Initial Impression / Assessment and Plan / UC Course  I have reviewed the triage vital signs and the nursing notes.  Pertinent labs & imaging results that were available during my care of the patient were reviewed by me and considered in my medical decision making (see chart for details).     U/A and urine preg negative, aptima swab pending. Discussed treatment to be based on results to avoid overuse of abx if not needed. Boric acid suppositories, probiotics and good vaginal hygiene reviewed. Close GYN follow-up recommended.   Final Clinical Impressions(s) / UC Diagnoses   Final diagnoses:  Vaginal discharge  Lower abdominal pain   Discharge Instructions   None    ED Prescriptions    None     PDMP not reviewed this encounter.   Merrie Roof [place], [place] 11/22/19 [phone]

## 2019-11-23 ENCOUNTER — Telehealth (HOSPITAL_COMMUNITY): Payer: Self-pay | Admitting: Emergency Medicine

## 2019-11-23 LAB — CERVICOVAGINAL ANCILLARY ONLY
Bacterial Vaginitis (gardnerella): POSITIVE — AB
Candida Glabrata: NEGATIVE
Candida Vaginitis: NEGATIVE
Chlamydia: NEGATIVE
Comment: NEGATIVE
Comment: NEGATIVE
Comment: NEGATIVE
Comment: NEGATIVE
Comment: NEGATIVE
Comment: NORMAL
Neisseria Gonorrhea: NEGATIVE
Trichomonas: POSITIVE — AB

## 2019-11-23 MED ORDER — METRONIDAZOLE 500 MG PO TABS: 500.0000 mg | ORAL_TABLET | Freq: Two times a day (BID) | ORAL | 0 refills | Status: DC

## 2019-12-03 ENCOUNTER — Encounter (HOSPITAL_COMMUNITY): Payer: Self-pay | Admitting: *Deleted

## 2019-12-03 ENCOUNTER — Other Ambulatory Visit: Payer: Self-pay

## 2019-12-03 ENCOUNTER — Ambulatory Visit (HOSPITAL_COMMUNITY)
Admission: EM | Admit: 2019-12-03 | Discharge: 2019-12-03 | Disposition: A | Discharge status: Home / Self Care | Payer: 59 | Attending: Family Medicine | Admitting: Family Medicine

## 2019-12-03 DIAGNOSIS — L0291 Cutaneous abscess, unspecified: Secondary | ICD-10-CM | POA: Diagnosis present

## 2019-12-03 DIAGNOSIS — N760 Acute vaginitis: Secondary | ICD-10-CM | POA: Diagnosis not present

## 2019-12-03 DIAGNOSIS — Z202 Contact with and (suspected) exposure to infections with a predominantly sexual mode of transmission: Secondary | ICD-10-CM

## 2019-12-03 DIAGNOSIS — Z113 Encounter for screening for infections with a predominantly sexual mode of transmission: Secondary | ICD-10-CM | POA: Diagnosis not present

## 2019-12-03 HISTORY — DX: Trichomoniasis, unspecified: A59.9

## 2019-12-03 LAB — POCT URINALYSIS DIPSTICK, ED / UC
Bilirubin Urine: NEGATIVE
Glucose, UA: NEGATIVE mg/dL
Hgb urine dipstick: NEGATIVE
Leukocytes,Ua: NEGATIVE
Nitrite: NEGATIVE
Protein, ur: NEGATIVE mg/dL
Specific Gravity, Urine: 1.025 (ref 1.005–1.030)
Urobilinogen, UA: 0.2 mg/dL (ref 0.0–1.0)
pH: 7 (ref 5.0–8.0)

## 2019-12-03 LAB — HIV ANTIBODY (ROUTINE TESTING W REFLEX): HIV Screen 4th Generation wRfx: NONREACTIVE

## 2019-12-03 MED ORDER — HIBICLENS 4 % EX LIQD: Freq: Every day | CUTANEOUS | 0 refills | Status: DC | PRN

## 2019-12-03 MED ORDER — CLINDAMYCIN HCL 150 MG PO CAPS: 150.0000 mg | ORAL_CAPSULE | Freq: Two times a day (BID) | ORAL | 0 refills | Status: DC

## 2019-12-03 NOTE — ED Provider Notes (Signed)
[place]    CSN: [id number] Arrival date & time: 12/03/19  1000      History   Chief Complaint Chief Complaint  Patient presents with  . Abscess  . STD Check    HPI [name] is a 39 y.o. female.   Here today requesting recheck after completing flagyl course for BV and trichomoniasis. She is unclear if sxs have resolved as she's just coming off her period. Denies abdominal or pelvic pain, fever, chills, dysuria. Also c/o a boil in vaginal area that comes and goes and is painful and swollen at the moment. Has had I and D in the past on the area. Not trying anything OTC for sxs.      Past Medical History:  Diagnosis Date  . Endometriosis   . History of 2019 novel coronavirus disease (COVID-19)    pt tested positive 03-03-2019, results in care everywhere.  (05-22-2019 per pt had symptoms of fatigue, headache, and loss of taste/ smell;  fatigue and headache after one week and loss taste/ smell resolved after 3 wks)  . Infertility, female   . Ovarian cyst   . Pelvic mass in female    large fluid filled pelvic mass  . Recurrent boils   . Septate uterus   . Trichomoniasis     Patient Active Problem List   Diagnosis Date Noted  . Abscess 08/21/2019  . Discharge from the vagina 07/08/2019  . Dysuria 07/08/2019  . GERD (gastroesophageal reflux disease) 06/07/2019  . HSV-2 seropositive 03/07/2019  . Hydrosalpinx 12/30/2018  . Screening examination for STD (sexually transmitted disease) 07/09/2017  . Breast mass in female 01/22/2013  . BV (bacterial vaginosis) 01/18/2012  . Endometriosis 05/01/2011  . SEPTATE UTERUS 07/24/2009  . TOBACCO ABUSE 09/26/2008  . Female infertility 09/26/2008  . IRREGULAR MENSTRUATION 09/21/2006    Past Surgical History:  Procedure Laterality Date  . BREAST BIOPSY Right 1999   per pt benign   . LAPAROSCOPIC SALPINGOOPHERECTOMY Right 03-25-2010   @[facility]    and LYSIS ADHESIONS  . LAPAROSCOPY N/A 05/30/2019   Procedure:  DIAGNOSTIC LAPAROSCOPY;  Surgeon: Woodroe Mode, MD;  Location: [facility];  Service: Gynecology;  Laterality: N/A;    OB History    Gravida  0   Para      Term      Preterm      AB      Living  0     SAB      TAB      Ectopic      Multiple      Live Births               Home Medications    Prior to Admission medications   Medication Sig Start Date End Date Taking? Authorizing Provider  Multiple Vitamins-Minerals (HAIR SKIN NAILS PO) Take by mouth.   Yes [provider]  acetic acid 2 % otic solution Place 4 drops into the left ear 3 (three) times daily. Patient not taking: Reported on 08/03/2019 06/15/19   Scot Jun, FNP  Ca Carbonate-Mag Hydroxide (ROLAIDS PO) Take by mouth as needed.    [provider]  cetirizine (ZYRTEC ALLERGY) 10 MG tablet Take 1 tablet (10 mg total) by mouth daily. 10/02/19   Jaynee Eagles, PA-C  chlorhexidine (HIBICLENS) 4 % external liquid Apply topically daily as needed. 12/03/19   Volney American, PA-C  clindamycin (CLEOCIN) 150 MG capsule Take 1 capsule (150 mg total)  by mouth 2 (two) times daily. 12/03/19   Volney American, PA-C  clindamycin (CLINDAGEL) 1 % gel Apply topically 2 (two) times daily. 09/13/19   Lavonia Drafts, MD  ELDERBERRY PO Take by mouth.    [provider]  fluconazole (DIFLUCAN) 150 MG tablet Take 1 tablet (150 mg total) by mouth once a week. 10/27/19   Matilde Haymaker, MD  fluticasone (FLONASE) 50 MCG/ACT nasal spray Place 1 spray into both nostrils daily. 09/14/19   Darr, Marguerita Beards, PA-C  loperamide (IMODIUM) 2 MG capsule Take 1 capsule (2 mg total) by mouth daily as needed for diarrhea or loose stools. 10/16/19   Jaynee Eagles, PA-C  Menthol (CEPACOL SORE THROAT) 5.4 MG LOZG Use as directed 1 lozenge (5.4 mg total) in the mouth or throat every 2 (two) hours as needed. 09/14/19   Darr, Marguerita Beards, PA-C  metroNIDAZOLE (FLAGYL) 500 MG tablet Take 1 tablet (500 mg  total) by mouth 2 (two) times daily. 11/23/19   Lamptey, Myrene Galas, MD  Norethindrone Acetate-Ethinyl Estrad-FE (LOESTRIN 24 FE) 1-20 MG-MCG(24) tablet Take 1 tablet by mouth daily. Patient not taking: Reported on 08/03/2019 06/16/19   Donnamae Jude, MD  ondansetron (ZOFRAN-ODT) 8 MG disintegrating tablet Take 1 tablet (8 mg total) by mouth every 8 (eight) hours as needed for nausea or vomiting. 10/16/19   Jaynee Eagles, PA-C  oxyCODONE-acetaminophen (PERCOCET/ROXICET) 5-325 MG tablet Take 1-2 tablets by mouth every 6 (six) hours as needed. Patient not taking: Reported on 06/16/2019 05/30/19   Woodroe Mode, MD  pantoprazole (PROTONIX) 20 MG tablet Take 20 mg by mouth daily. Patient not taking: Reported on 08/03/2019    [provider]  Probiotic Product (CULTURELLE PROBIOTICS PO) Take by mouth.    [provider]  pseudoephedrine (SUDAFED) 30 MG tablet Take 1 tablet (30 mg total) by mouth every 8 (eight) hours as needed for congestion. 10/02/19   Jaynee Eagles, PA-C  Norgestimate-Ethinyl Estradiol Triphasic (TRI-SPRINTEC) 0.18/0.215/0.25 MG-35 MCG tablet Take 1 tablet by mouth daily. 11/03/10 05/01/11  Woodroe Mode, MD    Family History Family History  Problem Relation Age of Onset  . Hypertension Mother   . Diabetes Mother   . Cancer Father   . Hypertension Father   . Diabetes Brother   . Hypertension Brother   . Breast cancer Neg Hx     Social History Social History   Tobacco Use  . Smoking status: Current Every Day Smoker    Packs/day: 0.25    Years: 15.00    Pack years: 3.75    Types: Cigarettes  . Smokeless tobacco: Never Used  Vaping Use  . Vaping Use: Never used  Substance Use Topics  . Alcohol use: Yes    Comment: occasionally  . Drug use: Never     Allergies   Sulfa antibiotics   Review of Systems Review of Systems PER HPI   Physical Exam Triage Vital Signs ED Triage Vitals [12/03/19 1012]  Enc Vitals Group     BP 123/75     Pulse Rate 96      Resp 16     Temp 98.5 F (36.9 C)     Temp Source Oral     SpO2 100 %     Weight      Height      Head Circumference      Peak Flow      Pain Score 5     Pain Loc  Pain Edu?      Excl. in [place]?    No data found.  Updated Vital Signs BP 123/75   Pulse 96   Temp 98.5 F (36.9 C) (Oral)   Resp 16   [name] 11/26/2019 (Exact Date)   SpO2 100%   Visual Acuity Right Eye Distance:   Left Eye Distance:   Bilateral Distance:    Right Eye Near:   Left Eye Near:    Bilateral Near:     Physical Exam Vitals and nursing note reviewed. Exam conducted with a chaperone present.  Constitutional:      Appearance: Normal appearance. She is not ill-appearing.  HENT:     Head: Atraumatic.  Eyes:     Extraocular Movements: Extraocular movements intact.     Conjunctiva/sclera: Conjunctivae normal.  Cardiovascular:     Rate and Rhythm: Normal rate and regular rhythm.     Heart sounds: Normal heart sounds.  Pulmonary:     Effort: Pulmonary effort is normal.     Breath sounds: Normal breath sounds.  Musculoskeletal:        General: Normal range of motion.     Cervical back: Normal range of motion and neck supple.  Skin:    General: Skin is warm and dry.     Findings: Erythema (firm, erythematous area about 2 cm in diameter left lateral labia, ttp, not actively draining or fluctuant) present.  Neurological:     Mental Status: She is alert and oriented to person, place, and time.  Psychiatric:        Mood and Affect: Mood normal.        Thought Content: Thought content normal.        Judgment: Judgment normal.    UC Treatments / Results  Labs (all labs ordered are listed, but only abnormal results are displayed) Labs Reviewed  POCT URINALYSIS DIPSTICK, ED / UC - Abnormal; Notable for the following components:      Result Value   Ketones, ur TRACE (*)    All other components within normal limits  HIV ANTIBODY (ROUTINE TESTING W REFLEX)  RPR  CERVICOVAGINAL ANCILLARY ONLY     EKG   Radiology No results found.  Procedures Procedures (including critical care time)  Medications Ordered in UC Medications - No data to display  Initial Impression / Assessment and Plan / UC Course  I have reviewed the triage vital signs and the nursing notes.  Pertinent labs & imaging results that were available during my care of the patient were reviewed by me and considered in my medical decision making (see chart for details).     Will recheck for STIs per patient request to make sure infections cleared on flagyl. Clindamycin sent for her current labial abscess, continue warm compresses, cleaning with hibiclens, and f/u outpatient with GYN or Gen Surgery for complete excision.   Final Clinical Impressions(s) / UC Diagnoses   Final diagnoses:  Abscess  Acute vaginitis  Routine screening for STI (sexually transmitted infection)   Discharge Instructions   None    ED Prescriptions    Medication Sig Dispense Auth. Provider   clindamycin (CLEOCIN) 150 MG capsule Take 1 capsule (150 mg total) by mouth 2 (two) times daily. 14 capsule Volney American, [place]   chlorhexidine (HIBICLENS) 4 % external liquid Apply topically daily as needed. 120 mL Volney American, [place]     PDMP not reviewed this encounter.   Volney American, [place] 12/03/19 [phone]

## 2019-12-03 NOTE — ED Triage Notes (Signed)
C/O left groin abscess intermittently x months; states has had it incised in past. Also reports being recently treated for trichomonas; would like to have STD screening again.

## 2019-12-04 LAB — CERVICOVAGINAL ANCILLARY ONLY
Bacterial Vaginitis (gardnerella): POSITIVE — AB
Candida Glabrata: NEGATIVE
Candida Vaginitis: NEGATIVE
Chlamydia: NEGATIVE
Comment: NEGATIVE
Comment: NEGATIVE
Comment: NEGATIVE
Comment: NEGATIVE
Comment: NEGATIVE
Comment: NORMAL
Neisseria Gonorrhea: NEGATIVE
Trichomonas: NEGATIVE

## 2019-12-04 LAB — RPR: RPR Ser Ql: NONREACTIVE

## 2019-12-05 ENCOUNTER — Telehealth (HOSPITAL_COMMUNITY): Payer: Self-pay | Admitting: Emergency Medicine

## 2019-12-06 MED ORDER — CLINDAMYCIN HCL 150 MG PO CAPS: 150.0000 mg | ORAL_CAPSULE | Freq: Two times a day (BID) | ORAL | 0 refills | Status: AC

## 2019-12-12 ENCOUNTER — Ambulatory Visit: Payer: 59 | Admitting: Obstetrics & Gynecology

## 2019-12-13 ENCOUNTER — Encounter: Payer: Self-pay | Admitting: Obstetrics & Gynecology

## 2019-12-13 ENCOUNTER — Other Ambulatory Visit: Payer: Self-pay

## 2019-12-13 ENCOUNTER — Ambulatory Visit (INDEPENDENT_AMBULATORY_CARE_PROVIDER_SITE_OTHER): Payer: 59 | Admitting: Obstetrics & Gynecology

## 2019-12-13 VITALS — BP 103/73 | HR 82 | Wt 164.0 lb

## 2019-12-13 DIAGNOSIS — R102 Pelvic and perineal pain: Secondary | ICD-10-CM

## 2019-12-13 DIAGNOSIS — N7011 Chronic salpingitis: Secondary | ICD-10-CM

## 2019-12-13 NOTE — Progress Notes (Signed)
History:  39 y.o. G0P0 here today for f/u of pelvic pain. Pt reports that she has pain about 2x/week. It does not limit her daily activities. She takes Motrin and has relief of sx.  She is worried that the hydrosalpinx could lead to something more severe. The pain is on her right side when it occurs. She also uses a heating pad if she has sx. Pt is here with her mother.    The following portions of the patient's history were reviewed and updated as appropriate: allergies, current medications, past family history, past medical history, past social history, past surgical history and problem list.  Review of Systems:  Pertinent items are noted in HPI.    Objective:  Physical Exam Blood pressure 103/73, pulse 82, weight 164 lb (74.4 kg), last menstrual period 11/26/2019.  CONSTITUTIONAL: Well-developed, well-nourished female in no acute distress.  HENT:  Normocephalic, atraumatic EYES: Conjunctivae and EOM are normal. No scleral icterus.  NECK: Normal range of motion SKIN: Skin is warm and dry. No rash noted. Not diaphoretic.No pallor. [place]: Alert and oriented to person, place, and time. Normal coordination.  Abd: Soft, nontender and nondistended; no rebound or guarding.  Pelvic: Normal appearing external genitalia; normal appearing vaginal mucosa and cervix.  Normal discharge.  Small uterus, no other palpable masses, no uterine or adnexal tenderness  Labs and Imaging 09/25/2019 CLINICAL DATA:  Pelvic pain in a female, history endometriosis, prior RIGHT oophorectomy, septate uterus, RIGHT pelvic mass on prior MR  EXAM: TRANSABDOMINAL AND TRANSVAGINAL ULTRASOUND OF PELVIS  TECHNIQUE: Both transabdominal and transvaginal ultrasound examinations of the pelvis were performed. Transabdominal technique was performed for global imaging of the pelvis including uterus, ovaries, adnexal regions, and pelvic cul-de-sac. It was necessary to proceed with endovaginal exam following the  transabdominal exam to visualize the endometrium and adnexa.  COMPARISON:  Pelvic ultrasound 12/29/2018, MR pelvis 04/11/2019  FINDINGS: Uterus  Measurements: 9.0 x 4.6 x 6.0 cm = volume: 124 mL. Anteverted. Septate morphology. Submucosal leiomyoma at anterior upper uterus 3.0 x 2.7 x 2.4 cm. Additional leiomyomata anterior mid uterus submucosal 2.0 x 1.9 x 1.7 cm and intramural posteriorly mid uterus 0.9 x 0.9 x 0.8 cm.  Endometrium  Thickness: 15 mm. Septate morphology. No endometrial fluid or focal mass  Right ovary  Surgically absent by history  Left ovary  Measurements: 4.5 x 2.5 x 2.6 cm = volume: 10 mL. Normal morphology without mass  Other findings  Tubular cystic structure identified in RIGHT adnexa 11 x 5 x 5 cm, appearance similar to that seen on the prior MR exam though increased in size since prior study. Appearance favors hydrosalpinx. No free pelvic fluid.  IMPRESSION: Septate uterus.  Multiple uterine leiomyomata including 2 submucosal leiomyomata at anterior uterus.  Post RIGHT oophorectomy.  Suspected hydrosalpinx in RIGHT adnexa, slightly increased in size since prior MR exam.   Assessment & Plan:  Right hydrosalpinx. I discussed this case with Dr. Roselie Awkward who reports that introop he felt that this was a hydrosalpinx. Both the [place] and the MR support the dx of hydrosalpinx.  After our prev conversation and review of the images and conversation with Dr. Roselie Awkward, I do not feel that we need to cont to monitor this hydrosalpinx on a regular bases. HOWEVER, I do support following pt closely and adjusting the recommendation if her sx change.   I have discussed this and the risks of surgery in detail with the pt and her mother. She will cont to f/u every 3  months or sooner prn until she is comfortable. We will get further imaging if needed.   Total face-to-face time with patient was 20 min.  Greater than 50% was spent in counseling and  coordination of care with the patient.   [name] L. Harraway-Smith, M.D., Cherlynn June

## 2019-12-25 ENCOUNTER — Other Ambulatory Visit: Payer: Self-pay

## 2019-12-25 ENCOUNTER — Encounter: Payer: Self-pay | Admitting: Family Medicine

## 2019-12-25 ENCOUNTER — Ambulatory Visit (INDEPENDENT_AMBULATORY_CARE_PROVIDER_SITE_OTHER): Payer: 59 | Admitting: Family Medicine

## 2019-12-25 VITALS — BP 110/63 | HR 78 | Ht 62.0 in | Wt 169.2 lb

## 2019-12-25 DIAGNOSIS — Z23 Encounter for immunization: Secondary | ICD-10-CM | POA: Diagnosis not present

## 2019-12-25 DIAGNOSIS — N920 Excessive and frequent menstruation with regular cycle: Secondary | ICD-10-CM | POA: Insufficient documentation

## 2019-12-25 DIAGNOSIS — Z0000 Encounter for general adult medical examination without abnormal findings: Secondary | ICD-10-CM

## 2019-12-25 DIAGNOSIS — Z111 Encounter for screening for respiratory tuberculosis: Secondary | ICD-10-CM

## 2019-12-25 DIAGNOSIS — N760 Acute vaginitis: Secondary | ICD-10-CM | POA: Diagnosis not present

## 2019-12-25 DIAGNOSIS — B9689 Other specified bacterial agents as the cause of diseases classified elsewhere: Secondary | ICD-10-CM

## 2019-12-25 DIAGNOSIS — Z87898 Personal history of other specified conditions: Secondary | ICD-10-CM

## 2019-12-25 DIAGNOSIS — N761 Subacute and chronic vaginitis: Secondary | ICD-10-CM

## 2019-12-25 DIAGNOSIS — F17200 Nicotine dependence, unspecified, uncomplicated: Secondary | ICD-10-CM

## 2019-12-25 HISTORY — DX: Excessive and frequent menstruation with regular cycle: N92.0

## 2019-12-25 MED ORDER — FLUCONAZOLE 150 MG PO TABS: 150.0000 mg | ORAL_TABLET | ORAL | 0 refills | Status: DC

## 2019-12-25 NOTE — Assessment & Plan Note (Addendum)
Flu shot today.  Return for covid booster.  Check screening lipids. Work letter #1 given.  Works two jobs.  Will need a note for school when she gets her TB skin test read.

## 2019-12-25 NOTE — Patient Instructions (Addendum)
Flu shot today. I will call with blood test results. You will need a mammogram next spring. Pap smear next spring. Get your covid booster whenever.  You are due.  Set a nurse visit.   Biggest thing: Set a date to quit smoking.   Come back Weds to get your skin test read.  Get your note for school at that time.

## 2019-12-25 NOTE — Assessment & Plan Note (Signed)
Strongly encouraged to set a quit dates.  States she will quit cold [place] without meds

## 2019-12-25 NOTE — Assessment & Plan Note (Signed)
Check Hgb.  contiue gyn FU.  See [place] next spring for Pap.

## 2019-12-25 NOTE — Assessment & Plan Note (Signed)
Continue annual mammo.  Due next spring.

## 2019-12-25 NOTE — Assessment & Plan Note (Signed)
Treat empirically for candida

## 2019-12-25 NOTE — Progress Notes (Signed)
    SUBJECTIVE:   CHIEF COMPLAINT / HPI:   Physical - needs for work.  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 Issues: 1. No cough, fever or night sweats.  No risk factors for TB.  No previous problem with skin testing. 2. Tobacco smokes ~1/2 ppd.  No quit date yet. 3. Followed by [facility] [place] for tubovarian abscess.  However gets paps here.  Due nex tspring. 4. Due for flu vaccine and covid booster.  She wants to split them up.  Wants flu vaccine today.   5.  Frequent vaginitis.  Recently treated for BV.  Now lots of itching.  She is "certain" that she has another yeast infection.   6.  Getting annual mammos because she felt a problem  Mammo of 05/09/19 was normal.   7. Wants screening cholesterol 8. Very heavy menstural periods.  No recent hgb    OBJECTIVE:   BP 110/63   Pulse 78   Ht 5\' 2"  (1.575 m)   Wt 169 lb 3.2 oz (76.7 kg)   [name] 12/25/2019   SpO2 99%   BMI 30.95 kg/m   Lungs clear Cardiac RRR without m or g   ASSESSMENT/PLAN:   TOBACCO ABUSE Strongly encouraged to set a quit dates.  States she will quit cold [place] without meds   Vaginitis Treat empirically for candida  Preventative health care Flu shot today.  Return for covid booster.  Check screening lipids. Work letter #1 given.  Works two jobs.  Will need a note for school when she gets her TB skin test read.  History of abnormal mammogram Continue annual mammo.  Due next spring.  Menorrhagia Check Hgb.  contiue gyn FU.  See [place] next spring for Pap.     Zenia Resides, MD [place]

## 2019-12-25 NOTE — Progress Notes (Signed)
PPD placed in Rt forearm. Pt tolerated well. Appt made for pt to return to clinic on 12/27/2019 to have site read. Ottis Stain, CMA

## 2019-12-26 LAB — CBC
Hematocrit: 38.5 % (ref 34.0–46.6)
Hemoglobin: 12.5 g/dL (ref 11.1–15.9)
MCH: 28.2 pg (ref 26.6–33.0)
MCHC: 32.5 g/dL (ref 31.5–35.7)
MCV: 87 fL (ref 79–97)
Platelets: 369 10*3/uL (ref 150–450)
RBC: 4.44 x10E6/uL (ref 3.77–5.28)
RDW: 14 % (ref 11.7–15.4)
WBC: 5.3 10*3/uL (ref 3.4–10.8)

## 2019-12-26 LAB — LIPID PANEL
Chol/HDL Ratio: 2.5 ratio (ref 0.0–4.4)
Cholesterol, Total: 139 mg/dL (ref 100–199)
HDL: 55 mg/dL (ref >39)
LDL Chol Calc (NIH): 75 mg/dL (ref 0–99)
Triglycerides: 35 mg/dL (ref 0–149)
VLDL Cholesterol Cal: 9 mg/dL (ref 5–40)

## 2019-12-27 ENCOUNTER — Ambulatory Visit: Payer: 59 | Admitting: Family Medicine

## 2019-12-27 ENCOUNTER — Other Ambulatory Visit: Payer: Self-pay

## 2019-12-27 ENCOUNTER — Ambulatory Visit (INDEPENDENT_AMBULATORY_CARE_PROVIDER_SITE_OTHER): Payer: 59

## 2019-12-27 DIAGNOSIS — Z111 Encounter for screening for respiratory tuberculosis: Secondary | ICD-10-CM

## 2019-12-27 LAB — TB SKIN TEST
Induration: 0 mm
TB Skin Test: NEGATIVE

## 2019-12-27 NOTE — Progress Notes (Signed)
Patient is here for a PPD read.  It was placed on 12/25/2019 in the right forearm @ 1015 am/pm.    PPD RESULTS:  Result: negative Induration: 0 mm  Letter created and given to patient for documentation purposes. Talbot Grumbling, RN

## 2020-01-01 ENCOUNTER — Encounter: Payer: 59 | Admitting: Student in an Organized Health Care Education/Training Program

## 2020-01-08 ENCOUNTER — Encounter (HOSPITAL_COMMUNITY): Payer: Self-pay

## 2020-01-08 ENCOUNTER — Ambulatory Visit (HOSPITAL_COMMUNITY)
Admission: EM | Admit: 2020-01-08 | Discharge: 2020-01-08 | Disposition: A | Discharge status: Home / Self Care | Payer: 59 | Attending: Internal Medicine | Admitting: Internal Medicine

## 2020-01-08 ENCOUNTER — Other Ambulatory Visit: Payer: Self-pay

## 2020-01-08 DIAGNOSIS — Z20822 Contact with and (suspected) exposure to covid-19: Secondary | ICD-10-CM | POA: Insufficient documentation

## 2020-01-08 DIAGNOSIS — J029 Acute pharyngitis, unspecified: Secondary | ICD-10-CM

## 2020-01-08 DIAGNOSIS — F17210 Nicotine dependence, cigarettes, uncomplicated: Secondary | ICD-10-CM | POA: Diagnosis not present

## 2020-01-08 DIAGNOSIS — B9789 Other viral agents as the cause of diseases classified elsewhere: Secondary | ICD-10-CM | POA: Diagnosis not present

## 2020-01-08 DIAGNOSIS — J028 Acute pharyngitis due to other specified organisms: Secondary | ICD-10-CM | POA: Diagnosis not present

## 2020-01-08 DIAGNOSIS — N761 Subacute and chronic vaginitis: Secondary | ICD-10-CM | POA: Insufficient documentation

## 2020-01-08 DIAGNOSIS — Z8616 Personal history of COVID-19: Secondary | ICD-10-CM | POA: Insufficient documentation

## 2020-01-08 LAB — POCT URINALYSIS DIPSTICK, ED / UC
Bilirubin Urine: NEGATIVE
Glucose, UA: NEGATIVE mg/dL
Hgb urine dipstick: NEGATIVE
Ketones, ur: NEGATIVE mg/dL
Leukocytes,Ua: NEGATIVE
Nitrite: NEGATIVE
Protein, ur: NEGATIVE mg/dL
Specific Gravity, Urine: 1.025 (ref 1.005–1.030)
Urobilinogen, UA: 0.2 mg/dL (ref 0.0–1.0)
pH: 8.5 — ABNORMAL HIGH (ref 5.0–8.0)

## 2020-01-08 LAB — CERVICOVAGINAL ANCILLARY ONLY
Bacterial Vaginitis (gardnerella): NEGATIVE
Candida Glabrata: NEGATIVE
Candida Vaginitis: NEGATIVE
Chlamydia: NEGATIVE
Comment: NEGATIVE
Comment: NEGATIVE
Comment: NEGATIVE
Comment: NEGATIVE
Comment: NEGATIVE
Comment: NORMAL
Neisseria Gonorrhea: NEGATIVE
Trichomonas: NEGATIVE

## 2020-01-08 LAB — SARS CORONAVIRUS 2 (TAT 6-24 HRS): SARS Coronavirus 2: NEGATIVE

## 2020-01-08 NOTE — ED Triage Notes (Signed)
Pt presents with sore throat X 3 days.

## 2020-01-08 NOTE — Discharge Instructions (Signed)
Warm salt water gargle Tylenol/Motrin as needed for pain If symptoms worsen please return to the urgent care I will call you with the results if any of the results are significant. You can check your results from [place] as well.

## 2020-01-09 NOTE — ED Provider Notes (Signed)
[name]    CSN: [id number] Arrival date & time: 01/08/20  0800      History   Chief Complaint Chief Complaint  Patient presents with  . Sore Throat    HPI [name] is a 39 y.o. female comes to urgent care with complaints of sore throat of 3 days duration.  Patient denies any loss of taste or smell.  No fever or chills.  No difficulty swallowing.  No shortness of breath, cough or sputum production.  No sick contacts.  No joint aches or headaches.  Patient complains of some vaginal discharge which is nonpruritic now without any foul smell.  No dysuria urgency or frequency.  No lower abdominal pain.  No groin pain no swelling.  Patient would like to be screened for STD. HPI  Past Medical History:  Diagnosis Date  . Endometriosis   . History of 2019 novel coronavirus disease (COVID-19)    pt tested positive 03-03-2019, results in care everywhere.  (05-22-2019 per pt had symptoms of fatigue, headache, and loss of taste/ smell;  fatigue and headache after one week and loss taste/ smell resolved after 3 wks)  . Infertility, female   . Ovarian cyst   . Pelvic mass in female    large fluid filled pelvic mass  . Recurrent boils   . Septate uterus   . Trichomoniasis     Patient Active Problem List   Diagnosis Date Noted  . Vaginitis 12/25/2019  . Menorrhagia 12/25/2019  . Preventative health care 12/25/2019  . GERD (gastroesophageal reflux disease) 06/07/2019  . HSV-2 seropositive 03/07/2019  . Hydrosalpinx 12/30/2018  . Screening examination for STD (sexually transmitted disease) 07/09/2017  . History of abnormal mammogram 01/22/2013  . BV (bacterial vaginosis) 01/18/2012  . Endometriosis 05/01/2011  . SEPTATE UTERUS 07/24/2009  . TOBACCO ABUSE 09/26/2008  . Female infertility 09/26/2008  . IRREGULAR MENSTRUATION 09/21/2006    Past Surgical History:  Procedure Laterality Date  . BREAST BIOPSY Right 1999   per pt benign   . LAPAROSCOPIC  SALPINGOOPHERECTOMY Right 03-25-2010   @[facility]    and LYSIS ADHESIONS  . LAPAROSCOPY N/A 05/30/2019   Procedure: DIAGNOSTIC LAPAROSCOPY;  Surgeon: Woodroe Mode, MD;  Location: [facility];  Service: Gynecology;  Laterality: N/A;    OB History    Gravida  0   Para      Term      Preterm      AB      Living  0     SAB      TAB      Ectopic      Multiple      Live Births               Home Medications    Prior to Admission medications   Medication Sig Start Date End Date Taking? Authorizing Provider  acetic acid 2 % otic solution Place 4 drops into the left ear 3 (three) times daily. 06/15/19   Scot Jun, FNP  Ca Carbonate-Mag Hydroxide (ROLAIDS PO) Take by mouth as needed.    [provider]  cetirizine (ZYRTEC ALLERGY) 10 MG tablet Take 1 tablet (10 mg total) by mouth daily. 10/02/19   Jaynee Eagles, PA-C  ELDERBERRY PO Take by mouth.    [provider]  Multiple Vitamins-Minerals (HAIR SKIN NAILS PO) Take by mouth.    [provider]  Norethindrone Acetate-Ethinyl Estrad-FE (LOESTRIN 24 FE) 1-20 MG-MCG(24) tablet Take 1 tablet  by mouth daily. 06/16/19   Donnamae Jude, MD  ondansetron (ZOFRAN-ODT) 8 MG disintegrating tablet Take 1 tablet (8 mg total) by mouth every 8 (eight) hours as needed for nausea or vomiting. 10/16/19   Jaynee Eagles, PA-C  pantoprazole (PROTONIX) 20 MG tablet Take 20 mg by mouth daily.     [provider]  Probiotic Product (CULTURELLE PROBIOTICS PO) Take by mouth.    [provider]  Norgestimate-Ethinyl Estradiol Triphasic (TRI-SPRINTEC) 0.18/0.215/0.25 MG-35 MCG tablet Take 1 tablet by mouth daily. 11/03/10 05/01/11  Woodroe Mode, MD    Family History Family History  Problem Relation Age of Onset  . Hypertension Mother   . Diabetes Mother   . Cancer Father   . Hypertension Father   . Diabetes Brother   . Hypertension Brother   . Breast cancer Neg Hx     Social  History Social History   Tobacco Use  . Smoking status: Current Every Day Smoker    Packs/day: 0.25    Years: 15.00    Pack years: 3.75    Types: Cigarettes  . Smokeless tobacco: Never Used  Vaping Use  . Vaping Use: Never used  Substance Use Topics  . Alcohol use: Yes    Comment: occasionally  . Drug use: Never     Allergies   Sulfa antibiotics   Review of Systems Review of Systems  HENT: Positive for sore throat. Negative for congestion and voice change.   Respiratory: Negative for cough, chest tightness and shortness of breath.   Genitourinary: Positive for vaginal discharge. Negative for dysuria, frequency, hematuria, menstrual problem, pelvic pain, urgency, vaginal bleeding and vaginal pain.  Musculoskeletal: Negative.   Neurological: Negative.  Negative for dizziness.     Physical Exam Triage Vital Signs ED Triage Vitals  Enc Vitals Group     BP 01/08/20 0820 (!) 131/92     Pulse Rate 01/08/20 0820 89     Resp 01/08/20 0820 17     Temp 01/08/20 0820 98 F (36.7 C)     Temp Source 01/08/20 0820 Oral     SpO2 01/08/20 0820 100 %     Weight --      Height --      Head Circumference --      Peak Flow --      Pain Score 01/08/20 0817 5     Pain Loc --      Pain Edu? --      Excl. in [place]? --    No data found.  Updated Vital Signs BP (!) 131/92 (BP Location: Right Arm)   Pulse 89   Temp 98 F (36.7 C) (Oral)   Resp 17   [name] 12/25/2019   SpO2 100%   Visual Acuity Right Eye Distance:   Left Eye Distance:   Bilateral Distance:    Right Eye Near:   Left Eye Near:    Bilateral Near:     Physical Exam HENT:     Right Ear: Tympanic membrane normal.     Left Ear: Tympanic membrane normal.     Nose: No congestion or rhinorrhea.     Mouth/Throat:     Mouth: Mucous membranes are moist. No oral lesions.     Pharynx: No pharyngeal swelling, oropharyngeal exudate, posterior oropharyngeal erythema or uvula swelling.     Tonsils: No tonsillar exudate or  tonsillar abscesses. 0 on the right. 0 on the left.  Cardiovascular:     Rate and Rhythm: Normal  rate and regular rhythm.  Pulmonary:     Effort: Pulmonary effort is normal. No respiratory distress.     Breath sounds: Normal breath sounds. No wheezing or rhonchi.  Abdominal:     General: Bowel sounds are normal.     Palpations: Abdomen is soft.      UC Treatments / Results  Labs (all labs ordered are listed, but only abnormal results are displayed) Labs Reviewed  POCT URINALYSIS DIPSTICK, ED / UC - Abnormal; Notable for the following components:      Result Value   pH 8.5 (*)    All other components within normal limits  SARS CORONAVIRUS 2 (TAT 6-24 HRS)  CERVICOVAGINAL ANCILLARY ONLY    EKG   Radiology No results found.  Procedures Procedures (including critical care time)  Medications Ordered in UC Medications - No data to display  Initial Impression / Assessment and Plan / UC Course  I have reviewed the triage vital signs and the nursing notes.  Pertinent labs & imaging results that were available during my care of the patient were reviewed by me and considered in my medical decision making (see chart for details).     1.  Acute viral pharyngitis: COVID-19 PCR test sent Warm salt water gargle Push oral fluids Tylenol as needed for pain and/or fever Return precautions given  2.  Subacute vaginitis: Cervical vaginal swab for GC/chlamydia/bacterial vaginosis/trichomonas and vaginal yeast If results are positive, we will call in appropriate medications for the patient Return precautions given. Final Clinical Impressions(s) / UC Diagnoses   Final diagnoses:  Viral pharyngitis  Subacute vaginitis     Discharge Instructions     Warm salt water gargle Tylenol/Motrin as needed for pain If symptoms worsen please return to the urgent care I will call you with the results if any of the results are significant. You can check your results from [place] as  well.   ED Prescriptions    None     PDMP not reviewed this encounter.   Chase Picket, MD 01/09/20 [phone]

## 2020-01-18 ENCOUNTER — Ambulatory Visit (INDEPENDENT_AMBULATORY_CARE_PROVIDER_SITE_OTHER): Payer: 59

## 2020-01-18 ENCOUNTER — Other Ambulatory Visit: Payer: Self-pay

## 2020-01-18 DIAGNOSIS — Z23 Encounter for immunization: Secondary | ICD-10-CM | POA: Diagnosis not present

## 2020-01-18 NOTE — Progress Notes (Signed)
   Covid-19 Vaccination Clinic  Name:  [name]    MRN: [id number] DOB: [birth year]  01/18/2020   Patient presents to nurse clinic for booster COVID vaccination. Patient answers no to all screening questions. Administered in RD, site unremarkable, tolerated injection well.   [name] was observed post Covid-19 immunization for 15 minutes without incident. She was provided with Vaccine Information Sheet and instruction to access the V-Safe system.   [name] was instructed to call 911 with any severe reactions post vaccine: Marland Kitchen Difficulty breathing  . Swelling of face and throat  . A fast heartbeat  . A bad rash all over body  . Dizziness and weakness  Provided patient with updated immunization card.   Patient also has paperwork for new job that needs to be completed. Patient had previous OV with Dr Andria Frames on 11/15. Dr. Andria Frames completed paperwork. Copy made and placed in batch scanning. Obtained release of information from patient in order to fax form to provided number. Patient provided with original copy.   Talbot Grumbling, RN

## 2020-01-30 ENCOUNTER — Ambulatory Visit: Payer: 59 | Admitting: Student in an Organized Health Care Education/Training Program

## 2020-01-31 ENCOUNTER — Encounter: Payer: Self-pay | Admitting: Family Medicine

## 2020-01-31 ENCOUNTER — Other Ambulatory Visit: Payer: Self-pay

## 2020-01-31 ENCOUNTER — Other Ambulatory Visit (HOSPITAL_COMMUNITY)
Admission: RE | Admit: 2020-01-31 | Discharge: 2020-01-31 | Disposition: A | Discharge status: Home / Self Care | Payer: 59 | Source: Ambulatory Visit | Attending: Family Medicine | Admitting: Family Medicine

## 2020-01-31 ENCOUNTER — Ambulatory Visit (INDEPENDENT_AMBULATORY_CARE_PROVIDER_SITE_OTHER): Payer: 59 | Admitting: Family Medicine

## 2020-01-31 VITALS — BP 102/64 | HR 75 | Ht 62.0 in | Wt 166.2 lb

## 2020-01-31 DIAGNOSIS — Z113 Encounter for screening for infections with a predominantly sexual mode of transmission: Secondary | ICD-10-CM | POA: Insufficient documentation

## 2020-01-31 LAB — POCT WET PREP (WET MOUNT)
Clue Cells Wet Prep Whiff POC: NEGATIVE
Trichomonas Wet Prep HPF POC: ABSENT

## 2020-01-31 NOTE — Progress Notes (Signed)
    SUBJECTIVE:   CHIEF COMPLAINT / HPI:   STI Screening: Patient reports that about 4 days ago she developed vaginal discharge that is "tacky, sticky" in nature. Denies any new sexual partners, is not particularly concerned with any sort of STI but would like to be checked out.  Other than occasional cramps denies any pelvic pain, abnormal uterine bleeding, vaginal odor, vaginal itch.  PERTINENT  PMH / PSH:  Patient Active Problem List   Diagnosis Date Noted  . Routine screening for STI (sexually transmitted infection) 01/31/2020  . Vaginitis 12/25/2019  . Menorrhagia 12/25/2019  . Preventative health care 12/25/2019  . GERD (gastroesophageal reflux disease) 06/07/2019  . HSV-2 seropositive 03/07/2019  . Hydrosalpinx 12/30/2018  . Screening examination for STD (sexually transmitted disease) 07/09/2017  . History of abnormal mammogram 01/22/2013  . BV (bacterial vaginosis) 01/18/2012  . Endometriosis 05/01/2011  . SEPTATE UTERUS 07/24/2009  . TOBACCO ABUSE 09/26/2008  . Female infertility 09/26/2008  . IRREGULAR MENSTRUATION 09/21/2006     OBJECTIVE:   BP 102/64   Pulse 75   Ht 5\' 2"  (1.575 m)   Wt 166 lb 4 oz (75.4 kg)   [name] 01/27/2020   SpO2 99%   BMI 30.41 kg/m    Physical exam: General: Well-appearing patient, no apparent distress Respiratory: Comfortable work of breathing, speaking complete sentences GU: Normal-appearing labia minora and majora without lesion, normal-appearing vaginal rugae with some clear/green tented mucus appearing vaginal discharge appreciated, closed cervical os without bleeding and without lesion   ASSESSMENT/PLAN:   Routine screening for STI (sexually transmitted infection) Patient screened for STIs at today's visit. -We will follow-up with results and treat as necessary -Patient declined RPR/HIV blood testing     Daisy Floro, [place]

## 2020-01-31 NOTE — Patient Instructions (Signed)
Thank you for coming in to see [place] today! Please see below to review our plan for today's visit:  We are checking you today for trichomonas, gonorrhea, chlamydia, BV and yeast infection. We will call you with results and treat as needed!  Please call the clinic at [phone] if your symptoms worsen or you have any concerns. It was our pleasure to serve you!   Dr. Milus Banister [facility]

## 2020-01-31 NOTE — Assessment & Plan Note (Signed)
Patient screened for STIs at today's visit. -We will follow-up with results and treat as necessary -Patient declined RPR/HIV blood testing

## 2020-02-05 LAB — CERVICOVAGINAL ANCILLARY ONLY
Chlamydia: NEGATIVE
Comment: NEGATIVE
Comment: NORMAL
Neisseria Gonorrhea: NEGATIVE

## 2020-02-10 ENCOUNTER — Encounter (HOSPITAL_COMMUNITY): Payer: Self-pay | Admitting: Emergency Medicine

## 2020-02-10 ENCOUNTER — Ambulatory Visit (HOSPITAL_COMMUNITY)
Admission: EM | Admit: 2020-02-10 | Discharge: 2020-02-10 | Disposition: A | Discharge status: Home / Self Care | Payer: 59 | Attending: Family Medicine | Admitting: Family Medicine

## 2020-02-10 ENCOUNTER — Other Ambulatory Visit: Payer: Self-pay

## 2020-02-10 DIAGNOSIS — N760 Acute vaginitis: Secondary | ICD-10-CM | POA: Diagnosis present

## 2020-02-10 DIAGNOSIS — B9689 Other specified bacterial agents as the cause of diseases classified elsewhere: Secondary | ICD-10-CM | POA: Diagnosis present

## 2020-02-10 LAB — POCT URINALYSIS DIPSTICK, ED / UC
Bilirubin Urine: NEGATIVE
Glucose, UA: NEGATIVE mg/dL
Hgb urine dipstick: NEGATIVE
Ketones, ur: NEGATIVE mg/dL
Leukocytes,Ua: NEGATIVE
Nitrite: NEGATIVE
Protein, ur: NEGATIVE mg/dL
Specific Gravity, Urine: >=1.03 (ref 1.005–1.030)
Urobilinogen, UA: 0.2 mg/dL (ref 0.0–1.0)
pH: 6 (ref 5.0–8.0)

## 2020-02-10 LAB — POC URINE PREG, ED: Preg Test, Ur: NEGATIVE

## 2020-02-10 MED ORDER — FLUCONAZOLE 150 MG PO TABS: 150.0000 mg | ORAL_TABLET | Freq: Every day | ORAL | 0 refills | Status: DC

## 2020-02-10 MED ORDER — METRONIDAZOLE 1 % EX GEL: Freq: Every day | CUTANEOUS | 0 refills | Status: DC

## 2020-02-10 NOTE — ED Provider Notes (Signed)
Ken Caryl    CSN: [id number] Arrival date & time: 02/10/20  1008      History   Chief Complaint Chief Complaint  Patient presents with  . Vaginal Discharge    HPI [name] is a 40 y.o. female presenting with vaginal discharge and irritation. History of endometriosis, infertility, ovarian cyst, pelvic mass, septate uterus, menorrhagia, GERD. Presenting today with vaginal discharge and irritation x4 days, as well as abd pain. Describes the discharge as thin, white, and fishy smelling. Irritation and itching inside and outside vagina. occ lower abd cramps, though pt states she has endometriosis so this could be the reason. Her period is due in 7 days. Denies hematuria, dysuria, frequency, urgency, back pain, n/v/d, fevers/chills. She hasn't had any new partners in the last 10 days since her last STI panel.  HPI  Past Medical History:  Diagnosis Date  . Endometriosis   . History of 2019 novel coronavirus disease (COVID-19)    pt tested positive 03-03-2019, results in care everywhere.  (05-22-2019 per pt had symptoms of fatigue, headache, and loss of taste/ smell;  fatigue and headache after one week and loss taste/ smell resolved after 3 wks)  . Infertility, female   . Ovarian cyst   . Pelvic mass in female    large fluid filled pelvic mass  . Recurrent boils   . Septate uterus   . Trichomoniasis     Patient Active Problem List   Diagnosis Date Noted  . Routine screening for STI (sexually transmitted infection) 01/31/2020  . Vaginitis 12/25/2019  . Menorrhagia 12/25/2019  . Preventative health care 12/25/2019  . GERD (gastroesophageal reflux disease) 06/07/2019  . HSV-2 seropositive 03/07/2019  . Hydrosalpinx 12/30/2018  . Screening examination for STD (sexually transmitted disease) 07/09/2017  . History of abnormal mammogram 01/22/2013  . BV (bacterial vaginosis) 01/18/2012  . Endometriosis 05/01/2011  . SEPTATE UTERUS 07/24/2009  . TOBACCO ABUSE  09/26/2008  . Female infertility 09/26/2008  . IRREGULAR MENSTRUATION 09/21/2006    Past Surgical History:  Procedure Laterality Date  . BREAST BIOPSY Right 1999   per pt benign   . LAPAROSCOPIC SALPINGOOPHERECTOMY Right 03-25-2010   @[facility]    and LYSIS ADHESIONS  . LAPAROSCOPY N/A 05/30/2019   Procedure: DIAGNOSTIC LAPAROSCOPY;  Surgeon: Woodroe Mode, MD;  Location: [facility];  Service: Gynecology;  Laterality: N/A;    OB History    Gravida  0   Para      Term      Preterm      AB      Living  0     SAB      IAB      Ectopic      Multiple      Live Births               Home Medications    Prior to Admission medications   Medication Sig Start Date End Date Taking? Authorizing Provider  fluconazole (DIFLUCAN) 150 MG tablet Take 1 tablet (150 mg total) by mouth daily. 02/10/20  Yes Hazel Sams, PA-C  metroNIDAZOLE (METROGEL) 1 % gel Apply topically daily. 02/10/20  Yes Hazel Sams, PA-C  acetic acid 2 % otic solution Place 4 drops into the left ear 3 (three) times daily. 06/15/19   Scot Jun, FNP  Ca Carbonate-Mag Hydroxide (ROLAIDS PO) Take by mouth as needed.    [provider]  cetirizine (ZYRTEC ALLERGY) 10 MG tablet Take 1  tablet (10 mg total) by mouth daily. 10/02/19   Jaynee Eagles, PA-C  ELDERBERRY PO Take by mouth.    [provider]  Multiple Vitamins-Minerals (HAIR SKIN NAILS PO) Take by mouth.    [provider]  Norethindrone Acetate-Ethinyl Estrad-FE (LOESTRIN 24 FE) 1-20 MG-MCG(24) tablet Take 1 tablet by mouth daily. 06/16/19   Donnamae Jude, MD  ondansetron (ZOFRAN-ODT) 8 MG disintegrating tablet Take 1 tablet (8 mg total) by mouth every 8 (eight) hours as needed for nausea or vomiting. 10/16/19   Jaynee Eagles, PA-C  pantoprazole (PROTONIX) 20 MG tablet Take 20 mg by mouth daily.     [provider]  Probiotic Product (CULTURELLE PROBIOTICS PO) Take by mouth.    [provider]   Norgestimate-Ethinyl Estradiol Triphasic (TRI-SPRINTEC) 0.18/0.215/0.25 MG-35 MCG tablet Take 1 tablet by mouth daily. 11/03/10 05/01/11  Woodroe Mode, MD    Family History Family History  Problem Relation Age of Onset  . Hypertension Mother   . Diabetes Mother   . Cancer Father   . Hypertension Father   . Diabetes Brother   . Hypertension Brother   . Breast cancer Neg Hx     Social History Social History   Tobacco Use  . Smoking status: Current Every Day Smoker    Packs/day: 0.25    Years: 15.00    Pack years: 3.75    Types: Cigarettes  . Smokeless tobacco: Never Used  Vaping Use  . Vaping Use: Never used  Substance Use Topics  . Alcohol use: Yes    Comment: occasionally  . Drug use: Never     Allergies   Sulfa antibiotics   Review of Systems Review of Systems  Constitutional: Negative for chills and fever.  HENT: Negative for sore throat.   Eyes: Negative for pain and redness.  Respiratory: Negative for shortness of breath.   Cardiovascular: Negative for chest pain.  Gastrointestinal: Positive for abdominal pain. Negative for diarrhea, nausea and vomiting.  Genitourinary: Positive for vaginal discharge. Negative for decreased urine volume, difficulty urinating, dysuria, flank pain, frequency, genital sores, hematuria, pelvic pain, urgency, vaginal bleeding and vaginal pain.  Musculoskeletal: Negative for back pain.  Skin: Negative for rash.     Physical Exam Triage Vital Signs ED Triage Vitals  Enc Vitals Group     BP 02/10/20 1048 96/68     Pulse Rate 02/10/20 1048 70     Resp 02/10/20 1048 16     Temp 02/10/20 1048 98.3 F (36.8 C)     Temp Source 02/10/20 1048 Oral     SpO2 02/10/20 1048 100 %     Weight --      Height --      Head Circumference --      Peak Flow --      Pain Score 02/10/20 1047 4     Pain Loc --      Pain Edu? --      Excl. in [place]? --    No data found.  Updated Vital Signs BP 96/68 (BP Location: Right Arm)   Pulse 70    Temp 98.3 F (36.8 C) (Oral)   Resp 16   [name] 01/27/2020   SpO2 100%   Visual Acuity Right Eye Distance:   Left Eye Distance:   Bilateral Distance:    Right Eye Near:   Left Eye Near:    Bilateral Near:     Physical Exam Vitals reviewed. Exam conducted with a chaperone present.  Constitutional:      General: She is not in acute distress.    Appearance: Normal appearance. She is not ill-appearing.  HENT:     Head: Normocephalic and atraumatic.  Cardiovascular:     Rate and Rhythm: Normal rate and regular rhythm.     Heart sounds: Normal heart sounds.  Pulmonary:     Effort: Pulmonary effort is normal.     Breath sounds: Normal breath sounds.  Abdominal:     General: Bowel sounds are normal. There is no distension.     Palpations: Abdomen is soft.     Tenderness: There is no abdominal tenderness. There is no right CVA tenderness, left CVA tenderness, guarding or rebound. Negative signs include Murphy's sign and McBurney's sign.  Genitourinary:    General: Normal vulva.     Pubic Area: No rash or pubic lice.      Labia:        Right: No rash, tenderness, lesion or injury.        Left: No rash, tenderness, lesion or injury.      Urethra: No prolapse, urethral pain or urethral swelling.     Vagina: No signs of injury and foreign body. Vaginal discharge present. No erythema, tenderness, bleeding, lesions or prolapsed vaginal walls.     Cervix: No cervical motion tenderness, discharge, friability, lesion, erythema or cervical bleeding.     Uterus: Normal. Not enlarged and not tender.      Adnexa: Right adnexa normal and left adnexa normal.       Right: No mass, tenderness or fullness.         Left: No mass, tenderness or fullness.       Comments: Thick white discharge visible in vaginal vault Two 65mm smooth erythematous bumps visible just inside labia majora Neurological:     General: No focal deficit present.     Mental Status: She is alert and oriented to person, place,  and time.  Psychiatric:        Mood and Affect: Mood normal.        Behavior: Behavior normal.        Thought Content: Thought content normal.        Judgment: Judgment normal.      UC Treatments / Results  Labs (all labs ordered are listed, but only abnormal results are displayed) Labs Reviewed  POCT URINALYSIS DIPSTICK, ED / UC  POC URINE PREG, ED  CERVICOVAGINAL ANCILLARY ONLY    EKG   Radiology No results found.  Procedures Procedures (including critical care time)  Medications Ordered in UC Medications - No data to display  Initial Impression / Assessment and Plan / UC Course  I have reviewed the triage vital signs and the nursing notes.  Pertinent labs & imaging results that were available during my care of the patient were reviewed by me and considered in my medical decision making (see chart for details).     Urine pregnancy negative, UA wnl today. STI panel was negative for G/C, trich, yeast, BV 10 days ago (01/31/2020). No new partners since that time but pt states she has concerns about STI exposure via current partner. Will send for G/C, trich, yeast, BV testing, HSV. Plan to treat for BV today based on exam and history of BV in the past. She reqeusts flagyl gel instead of oral medication. Diflucan sent at pt request- she reports BV treatment causing yeast infection in the past. Declines HIV and syphillis testing.   We have  sent testing for sexually transmitted infections. We will notify you of any positive results once they are received. If required, we will prescribe any medications you might need.   Please refrain from all sexual activity for at least the next seven days.  Seek additional medical attention if you develop fevers/chills, new/worsening abdominal pain, new/worsening vaginal discomfort/discharge, etc. Patient verbalizes understanding and agreement.   Final Clinical Impressions(s) / UC Diagnoses   Final diagnoses:  BV (bacterial vaginosis)      Discharge Instructions     -Start the Metronidazole gel. Use this 1-2x daily outside and inside the vagina. -Take Diflucan pill if symptoms worsen or persist, despite treating the BV -We'll call you if the result of any of your labs is abnormal.    ED Prescriptions    Medication Sig Dispense Auth. Provider   metroNIDAZOLE (METROGEL) 1 % gel Apply topically daily. 45 g Hazel Sams, PA-C   fluconazole (DIFLUCAN) 150 MG tablet Take 1 tablet (150 mg total) by mouth daily. 1 tablet Hazel Sams, PA-C     PDMP not reviewed this encounter.   Hazel Sams, PA-C 02/10/20 1136

## 2020-02-10 NOTE — Discharge Instructions (Signed)
-  Start the Metronidazole gel. Use this 1-2x daily outside and inside the vagina. -Take Diflucan pill if symptoms worsen or persist, despite treating the BV -We'll call you if the result of any of your labs is abnormal.

## 2020-02-10 NOTE — ED Triage Notes (Signed)
PT C/O: here for vag d/c and irriation onset 4 days associated w/abd pain  Sexually active w/1 female partner  DENIES: f/v/n/d  TAKING MEDS: OTC Ibuprofen.   A&O x4... NAD... Ambulatory

## 2020-02-12 LAB — CERVICOVAGINAL ANCILLARY ONLY
Bacterial Vaginitis (gardnerella): NEGATIVE
Candida Glabrata: NEGATIVE
Candida Vaginitis: NEGATIVE
Chlamydia: NEGATIVE
Comment: NEGATIVE
Comment: NEGATIVE
Comment: NEGATIVE
Comment: NEGATIVE
Comment: NEGATIVE
Comment: NORMAL
Neisseria Gonorrhea: NEGATIVE
Trichomonas: NEGATIVE

## 2020-02-12 LAB — HSV CULTURE AND TYPING

## 2020-02-28 ENCOUNTER — Telehealth: Payer: Self-pay | Admitting: General Practice

## 2020-02-28 NOTE — Telephone Encounter (Signed)
Left message on VM informing pt to give our office a call to schedule follow up visit.

## 2020-03-05 ENCOUNTER — Other Ambulatory Visit: Payer: Self-pay

## 2020-03-05 ENCOUNTER — Encounter (HOSPITAL_COMMUNITY): Payer: Self-pay | Admitting: Emergency Medicine

## 2020-03-05 ENCOUNTER — Ambulatory Visit (HOSPITAL_COMMUNITY)
Admission: EM | Admit: 2020-03-05 | Discharge: 2020-03-05 | Disposition: A | Discharge status: Home / Self Care | Payer: 59 | Attending: Emergency Medicine | Admitting: Emergency Medicine

## 2020-03-05 DIAGNOSIS — B349 Viral infection, unspecified: Secondary | ICD-10-CM | POA: Diagnosis present

## 2020-03-05 DIAGNOSIS — Z20822 Contact with and (suspected) exposure to covid-19: Secondary | ICD-10-CM | POA: Diagnosis not present

## 2020-03-05 DIAGNOSIS — F17210 Nicotine dependence, cigarettes, uncomplicated: Secondary | ICD-10-CM | POA: Insufficient documentation

## 2020-03-05 DIAGNOSIS — N898 Other specified noninflammatory disorders of vagina: Secondary | ICD-10-CM | POA: Diagnosis not present

## 2020-03-05 LAB — SARS CORONAVIRUS 2 (TAT 6-24 HRS): SARS Coronavirus 2: NEGATIVE

## 2020-03-05 NOTE — ED Triage Notes (Signed)
Pt presents with scratchy throat, headache, and body aches xs 2 days. States multiple people at work have had COVID.   Pt also would like STD check, c/o lower abdominal pain and vaginal discharge.

## 2020-03-05 NOTE — ED Provider Notes (Signed)
[place]    CSN: [id number] Arrival date & time: 03/05/20  3903      History   Chief Complaint Chief Complaint  Patient presents with  . Covid Exposure  . Headache  . Vaginal Discharge    HPI [name] is a 40 y.o. female.   Patient presents with 2-day history of body aches, headache, scratchy throat.  She requests a COVID test because she has coworkers who are COVID positive.  She also reports scant white vaginal discharge and would like testing for STDs.  She denies fever, chills, cough, shortness of breath, abdominal pain, pelvic pain, or other symptoms.  Her medical history includes bacterial vaginosis, trichomoniasis, HSV, endometriosis, GERD, tobacco abuse.  The history is provided by the patient and medical records.    Past Medical History:  Diagnosis Date  . Endometriosis   . History of 2019 novel coronavirus disease (COVID-19)    pt tested positive 03-03-2019, results in care everywhere.  (05-22-2019 per pt had symptoms of fatigue, headache, and loss of taste/ smell;  fatigue and headache after one week and loss taste/ smell resolved after 3 wks)  . Infertility, female   . Ovarian cyst   . Pelvic mass in female    large fluid filled pelvic mass  . Recurrent boils   . Septate uterus   . Trichomoniasis     Patient Active Problem List   Diagnosis Date Noted  . Routine screening for STI (sexually transmitted infection) 01/31/2020  . Vaginitis 12/25/2019  . Menorrhagia 12/25/2019  . Preventative health care 12/25/2019  . GERD (gastroesophageal reflux disease) 06/07/2019  . HSV-2 seropositive 03/07/2019  . Hydrosalpinx 12/30/2018  . Screening examination for STD (sexually transmitted disease) 07/09/2017  . History of abnormal mammogram 01/22/2013  . BV (bacterial vaginosis) 01/18/2012  . Endometriosis 05/01/2011  . SEPTATE UTERUS 07/24/2009  . TOBACCO ABUSE 09/26/2008  . Female infertility 09/26/2008  . IRREGULAR MENSTRUATION 09/21/2006     Past Surgical History:  Procedure Laterality Date  . BREAST BIOPSY Right 1999   per pt benign   . LAPAROSCOPIC SALPINGOOPHERECTOMY Right 03-25-2010   @[facility]    and LYSIS ADHESIONS  . LAPAROSCOPY N/A 05/30/2019   Procedure: DIAGNOSTIC LAPAROSCOPY;  Surgeon: Woodroe Mode, MD;  Location[facility];  Service: Gynecology;  Laterality: N/A;    OB History    Gravida  0   Para      Term      Preterm      AB      Living  0     SAB      IAB      Ectopic      Multiple      Live Births               Home Medications    Prior to Admission medications   Medication Sig Start Date End Date Taking? Authorizing Provider  acetic acid 2 % otic solution Place 4 drops into the left ear 3 (three) times daily. 06/15/19   Scot Jun, FNP  Ca Carbonate-Mag Hydroxide (ROLAIDS PO) Take by mouth as needed.    [provider]  cetirizine (ZYRTEC ALLERGY) 10 MG tablet Take 1 tablet (10 mg total) by mouth daily. 10/02/19   Jaynee Eagles, PA-C  ELDERBERRY PO Take by mouth.    [provider]  fluconazole (DIFLUCAN) 150 MG tablet Take 1 tablet (150 mg total) by mouth daily. 02/10/20   Hazel Sams,  PA-C  metroNIDAZOLE (METROGEL) 1 % gel Apply topically daily. 02/10/20   Hazel Sams, PA-C  Multiple Vitamins-Minerals (HAIR SKIN NAILS PO) Take by mouth.    [provider]  Norethindrone Acetate-Ethinyl Estrad-FE (LOESTRIN 24 FE) 1-20 MG-MCG(24) tablet Take 1 tablet by mouth daily. 06/16/19   Donnamae Jude, MD  ondansetron (ZOFRAN-ODT) 8 MG disintegrating tablet Take 1 tablet (8 mg total) by mouth every 8 (eight) hours as needed for nausea or vomiting. 10/16/19   Jaynee Eagles, PA-C  pantoprazole (PROTONIX) 20 MG tablet Take 20 mg by mouth daily.     [provider]  Probiotic Product (CULTURELLE PROBIOTICS PO) Take by mouth.    [provider]  Norgestimate-Ethinyl Estradiol Triphasic (TRI-SPRINTEC) 0.18/0.215/0.25 MG-35 MCG tablet  Take 1 tablet by mouth daily. 11/03/10 05/01/11  Woodroe Mode, MD    Family History Family History  Problem Relation Age of Onset  . Hypertension Mother   . Diabetes Mother   . Cancer Father   . Hypertension Father   . Diabetes Brother   . Hypertension Brother   . Breast cancer Neg Hx     Social History Social History   Tobacco Use  . Smoking status: Current Every Day Smoker    Packs/day: 0.25    Years: 15.00    Pack years: 3.75    Types: Cigarettes  . Smokeless tobacco: Never Used  Vaping Use  . Vaping Use: Never used  Substance Use Topics  . Alcohol use: Yes    Comment: occasionally  . Drug use: Never     Allergies   Sulfa antibiotics   Review of Systems Review of Systems  Constitutional: Negative for chills and fever.  HENT: Positive for sore throat. Negative for ear pain.   Eyes: Negative for pain and visual disturbance.  Respiratory: Negative for cough and shortness of breath.   Cardiovascular: Negative for chest pain and palpitations.  Gastrointestinal: Negative for abdominal pain and vomiting.  Genitourinary: Positive for vaginal discharge. Negative for dysuria and hematuria.  Musculoskeletal: Positive for myalgias. Negative for arthralgias and back pain.  Skin: Negative for color change and rash.  Neurological: Positive for headaches. Negative for dizziness, seizures and syncope.  All other systems reviewed and are negative.    Physical Exam Triage Vital Signs ED Triage Vitals  Enc Vitals Group     BP      Pulse      Resp      Temp      Temp src      SpO2      Weight      Height      Head Circumference      Peak Flow      Pain Score      Pain Loc      Pain Edu?      Excl. in [place]?    No data found.  Updated Vital Signs BP 119/83 (BP Location: Right Arm)   Pulse (!) 101   Temp 99 F (37.2 C) (Oral)   Resp 18   [name] 01/28/2020   SpO2 99%   Visual Acuity Right Eye Distance:   Left Eye Distance:   Bilateral Distance:    Right  Eye Near:   Left Eye Near:    Bilateral Near:     Physical Exam Vitals and nursing note reviewed.  Constitutional:      General: She is not in acute distress.    Appearance: She is well-developed and well-nourished.  She is not ill-appearing.  HENT:     Head: Normocephalic and atraumatic.     Right Ear: Tympanic membrane normal.     Left Ear: Tympanic membrane normal.     Nose: Nose normal.     Mouth/Throat:     Mouth: Mucous membranes are moist.     Pharynx: Oropharynx is clear.  Eyes:     Conjunctiva/sclera: Conjunctivae normal.  Cardiovascular:     Rate and Rhythm: Normal rate and regular rhythm.     Heart sounds: Normal heart sounds.  Pulmonary:     Effort: Pulmonary effort is normal. No respiratory distress.     Breath sounds: Normal breath sounds.  Abdominal:     General: Bowel sounds are normal.     Palpations: Abdomen is soft.     Tenderness: There is no abdominal tenderness. There is no right CVA tenderness, left CVA tenderness, guarding or rebound.  Musculoskeletal:        General: No edema.     Cervical back: Neck supple.  Skin:    General: Skin is warm and dry.     Findings: No rash.  Neurological:     General: No focal deficit present.     Mental Status: She is alert and oriented to person, place, and time.     Gait: Gait normal.  Psychiatric:        Mood and Affect: Mood and affect and mood normal.        Behavior: Behavior normal.      UC Treatments / Results  Labs (all labs ordered are listed, but only abnormal results are displayed) Labs Reviewed  SARS CORONAVIRUS 2 (TAT 6-24 HRS)  CERVICOVAGINAL ANCILLARY ONLY    EKG   Radiology No results found.  Procedures Procedures (including critical care time)  Medications Ordered in UC Medications - No data to display  Initial Impression / Assessment and Plan / UC Course  I have reviewed the triage vital signs and the nursing notes.  Pertinent labs & imaging results that were available  during my care of the patient were reviewed by me and considered in my medical decision making (see chart for details).   Viral illness, Vaginal discharge.  COVID pending.  Instructed patient to self quarantine until the test results are back.  Discussed symptomatic treatment including Tylenol, rest, hydration.  Patient obtained vaginal self swab for testing.  Discussed with patient that she may require treatment if her test results are positive.  Discussed that her sexual partner may also require treatment.  Instructed her to abstain from sexual activity until her test results are back.  Instructed her to follow-up with her PCP or OB/GYN if her symptoms are not improving.  Patient agrees to plan of care.      Final Clinical Impressions(s) / UC Diagnoses   Final diagnoses:  Viral illness  Vaginal discharge     Discharge Instructions     Your COVID test is pending.  You should self quarantine until the test result is back.    Take Tylenol or ibuprofen as needed for fever or discomfort.  Rest and keep yourself hydrated.    Your vaginal tests are pending.  If your test results are positive, we will call you.  You and your sexual partner(s) may require treatment at that time.  Do not have sexual activity until the test results are back.  Follow up with your primary care provider if your symptoms are not improving.  ED Prescriptions    None     PDMP not reviewed this encounter.   Sharion Balloon, NP 03/05/20 [phone]

## 2020-03-05 NOTE — Discharge Instructions (Addendum)
Your COVID test is pending.  You should self quarantine until the test result is back.    Take Tylenol or ibuprofen as needed for fever or discomfort.  Rest and keep yourself hydrated.    Your vaginal tests are pending.  If your test results are positive, we will call you.  You and your sexual partner(s) may require treatment at that time.  Do not have sexual activity until the test results are back.  Follow up with your primary care provider if your symptoms are not improving.

## 2020-03-06 LAB — CERVICOVAGINAL ANCILLARY ONLY
Bacterial Vaginitis (gardnerella): NEGATIVE
Candida Glabrata: NEGATIVE
Candida Vaginitis: NEGATIVE
Chlamydia: NEGATIVE
Comment: NEGATIVE
Comment: NEGATIVE
Comment: NEGATIVE
Comment: NEGATIVE
Comment: NEGATIVE
Comment: NORMAL
Neisseria Gonorrhea: NEGATIVE
Trichomonas: NEGATIVE

## 2020-03-06 LAB — POCT URINALYSIS DIPSTICK, ED / UC
Bilirubin Urine: NEGATIVE
Glucose, UA: NEGATIVE mg/dL
Hgb urine dipstick: NEGATIVE
Ketones, ur: 15 mg/dL — AB
Leukocytes,Ua: NEGATIVE
Nitrite: NEGATIVE
Protein, ur: 100 mg/dL — AB
Specific Gravity, Urine: 1.025 (ref 1.005–1.030)
Urobilinogen, UA: 1 mg/dL (ref 0.0–1.0)
pH: 6.5 (ref 5.0–8.0)

## 2020-03-06 LAB — POC URINE PREG, ED: Preg Test, Ur: NEGATIVE

## 2020-03-21 ENCOUNTER — Ambulatory Visit (INDEPENDENT_AMBULATORY_CARE_PROVIDER_SITE_OTHER): Payer: 59 | Admitting: Obstetrics & Gynecology

## 2020-03-21 ENCOUNTER — Other Ambulatory Visit: Payer: Self-pay

## 2020-03-21 ENCOUNTER — Encounter: Payer: Self-pay | Admitting: Obstetrics & Gynecology

## 2020-03-21 VITALS — BP 105/72 | HR 67 | Wt 169.0 lb

## 2020-03-21 DIAGNOSIS — R102 Pelvic and perineal pain: Secondary | ICD-10-CM | POA: Diagnosis not present

## 2020-03-21 DIAGNOSIS — N809 Endometriosis, unspecified: Secondary | ICD-10-CM

## 2020-03-21 DIAGNOSIS — N7011 Chronic salpingitis: Secondary | ICD-10-CM | POA: Diagnosis not present

## 2020-03-21 MED ORDER — NORETHIN ACE-ETH ESTRAD-FE 1-20 MG-MCG(24) PO TABS: 1.0000 | ORAL_TABLET | Freq: Every day | ORAL | 11 refills | Status: DC

## 2020-03-21 NOTE — Progress Notes (Signed)
History:  40 y.o. G0P0 here today for f/u of pelvic pain. Pt reports that the pain is intermittent. When it does occur, she takes a motrin or uses a heating pad with relief of sx. It does not keep her from her ADLs. Pt does report pain with her cycles. She also reports heavy bleeding that sometimes runs down her legs. She reports that she goes through 1/2 box of sanitary napkins when on her menses.  She also reports occ dizziness with menses.    The following portions of the patient's history were reviewed and updated as appropriate: allergies, current medications, past family history, past medical history, past social history, past surgical history and problem list.  Review of Systems:  Pertinent items are noted in HPI.    Objective:  Physical Exam Blood pressure 105/72, pulse 67, weight 169 lb 0.6 oz (76.7 kg), last menstrual period 03/13/2020.  CONSTITUTIONAL: Well-developed, well-nourished female in no acute distress.  HENT:  Normocephalic, atraumatic EYES: Conjunctivae and EOM are normal. No scleral icterus.  NECK: Normal range of motion SKIN: Skin is warm and dry. No rash noted. Not diaphoretic.No pallor. [place]: Alert and oriented to person, place, and time. Normal coordination.  Abd: Soft, nontender and nondistended Pelvic: not done  Labs and Imaging CBC CBC Latest Ref Rng & Units 12/25/2019 05/13/2019 03/17/2019  WBC 3.4 - 10.8 x10E3/uL 5.3 5.4 6.2  Hemoglobin 11.1 - 15.9 g/dL 12.5 12.3 12.0  Hematocrit 34.0 - 46.6 % 38.5 37.8 37.1  Platelets 150 - 450 x10E3/uL [phone]    Assessment & Plan:  Pelvic pain- stable I have discussed with pt that her [place] suggests hydrosalpinx. Pt wants [place] every month so that she knows what is going on in her body. I have explained to her that I am not comfortable ordering [place] to check her fibrodis and hydrosalpinx every 3 months. Pt was not happy with my assessment. I have offered her another opinion however, she was referred to me. I offered her  to follow up with her prev provider since surgery isn't recommended.  Menorrhagia Hgb stable.  Discussed with pt treatment options. She agrees to starting OCPs.  LoEstrin 1/20 1 po q day rec f/u in 3 months or sooner prn  Total face-to-face time with patient was 30 min.  Greater than 50% was spent in counseling and coordination of care with the patient.  Duong Haydel L. Harraway-Smith, M.D., Cherlynn June

## 2020-04-08 ENCOUNTER — Other Ambulatory Visit: Payer: Self-pay | Admitting: Family Medicine

## 2020-04-08 DIAGNOSIS — Z1231 Encounter for screening mammogram for malignant neoplasm of breast: Secondary | ICD-10-CM

## 2020-04-09 ENCOUNTER — Ambulatory Visit: Payer: 59 | Admitting: Student in an Organized Health Care Education/Training Program

## 2020-04-15 ENCOUNTER — Ambulatory Visit: Payer: 59 | Admitting: Student in an Organized Health Care Education/Training Program

## 2020-04-15 ENCOUNTER — Ambulatory Visit (INDEPENDENT_AMBULATORY_CARE_PROVIDER_SITE_OTHER): Payer: 59 | Admitting: Family Medicine

## 2020-04-15 ENCOUNTER — Other Ambulatory Visit: Payer: Self-pay

## 2020-04-15 ENCOUNTER — Other Ambulatory Visit (HOSPITAL_COMMUNITY)
Admission: RE | Admit: 2020-04-15 | Discharge: 2020-04-15 | Disposition: A | Discharge status: Home / Self Care | Payer: 59 | Source: Ambulatory Visit | Attending: Family Medicine | Admitting: Family Medicine

## 2020-04-15 ENCOUNTER — Encounter: Payer: Self-pay | Admitting: Family Medicine

## 2020-04-15 VITALS — BP 102/68 | HR 86 | Ht 62.0 in | Wt 165.2 lb

## 2020-04-15 DIAGNOSIS — R29898 Other symptoms and signs involving the musculoskeletal system: Secondary | ICD-10-CM | POA: Diagnosis not present

## 2020-04-15 DIAGNOSIS — Z124 Encounter for screening for malignant neoplasm of cervix: Secondary | ICD-10-CM | POA: Diagnosis present

## 2020-04-15 DIAGNOSIS — Z113 Encounter for screening for infections with a predominantly sexual mode of transmission: Secondary | ICD-10-CM

## 2020-04-15 LAB — POCT WET PREP (WET MOUNT)
Clue Cells Wet Prep Whiff POC: NEGATIVE
Trichomonas Wet Prep HPF POC: ABSENT

## 2020-04-15 NOTE — Patient Instructions (Signed)
Wonderful to see you!   Keep use pain cream up to 4 times daily on your hand, also try ice 20 min at a time several times a day. I encourage you to keep wearing the wrist brace when you can.   I will place a referral to sports medicine, you should hear from them in the next week or call in the next several days   [address] [place], [place] [address] Phone: [phone]

## 2020-04-15 NOTE — Progress Notes (Signed)
    SUBJECTIVE:   CHIEF COMPLAINT / HPI: papsmear   [name] is a 40 year old female presenting for her papsmear.   Her last was 04/28/2017 with normal cytology and HPV negative. No previous abnormal papsmears. No known family history of breast, uterine, or cervical cancers.  She would also like a full STD screen, however denies any concerning symptoms including vaginal itchiness/irritation, rash, change in discharge.  She is sexually active.  Not currently using any contraception, prescribed Loestrin OCPs for menstrual irregularity but has not started yet.  She also reports a several month history of a bump on the dorsal part of her left hand.  It is achy mainly in the morning, making it stiff and will swell a little bit in this area as well.  She feels like the area has gotten bigger and more sore throughout the day.  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  She has been using some Aspercreme with relief for about 10 minutes or so and a wrist brace, however cannot wear it at work.  No preceding injury or trauma.  No overlying rash or redness.  Denies any numbness, weakness.  PERTINENT  PMH / PSH: History of BV, hydrosalpinx, septate uterus, endometriosis, tobacco use approximately 7 cigarettes daily  OBJECTIVE:   BP 102/68   Pulse 86   Ht 5\' 2"  (1.575 m)   Wt 165 lb 3.2 oz (74.9 kg)   [name] 04/07/2020 (Exact Date)   SpO2 100%   BMI 30.22 kg/m   General: Alert, NAD HEENT: NCAT, MMM Lungs: No increased WOB  Abdomen: soft, non-tender Pelvic exam: VULVA: normal appearing vulva with no masses, tenderness or lesions, VAGINA: normal appearing vagina with normal color, copious white discharge, no lesions, CERVIX: normal appearing cervix without lesions, cervical motion tenderness absent. Msk: Approximate 1 cm hard semimobile area present on dorsal left hand around central carpal radial joint, tender to palpation.  Some soft tissue swelling overlying however without any erythema,  rash, or warmth to touch.  5/5 strength through left fingers/wrist.  Sensation to light touch intact throughout bilateral hand/upper extremity.  Radial pulses palpable bilaterally.  ASSESSMENT/PLAN:   Encounter for Papanicolaou smear for cervical cancer screening Pap smear with HPV performed.  No previous history of abnormal Paps.  Screening examination for STD (sexually transmitted disease) Asymptomatic.  GC/CH/trich, wet prep, HIV, hep C, and RPR collected.  Will treat accordingly as needed, encouraged adequate contraception with condom use to prevent STDs.  Hand problems Region on dorsal left hand likely ganglion cyst.  Recommended continued use of wrist brace, activity modification, Aspercreme/Voltaren gel, and ice frequently throughout the day.  Given significant patient frustration due to constant aching, after discussion with Dr. Garlan Fillers, placed referral to sports medicine for ultrasound of region +/- aspiration if appropriate.     Follow-up if not improving or sooner if worsening.  [name], [place]

## 2020-04-15 NOTE — Assessment & Plan Note (Signed)
Pap smear with HPV performed.  No previous history of abnormal Paps.

## 2020-04-15 NOTE — Assessment & Plan Note (Addendum)
Asymptomatic.  GC/CH/trich, wet prep, HIV, hep C, and RPR collected.  Will treat accordingly as needed, encouraged adequate contraception with condom use to prevent STDs.

## 2020-04-15 NOTE — Assessment & Plan Note (Addendum)
Region on dorsal left hand likely ganglion cyst.  Recommended continued use of wrist brace, activity modification, Aspercreme/Voltaren gel, and ice frequently throughout the day.  Given significant patient frustration due to constant aching, after discussion with Dr. Garlan Fillers, placed referral to sports medicine for ultrasound of region +/- aspiration if appropriate.

## 2020-04-16 LAB — RPR: RPR Ser Ql: NONREACTIVE

## 2020-04-16 LAB — HIV ANTIBODY (ROUTINE TESTING W REFLEX): HIV Screen 4th Generation wRfx: NONREACTIVE

## 2020-04-16 LAB — HEPATITIS C ANTIBODY: Hep C Virus Ab: <0.1 s/co ratio (ref 0.0–0.9)

## 2020-04-19 ENCOUNTER — Telehealth: Payer: Self-pay

## 2020-04-19 LAB — CYTOLOGY - PAP
Adequacy: ABNORMAL
Chlamydia: NEGATIVE
Comment: NEGATIVE
Comment: NEGATIVE
Comment: NEGATIVE
Comment: NORMAL
Neisseria Gonorrhea: NEGATIVE
Trichomonas: NEGATIVE

## 2020-04-19 NOTE — Telephone Encounter (Signed)
Patient returns call to nurse line. Informed that patient would need repeat pap smear. Patient is having issues accessing results via mychart. Will attempt to send again. Offered to print off and leave copy up front. Patient requests that results are resent via mychart. Upon chart review, pap results are visible to patient.   Scheduled patient on 3/22 with Dr. Ouida Sills for repeat.  Talbot Grumbling, RN

## 2020-04-19 NOTE — Telephone Encounter (Signed)
Patient LVM on nurse line requesting pap smear results. The pap came back unsatisfactory. I am assuming she needs to come back for retest? Please advise.

## 2020-04-22 ENCOUNTER — Ambulatory Visit: Payer: 59 | Admitting: Student in an Organized Health Care Education/Training Program

## 2020-04-24 ENCOUNTER — Ambulatory Visit: Payer: 59 | Admitting: Family Medicine

## 2020-04-30 ENCOUNTER — Other Ambulatory Visit: Payer: Self-pay

## 2020-04-30 ENCOUNTER — Ambulatory Visit (INDEPENDENT_AMBULATORY_CARE_PROVIDER_SITE_OTHER): Payer: 59 | Admitting: Student in an Organized Health Care Education/Training Program

## 2020-04-30 ENCOUNTER — Other Ambulatory Visit (HOSPITAL_COMMUNITY)
Admission: RE | Admit: 2020-04-30 | Discharge: 2020-04-30 | Disposition: A | Discharge status: Home / Self Care | Payer: 59 | Source: Ambulatory Visit | Attending: Family Medicine | Admitting: Family Medicine

## 2020-04-30 DIAGNOSIS — Z113 Encounter for screening for infections with a predominantly sexual mode of transmission: Secondary | ICD-10-CM | POA: Insufficient documentation

## 2020-04-30 DIAGNOSIS — Z124 Encounter for screening for malignant neoplasm of cervix: Secondary | ICD-10-CM | POA: Diagnosis present

## 2020-04-30 LAB — POCT WET PREP (WET MOUNT)
Clue Cells Wet Prep Whiff POC: NEGATIVE
Trichomonas Wet Prep HPF POC: ABSENT

## 2020-04-30 NOTE — Patient Instructions (Signed)
It was a pleasure to see you today!  To summarize our discussion for this visit:  I apologize that your results were not able to be run on the first sample. I made sure to get a very good sample for pap smear today as well as checking for STIs again. I will mychart you with the results and send in any treatments that may be needed.    Call the clinic at [phone] if your symptoms worsen or you have any concerns.   Thank you for allowing me to take part in your care,  Dr. Doristine Mango   Bartholin's Cyst  A Bartholin's cyst is a fluid-filled sac that forms on a Bartholin's gland. Bartholin's glands are small glands in the folds of skin near the opening of the vagina (labia). This type of cyst causes a bulge or lump near the opening of the vagina. If you have a cyst that is small and not infected, you may be able to take care of it at home. If your cyst gets infected, it may cause pain and your doctor may need to drain it. What are the causes? This condition may be caused by a blocked Bartholin's gland. Germs (bacteria) inside of the cyst can cause an infection. What are the signs or symptoms?  A bulge or lump near the opening of the vagina.  Discomfort or pain.  Redness, swelling, or fluid draining from the area. How is this treated? You may not need treatment if your cyst is not causing symptoms. The cyst can go away on its own with home care. Home care includes hot baths or heat therapy. Large cysts or cysts that are infected may be treated with:  Antibiotic medicine.  A procedure to drain the fluid. Cysts that keep coming back will need to be drained many times. Your doctor may talk to you about surgery to remove the cyst. Follow these instructions at home: Medicines  Take over-the-counter and prescription medicines only as told by your doctor.  If you were prescribed an antibiotic medicine, take it as told by your doctor. Do not stop taking it even if you start to  feel better. Managing pain and swelling  Try sitz baths to help with pain and swelling. A sitz bath is a warm water bath in which the water only comes up to your hips and should cover your buttocks. You may take sitz baths a few times a day.  If told, put heat on the affected area as often as needed. Use the heat source that your doctor recommends, such as a moist heat pack or a heating pad. ? Place a towel between your skin and the heat source. ? Leave the heat on for 20-30 minutes. ? Take off the heat if your skin turns bright red. This is very important. If you cannot feel pain, heat, or cold, you have a greater risk of getting burned. General instructions  If your cyst was drained: ? Follow instructions from your doctor about how to take care of your wound. ? Use feminine pads to absorb any fluid.  Do not push on or squeeze your cyst.  Do not have sex until the cyst has gone away or your wound from drainage has healed.  Take these steps to help prevent a cyst from returning, and to prevent other cysts from forming: ? Take a bath or shower once a day. Clean the area around your vagina with mild soap and water when you bathe. ? Practice safe  sex to prevent STIs. Talk with your doctor about how to prevent STIs and which forms of birth control to use.  Keep all follow-up visits. Contact a doctor if:  You have a fever.  You get more redness, swelling, or pain around your cyst.  You have fluid, blood, pus, or a bad smell coming from your cyst.  You have a cyst that gets larger or a cyst that comes back. Summary  A Bartholin's cyst is a fluid-filled sac that forms on a Bartholin's gland. These small glands are found in the folds of skin near the opening of the vagina (labia).  This type of cyst causes a bulge or lump near the opening of the vagina.  Try sitz baths a few times a day to help with pain and swelling.  Do not push on or squeeze your cyst. This information is not  intended to replace advice given to you by your health care provider. Make sure you discuss any questions you have with your health care provider. Document Revised: 06/26/2019 Document Reviewed: 06/26/2019 Elsevier Patient Education  [place].

## 2020-04-30 NOTE — Progress Notes (Signed)
   SUBJECTIVE:   CHIEF COMPLAINT / HPI: repeat pap  Patient presents today to repeat cervical cancer screening. Previous had inadequate sample.  Patient endorses using metrogel the day prior to her previous pap smear and would like to repeat the STI screening today as well.  She denies pruritus or dysuria. Endorses mild white discharge today.  OBJECTIVE:   [name] 04/07/2020 (Exact Date)   Physical Exam Vitals and nursing note reviewed. Exam conducted with a chaperone present.  Constitutional:      Appearance: Normal appearance. She is normal weight. She is not ill-appearing.  Genitourinary:    General: Normal vulva.     Exam position: Lithotomy position.     Pubic Area: No rash.      Vagina: Vaginal discharge (thick, white) present. No bleeding.     Cervix: Friability and cervical bleeding present.     Comments: Positive small bartholin cyst in 8 oclock position. Non-tender to palpation Cervix was retroverted and angled towards patient's left. Scant bleeding with os brushing  Neurological:     Mental Status: She is alert.    ASSESSMENT/PLAN:   Encounter for Papanicolaou smear for cervical cancer screening Repeat cervical cancer screening from recent inadequate sample Added GC/C to sample.  Additional wet prep collected mychart with results and treatment as needed     Richarda Osmond, [place]

## 2020-04-30 NOTE — Assessment & Plan Note (Addendum)
Repeat cervical cancer screening from recent inadequate sample Added GC/C to sample.  Additional wet prep collected mychart with results and treatment as needed

## 2020-05-01 LAB — CYTOLOGY - PAP
Chlamydia: NEGATIVE
Comment: NEGATIVE
Comment: NORMAL
Diagnosis: NEGATIVE
Neisseria Gonorrhea: NEGATIVE

## 2020-05-02 ENCOUNTER — Telehealth: Payer: Self-pay | Admitting: *Deleted

## 2020-05-02 NOTE — Telephone Encounter (Signed)
Pt would like results of PAP. Christen Bame, CMA

## 2020-05-17 ENCOUNTER — Ambulatory Visit (HOSPITAL_COMMUNITY)
Admission: EM | Admit: 2020-05-17 | Discharge: 2020-05-17 | Disposition: A | Discharge status: Home / Self Care | Payer: 59 | Attending: Internal Medicine | Admitting: Internal Medicine

## 2020-05-17 ENCOUNTER — Other Ambulatory Visit: Payer: Self-pay

## 2020-05-17 ENCOUNTER — Encounter (HOSPITAL_COMMUNITY): Payer: Self-pay

## 2020-05-17 DIAGNOSIS — N898 Other specified noninflammatory disorders of vagina: Secondary | ICD-10-CM | POA: Diagnosis present

## 2020-05-17 LAB — POCT URINALYSIS DIPSTICK, ED / UC
Bilirubin Urine: NEGATIVE
Glucose, UA: NEGATIVE mg/dL
Hgb urine dipstick: NEGATIVE
Ketones, ur: NEGATIVE mg/dL
Leukocytes,Ua: NEGATIVE
Nitrite: NEGATIVE
Protein, ur: NEGATIVE mg/dL
Specific Gravity, Urine: 1.015 (ref 1.005–1.030)
Urobilinogen, UA: 0.2 mg/dL (ref 0.0–1.0)
pH: 6 (ref 5.0–8.0)

## 2020-05-17 LAB — POC URINE PREG, ED: Preg Test, Ur: NEGATIVE

## 2020-05-17 NOTE — ED Triage Notes (Signed)
Pt here with c/o mild abdominal pain, whitish vaginal discharge and urinary frequency. Pt requests STD screening.

## 2020-05-17 NOTE — ED Provider Notes (Signed)
[facility]    CSN: [id number] Arrival date & time: 05/17/20  0801      History   Chief Complaint Chief Complaint  Patient presents with  . Urinary Frequency  . Vaginal Discharge  . Abdominal Pain    HPI [name] is a 40 y.o. female.   HPI   Vaginal Discharge: Pt states that for the past 4 days she has had white vaginal discharge, urinary frequency and dysuria. When asked she denies pelvic and abdominal pain. She has not tried anything for symptoms. She would like STI screening but denies any known STIs. [place]: 05/02/2020.    Past Medical History:  Diagnosis Date  . Endometriosis   . History of 2019 novel coronavirus disease (COVID-19)    pt tested positive 03-03-2019, results in care everywhere.  (05-22-2019 per pt had symptoms of fatigue, headache, and loss of taste/ smell;  fatigue and headache after one week and loss taste/ smell resolved after 3 wks)  . Infertility, female   . Ovarian cyst   . Pelvic mass in female    large fluid filled pelvic mass  . Recurrent boils   . Septate uterus   . Trichomoniasis     Patient Active Problem List   Diagnosis Date Noted  . Encounter for Papanicolaou smear for cervical cancer screening 04/15/2020  . Hand problems 04/15/2020  . Routine screening for STI (sexually transmitted infection) 01/31/2020  . Vaginitis 12/25/2019  . Menorrhagia 12/25/2019  . Preventative health care 12/25/2019  . GERD (gastroesophageal reflux disease) 06/07/2019  . HSV-2 seropositive 03/07/2019  . Hydrosalpinx 12/30/2018  . Screening examination for STD (sexually transmitted disease) 07/09/2017  . History of abnormal mammogram 01/22/2013  . BV (bacterial vaginosis) 01/18/2012  . Endometriosis 05/01/2011  . SEPTATE UTERUS 07/24/2009  . TOBACCO ABUSE 09/26/2008  . Female infertility 09/26/2008  . IRREGULAR MENSTRUATION 09/21/2006    Past Surgical History:  Procedure Laterality Date  . BREAST BIOPSY Right 1999   per pt benign    . LAPAROSCOPIC SALPINGOOPHERECTOMY Right 03-25-2010   @[facility]    and LYSIS ADHESIONS  . LAPAROSCOPY N/A 05/30/2019   Procedure: DIAGNOSTIC LAPAROSCOPY;  Surgeon: Woodroe Mode, MD;  Location: [facility];  Service: Gynecology;  Laterality: N/A;    OB History    Gravida  0   Para      Term      Preterm      AB      Living  0     SAB      IAB      Ectopic      Multiple      Live Births               Home Medications    Prior to Admission medications   Medication Sig Start Date End Date Taking? Authorizing Provider  acetic acid 2 % otic solution Place 4 drops into the left ear 3 (three) times daily. Patient not taking: Reported on 03/21/2020 06/15/19   Scot Jun, FNP  Ca Carbonate-Mag Hydroxide (ROLAIDS PO) Take by mouth as needed. Patient not taking: Reported on 03/21/2020    [provider]  cetirizine (ZYRTEC ALLERGY) 10 MG tablet Take 1 tablet (10 mg total) by mouth daily. Patient not taking: Reported on 03/21/2020 10/02/19   Jaynee Eagles, PA-C  ELDERBERRY PO Take by mouth. Patient not taking: Reported on 03/21/2020    [provider]  fluconazole (DIFLUCAN) 150 MG tablet Take 1 tablet (  150 mg total) by mouth daily. Patient not taking: Reported on 03/21/2020 02/10/20   Hazel Sams, PA-C  metroNIDAZOLE (METROGEL) 1 % gel Apply topically daily. Patient not taking: Reported on 03/21/2020 02/10/20   Hazel Sams, PA-C  Multiple Vitamins-Minerals (HAIR SKIN NAILS PO) Take by mouth. Patient not taking: Reported on 03/21/2020    [provider]  Norethindrone Acetate-Ethinyl Estrad-FE (LOESTRIN 24 FE) 1-20 MG-MCG(24) tablet Take 1 tablet by mouth daily. 03/21/20   Lavonia Drafts, MD  ondansetron (ZOFRAN-ODT) 8 MG disintegrating tablet Take 1 tablet (8 mg total) by mouth every 8 (eight) hours as needed for nausea or vomiting. Patient not taking: Reported on 03/21/2020 10/16/19   Jaynee Eagles, PA-C  pantoprazole (PROTONIX)  20 MG tablet Take 20 mg by mouth daily.  Patient not taking: Reported on 03/21/2020    [provider]  Probiotic Product (CULTURELLE PROBIOTICS PO) Take by mouth. Patient not taking: Reported on 03/21/2020    [provider]  Norgestimate-Ethinyl Estradiol Triphasic (TRI-SPRINTEC) 0.18/0.215/0.25 MG-35 MCG tablet Take 1 tablet by mouth daily. 11/03/10 05/01/11  Woodroe Mode, MD    Family History Family History  Problem Relation Age of Onset  . Hypertension Mother   . Diabetes Mother   . Cancer Father   . Hypertension Father   . Diabetes Brother   . Hypertension Brother   . Breast cancer Neg Hx     Social History Social History   Tobacco Use  . Smoking status: Current Every Day Smoker    Packs/day: 0.25    Years: 15.00    Pack years: 3.75    Types: Cigarettes  . Smokeless tobacco: Never Used  Vaping Use  . Vaping Use: Never used  Substance Use Topics  . Alcohol use: Yes    Comment: occasionally  . Drug use: Never     Allergies   Sulfa antibiotics   Review of Systems Review of Systems  As stated above in HPI Physical Exam Triage Vital Signs ED Triage Vitals  Enc Vitals Group     BP 05/17/20 0817 101/70     Pulse Rate 05/17/20 0817 (!) 106     Resp 05/17/20 0817 18     Temp 05/17/20 0817 98.5 F (36.9 C)     Temp src --      SpO2 05/17/20 0817 100 %     Weight --      Height --      Head Circumference --      Peak Flow --      Pain Score 05/17/20 0813 3     Pain Loc --      Pain Edu? --      Excl. in [place]? --    No data found.  Updated Vital Signs BP 101/70   Pulse (!) 106   Temp 98.5 F (36.9 C)   Resp 18   [name] 05/05/2020   SpO2 100%   Physical Exam Vitals and nursing note reviewed.  Constitutional:      General: She is not in acute distress.    Appearance: She is well-developed. She is not ill-appearing, toxic-appearing or diaphoretic.  Cardiovascular:     Rate and Rhythm: Normal rate and regular rhythm.     Heart  sounds: Normal heart sounds.  Pulmonary:     Effort: Pulmonary effort is normal.     Breath sounds: Normal breath sounds.  Abdominal:     General: Abdomen is flat. Bowel sounds are normal.  Palpations: Abdomen is soft. There is no shifting dullness, fluid wave, hepatomegaly, splenomegaly, mass or pulsatile mass.     Tenderness: There is no abdominal tenderness.     Hernia: No hernia is present.  Genitourinary:    Comments: Pt performs self swab collection Neurological:     Mental Status: She is alert and oriented to person, place, and time.      UC Treatments / Results  Labs (all labs ordered are listed, but only abnormal results are displayed) Labs Reviewed  POCT URINALYSIS DIPSTICK, ED / UC    EKG   Radiology No results found.  Procedures Procedures (including critical care time)  Medications Ordered in UC Medications - No data to display  Initial Impression / Assessment and Plan / UC Course  I have reviewed the triage vital signs and the nursing notes.  Pertinent labs & imaging results that were available during my care of the patient were reviewed by me and considered in my medical decision making (see chart for details).     New. She elects vaginal STI screening only. She states that she "needs to get to work so can we speed things along" and would like to leave prior to receiving AVS as she has Mychart. Will treat depending on cytology results.    Final Clinical Impressions(s) / UC Diagnoses   Final diagnoses:  None   Discharge Instructions   None    ED Prescriptions    None     PDMP not reviewed this encounter.   Hughie Closs, [place] 05/17/20 [phone]

## 2020-05-20 ENCOUNTER — Telehealth (HOSPITAL_COMMUNITY): Payer: Self-pay | Admitting: Emergency Medicine

## 2020-05-20 LAB — CERVICOVAGINAL ANCILLARY ONLY
Bacterial Vaginitis (gardnerella): POSITIVE — AB
Candida Glabrata: NEGATIVE
Candida Vaginitis: POSITIVE — AB
Chlamydia: NEGATIVE
Comment: NEGATIVE
Comment: NEGATIVE
Comment: NEGATIVE
Comment: NEGATIVE
Comment: NEGATIVE
Comment: NORMAL
Neisseria Gonorrhea: NEGATIVE
Trichomonas: NEGATIVE

## 2020-05-20 MED ORDER — METRONIDAZOLE 500 MG PO TABS: 500.0000 mg | ORAL_TABLET | Freq: Two times a day (BID) | ORAL | 0 refills | Status: DC

## 2020-05-20 MED ORDER — FLUCONAZOLE 150 MG PO TABS: 150.0000 mg | ORAL_TABLET | Freq: Once | ORAL | 0 refills | Status: AC

## 2020-05-26 ENCOUNTER — Encounter: Payer: Self-pay | Admitting: Student in an Organized Health Care Education/Training Program

## 2020-05-28 ENCOUNTER — Ambulatory Visit (HOSPITAL_COMMUNITY)
Admission: EM | Admit: 2020-05-28 | Discharge: 2020-05-28 | Disposition: A | Discharge status: Home / Self Care | Payer: 59 | Attending: Student | Admitting: Student

## 2020-05-28 ENCOUNTER — Other Ambulatory Visit: Payer: Self-pay

## 2020-05-28 ENCOUNTER — Encounter (HOSPITAL_COMMUNITY): Payer: Self-pay

## 2020-05-28 DIAGNOSIS — Z1152 Encounter for screening for COVID-19: Secondary | ICD-10-CM | POA: Insufficient documentation

## 2020-05-28 DIAGNOSIS — J069 Acute upper respiratory infection, unspecified: Secondary | ICD-10-CM | POA: Insufficient documentation

## 2020-05-28 LAB — SARS CORONAVIRUS 2 (TAT 6-24 HRS): SARS Coronavirus 2: NEGATIVE

## 2020-05-28 LAB — POC INFLUENZA A AND B ANTIGEN (URGENT CARE ONLY)
Influenza A Ag: NEGATIVE
Influenza B Ag: NEGATIVE

## 2020-05-28 MED ORDER — ALBUTEROL SULFATE HFA 108 (90 BASE) MCG/ACT IN AERS: 1.0000 | INHALATION_SPRAY | Freq: Four times a day (QID) | RESPIRATORY_TRACT | 0 refills | Status: DC | PRN

## 2020-05-28 MED ORDER — PROMETHAZINE-DM 6.25-15 MG/5ML PO SYRP: 5.0000 mL | ORAL_SOLUTION | Freq: Four times a day (QID) | ORAL | 0 refills | Status: DC | PRN

## 2020-05-28 MED ORDER — BENZONATATE 100 MG PO CAPS: 100.0000 mg | ORAL_CAPSULE | Freq: Three times a day (TID) | ORAL | 0 refills | Status: DC

## 2020-05-28 NOTE — ED Triage Notes (Signed)
Pt in with c/o chills, chest congestion and headaches that started on sunday  Pt took theraflu and alka seltzer for relief

## 2020-05-28 NOTE — ED Provider Notes (Addendum)
[facility]    CSN: [id number] Arrival date & time: 05/28/20  0801      History   Chief Complaint Chief Complaint  Patient presents with  . chest congestion  . Chills  . Headache  . Cough    HPI [name] is a 40 y.o. female presenting with sore throat, nasal congestion, subjective chills.  History of ovarian cyst, pelvic mass, recurrent boils, septate uterus, trichomoniasis, HSV, BV, endometriosis.  Notes 2 days of symptoms.  Has tried Alka-Seltzer and TheraFlu with minimal relief.  Has not checked her temperature at home.  Has not taken an antipyretic.  Denies nausea, vomiting, diarrhea.  HPI  Past Medical History:  Diagnosis Date  . Endometriosis   . History of 2019 novel coronavirus disease (COVID-19)    pt tested positive 03-03-2019, results in care everywhere.  (05-22-2019 per pt had symptoms of fatigue, headache, and loss of taste/ smell;  fatigue and headache after one week and loss taste/ smell resolved after 3 wks)  . Infertility, female   . Ovarian cyst   . Pelvic mass in female    large fluid filled pelvic mass  . Recurrent boils   . Septate uterus   . Trichomoniasis     Patient Active Problem List   Diagnosis Date Noted  . Encounter for Papanicolaou smear for cervical cancer screening 04/15/2020  . Hand problems 04/15/2020  . Routine screening for STI (sexually transmitted infection) 01/31/2020  . Vaginitis 12/25/2019  . Menorrhagia 12/25/2019  . Preventative health care 12/25/2019  . GERD (gastroesophageal reflux disease) 06/07/2019  . HSV-2 seropositive 03/07/2019  . Hydrosalpinx 12/30/2018  . Screening examination for STD (sexually transmitted disease) 07/09/2017  . History of abnormal mammogram 01/22/2013  . BV (bacterial vaginosis) 01/18/2012  . Endometriosis 05/01/2011  . SEPTATE UTERUS 07/24/2009  . TOBACCO ABUSE 09/26/2008  . Female infertility 09/26/2008  . IRREGULAR MENSTRUATION 09/21/2006    Past Surgical History:   Procedure Laterality Date  . BREAST BIOPSY Right 1999   per pt benign   . LAPAROSCOPIC SALPINGOOPHERECTOMY Right 03-25-2010   @[facility]    and LYSIS ADHESIONS  . LAPAROSCOPY N/A 05/30/2019   Procedure: DIAGNOSTIC LAPAROSCOPY;  Surgeon: Woodroe Mode, MD;  Location: [facility];  Service: Gynecology;  Laterality: N/A;    OB History    Gravida  0   Para      Term      Preterm      AB      Living  0     SAB      IAB      Ectopic      Multiple      Live Births               Home Medications    Prior to Admission medications   Medication Sig Start Date End Date Taking? Authorizing Provider  albuterol (VENTOLIN HFA) 108 (90 Base) MCG/ACT inhaler Inhale 1-2 puffs into the lungs every 6 (six) hours as needed for wheezing or shortness of breath. 05/28/20  Yes Hazel Sams, PA-C  benzonatate (TESSALON) 100 MG capsule Take 1 capsule (100 mg total) by mouth every 8 (eight) hours. 05/28/20  Yes Hazel Sams, PA-C  promethazine-dextromethorphan (PROMETHAZINE-DM) 6.25-15 MG/5ML syrup Take 5 mLs by mouth 4 (four) times daily as needed for cough. 05/28/20  Yes Hazel Sams, PA-C  metroNIDAZOLE (FLAGYL) 500 MG tablet Take 1 tablet (500 mg total) by mouth 2 (two) times daily.  05/20/20   Chase Picket, MD  cetirizine (ZYRTEC ALLERGY) 10 MG tablet Take 1 tablet (10 mg total) by mouth daily. Patient not taking: Reported on 03/21/2020 10/02/19 05/28/20  Jaynee Eagles, PA-C  Norethindrone Acetate-Ethinyl Estrad-FE (LOESTRIN 24 FE) 1-20 MG-MCG(24) tablet Take 1 tablet by mouth daily. 03/21/20 05/28/20  Lavonia Drafts, MD  Norgestimate-Ethinyl Estradiol Triphasic (TRI-SPRINTEC) 0.18/0.215/0.25 MG-35 MCG tablet Take 1 tablet by mouth daily. 11/03/10 05/01/11  Woodroe Mode, MD    Family History Family History  Problem Relation Age of Onset  . Hypertension Mother   . Diabetes Mother   . Cancer Father   . Hypertension Father   . Diabetes Brother   . Hypertension  Brother   . Breast cancer Neg Hx     Social History Social History   Tobacco Use  . Smoking status: Current Every Day Smoker    Packs/day: 0.25    Years: 15.00    Pack years: 3.75    Types: Cigarettes  . Smokeless tobacco: Never Used  Vaping Use  . Vaping Use: Never used  Substance Use Topics  . Alcohol use: Yes    Comment: occasionally  . Drug use: Never     Allergies   Sulfa antibiotics   Review of Systems Review of Systems  Constitutional: Positive for chills. Negative for appetite change and fever.  HENT: Positive for congestion. Negative for ear pain, rhinorrhea, sinus pressure, sinus pain and sore throat.   Eyes: Negative for redness and visual disturbance.  Respiratory: Positive for cough. Negative for chest tightness, shortness of breath and wheezing.   Cardiovascular: Negative for chest pain and palpitations.  Gastrointestinal: Negative for abdominal pain, constipation, diarrhea, nausea and vomiting.  Genitourinary: Negative for dysuria, frequency and urgency.  Musculoskeletal: Negative for myalgias.  Neurological: Negative for dizziness, weakness and headaches.  Psychiatric/Behavioral: Negative for confusion.  All other systems reviewed and are negative.    Physical Exam Triage Vital Signs ED Triage Vitals  Enc Vitals Group     BP      Pulse      Resp      Temp      Temp src      SpO2      Weight      Height      Head Circumference      Peak Flow      Pain Score      Pain Loc      Pain Edu?      Excl. in [place]?    No data found.  Updated Vital Signs BP 108/88   Pulse (!) 101   Temp 99.2 F (37.3 C)   Resp 18   [name] 05/05/2020   SpO2 100%   Visual Acuity Right Eye Distance:   Left Eye Distance:   Bilateral Distance:    Right Eye Near:   Left Eye Near:    Bilateral Near:     Physical Exam Vitals reviewed.  Constitutional:      General: She is not in acute distress.    Appearance: Normal appearance. She is not ill-appearing.   HENT:     Head: Normocephalic and atraumatic.     Right Ear: Hearing, tympanic membrane, ear canal and external ear normal. No swelling or tenderness. There is no impacted cerumen. No mastoid tenderness. Tympanic membrane is not perforated, erythematous, retracted or bulging.     Left Ear: Hearing, tympanic membrane, ear canal and external ear normal. No swelling or tenderness. There is  no impacted cerumen. No mastoid tenderness. Tympanic membrane is not perforated, erythematous, retracted or bulging.     Nose:     Right Sinus: No maxillary sinus tenderness or frontal sinus tenderness.     Left Sinus: No maxillary sinus tenderness or frontal sinus tenderness.     Mouth/Throat:     Mouth: Mucous membranes are moist.     Pharynx: Uvula midline. No oropharyngeal exudate or posterior oropharyngeal erythema.     Tonsils: No tonsillar exudate.  Cardiovascular:     Rate and Rhythm: Normal rate and regular rhythm.     Heart sounds: Normal heart sounds.  Pulmonary:     Effort: No tachypnea, bradypnea or accessory muscle usage.     Breath sounds: Normal breath sounds and air entry. No decreased breath sounds, wheezing, rhonchi or rales.  Chest:     Chest wall: No tenderness.  Abdominal:     General: Abdomen is flat. Bowel sounds are normal.     Tenderness: There is no abdominal tenderness. There is no guarding or rebound.  Lymphadenopathy:     Cervical: No cervical adenopathy.  Skin:    Capillary Refill: Capillary refill takes less than 2 seconds.  Neurological:     General: No focal deficit present.     Mental Status: She is alert and oriented to person, place, and time.  Psychiatric:        Attention and Perception: Attention and perception normal.        Mood and Affect: Mood and affect normal.        Behavior: Behavior normal. Behavior is cooperative.        Thought Content: Thought content normal.        Judgment: Judgment normal.      UC Treatments / Results  Labs (all labs  ordered are listed, but only abnormal results are displayed) Labs Reviewed  SARS CORONAVIRUS 2 (TAT 6-24 HRS)  POC INFLUENZA A AND B ANTIGEN (URGENT CARE ONLY)    EKG   Radiology No results found.  Procedures Procedures (including critical care time)  Medications Ordered in UC Medications - No data to display  Initial Impression / Assessment and Plan / UC Course  I have reviewed the triage vital signs and the nursing notes.  Pertinent labs & imaging results that were available during my care of the patient were reviewed by me and considered in my medical decision making (see chart for details).      This patient is a 40 year old female presenting with flu-like illness. Today this pt is afebrile nontachycardic nontachypneic, oxygenating well on room air, no wheezes rhonchi or rales. Has not taken antipyretic.   Promethazine, Tessalon, tylenol/ibuprofen. Pt also requesting albuterol inhaler for cough; sent. She does not have cardiopulmonary disease. Work note provided.  Rapid influenza negative. Covid PCR sent.  ED return precautions discussed.  Final Clinical Impressions(s) / UC Diagnoses   Final diagnoses:  Viral URI with cough  Encounter for screening for COVID-19     Discharge Instructions     -Promethazine DM cough syrup for congestion/cough. This could make you drowsy, so take at night before bed. -Tessalon (Benzonatate) as needed for cough. Take one pill up to 3x daily (every 8 hours) -Albuterol inhaler as needed for cough and shortness of breath -For fevers/chills, headaches, bodyaches- Take Tylenol 1000 mg 3 times daily, and ibuprofen 800 mg 3 times daily with food.  You can take these together, or alternate every 3-4 hours. -You will be contagious for  a total of 5 to 7 days. Generally,  you are contagious while you're still having fevers. I provided a work note for 3 days, allowing you to return to work on day 6. -Seek additional medical attention if your  symptoms worsen or persist, like new shortness of breath, chest pain, dizziness.    ED Prescriptions    Medication Sig Dispense Auth. Provider   benzonatate (TESSALON) 100 MG capsule Take 1 capsule (100 mg total) by mouth every 8 (eight) hours. 21 capsule Hazel Sams, PA-C   promethazine-dextromethorphan (PROMETHAZINE-DM) 6.25-15 MG/5ML syrup Take 5 mLs by mouth 4 (four) times daily as needed for cough. 118 mL Hazel Sams, PA-C   albuterol (VENTOLIN HFA) 108 (90 Base) MCG/ACT inhaler Inhale 1-2 puffs into the lungs every 6 (six) hours as needed for wheezing or shortness of breath. 1 each Hazel Sams, PA-C     PDMP not reviewed this encounter.   Hazel Sams, PA-C 05/28/20 0843    Hazel Sams, PA-C 05/28/20 [phone]

## 2020-05-28 NOTE — Discharge Instructions (Addendum)
-  Promethazine DM cough syrup for congestion/cough. This could make you drowsy, so take at night before bed. -Tessalon (Benzonatate) as needed for cough. Take one pill up to 3x daily (every 8 hours) -Albuterol inhaler as needed for cough and shortness of breath -For fevers/chills, headaches, bodyaches- Take Tylenol 1000 mg 3 times daily, and ibuprofen 800 mg 3 times daily with food.  You can take these together, or alternate every 3-4 hours. -You will be contagious for a total of 5 to 7 days. Generally,  you are contagious while you're still having fevers. I provided a work note for 3 days, allowing you to return to work on day 6. -Seek additional medical attention if your symptoms worsen or persist, like new shortness of breath, chest pain, dizziness.

## 2020-05-30 ENCOUNTER — Ambulatory Visit
Admission: RE | Admit: 2020-05-30 | Discharge: 2020-05-30 | Disposition: A | Discharge status: Home / Self Care | Payer: 59 | Source: Ambulatory Visit | Attending: Family Medicine | Admitting: Family Medicine

## 2020-05-30 ENCOUNTER — Other Ambulatory Visit: Payer: Self-pay | Admitting: Student in an Organized Health Care Education/Training Program

## 2020-05-30 ENCOUNTER — Telehealth (HOSPITAL_COMMUNITY): Payer: Self-pay | Admitting: Emergency Medicine

## 2020-05-30 ENCOUNTER — Other Ambulatory Visit: Payer: Self-pay

## 2020-05-30 DIAGNOSIS — Z1231 Encounter for screening mammogram for malignant neoplasm of breast: Secondary | ICD-10-CM

## 2020-05-30 MED ORDER — METRONIDAZOLE 0.75 % VA GEL: 1.0000 | Freq: Every day | VAGINAL | 0 refills | Status: AC

## 2020-05-30 NOTE — Telephone Encounter (Signed)
Patient states she is wanting gel form of metronidazole for treatment.

## 2020-05-31 NOTE — Telephone Encounter (Signed)
Patient is scheduled for 06-04-20. Dajahnae Vondra,CMA

## 2020-06-03 ENCOUNTER — Encounter: Payer: Self-pay | Admitting: Student in an Organized Health Care Education/Training Program

## 2020-06-04 ENCOUNTER — Ambulatory Visit: Payer: 59 | Admitting: Student in an Organized Health Care Education/Training Program

## 2020-06-07 ENCOUNTER — Encounter: Payer: Self-pay | Admitting: Student in an Organized Health Care Education/Training Program

## 2020-06-07 ENCOUNTER — Other Ambulatory Visit: Payer: Self-pay | Admitting: Student in an Organized Health Care Education/Training Program

## 2020-06-07 MED ORDER — METRONIDAZOLE 500 MG PO TABS: 500.0000 mg | ORAL_TABLET | Freq: Two times a day (BID) | ORAL | 0 refills | Status: AC

## 2020-06-07 NOTE — Telephone Encounter (Signed)
Patient calls nurse line to follow up on mychart message for medication. Patient reports that something was supposed to be called in for lesions/bumps after the 3/22 visit. I am not able to find record of this.   Patient reports that she does not need treatment for BV at this time.   Please advise.   Talbot Grumbling, RN

## 2020-06-13 ENCOUNTER — Encounter: Payer: Self-pay | Admitting: Student in an Organized Health Care Education/Training Program

## 2020-06-13 ENCOUNTER — Ambulatory Visit (INDEPENDENT_AMBULATORY_CARE_PROVIDER_SITE_OTHER): Payer: 59 | Admitting: Student in an Organized Health Care Education/Training Program

## 2020-06-13 ENCOUNTER — Other Ambulatory Visit: Payer: Self-pay

## 2020-06-13 VITALS — BP 98/62 | HR 83 | Ht 62.0 in | Wt 168.0 lb

## 2020-06-13 DIAGNOSIS — M67472 Ganglion, left ankle and foot: Secondary | ICD-10-CM | POA: Diagnosis not present

## 2020-06-13 DIAGNOSIS — L918 Other hypertrophic disorders of the skin: Secondary | ICD-10-CM | POA: Insufficient documentation

## 2020-06-13 NOTE — Assessment & Plan Note (Addendum)
Two- left side of neck is since childhood, right side of neck present only past few years. patient requested removal. Procedure tolerated well

## 2020-06-13 NOTE — Patient Instructions (Addendum)
It was a pleasure to see you today!  To summarize our discussion for this visit:  It appears that you have a ganglion cyst on your foot. I have sent in a referral to podiatry for further evaluation.   Please come back to see me for treatment of your skin tags.   Call the clinic at [phone] if your symptoms worsen or you have any concerns.   Thank you for allowing me to take part in your care,  Dr. Doristine Mango   Ganglion Cyst  A ganglion cyst is a non-cancerous, fluid-filled lump of tissue that occurs near a joint, tendon, or ligament. The cyst grows out of a joint or the lining of a tendon or ligament. Ganglion cysts most often develop in the hand or wrist, but they can also develop in the shoulder, elbow, hip, knee, ankle, or foot. Ganglion cysts are ball-shaped or egg-shaped. Their size can range from the size of a pea to larger than a grape. Increased activity may cause the cyst to get bigger because more fluid starts to build up. What are the causes? The exact cause of this condition is not known, but it may be related to:  Inflammation or irritation around the joint.  An injury or tear in the layers of tissue around the joint (joint capsule).  Repetitive movements or overuse.  History of acute or repeated injury. What increases the risk? You are more likely to develop this condition if:  You are a female.  You are 58-48 years old. What are the signs or symptoms? The main symptom of this condition is a lump. It most often appears on the hand or wrist. In many cases, there are no other symptoms, but a cyst can sometimes cause:  Tingling.  Pain or tenderness.  Numbness.  Weakness or loss of strength in the affected joint.  Decreased range of motion in the affected area of the body.   How is this diagnosed? Ganglion cysts are usually diagnosed based on a physical exam. Your health care provider will feel the lump and may shine a light next to it. If it is a  ganglion cyst, the light will likely shine through it. Your health care provider may order an X-ray, ultrasound, MRI, or CT scan to rule out other conditions. How is this treated? Ganglion cysts often go away on their own without treatment. If you have pain or other symptoms, treatment may be needed. Treatment is also needed if the ganglion cyst limits your movement or if it gets infected. Treatment may include:  Wearing a brace or splint on your wrist or finger.  Taking anti-inflammatory medicine.  Having fluid drained from the lump with a needle (aspiration).  Getting an injection of medicine into the joint to decrease inflammation. This may be corticosteroids, ethanol, or hyaluronidase.  Having surgery to remove the ganglion cyst.  Placing a pad in your shoe or wearing shoes that will not rub against the cyst if it is on your foot. Follow these instructions at home:  Do not press on the ganglion cyst, poke it with a needle, or hit it.  Take over-the-counter and prescription medicines only as told by your health care provider.  If you have a brace or splint: ? Wear it as told by your health care provider. ? Remove it as told by your health care provider. Ask if you need to remove it when you take a shower or a bath.  Watch your ganglion cyst for any changes.  Keep all follow-up visits as told by your health care provider. This is important. Contact a health care provider if:  Your ganglion cyst becomes larger or more painful.  You have pus coming from the lump.  You have weakness or numbness in the affected area.  You have a fever or chills. Get help right away if:  You have a fever and have any of these in the cyst area: ? Increased redness. ? Red streaks. ? Swelling. Summary  A ganglion cyst is a non-cancerous, fluid-filled lump that occurs near a joint, tendon, or ligament.  Ganglion cysts most often develop in the hand or wrist, but they can also develop in the  shoulder, elbow, hip, knee, ankle, or foot.  Ganglion cysts often go away on their own without treatment. This information is not intended to replace advice given to you by your health care provider. Make sure you discuss any questions you have with your health care provider. Document Revised: 04/19/2019 Document Reviewed: 04/19/2019 Elsevier Patient Education  [place].

## 2020-06-13 NOTE — Assessment & Plan Note (Signed)
Ganglion cyst on left foot has been increasing in size and pain for 2 months.  Discussed treatment options with patient including watchful waiting, NSAIDs and aspiration. Patient desires aspiration.  US findings consistent with simple cyst <1cm in diameter without vascularity on color doppler. precepted with Dr. Owens Shark who recommended podiatry referral for treatment. Patient was disappointed. Recommended NSAIDs such as voltaren gel to use topically and follow up with podiatry

## 2020-06-13 NOTE — Progress Notes (Signed)
    SUBJECTIVE:   CHIEF COMPLAINT / HPI: knot on foot  Knot- present x2 months on top of left foot. Progressively getting bigger and more painful. No erythema or drainage. Denies injury. Has not had this happen before.   Skin tags- present for several years without catching on clothing and she does not wear jewelry that irritates the skin there. One on bilateral neck/shoulder.  OBJECTIVE:   BP 98/62   Pulse 83   Ht 5\' 2"  (1.575 m)   Wt 168 lb (76.2 kg)   [name] 05/31/2020 (Exact Date)   SpO2 99%   BMI 30.73 kg/m   Physical Exam Vitals and nursing note reviewed.  Constitutional:      General: She is not in acute distress.    Appearance: She is normal weight. She is not ill-appearing or toxic-appearing.  Musculoskeletal:     Left foot: Normal range of motion.  Feet:     Left foot:     Skin integrity: Skin integrity normal.     Toenail Condition: Left toenails are normal.     Comments: Approximately 1cm firm and squishy nodule on dorsum of left foot. Mid metatarsal Neurological:     Mental Status: She is alert.   [place]: simple cystic structure without blood flow. Non-compressible.   ASSESSMENT/PLAN:   Ganglion cyst of left foot Ganglion cyst on left foot has been increasing in size and pain for 2 months.  Discussed treatment options with patient including watchful waiting, NSAIDs and aspiration. Patient desires aspiration.  US findings consistent with simple cyst <1cm in diameter without vascularity on color doppler. precepted with Dr. Owens Shark who recommended podiatry referral for treatment. Patient was disappointed. Recommended NSAIDs such as voltaren gel to use topically and follow up with podiatry  Achrochordon Two- left side of neck is since childhood, right side of neck present only past few years. patient requested removal. Procedure tolerated well   Removal of skin tags: single pedunculated skin tags on bilateral neck.shoulder junction were prepped with alcohol swab  x3. Cold spray applied. Skin tag stretched with forceps and cut at base of stalk. Patient tolerated well. Spontaneous hemostasis.  [place]

## 2020-06-17 ENCOUNTER — Ambulatory Visit: Payer: 59 | Admitting: Family Medicine

## 2020-06-22 NOTE — Progress Notes (Signed)
   Subjective:   Patient ID: [name]    DOB: 01/08/1981, 40 y.o. female   MRN: [id number]  [name] is a 40 y.o. female with a history of GERD, female infertility, hydrosalpinx, septate uterus, endometriosis, HSV, irregular menses, menorrhagia, tobacco abuse here for frequent urination  HPI: Patient presents with urinary frequency and discharge x 4 days. Notes white thicker discharge. 1-2 sexual partners in last year, denies any known STD exposures. Denies pelvic pain. Denies fevers, chills, flank pain, or dysuria. [name] 05/31/20.  Review of Systems:  Per HPI.   Objective:   BP 117/77   Pulse 63   Wt 164 lb 6.4 oz (74.6 kg)   [name] 05/31/2020 (Exact Date)   SpO2 100%   BMI 30.07 kg/m  Vitals and nursing note reviewed.  General: pleasant young female, sitting comfortably on exam bed, well nourished, well developed, in no acute distress with non-toxic appearance Resp: breathing comfortably on room air, speaking in full sentences Skin: warm, dry Extremities: warm and well perfused, normal tone MSK: gait normal Neuro: Alert and oriented, speech normal Pelvic exam: VULVA:  nodule on left vulva without no discharge, fluctuation, erythema or warmth, VAGINA: normal appearing vagina with normal color, white/grey thicker discharge, no lesions, CERVIX: normal appearing cervix without discharge or lesions, cervical motion tenderness absent, exam chaperoned by Delray Alt.  Assessment & Plan:   Vaginal discharge Acute x 4 days. Pelvic exam notable for thicker white/grey discharge. Wet prep negative. GC/Cl pending. No signs of PID on exam. Urine pregnancy test negative. - follow up remaining labs and treat if indicated   Frequent urination Acute x 4 days. No systemic symptoms. UA negative. - follow up urine culture  Hydradenitis Acute on chronic. Small nodule on left vulva without signs of acute infection. Patient requested "cream" she has been given in the past.  Per chart review,  appears patient has been given Clindamycin gel BID PRN. Also called in Emla cream for acute painful flares. Discussed proper. Recommended sits baths and warm compresses. Return precautions discussed.  STD screening: HIV, Hep C, RPR, GC/CL pending. Will follow up and treat if indicated.  Orders Placed This Encounter  Procedures  . Urine Culture  . HIV Antibody (routine testing w rflx)  . RPR  . Hepatitis C antibody  . POCT urine pregnancy  . POCT UA - Microscopic Only  . POCT urinalysis dipstick  . POCT Wet Prep [employer])  . POCT urinalysis dipstick   Meds ordered this encounter  Medications  . DISCONTD: lidocaine-prilocaine (EMLA) cream    Sig: Apply 1 application topically as needed (Painful boils).    Dispense:  30 g    Refill:  0  . clindamycin (CLINDAGEL) 1 % gel    Sig: Apply topically 2 (two) times daily.    Dispense:  60 g    Refill:  3  . lidocaine-prilocaine (EMLA) cream    Sig: Apply 1 application topically as needed (Painful boils).    Dispense:  30 g    Refill:  [place], DO PGY-3, [facility] 06/24/2020 5:59 PM

## 2020-06-24 ENCOUNTER — Ambulatory Visit (INDEPENDENT_AMBULATORY_CARE_PROVIDER_SITE_OTHER): Payer: 59 | Admitting: Family Medicine

## 2020-06-24 ENCOUNTER — Other Ambulatory Visit (HOSPITAL_COMMUNITY)
Admission: RE | Admit: 2020-06-24 | Discharge: 2020-06-24 | Disposition: A | Discharge status: Home / Self Care | Payer: 59 | Source: Ambulatory Visit | Attending: Family Medicine | Admitting: Family Medicine

## 2020-06-24 ENCOUNTER — Encounter: Payer: Self-pay | Admitting: Family Medicine

## 2020-06-24 ENCOUNTER — Other Ambulatory Visit: Payer: Self-pay

## 2020-06-24 VITALS — BP 117/77 | HR 63 | Wt 164.4 lb

## 2020-06-24 VITALS — BP 110/78 | Ht 62.0 in | Wt 168.0 lb

## 2020-06-24 DIAGNOSIS — Z113 Encounter for screening for infections with a predominantly sexual mode of transmission: Secondary | ICD-10-CM | POA: Insufficient documentation

## 2020-06-24 DIAGNOSIS — N898 Other specified noninflammatory disorders of vagina: Secondary | ICD-10-CM

## 2020-06-24 DIAGNOSIS — L732 Hidradenitis suppurativa: Secondary | ICD-10-CM | POA: Diagnosis not present

## 2020-06-24 DIAGNOSIS — R350 Frequency of micturition: Secondary | ICD-10-CM | POA: Insufficient documentation

## 2020-06-24 DIAGNOSIS — M67472 Ganglion, left ankle and foot: Secondary | ICD-10-CM

## 2020-06-24 LAB — POCT URINALYSIS DIP (MANUAL ENTRY)
Bilirubin, UA: NEGATIVE
Blood, UA: NEGATIVE
Glucose, UA: NEGATIVE mg/dL
Ketones, POC UA: NEGATIVE mg/dL
Leukocytes, UA: NEGATIVE
Nitrite, UA: NEGATIVE
Protein Ur, POC: NEGATIVE mg/dL
Spec Grav, UA: 1.025 (ref 1.010–1.025)
Urobilinogen, UA: 0.2 E.U./dL
pH, UA: 7 (ref 5.0–8.0)

## 2020-06-24 LAB — POCT WET PREP (WET MOUNT)
Clue Cells Wet Prep Whiff POC: NEGATIVE
Trichomonas Wet Prep HPF POC: ABSENT

## 2020-06-24 LAB — POCT UA - MICROSCOPIC ONLY

## 2020-06-24 LAB — POCT URINE PREGNANCY: Preg Test, Ur: NEGATIVE

## 2020-06-24 MED ORDER — LIDOCAINE-PRILOCAINE 2.5-2.5 % EX CREA: 1.0000 "application " | TOPICAL_CREAM | CUTANEOUS | 0 refills | Status: DC | PRN

## 2020-06-24 MED ORDER — LIDOCAINE-PRILOCAINE 2.5-2.5 % EX CREA: 1.0000 "application " | TOPICAL_CREAM | CUTANEOUS | 3 refills | Status: DC | PRN

## 2020-06-24 MED ORDER — CLINDAMYCIN PHOSPHATE 1 % EX GEL: Freq: Two times a day (BID) | CUTANEOUS | 3 refills | Status: DC

## 2020-06-24 NOTE — Assessment & Plan Note (Addendum)
Acute x 4 days. Pelvic exam notable for thicker white/grey discharge. Wet prep negative. GC/Cl pending. No signs of PID on exam. Urine pregnancy test negative. - follow up remaining labs and treat if indicated

## 2020-06-24 NOTE — Assessment & Plan Note (Signed)
Acute on chronic. Small nodule on left vulva without signs of acute infection. Patient requested "cream" she has been given in the past.  Per chart review, appears patient has been given Clindamycin gel BID PRN. Also called in Emla cream for acute painful flares. Discussed proper. Recommended sits baths and warm compresses. Return precautions discussed.

## 2020-06-24 NOTE — Progress Notes (Signed)
PCP: Richarda Osmond, DO  Subjective:   HPI: Patient is a 40 y.o. female here for left foot ganglion cyst.  Increasing in size over 2 months. Painful at rest; not worse with movement. No loss of sensation or numbness of foot. She has not tried anything for it. Mother with hx of ganglion cyst.   Past Medical History:  Diagnosis Date  . Endometriosis   . History of 2019 novel coronavirus disease (COVID-19)    pt tested positive 03-03-2019, results in care everywhere.  (05-22-2019 per pt had symptoms of fatigue, headache, and loss of taste/ smell;  fatigue and headache after one week and loss taste/ smell resolved after 3 wks)  . Infertility, female   . Ovarian cyst   . Pelvic mass in female    large fluid filled pelvic mass  . Recurrent boils   . Septate uterus   . Trichomoniasis     Current Outpatient Medications on File Prior to Visit  Medication Sig Dispense Refill  . albuterol (VENTOLIN HFA) 108 (90 Base) MCG/ACT inhaler Inhale 1-2 puffs into the lungs every 6 (six) hours as needed for wheezing or shortness of breath. 1 each 0  . benzonatate (TESSALON) 100 MG capsule Take 1 capsule (100 mg total) by mouth every 8 (eight) hours. 21 capsule 0  . promethazine-dextromethorphan (PROMETHAZINE-DM) 6.25-15 MG/5ML syrup Take 5 mLs by mouth 4 (four) times daily as needed for cough. 118 mL 0  . [DISCONTINUED] cetirizine (ZYRTEC ALLERGY) 10 MG tablet Take 1 tablet (10 mg total) by mouth daily. (Patient not taking: Reported on 03/21/2020) 30 tablet 0  . [DISCONTINUED] Norethindrone Acetate-Ethinyl Estrad-FE (LOESTRIN 24 FE) 1-20 MG-MCG(24) tablet Take 1 tablet by mouth daily. 28 tablet 11  . [DISCONTINUED] Norgestimate-Ethinyl Estradiol Triphasic (TRI-SPRINTEC) 0.18/0.215/0.25 MG-35 MCG tablet Take 1 tablet by mouth daily. 1 Package 11   No current facility-administered medications on file prior to visit.    Past Surgical History:  Procedure Laterality Date  . BREAST BIOPSY Right 1999    per pt benign   . LAPAROSCOPIC SALPINGOOPHERECTOMY Right 03-25-2010   @[facility]    and LYSIS ADHESIONS  . LAPAROSCOPY N/A 05/30/2019   Procedure: DIAGNOSTIC LAPAROSCOPY;  Surgeon: Woodroe Mode, MD;  Location: [facility];  Service: Gynecology;  Laterality: N/A;    Allergies  Allergen Reactions  . Sulfa Antibiotics Other (See Comments)    Upset stomache    Social History   Socioeconomic History  . Marital status: Single    Spouse name: Not on file  . Number of children: Not on file  . Years of education: Not on file  . Highest education level: Not on file  Occupational History  . Not on file  Tobacco Use  . Smoking status: Current Every Day Smoker    Packs/day: 0.25    Years: 15.00    Pack years: 3.75    Types: Cigarettes  . Smokeless tobacco: Never Used  Vaping Use  . Vaping Use: Never used  Substance and Sexual Activity  . Alcohol use: Yes    Comment: occasionally  . Drug use: Never  . Sexual activity: Yes  Other Topics Concern  . Not on file  Social History Narrative  . Not on file   Social Determinants of Health   Financial Resource Strain: Not on file  Food Insecurity: Food Insecurity Present  . Worried About Charity fundraiser in the Last Year: Sometimes true  . Ran Out of Food in the Last Year: Sometimes  true  Transportation Needs: No Transportation Needs  . Lack of Transportation (Medical): No  . Lack of Transportation (Non-Medical): No  Physical Activity: Not on file  Stress: Not on file  Social Connections: Not on file  Intimate Partner Violence: Not on file    Family History  Problem Relation Age of Onset  . Hypertension Mother   . Diabetes Mother   . Cancer Father   . Hypertension Father   . Diabetes Brother   . Hypertension Brother   . Breast cancer Neg Hx     BP 110/78   Ht 5\' 2"  (1.575 m)   Wt 168 lb (76.2 kg)   [name] 05/31/2020 (Exact Date)   BMI 30.73 kg/m   No flowsheet data found.  No flowsheet data  found.  Review of Systems: See HPI above.     Objective:  Physical Exam:  Gen: NAD, comfortable in exam room  Left Foot Obvious 1cm fluctuant mass on dorsolateral foot. No surrounding swelling or ecchymoses.  FROM without pain. TTP over cyst located on dorsal side 4th and 5th TMT area. NV intact distally.    Assessment & Plan:  1. Ganglion cyst Left ganglion cyst located over the dorsum of 4 & 5 TMT joints with ultrasound evidence of stalk going into TMT joint.  No vascularity of this. Painful at rest. Patient agreed to aspirate cyst today.   Plan: -Aspiration procedure -Follow up PRN or if cyst return  PROCEDURE After informed written consent timeout was performed, patient was sitting on exam table.  Left foot was prepped with alcohol swab. 72mL of lidocaine was used for local anesthesia at base of cyst.  Then using an 18g needle on 10cc syringe, scant amount of clear gel like fluid was aspirated from left foot cyst. Patient tolerated procedure well without immediate complications.  Post-procedure ultrasound confirmed complete collapse of cyst.

## 2020-06-24 NOTE — Assessment & Plan Note (Signed)
Acute x 4 days. No systemic symptoms. UA negative. - follow up urine culture

## 2020-06-25 LAB — CERVICOVAGINAL ANCILLARY ONLY
Chlamydia: NEGATIVE
Comment: NEGATIVE
Comment: NEGATIVE
Comment: NORMAL
Neisseria Gonorrhea: NEGATIVE
Trichomonas: NEGATIVE

## 2020-06-26 LAB — RPR: RPR Ser Ql: NONREACTIVE

## 2020-06-26 LAB — HIV ANTIBODY (ROUTINE TESTING W REFLEX): HIV Screen 4th Generation wRfx: NONREACTIVE

## 2020-06-26 LAB — HEPATITIS C ANTIBODY: Hep C Virus Ab: <0.1 s/co ratio (ref 0.0–0.9)

## 2020-06-27 ENCOUNTER — Ambulatory Visit: Payer: 59 | Admitting: Obstetrics & Gynecology

## 2020-06-27 ENCOUNTER — Telehealth: Payer: Self-pay

## 2020-06-27 LAB — URINE CULTURE

## 2020-06-27 NOTE — Telephone Encounter (Signed)
Patient calls nurse line requesting lab results. Patient advised she saw them on mychart and would like clarification on urine culture results. Patient advised of negative urine culture results and essentially negative tests all around. Patient still endorses dysuria, frequency, and little output. Patient is requesting something to alleviate symptoms. Will forward to provider who saw patient.

## 2020-06-27 NOTE — Telephone Encounter (Signed)
Called patient to further evaluate. She denied any further questions. Recommended she call if any questions or concerns.

## 2020-07-21 ENCOUNTER — Ambulatory Visit (HOSPITAL_COMMUNITY)
Admission: EM | Admit: 2020-07-21 | Discharge: 2020-07-21 | Disposition: A | Discharge status: Home / Self Care | Payer: 59 | Attending: Emergency Medicine | Admitting: Emergency Medicine

## 2020-07-21 ENCOUNTER — Encounter (HOSPITAL_COMMUNITY): Payer: Self-pay

## 2020-07-21 ENCOUNTER — Other Ambulatory Visit: Payer: Self-pay

## 2020-07-21 DIAGNOSIS — Z113 Encounter for screening for infections with a predominantly sexual mode of transmission: Secondary | ICD-10-CM | POA: Diagnosis present

## 2020-07-21 DIAGNOSIS — R109 Unspecified abdominal pain: Secondary | ICD-10-CM | POA: Diagnosis not present

## 2020-07-21 DIAGNOSIS — M545 Low back pain, unspecified: Secondary | ICD-10-CM | POA: Diagnosis present

## 2020-07-21 LAB — POCT URINALYSIS DIPSTICK, ED / UC
Bilirubin Urine: NEGATIVE
Glucose, UA: NEGATIVE mg/dL
Ketones, ur: NEGATIVE mg/dL
Leukocytes,Ua: NEGATIVE
Nitrite: NEGATIVE
Protein, ur: 30 mg/dL — AB
Specific Gravity, Urine: 1.025 (ref 1.005–1.030)
Urobilinogen, UA: 0.2 mg/dL (ref 0.0–1.0)
pH: 5.5 (ref 5.0–8.0)

## 2020-07-21 LAB — HIV ANTIBODY (ROUTINE TESTING W REFLEX): HIV Screen 4th Generation wRfx: NONREACTIVE

## 2020-07-21 LAB — POC URINE PREG, ED: Preg Test, Ur: NEGATIVE

## 2020-07-21 NOTE — Discharge Instructions (Addendum)
We will contact you with the results from your lab work and any additional treatment.    Make sure you drink plenty of fluids, especially water.  You can take AZO, cranberry pills, or pyridium for urinary symptom relief.   Do not have sex while taking undergoing treatment for STI.  Make sure that all of your partners get tested and treated.   Use a condom or other barrier method for all sexual encounters.    Return or go to the Emergency Department if symptoms worsen or do not improve in the next few days.

## 2020-07-21 NOTE — ED Triage Notes (Signed)
Pt in with c/o urinary frequency, abdominal & back pain x 4 days  Pt also requesting STD testing

## 2020-07-21 NOTE — ED Provider Notes (Signed)
[facility]    CSN: [id number] Arrival date & time: 07/21/20  1002      History   Chief Complaint Chief Complaint  Patient presents with   Back Pain   Abdominal Pain   Urinary Frequency    HPI [name] is a 40 y.o. female.   Patient here for evaluation of urinary frequency, abdominal cramping, and lower back pain that has been ongoing for the past 4 days.  Also reports some white discharge and some vaginal odor.  Requesting STI screen.  Denies any contact with STI positive partners.  Has not taken any OTC medication or treatments.  Denies any trauma, injury, or other precipitating event.  Denies any specific alleviating or aggravating factors.  Denies any fevers, chest pain, shortness of breath, N/V/D, numbness, tingling, weakness, or headaches.     The history is provided by the patient.  Back Pain Associated symptoms: abdominal pain   Abdominal Pain Associated symptoms: vaginal discharge   Associated symptoms: no vaginal bleeding   Urinary Frequency Associated symptoms include abdominal pain.   Past Medical History:  Diagnosis Date   Endometriosis    History of 2019 novel coronavirus disease (COVID-19)    pt tested positive 03-03-2019, results in care everywhere.  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   Menorrhagia 12/25/2019   Vaginal discharge 07/08/2019   GERD (gastroesophageal reflux disease) 06/07/2019   HSV-2 seropositive 03/07/2019   Hydrosalpinx 12/30/2018   Endometriosis 05/01/2011    SEPTATE UTERUS 07/24/2009   TOBACCO ABUSE 09/26/2008   Female infertility 09/26/2008   IRREGULAR MENSTRUATION 09/21/2006    Past Surgical History:  Procedure Laterality Date   BREAST BIOPSY Right 1999   per pt benign    LAPAROSCOPIC SALPINGOOPHERECTOMY Right 03-25-2010   @[facility]    and LYSIS ADHESIONS   LAPAROSCOPY N/A 05/30/2019   Procedure: DIAGNOSTIC LAPAROSCOPY;  Surgeon: Woodroe Mode, MD;  Location: [facility];  Service: Gynecology;  Laterality: N/A;    OB History     Gravida  0   Para      Term      Preterm      AB      Living  0      SAB      IAB      Ectopic      Multiple      Live Births               Home Medications    Prior to Admission medications   Medication Sig Start Date End Date Taking? Authorizing Provider  albuterol (VENTOLIN HFA) 108 (90 Base) MCG/ACT inhaler Inhale 1-2 puffs into the lungs every 6 (six) hours as needed for wheezing or shortness of breath. 05/28/20   Hazel Sams, PA-C  benzonatate (TESSALON) 100 MG capsule Take 1 capsule (100 mg total) by mouth every 8 (eight) hours. 05/28/20   Hazel Sams, PA-C  clindamycin (CLINDAGEL) 1 %  gel Apply topically 2 (two) times daily. 06/24/20   Mullis, Kiersten P, DO  lidocaine-prilocaine (EMLA) cream Apply 1 application topically as needed (Painful boils). 06/24/20   Mullis, Kiersten P, DO  promethazine-dextromethorphan (PROMETHAZINE-DM) 6.25-15 MG/5ML syrup Take 5 mLs by mouth 4 (four) times daily as needed for cough. 05/28/20   Hazel Sams, PA-C  cetirizine (ZYRTEC ALLERGY) 10 MG tablet Take 1 tablet (10 mg total) by mouth daily. Patient not taking: Reported on 03/21/2020 10/02/19 05/28/20  Jaynee Eagles, PA-C  Norethindrone Acetate-Ethinyl Estrad-FE (LOESTRIN 24 FE) 1-20 MG-MCG(24) tablet Take 1 tablet by mouth daily. 03/21/20 05/28/20  Lavonia Drafts, MD  Norgestimate-Ethinyl Estradiol Triphasic (TRI-SPRINTEC) 0.18/0.215/0.25 MG-35 MCG tablet Take 1 tablet by  mouth daily. 11/03/10 05/01/11  Woodroe Mode, MD    Family History Family History  Problem Relation Age of Onset   Hypertension Mother    Diabetes Mother    Cancer Father    Hypertension Father    Diabetes Brother    Hypertension Brother    Breast cancer Neg Hx     Social History Social History   Tobacco Use   Smoking status: Every Day    Packs/day: 0.25    Years: 15.00    Pack years: 3.75    Types: Cigarettes   Smokeless tobacco: Never  Vaping Use   Vaping Use: Never used  Substance Use Topics   Alcohol use: Yes    Comment: occasionally   Drug use: Never     Allergies   Sulfa antibiotics   Review of Systems Review of Systems  Gastrointestinal:  Positive for abdominal pain.  Genitourinary:  Positive for flank pain, frequency and vaginal discharge. Negative for decreased urine volume, vaginal bleeding and vaginal pain.  Musculoskeletal:  Positive for back pain.  All other systems reviewed and are negative.   Physical Exam Triage Vital Signs ED Triage Vitals  Enc Vitals Group     BP 07/21/20 1011 116/71     Pulse Rate 07/21/20 1011 91     Resp 07/21/20 1011 17     Temp 07/21/20 1011 99.6 F (37.6 C)     Temp Source 07/21/20 1011 Oral     SpO2 07/21/20 1011 100 %     Weight --      Height --      Head Circumference --      Peak Flow --      Pain Score 07/21/20 1009 4     Pain Loc --      Pain Edu? --      Excl. in [place]? --    No data found.  Updated Vital Signs BP 116/71 (BP Location: Right Arm)   Pulse 91   Temp 99.6 F (37.6 C) (Oral)   Resp 17   [name] 07/05/2020 (Exact Date)   SpO2 100%   Visual Acuity Right Eye Distance:   Left Eye Distance:   Bilateral Distance:    Right Eye Near:   Left Eye Near:    Bilateral Near:     Physical Exam Vitals and nursing note reviewed.  Constitutional:      General: She is not in acute distress.    Appearance: Normal appearance. She is not ill-appearing, toxic-appearing or diaphoretic.  HENT:      Head: Normocephalic and atraumatic.  Eyes:     Conjunctiva/sclera: Conjunctivae normal.  Cardiovascular:     Rate and Rhythm: Normal rate.     Pulses: Normal pulses.  Pulmonary:  Effort: Pulmonary effort is normal.  Abdominal:     General: Abdomen is flat.     Palpations: Abdomen is soft.     Tenderness: There is no abdominal tenderness. There is no right CVA tenderness or left CVA tenderness.  Genitourinary:    Comments: declines Musculoskeletal:        General: Normal range of motion.     Cervical back: Normal range of motion.  Skin:    General: Skin is warm and dry.  Neurological:     General: No focal deficit present.     Mental Status: She is alert and oriented to person, place, and time.  Psychiatric:        Mood and Affect: Mood normal.     UC Treatments / Results  Labs (all labs ordered are listed, but only abnormal results are displayed) Labs Reviewed  POCT URINALYSIS DIPSTICK, ED / UC - Abnormal; Notable for the following components:      Result Value   Hgb urine dipstick TRACE (*)    Protein, ur 30 (*)    All other components within normal limits  URINE CULTURE  RPR  HIV ANTIBODY (ROUTINE TESTING W REFLEX)  POC URINE PREG, ED  CERVICOVAGINAL ANCILLARY ONLY    EKG   Radiology No results found.  Procedures Procedures (including critical care time)  Medications Ordered in UC Medications - No data to display  Initial Impression / Assessment and Plan / UC Course  I have reviewed the triage vital signs and the nursing notes.  Pertinent labs & imaging results that were available during my care of the patient were reviewed by me and considered in my medical decision making (see chart for details).    Assessment negative for red flags or concerns.  Urinalysis positive for trace blood and protein but otherwise negative.  Pregnancy test negative. Urine culture pending.  Self swab obtained and will treat based on results.  RPR and HIV lab work pending.   Encourage fluid intake especially water.  May take AZO, cranberry pills, or Pyridium as needed for urinary symptom relief.  Discussed safe sex practices including condom or other barrier medications.  Follow-up with primary care as needed Final Clinical Impressions(s) / UC Diagnoses   Final diagnoses:  Abdominal cramping  Acute midline low back pain without sciatica  Screen for STD (sexually transmitted disease)     Discharge Instructions      We will contact you with the results from your lab work and any additional treatment.    Make sure you drink plenty of fluids, especially water.  You can take AZO, cranberry pills, or pyridium for urinary symptom relief.   Do not have sex while taking undergoing treatment for STI.  Make sure that all of your partners get tested and treated.   Use a condom or other barrier method for all sexual encounters.    Return or go to the Emergency Department if symptoms worsen or do not improve in the next few days.      ED Prescriptions   None    PDMP not reviewed this encounter.   Pearson Forster, NP 07/21/20 1041

## 2020-07-22 ENCOUNTER — Telehealth (HOSPITAL_COMMUNITY): Payer: Self-pay | Admitting: Emergency Medicine

## 2020-07-22 LAB — CERVICOVAGINAL ANCILLARY ONLY
Bacterial Vaginitis (gardnerella): POSITIVE — AB
Candida Glabrata: NEGATIVE
Candida Vaginitis: NEGATIVE
Chlamydia: NEGATIVE
Comment: NEGATIVE
Comment: NEGATIVE
Comment: NEGATIVE
Comment: NEGATIVE
Comment: NEGATIVE
Comment: NORMAL
Neisseria Gonorrhea: NEGATIVE
Trichomonas: NEGATIVE

## 2020-07-22 LAB — URINE CULTURE: Culture: <10000 — AB

## 2020-07-22 LAB — RPR: RPR Ser Ql: NONREACTIVE

## 2020-07-22 MED ORDER — METRONIDAZOLE 500 MG PO TABS: 500.0000 mg | ORAL_TABLET | Freq: Two times a day (BID) | ORAL | 0 refills | Status: DC

## 2020-07-31 ENCOUNTER — Other Ambulatory Visit: Payer: Self-pay

## 2020-07-31 ENCOUNTER — Encounter: Payer: Self-pay | Admitting: General Practice

## 2020-07-31 ENCOUNTER — Encounter: Payer: Self-pay | Admitting: Obstetrics & Gynecology

## 2020-07-31 ENCOUNTER — Ambulatory Visit (INDEPENDENT_AMBULATORY_CARE_PROVIDER_SITE_OTHER): Payer: 59 | Admitting: Obstetrics & Gynecology

## 2020-07-31 VITALS — BP 106/71 | HR 71 | Temp 98.5°F | Wt 165.0 lb

## 2020-07-31 DIAGNOSIS — N7011 Chronic salpingitis: Secondary | ICD-10-CM

## 2020-07-31 DIAGNOSIS — N946 Dysmenorrhea, unspecified: Secondary | ICD-10-CM | POA: Diagnosis not present

## 2020-07-31 DIAGNOSIS — R102 Pelvic and perineal pain: Secondary | ICD-10-CM | POA: Diagnosis not present

## 2020-07-31 MED ORDER — IBUPROFEN 600 MG PO TABS: 600.0000 mg | ORAL_TABLET | Freq: Four times a day (QID) | ORAL | 3 refills | Status: DC | PRN

## 2020-07-31 NOTE — Addendum Note (Signed)
Addended by: Valentina Lucks on: 07/31/2020 02:34 PM   Modules accepted: Orders

## 2020-07-31 NOTE — Patient Instructions (Signed)
Dysmenorrhea °Dysmenorrhea refers to cramps caused by the muscles of the uterus tightening (contracting) during a menstrual period. Dysmenorrhea may be mild, or it may be severe enough to interfere with everyday activities for a few days each month. Primary dysmenorrhea is menstrual cramps that last a couple of days when a female starts having menstrual periods or soon after. As a female gets older or has a baby, the cramps will usually lessen or disappear. °Secondary dysmenorrhea begins later in life and is caused by a disorder in the reproductive system. It lasts longer, and it may cause more pain than primary dysmenorrhea. The pain may start before the period and last a few days after the period. °What are the causes? °Dysmenorrhea is usually caused by an underlying problem, such as: °Endometriosis. The tissue that lines the uterus (endometrium) growing outside of the uterus in other areas of the body. °Adenomyosis. Endometrial tissue growing into the muscular walls of the uterus. °Pelvic congestive syndrome. Blood vessels in the pelvis that fill with blood just before the menstrual period. °Overgrowth of cells (polyps) in the endometrium or the lower part of the uterus (cervix). °Uterine prolapse. The uterus dropping down into the vagina due to stretched or weak muscles. °Bladder problems, such as infection or inflammation. °Intestinal problems, such as a tumor or irritable bowel syndrome. °Cancer of the reproductive organs or bladder. °Other causes of this condition may result from: °A severely tipped uterus. °A cervix that is closed or has a small opening. °Noncancerous (benign) tumors in the uterus (fibroids). °Pelvic inflammatory disease (PID). °Pelvic scarring (adhesions) from a previous surgery. °An ovarian cyst. °An IUD (intrauterine device). °What increases the risk? °You are more likely to develop this condition if: °You are younger than 41 years old. °You started puberty early. °You have irregular or  heavy bleeding. °You have never given birth. °You have a family history of dysmenorrhea. °You smoke or use nicotine products. °You have high body weight or a low body weight. °What are the signs or symptoms? °Symptoms of this condition include: °Cramping, throbbing pain in lower abdomen or lower back, or a feeling of fullness in the lower abdomen. °Periods lasting for longer than 7 days. °Headaches. °Bloating. °Fatigue. °Nausea or vomiting. °Diarrhea or loose stools. °Sweating or dizziness. °How is this diagnosed? °This condition may be diagnosed based on: °Your symptoms. °Your medical history. °A physical exam. °Blood tests. °A Pap test. This is a test in which cells from the cervix are tested for signs of cancer or infection. °A pregnancy test. °You may also have other tests, including: °Imaging tests, such as: °Ultrasound. °A procedure to remove and examine a sample of endometrial tissue (dilation and curettage, D&C). °A procedure to visually examine the inside of: °The uterus (hysteroscopy). °The abdomen or pelvis (laparoscopy). °The bladder (cystoscopy). °X-rays. °CT scan. °MRI. °How is this treated? °Treatment depends on the cause of the dysmenorrhea. Treatment may include medicines, such as: °Pain medicines. °Hormone replacement therapy. °Injections of progesterone to stop the menstrual period. °Birth control pills that contain the hormone progesterone. °An IUD that contains the hormone progesterone. °NSAIDs, such as ibuprofen. These may help to stop the production of hormones that cause cramps. °Antidepressant medicines. °Other treatment may include: °Surgery to remove adhesions, endometriosis, ovarian cysts, fibroids, or the entire uterus (hysterectomy). °Endometrial ablation. This is a procedure to destroy the endometrium. °Presacral neurectomy. This is a procedure to cut the nerves in the bottom of the spine (sacrum) that go to the reproductive organs. °Sacral nerve   stimulation. This is a procedure to  apply an electric current to nerves in the sacrum. °Exercise and physical therapy. °Meditation, yoga, and acupuncture. °Work with your health care provider to determine what treatment or combination of treatments is best for you. °Follow these instructions at home: °Relieving pain and cramping ° °If directed, apply heat to your lower back or abdomen when you experience pain or cramps. Use the heat source that your health care provider recommends, such as a moist heat pack or a heating pad. °Place a towel between your skin and the heat source. °Leave the heat on for 20-30 minutes. °Remove the heat if your skin turns bright red. This is especially important if you are unable to feel pain, heat, or cold. You may have a greater risk of getting burned. °Do not sleep with a heating pad on. °Exercise. Activities such as walking, swimming, or biking can help to relieve cramps. °Massage your lower back or abdomen to help relieve pain. °General instructions °Take over-the-counter and prescription medicines only as told by your health care provider. °Ask your health care provider if the medicine prescribed to you requires you to avoid driving or using machinery. °Avoid alcohol and caffeine during and right before your period. These can make cramps worse. °Do not use any products that contain nicotine or tobacco. These products include cigarettes, chewing tobacco, and vaping devices, such as e-cigarettes. If you need help quitting, ask your health care provider. °Keep all follow-up visits. This is important. °Contact a health care provider if: °You have pain that gets worse or does not get better with medicine. °You have pain with sex. °You develop nausea or vomiting with your period that is not controlled with medicine. °Get help right away if: °You faint. °Summary °Dysmenorrhea refers to cramps caused by the muscles of the uterus tightening (contracting) during a menstrual period. °Dysmenorrhea may be mild, or it may be  severe enough to interfere with everyday activities for a few days each month. °Treatment depends on the cause of the dysmenorrhea. °Work with your health care provider to determine what treatment or combination of treatments is best for you. °This information is not intended to replace advice given to you by your health care provider. Make sure you discuss any questions you have with your health care provider. °Document Revised: 09/13/2019 Document Reviewed: 09/13/2019 °Elsevier Patient Education © 2022 [employer] ° °

## 2020-07-31 NOTE — Progress Notes (Signed)
History:  40 y.o. G0P0 here today for f/u of pelvic pain and hydrosalpinx. [name] currently. Pt reports that her pain is mainly with her cycle however on this month she reports pain that occurred a few days prior to her menses. She reports that she takes 'Advil' which helps some but, she took a '800mg  Motrin' of her brothers that relieved her pain for almost the entire day. Pt denies any other new sx.     The following portions of the patient's history were reviewed and updated as appropriate: allergies, current medications, past family history, past medical history, past social history, past surgical history and problem list.  Review of Systems:  Pertinent items are noted in HPI.    Objective:  Physical Exam Blood pressure 106/71, pulse 71, temperature 98.5 F (36.9 C), weight 165 lb (74.8 kg), last menstrual period 07/29/2020.  CONSTITUTIONAL: Well-developed, well-nourished female in no acute distress.  HENT:  Normocephalic, atraumatic EYES: Conjunctivae and EOM are normal. No scleral icterus.  NECK: Normal range of motion SKIN: Skin is warm and dry. No rash noted. Not diaphoretic.No pallor. [place]: Alert and oriented to person, place, and time. Normal coordination.  Abd: Soft, nontender and nondistended Pelvic: deferred. Pt on menses  Labs and Imaging  07/26/2019 CLINICAL DATA:  Pelvic pain in a female, history endometriosis, prior RIGHT oophorectomy, septate uterus, RIGHT pelvic mass on prior MR   EXAM: TRANSABDOMINAL AND TRANSVAGINAL ULTRASOUND OF PELVIS   TECHNIQUE: Both transabdominal and transvaginal ultrasound examinations of the pelvis were performed. Transabdominal technique was performed for global imaging of the pelvis including uterus, ovaries, adnexal regions, and pelvic cul-de-sac. It was necessary to proceed with endovaginal exam following the transabdominal exam to visualize the endometrium and adnexa.   COMPARISON:  Pelvic ultrasound 12/29/2018, MR pelvis  04/11/2019   FINDINGS: Uterus   Measurements: 9.0 x 4.6 x 6.0 cm = volume: 124 mL. Anteverted. Septate morphology. Submucosal leiomyoma at anterior upper uterus 3.0 x 2.7 x 2.4 cm. Additional leiomyomata anterior mid uterus submucosal 2.0 x 1.9 x 1.7 cm and intramural posteriorly mid uterus 0.9 x 0.9 x 0.8 cm.   Endometrium   Thickness: 15 mm. Septate morphology. No endometrial fluid or focal mass   Right ovary   Surgically absent by history   Left ovary   Measurements: 4.5 x 2.5 x 2.6 cm = volume: 10 mL. Normal morphology without mass   Other findings   Tubular cystic structure identified in RIGHT adnexa 11 x 5 x 5 cm, appearance similar to that seen on the prior MR exam though increased in size since prior study. Appearance favors hydrosalpinx. No free pelvic fluid.   IMPRESSION: Septate uterus.   Multiple uterine leiomyomata including 2 submucosal leiomyomata at anterior uterus.   Post RIGHT oophorectomy.   Suspected hydrosalpinx in RIGHT adnexa, slightly increased in size since prior MR exam. Assessment & Plan:  [place] was seen today for follow-up.  Diagnoses and all orders for this visit:  Pelvic pain -     ibuprofen (ADVIL) 600 MG tablet; Take 1 tablet (600 mg total) by mouth every 6 (six) hours as needed.  Hydrosalpinx -     Cancel: US PELVIS TRANSVAGINAL NON-OB (TV ONLY); Future  Dysmenorrhea -     ibuprofen (ADVIL) 600 MG tablet; Take 1 tablet (600 mg total) by mouth every 6 (six) hours as needed.  F/u via MyChart or via telephone.   In person f/u in 1 year or sooner prn  Tenesia Escudero L. Harraway-Smith, M.D., Cherlynn June

## 2020-08-09 ENCOUNTER — Ambulatory Visit (HOSPITAL_COMMUNITY)
Admission: RE | Admit: 2020-08-09 | Discharge: 2020-08-09 | Disposition: A | Discharge status: Home / Self Care | Payer: 59 | Source: Ambulatory Visit | Attending: Obstetrics & Gynecology | Admitting: Obstetrics & Gynecology

## 2020-08-09 ENCOUNTER — Other Ambulatory Visit: Payer: Self-pay

## 2020-08-09 ENCOUNTER — Other Ambulatory Visit: Payer: Self-pay | Admitting: Obstetrics & Gynecology

## 2020-08-09 DIAGNOSIS — N7011 Chronic salpingitis: Secondary | ICD-10-CM

## 2020-08-09 DIAGNOSIS — N946 Dysmenorrhea, unspecified: Secondary | ICD-10-CM

## 2020-08-09 DIAGNOSIS — R102 Pelvic and perineal pain: Secondary | ICD-10-CM | POA: Insufficient documentation

## 2020-08-12 ENCOUNTER — Telehealth (HOSPITAL_COMMUNITY): Payer: Self-pay | Admitting: Emergency Medicine

## 2020-08-12 MED ORDER — METRONIDAZOLE 0.75 % VA GEL: 1.0000 | Freq: Every day | VAGINAL | 0 refills | Status: AC

## 2020-08-12 NOTE — Telephone Encounter (Signed)
Patient states she requested gel from results on 6/12.  Patient made aware of need to call within 7 days for changes for future needs

## 2020-08-15 NOTE — Progress Notes (Signed)
SUBJECTIVE:   CHIEF COMPLAINT / HPI:   [name] is a 40 y.o. female presents for abdominal pain, constipation, hidradenitis and STD testing   Abdominal pain  Abdominal pain started 3 days ago. Pain is generalized, worse in the centre of her abdomen after eating. Onset was sudden.  Denies radiation.  Described as both sharp and dull pain. Constant, but comes and goes at x 2. Not alleviated by Tylenol, Motrin or heating pad.  Exacerbated by eating anything type of food. Severity 6/10.  Associated symptoms include nausea and constipation. Pt is sexually active with 1 female partner, no change in sexual partners.   Constipation Pt endorses constipation over the last few days. She has been taking prune juice. Last BM was this morning and it was loose in consistency.  Denies fevers, weight loss, nausea, rectal bleeding, melena, hematochezia,,dysuria, frequency, urgency or hematuria.    Hidradenitis Patient endorses worsening of her hidradenitis over the last 5 days.  She has noticed erythematous swellings bilaterally which are painful.  She is requesting a medication for this.  She has clindamycin gel at home which she has tried previously but not recently.  STD testing Patient requesting STD testing today.[name] June 20th and denies taking birth control.  Patient requested that I explained her recent pelvic ultrasound results to her.  I explained to the patient that I would only be able to address 1-2 problems at this visit due to time being limiting factor and having to see a number of patients after her.  Patient was adamant that I addressed all of her concerns today and that she has never heard of a doctor not addressing all concerns and a doctor's visit.Marland Kitchen  Despite trying to agenda set with her with her most important concerns, she was still adamant that I addressed them all.  She also threatened to speak to my [name] fundraiser" if I was not able to address all of her concerns.  PERTINENT  PMH /  PSH: GERD, endometriosis   OBJECTIVE:   BP 106/76   Pulse 96   Ht 5\' 2"  (1.575 m)   Wt 164 lb (74.4 kg)   [name] 07/29/2020 (Exact Date)   SpO2 100%   BMI 30.00 kg/m    General: Alert, no acute distress Cardio: well perfused  Pulm: normal work of breathing Abdomen: Bowel sounds normal. Abdomen soft, nondistended, generalized mild tenderness  Neuro: Cranial nerves grossly intact        Pelvic Exam chaperoned by CMA [name]         External: normal female genitalia without lesions or masses        Vagina: normal without lesions or masses, cottage cheese discharge noted        Cervix: normal without lesions or masses        Pap smear: performed        Samples for Wet prep, GC/Chlamydia/trichomonas obtained   ASSESSMENT/PLAN:   Hydradenitis Prescribed doxycycline 100 mg twice daily for 7 days.  Recommended that in the future patient can use clindamycin gel when she first noticed the hidradenitis flaring up.  Follow-up with PCP as needed.  ER precautions given to patient if lesions along get larger.  Vaginal discharge Wet prep positive for yeast and cottage cheeselike discharge noted on pelvic exam.  Urine pregnancy negative.  Will send in Diflucan.  Also obtained gonorrhea, chlamydia and trichomonas.   Abdominal pain Unclear cause of patient's abdominal pain.  Urine pregnancy negative, UA negative today.  Mild generalized pain, no guarding. The pain is most likely related to constipation which patient recently endorses having.  Also considered UTI, endometriosis, appendicitis, STDs, hydrosalpinx, ectopic pregnancy, IBS etc as causes of her pain however much less likely given history and physical today.  Per patient's request will obtain labs-CBC and CMP. For the pain recommended Motrin and Tylenol.  Follow-up with PCP if persistent symptoms.  ER precautions given to patient.  Constipation Likely causing her abdominal pain. Recommended high-fiber diet, prune juice, making plenty of  fluids etc.      Lattie Haw, MD PGY-3 [place]

## 2020-08-16 ENCOUNTER — Ambulatory Visit (INDEPENDENT_AMBULATORY_CARE_PROVIDER_SITE_OTHER): Payer: 59 | Admitting: Family Medicine

## 2020-08-16 ENCOUNTER — Other Ambulatory Visit (HOSPITAL_COMMUNITY)
Admission: RE | Admit: 2020-08-16 | Discharge: 2020-08-16 | Disposition: A | Discharge status: Home / Self Care | Payer: 59 | Source: Ambulatory Visit | Attending: Family Medicine | Admitting: Family Medicine

## 2020-08-16 ENCOUNTER — Encounter: Payer: Self-pay | Admitting: Student

## 2020-08-16 ENCOUNTER — Other Ambulatory Visit: Payer: Self-pay

## 2020-08-16 VITALS — BP 106/76 | HR 96 | Ht 62.0 in | Wt 164.0 lb

## 2020-08-16 DIAGNOSIS — R109 Unspecified abdominal pain: Secondary | ICD-10-CM | POA: Insufficient documentation

## 2020-08-16 DIAGNOSIS — K5900 Constipation, unspecified: Secondary | ICD-10-CM | POA: Insufficient documentation

## 2020-08-16 DIAGNOSIS — N898 Other specified noninflammatory disorders of vagina: Secondary | ICD-10-CM

## 2020-08-16 DIAGNOSIS — L732 Hidradenitis suppurativa: Secondary | ICD-10-CM

## 2020-08-16 LAB — POCT WET PREP (WET MOUNT)
Clue Cells Wet Prep Whiff POC: NEGATIVE
Trichomonas Wet Prep HPF POC: ABSENT

## 2020-08-16 LAB — POCT URINALYSIS DIP (MANUAL ENTRY)
Bilirubin, UA: NEGATIVE
Blood, UA: NEGATIVE
Glucose, UA: NEGATIVE mg/dL
Leukocytes, UA: NEGATIVE
Nitrite, UA: NEGATIVE
Protein Ur, POC: NEGATIVE mg/dL
Spec Grav, UA: 1.02 (ref 1.010–1.025)
Urobilinogen, UA: 0.2 E.U./dL
pH, UA: 8.5 — AB (ref 5.0–8.0)

## 2020-08-16 LAB — POCT URINE PREGNANCY: Preg Test, Ur: NEGATIVE

## 2020-08-16 MED ORDER — DOXYCYCLINE HYCLATE 100 MG PO TABS: 100.0000 mg | ORAL_TABLET | Freq: Two times a day (BID) | ORAL | 0 refills | Status: AC

## 2020-08-16 MED ORDER — FLUCONAZOLE 150 MG PO TABS: 150.0000 mg | ORAL_TABLET | Freq: Once | ORAL | 0 refills | Status: AC

## 2020-08-16 NOTE — Assessment & Plan Note (Addendum)
Unclear cause of patient's abdominal pain.  Urine pregnancy negative, UA negative today. Mild generalized pain, no guarding. The pain is most likely related to constipation which patient recently endorses having.  Also considered UTI, endometriosis, appendicitis, STDs, hydrosalpinx, ectopic pregnancy, IBS etc as causes of her pain however much less likely given history and physical today.  Per patient's request will obtain labs-CBC and CMP. For the pain recommended Motrin and Tylenol.  Follow-up with PCP if persistent symptoms.  ER precautions given to patient.

## 2020-08-16 NOTE — Assessment & Plan Note (Signed)
Likely causing her abdominal pain. Recommended high-fiber diet, prune juice, making plenty of fluids etc.

## 2020-08-16 NOTE — Assessment & Plan Note (Addendum)
Prescribed doxycycline 100 mg twice daily for 7 days.  Recommended that in the future patient can use clindamycin gel when she first noticed the hidradenitis flaring up.  Follow-up with PCP as needed.  ER precautions given to patient if lesions along get larger.

## 2020-08-16 NOTE — Patient Instructions (Signed)
Thank you for coming to see me today. It was a pleasure. Today we discussed your belly pains. I think they could be due to constipation. You do not have a UTI. Continue the prune juice and see if this helps. If the pain gets worse then go to the ER.  For the armpit pain i I recommend doxcycline 100mg  twice a day for 7 days. If the boils get larger go to the urgent care/ER or come back as they may need to be lanced.  We performed STD testing today. This will take a few days to come back. If your MyChart is activated, we will message you on there if everything is normal, otherwise we will call. If we need to treat something we will also call you. If you do not hear from [place] in the next 4 days, please give [place] a call.   Please follow-up with PCP as needed.   If you have any questions or concerns, please do not hesitate to call the office at [phone].  Best wishes,   Dr Posey Pronto

## 2020-08-16 NOTE — Assessment & Plan Note (Addendum)
Wet prep positive for yeast and cottage cheeselike discharge noted on pelvic exam.  Urine pregnancy negative.  Will send in Diflucan.  Also obtained gonorrhea, chlamydia and trichomonas.

## 2020-08-17 LAB — COMPREHENSIVE METABOLIC PANEL
ALT: 8 IU/L (ref 0–32)
AST: 17 IU/L (ref 0–40)
Albumin/Globulin Ratio: 1.8 (ref 1.2–2.2)
Albumin: 4.5 g/dL (ref 3.8–4.8)
Alkaline Phosphatase: 68 IU/L (ref 44–121)
BUN/Creatinine Ratio: 10 (ref 9–23)
BUN: 8 mg/dL (ref 6–24)
Bilirubin Total: <0.2 mg/dL (ref 0.0–1.2)
CO2: 22 mmol/L (ref 20–29)
Calcium: 9.4 mg/dL (ref 8.7–10.2)
Chloride: 101 mmol/L (ref 96–106)
Creatinine, Ser: 0.83 mg/dL (ref 0.57–1.00)
Globulin, Total: 2.5 g/dL (ref 1.5–4.5)
Glucose: 79 mg/dL (ref 65–99)
Potassium: 4.1 mmol/L (ref 3.5–5.2)
Sodium: 137 mmol/L (ref 134–144)
Total Protein: 7 g/dL (ref 6.0–8.5)
eGFR: 91 mL/min/{1.73_m2} (ref >59)

## 2020-08-17 LAB — CBC
Hematocrit: 39.1 % (ref 34.0–46.6)
Hemoglobin: 12.6 g/dL (ref 11.1–15.9)
MCH: 27.8 pg (ref 26.6–33.0)
MCHC: 32.2 g/dL (ref 31.5–35.7)
MCV: 86 fL (ref 79–97)
Platelets: 312 10*3/uL (ref 150–450)
RBC: 4.53 x10E6/uL (ref 3.77–5.28)
RDW: 14.3 % (ref 11.7–15.4)
WBC: 8.1 10*3/uL (ref 3.4–10.8)

## 2020-08-19 ENCOUNTER — Telehealth: Payer: Self-pay | Admitting: Student

## 2020-08-19 ENCOUNTER — Encounter (HOSPITAL_COMMUNITY): Payer: Self-pay

## 2020-08-19 ENCOUNTER — Ambulatory Visit (HOSPITAL_COMMUNITY)
Admission: EM | Admit: 2020-08-19 | Discharge: 2020-08-19 | Disposition: A | Discharge status: Home / Self Care | Payer: 59 | Attending: Emergency Medicine | Admitting: Emergency Medicine

## 2020-08-19 DIAGNOSIS — L02411 Cutaneous abscess of right axilla: Secondary | ICD-10-CM | POA: Diagnosis not present

## 2020-08-19 LAB — CERVICOVAGINAL ANCILLARY ONLY
Chlamydia: NEGATIVE
Comment: NEGATIVE
Comment: NORMAL
Neisseria Gonorrhea: NEGATIVE

## 2020-08-19 MED ORDER — CEPHALEXIN 500 MG PO CAPS: 1000.0000 mg | ORAL_CAPSULE | Freq: Two times a day (BID) | ORAL | 0 refills | Status: DC

## 2020-08-19 NOTE — ED Provider Notes (Signed)
[facility]    CSN: [id number] Arrival date & time: 08/19/20  0802      History   Chief Complaint Chief Complaint  Patient presents with   Abscess    HPI [name] is a 40 y.o. female.   Patient presents with abscess to the right axilla for 4 days, tender and swollen.  Painful to lift arm above pain.  Was prescribed doxycycline by primary care provider, patient did not complete medication course, medication has given her headaches and made her nauseous.  Abscess has not begun to drain.  Denies any fever or chills.  History of hidradenitis  Past Medical History:  Diagnosis Date   Endometriosis    History of 2019 novel coronavirus disease (COVID-19)    pt tested positive 03-03-2019, results in care everywhere.  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   Menorrhagia 12/25/2019   Vaginal discharge 07/08/2019   GERD (gastroesophageal reflux disease) 06/07/2019   HSV-2 seropositive 03/07/2019   Hydrosalpinx 12/30/2018   Endometriosis 05/01/2011   SEPTATE UTERUS 07/24/2009   TOBACCO ABUSE 09/26/2008   Female infertility 09/26/2008   IRREGULAR MENSTRUATION 09/21/2006    Past Surgical History:  Procedure Laterality Date   BREAST BIOPSY Right 1999   per pt benign    LAPAROSCOPIC SALPINGOOPHERECTOMY Right 03-25-2010   @[facility]    and LYSIS ADHESIONS   LAPAROSCOPY N/A 05/30/2019   Procedure: DIAGNOSTIC LAPAROSCOPY;  Surgeon: Woodroe Mode, MD;  Location: [facility];  Service: Gynecology;  Laterality: N/A;    OB History     Gravida  0   Para      Term      Preterm      AB      Living  0      SAB      IAB      Ectopic      Multiple      Live Births               Home Medications    Prior to Admission medications   Medication Sig Start Date End Date Taking? Authorizing Provider  cephALEXin (KEFLEX) 500 MG capsule Take 2 capsules (1,000 mg total) by mouth 2 (two) times daily. 08/19/20  Yes Tyron Manetta R, NP  albuterol (VENTOLIN HFA) 108 (90 Base) MCG/ACT inhaler Inhale 1-2 puffs into the lungs every 6 (six) hours as needed for wheezing or shortness of breath. Patient not taking: Reported on 07/31/2020 05/28/20   Hazel Sams, PA-C  benzonatate (TESSALON) 100 MG capsule Take 1 capsule (100 mg total) by mouth every 8 (eight) hours. Patient not taking: Reported on 07/31/2020 05/28/20   Hazel Sams, PA-C  clindamycin (CLINDAGEL) 1 % gel Apply topically 2 (two) times daily. Patient not taking: Reported on 07/31/2020 06/24/20   Mina Marble P, DO  doxycycline (VIBRA-TABS) 100 MG tablet Take 1 tablet (100  mg total) by mouth 2 (two) times daily for 7 days. 08/16/20 08/23/20  Lattie Haw, MD  ibuprofen (ADVIL) 600 MG tablet Take 1 tablet (600 mg total) by mouth every 6 (six) hours as needed. 07/31/20   Lavonia Drafts, MD  lidocaine-prilocaine (EMLA) cream Apply 1 application topically as needed (Painful boils). Patient not taking: Reported on 07/31/2020 06/24/20   Mina Marble P, DO  metroNIDAZOLE (FLAGYL) 500 MG tablet Take 1 tablet (500 mg total) by mouth 2 (two) times daily. Patient not taking: Reported on 07/31/2020 07/22/20   Chase Picket, MD  promethazine-dextromethorphan (PROMETHAZINE-DM) 6.25-15 MG/5ML syrup Take 5 mLs by mouth 4 (four) times daily as needed for cough. Patient not taking: Reported on 07/31/2020 05/28/20   Hazel Sams, PA-C  cetirizine (ZYRTEC ALLERGY) 10 MG tablet Take 1  tablet (10 mg total) by mouth daily. Patient not taking: Reported on 03/21/2020 10/02/19 05/28/20  Jaynee Eagles, PA-C  Norethindrone Acetate-Ethinyl Estrad-FE (LOESTRIN 24 FE) 1-20 MG-MCG(24) tablet Take 1 tablet by mouth daily. 03/21/20 05/28/20  Lavonia Drafts, MD  Norgestimate-Ethinyl Estradiol Triphasic (TRI-SPRINTEC) 0.18/0.215/0.25 MG-35 MCG tablet Take 1 tablet by mouth daily. 11/03/10 05/01/11  Woodroe Mode, MD    Family History Family History  Problem Relation Age of Onset   Hypertension Mother    Diabetes Mother    Cancer Father    Hypertension Father    Diabetes Brother    Hypertension Brother    Breast cancer Neg Hx     Social History Social History   Tobacco Use   Smoking status: Every Day    Packs/day: 0.25    Years: 15.00    Pack years: 3.75    Types: Cigarettes   Smokeless tobacco: Never  Vaping Use   Vaping Use: Never used  Substance Use Topics   Alcohol use: Yes    Comment: occasionally   Drug use: Never     Allergies   Metronidazole and Sulfa antibiotics   Review of Systems Review of Systems  Constitutional: Negative.   Respiratory: Negative.    Cardiovascular: Negative.   Skin:  Positive for wound. Negative for color change, pallor and rash.  Neurological: Negative.     Physical Exam Triage Vital Signs ED Triage Vitals  Enc Vitals Group     BP 08/19/20 0820 118/74     Pulse Rate 08/19/20 0820 90     Resp 08/19/20 0820 18     Temp 08/19/20 0820 98.2 F (36.8 C)     Temp Source 08/19/20 0820 Oral     SpO2 08/19/20 0820 100 %     Weight --      Height --      Head Circumference --      Peak Flow --      Pain Score 08/19/20 0823 10     Pain Loc --      Pain Edu? --      Excl. in [place]? --    No data found.  Updated Vital Signs BP 118/74 (BP Location: Left Arm)   Pulse 90   Temp 98.2 F (36.8 C) (Oral)   Resp 18   [name] 07/29/2020 (Exact Date)   SpO2 100%   Visual Acuity Right Eye Distance:   Left Eye Distance:    Bilateral Distance:    Right Eye Near:   Left Eye Near:    Bilateral Near:     Physical Exam Constitutional:      Appearance: Normal appearance. She is normal weight.  Eyes:     Extraocular Movements: Extraocular movements intact.  Pulmonary:     Effort: Pulmonary effort is normal.  Musculoskeletal:        General: Normal range of motion.  Skin:    General: Skin is dry.     Comments: 2 x 4 abscess under right axilla, tender and swollen  Neurological:     Mental Status: She is alert and oriented to person, place, and time. Mental status is at baseline.  Psychiatric:        Mood and Affect: Mood normal.        Behavior: Behavior normal.     UC Treatments / Results  Labs (all labs ordered are listed, but only abnormal results are displayed) Labs Reviewed - No data to display  EKG   Radiology No results found.  Procedures Incision and Drainage  Date/Time: 08/19/2020 8:54 AM Performed by: Hans Eden, NP Authorized by: Hans Eden, NP   Consent:    Consent obtained:  Verbal   Consent given by:  Patient   Risks, benefits, and alternatives were discussed: yes     Risks discussed:  Incomplete drainage   Alternatives discussed:  Delayed treatment Universal protocol:    Procedure explained and questions answered to patient or proxy's satisfaction: yes     Patient identity confirmed:  Verbally with patient Location:    Type:  Abscess   Size:  2x4   Location: Right axilla. Pre-procedure details:    Skin preparation:  Chlorhexidine with alcohol Sedation:    Sedation type:  None Anesthesia:    Anesthesia method:  Local infiltration   Local anesthetic:  Lidocaine 1% WITH epi Procedure type:    Complexity:  Simple Procedure details:    Ultrasound guidance: no     Needle aspiration: no     Incision types:  Single straight   Drainage:  Purulent   Drainage amount:  Copious   Wound treatment:  Wound left open   Packing materials:  None Post-procedure  details:    Procedure completion:  Tolerated (including critical care time)  Medications Ordered in UC Medications - No data to display  Initial Impression / Assessment and Plan / UC Course  I have reviewed the triage vital signs and the nursing notes.  Pertinent labs & imaging results that were available during my care of the patient were reviewed by me and considered in my medical decision making (see chart for details).  Abscess of right axilla  1.  Cephalexin 1000 mg twice daily for 5 days 2.  Warm compresses at least 4 times a day to affected 3. Strict return precautions given for nonhealing wound  Final diagnoses:  Abscess of axilla, right     Discharge Instructions      Take 2 Keflex pills every morning and every evening for the next 5 days  Home warm compresses to area at least 4 times a day, the more the better this helps to facilitate drainage  If area continues to become bigger, becomes more tender, does not drain or does not heal please follow-up with urgent care or primary care doctor for reevaluation   ED Prescriptions     Medication Sig Dispense Auth. Provider   cephALEXin (KEFLEX) 500 MG capsule Take 2 capsules (1,000 mg total) by mouth 2 (two) times daily. 20 capsule Hans Eden, NP      PDMP not reviewed this encounter.   Hans Eden, [place] 08/19/20 [phone]

## 2020-08-19 NOTE — ED Triage Notes (Signed)
Pt presents with c/o abscess on right under arm X 4 days.   States she went to see her PCP and was given doxycycline. States she has not been able to tolerate the medication. C/o nausea and headache while on it.   Pt requests a Doctors note for work.

## 2020-08-19 NOTE — Telephone Encounter (Signed)
Spoke with patient on the phone regarding recent lab results. All questions answered.

## 2020-08-19 NOTE — Discharge Instructions (Addendum)
Take 2 Keflex pills every morning and every evening for the next 5 days  Home warm compresses to area at least 4 times a day, the more the better this helps to facilitate drainage  If area continues to become bigger, becomes more tender, does not drain or does not heal please follow-up with urgent care or primary care doctor for reevaluation

## 2020-09-06 NOTE — Telephone Encounter (Signed)
error 

## 2020-09-14 ENCOUNTER — Ambulatory Visit (HOSPITAL_COMMUNITY)
Admission: EM | Admit: 2020-09-14 | Discharge: 2020-09-14 | Disposition: A | Discharge status: Home / Self Care | Payer: 59 | Attending: Emergency Medicine | Admitting: Emergency Medicine

## 2020-09-14 ENCOUNTER — Encounter (HOSPITAL_COMMUNITY): Payer: Self-pay | Admitting: Emergency Medicine

## 2020-09-14 ENCOUNTER — Other Ambulatory Visit: Payer: Self-pay

## 2020-09-14 DIAGNOSIS — Z113 Encounter for screening for infections with a predominantly sexual mode of transmission: Secondary | ICD-10-CM | POA: Diagnosis not present

## 2020-09-14 DIAGNOSIS — N898 Other specified noninflammatory disorders of vagina: Secondary | ICD-10-CM | POA: Insufficient documentation

## 2020-09-14 NOTE — ED Provider Notes (Signed)
[facility]    CSN: [id number] Arrival date & time: 09/14/20  1001      History   Chief Complaint Chief Complaint  Patient presents with   Vaginal Discharge    HPI [name] is a 40 y.o. female.   Patient here for evaluation of vaginal discharge and lower abdominal pain that has been ongoing for the past 4 days.  Reports recently had yeast infection and wanted to be sure that it had resolved.  Denies any dysuria, urgency, or frequency.  Denies any trauma, injury, or other precipitating event.  Denies any specific alleviating or aggravating factors.  Denies any fevers, chest pain, shortness of breath, N/V/D, numbness, tingling, weakness, abdominal pain, or headaches.    The history is provided by the patient.  Vaginal Discharge Associated symptoms: no dysuria    Past Medical History:  Diagnosis Date   Endometriosis    History of 2019 novel coronavirus disease (COVID-19)    pt tested positive 03-03-2019, results in care everywhere.  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   Menorrhagia 12/25/2019   Vaginal discharge 07/08/2019   GERD (gastroesophageal reflux disease) 06/07/2019   HSV-2 seropositive 03/07/2019   Hydrosalpinx 12/30/2018   Endometriosis 05/01/2011   SEPTATE UTERUS 07/24/2009   TOBACCO ABUSE 09/26/2008   Female infertility 09/26/2008   IRREGULAR MENSTRUATION 09/21/2006    Past Surgical History:  Procedure Laterality Date   BREAST BIOPSY  Right 1999   per pt benign    LAPAROSCOPIC SALPINGOOPHERECTOMY Right 03-25-2010   '@[facility]'$    and LYSIS ADHESIONS   LAPAROSCOPY N/A 05/30/2019   Procedure: DIAGNOSTIC LAPAROSCOPY;  Surgeon: Woodroe Mode, MD;  Location: [facility]  Service: Gynecology;  Laterality: N/A;    OB History     Gravida  0   Para      Term      Preterm      AB      Living  0      SAB      IAB      Ectopic      Multiple      Live Births               Home Medications    Prior to Admission medications   Medication Sig Start Date End Date Taking? Authorizing Provider  albuterol (VENTOLIN HFA) 108 (90 Base) MCG/ACT inhaler Inhale 1-2 puffs into the lungs every 6 (six) hours as needed for wheezing or shortness of breath. Patient not taking: Reported on 07/31/2020 05/28/20   Hazel Sams, PA-C  benzonatate (TESSALON) 100 MG capsule Take 1 capsule (100 mg total) by mouth every 8 (eight) hours. Patient not taking: Reported on 07/31/2020 05/28/20   Hazel Sams, PA-C  cephALEXin (KEFLEX) 500 MG capsule Take 2 capsules (1,000 mg total) by mouth 2 (two) times daily. 08/19/20   Hans Eden, NP  clindamycin (CLINDAGEL) 1 % gel Apply topically 2 (two) times daily. Patient not taking: Reported on 07/31/2020 06/24/20   Mina Marble P, DO  ibuprofen (ADVIL) 600 MG tablet Take 1 tablet (600 mg total) by mouth every 6 (six) hours as needed. 07/31/20   Lavonia Drafts, MD  lidocaine-prilocaine (EMLA) cream Apply 1 application topically as needed (Painful boils). Patient not taking: Reported on 07/31/2020 06/24/20   Mina Marble P, DO  metroNIDAZOLE (FLAGYL) 500 MG tablet Take 1 tablet (500 mg total) by mouth 2 (two) times daily. Patient not taking: Reported on 07/31/2020 07/22/20   Chase Picket, MD  promethazine-dextromethorphan (PROMETHAZINE-DM) 6.25-15 MG/5ML syrup Take 5 mLs by mouth 4 (four) times daily as needed for cough. Patient not taking: Reported on 07/31/2020  05/28/20   Hazel Sams, PA-C  cetirizine (ZYRTEC ALLERGY) 10 MG tablet Take 1 tablet (10 mg total) by mouth daily. Patient not taking: Reported on 03/21/2020 10/02/19 05/28/20  Jaynee Eagles, PA-C  Norethindrone Acetate-Ethinyl Estrad-FE (LOESTRIN 24 FE) 1-20 MG-MCG(24) tablet Take 1 tablet by mouth daily. 03/21/20 05/28/20  Lavonia Drafts, MD  Norgestimate-Ethinyl Estradiol Triphasic (TRI-SPRINTEC) 0.18/0.215/0.25 MG-35 MCG tablet Take 1 tablet by mouth daily. 11/03/10 05/01/11  Woodroe Mode, MD    Family History Family History  Problem Relation Age of Onset   Hypertension Mother    Diabetes Mother    Cancer Father    Hypertension Father    Diabetes Brother    Hypertension Brother    Breast cancer Neg Hx     Social History Social History   Tobacco Use   Smoking status: Every Day    Packs/day: 0.25    Years: 15.00    Pack years: 3.75    Types: Cigarettes   Smokeless tobacco: Never  Vaping Use   Vaping Use: Never used  Substance Use Topics   Alcohol use: Yes    Comment: occasionally   Drug use: Never     Allergies   Metronidazole and Sulfa antibiotics   Review of Systems Review of Systems  Genitourinary:  Positive for pelvic pain and vaginal discharge. Negative for dysuria, frequency, hematuria, menstrual problem, vaginal bleeding and vaginal pain.  All other systems reviewed and are negative.   Physical Exam Triage Vital Signs ED Triage Vitals [09/14/20 1029]  Enc Vitals Group     BP 113/78     Pulse Rate 95     Resp 18     Temp 98.5 F (36.9 C)     Temp src      SpO2 98 %     Weight      Height      Head Circumference      Peak Flow      Pain Score      Pain Loc      Pain Edu?      Excl. in [place]?    No data found.  Updated Vital Signs BP 113/78   Pulse 95   Temp 98.5 F (36.9 C)   Resp 18   [name] 09/04/2020 (Exact Date)   SpO2 98%   Visual Acuity Right Eye Distance:   Left Eye Distance:   Bilateral Distance:    Right Eye Near:    Left Eye Near:    Bilateral Near:     Physical Exam Vitals and nursing note reviewed.  Constitutional:      General: She is not in acute distress.    Appearance: Normal appearance. She is not ill-appearing, toxic-appearing or  diaphoretic.  HENT:     Head: Normocephalic and atraumatic.  Eyes:     Conjunctiva/sclera: Conjunctivae normal.  Cardiovascular:     Rate and Rhythm: Normal rate.     Pulses: Normal pulses.  Pulmonary:     Effort: Pulmonary effort is normal.  Abdominal:     General: Abdomen is flat.  Genitourinary:    Comments: declines Musculoskeletal:        General: Normal range of motion.     Cervical back: Normal range of motion.  Skin:    General: Skin is warm and dry.  Neurological:     General: No focal deficit present.     Mental Status: She is alert and oriented to person, place, and time.  Psychiatric:        Mood and Affect: Mood normal.     UC Treatments / Results  Labs (all labs ordered are listed, but only abnormal results are displayed) Labs Reviewed  CERVICOVAGINAL ANCILLARY ONLY    EKG   Radiology No results found.  Procedures Procedures (including critical care time)  Medications Ordered in UC Medications - No data to display  Initial Impression / Assessment and Plan / UC Course  I have reviewed the triage vital signs and the nursing notes.  Pertinent labs & imaging results that were available during my care of the patient were reviewed by me and considered in my medical decision making (see chart for details).    Assessment negative for red flags or concerns.  Self swab obtained and will treated based on results.  Discussed safe sex practices including condom or other barrier method use.  Does report can take metronidazole if she is given something for nausea and vomiting.  Follow up with primary care as needed.  Final Clinical Impressions(s) / UC Diagnoses   Final diagnoses:  Vaginal discharge  Screen for STD (sexually  transmitted disease)     Discharge Instructions      We will contact you if the results from your lab work are positive and require additional treatment.    Do not have sex while taking undergoing treatment for STI.  Make sure that all of your partners get tested and treated.   Use a condom or other barrier method for all sexual encounters.    Return or go to the Emergency Department if symptoms worsen or do not improve in the next few days.      ED Prescriptions   None    PDMP not reviewed this encounter.   Pearson Forster, NP 09/14/20 1043

## 2020-09-14 NOTE — ED Triage Notes (Signed)
Pt presents today with c/o of vaginal discharge/pain x 4 days. Denies dysuria.

## 2020-09-14 NOTE — Discharge Instructions (Addendum)
We will contact you if the results from your lab work are positive and require additional treatment.    Do not have sex while taking undergoing treatment for STI.  Make sure that all of your partners get tested and treated.   Use a condom or other barrier method for all sexual encounters.    Return or go to the Emergency Department if symptoms worsen or do not improve in the next few days.

## 2020-09-16 LAB — CERVICOVAGINAL ANCILLARY ONLY
Bacterial Vaginitis (gardnerella): NEGATIVE
Candida Glabrata: NEGATIVE
Candida Vaginitis: NEGATIVE
Chlamydia: NEGATIVE
Comment: NEGATIVE
Comment: NEGATIVE
Comment: NEGATIVE
Comment: NEGATIVE
Comment: NEGATIVE
Comment: NORMAL
Neisseria Gonorrhea: NEGATIVE
Trichomonas: NEGATIVE

## 2020-09-17 ENCOUNTER — Ambulatory Visit: Payer: 59

## 2020-09-17 NOTE — Progress Notes (Deleted)
    SUBJECTIVE:   CHIEF COMPLAINT / HPI:   Vaginal Discharge: Patient is a 40 y.o. female presenting with vaginal discharge for *** days.  She states the discharge is of *** consistency.  She endorses *** vaginal odor.  She is interested in screening for sexually transmitted infections today.  PERTINENT  PMH / PSH: ***None relevant  OBJECTIVE:   [name] 09/04/2020 (Exact Date)    General: NAD, pleasant, able to participate in exam Respiratory: Normal effort, no obvious respiratory distress Pelvic: VULVA: normal appearing vulva with no masses, tenderness or lesions, VAGINA: Normal appearing vagina with normal color, no lesions, with {GYN VAGINAL DISCHARGE:21986} discharge present, ***CERVIX: No lesions, {GYN VAGINAL DISCHARGE:21986} discharge present,  Chaperone *** present for pelvic exam  ASSESSMENT/PLAN:   No problem-specific Assessment & Plan notes found for this encounter.    Assessment:  40 y.o. female with vaginal discharge for***days, as well as***.  Physical exam significant for*** discharge.  Wet prep performed today shows *** consistent with ***.  Patient is interested in STI screening.   Plan: -Wet prep as above.  Will treat with***. -GC/chlamydia pending -Will check HIV and RPR  Lurline Del, [place]

## 2020-09-24 ENCOUNTER — Other Ambulatory Visit: Payer: Self-pay

## 2020-09-24 ENCOUNTER — Ambulatory Visit (INDEPENDENT_AMBULATORY_CARE_PROVIDER_SITE_OTHER): Payer: 59 | Admitting: Family Medicine

## 2020-09-24 VITALS — BP 102/68 | HR 98

## 2020-09-24 DIAGNOSIS — R350 Frequency of micturition: Secondary | ICD-10-CM | POA: Diagnosis not present

## 2020-09-24 DIAGNOSIS — Z1152 Encounter for screening for COVID-19: Secondary | ICD-10-CM | POA: Diagnosis not present

## 2020-09-24 DIAGNOSIS — U071 COVID-19: Secondary | ICD-10-CM | POA: Diagnosis not present

## 2020-09-24 HISTORY — DX: COVID-19: U07.1

## 2020-09-24 LAB — POCT URINALYSIS DIP (MANUAL ENTRY)
Bilirubin, UA: NEGATIVE
Blood, UA: NEGATIVE
Glucose, UA: NEGATIVE mg/dL
Ketones, POC UA: NEGATIVE mg/dL
Leukocytes, UA: NEGATIVE
Nitrite, UA: NEGATIVE
Protein Ur, POC: NEGATIVE mg/dL
Spec Grav, UA: >=1.03 — AB (ref 1.010–1.025)
Urobilinogen, UA: 0.2 E.U./dL
pH, UA: 5.5 (ref 5.0–8.0)

## 2020-09-24 NOTE — Patient Instructions (Addendum)
It was great to see you!  -Today we checked your urine for signs of a UTI (urinary tract infection). I will call you with the results -We also did a COVID swab. As discussed, if this comes back positive, it will not change our management. People often test positive for up to several weeks and even a month after the initial infection. You are able to resume work now that your symptoms have resolved and your quarantine has ended (5 days).  -We are not able to do STD testing today because this is a designated visit for your COVID. Please schedule a separate appointment if you're still concerned and would like to be tested.  Take care and seek immediate care sooner if you develop any concerns.  Dr. [facility]

## 2020-09-24 NOTE — Progress Notes (Signed)
    SUBJECTIVE:   CHIEF COMPLAINT / HPI:   COVID follow up Patient reports sore throat and "cold symptoms" that started on 8/7. Possible fever although unsure because her thermometer is broken. She tested positive for COVID on 8/11 with an at-home test. Reports she is feeling better and her symptoms have resolved. She took another at home test this morning and it was negative. However, patient hesitant to go back to work because she works in [employer]. Doesn't trust the accuracy of the at home test so would like confirmatory negative test here.  Urinary frequency She endorses urinary frequency that has been getting worse over the past one week. Associated mild abdominal pressure with urinating but no dysuria. Requesting testing for UTI as well as STDs. Of note, she had negative STD testing and wet prep on 8/6. No new sexual partners since that time.  PERTINENT  PMH / PSH: none  OBJECTIVE:   BP 102/68   Pulse 98   [name] 09/04/2020 (Exact Date)   SpO2 99%   General: NAD, pleasant, able to participate in exam Respiratory: No respiratory distress Skin: warm and dry, no rashes noted Psych: Normal affect and mood Neuro: grossly intact   ASSESSMENT/PLAN:   Recent COVID infection Patient tested positive on 8/11. Symptoms were mild and have now resolved. Technically she has completed the 5 day quarantine window and had a negative at-home test this morning. Explained to patient that repeat test in the office would not change management (if positive, ok to return to work. If negative, ok to return to work). She still requested testing today so swab was sent.   Urinary Frequency Acute x1 week. No dysuria or vaginal symptoms. UA negative. Doubt STD as she had negative testing 10 days ago. Reassurance provided. Patient to schedule separate appointment for repeat STD testing if desired (not able to perform in [facility]).   Alcus Dad, MD [place]

## 2020-09-26 LAB — NOVEL CORONAVIRUS, NAA: SARS-CoV-2, NAA: DETECTED — AB

## 2020-09-26 LAB — SARS-COV-2, NAA 2 DAY TAT

## 2020-10-15 NOTE — Progress Notes (Signed)
     SUBJECTIVE:   CHIEF COMPLAINT / HPI:   [name] is a 40 y.o. female presents for vaginal discharge  Vaginal Discharge Patient reports that vaginal discharge started 5 days ago.  She notes that discharge appears white and thick.  She reports mild vaginal odors.  She denies vaginal pruritis, abnormal vaginal bleeding, dysuria, hematuria, frequency, abdominal pain pelvic pain, nausea, vomiting, fevers or new rash.   No history of STIs.  She is sexually active with one female partner and sometimes use condoms. No birth control. [name] 26th August.  She does not douche. No change in sexual partner since.  [place] Office Visit from 08/16/2020 in [place]  PHQ-9 Total Score 3         PERTINENT  PMH / PSH: endometriosis, hydradenitis   OBJECTIVE:   BP 109/76   Pulse 97   Ht '5\' 2"'$  (1.575 m)   Wt 169 lb 8 oz (76.9 kg)   [name] 10/04/2020   SpO2 100%   BMI 31.00 kg/m    General: Alert, no acute distress Cardio: well perfused  Pulm:  normal work of breathing Neuro: Cranial nerves grossly intact   Pelvic Exam chaperoned by CMA Tashira         External: normal female genitalia without lesions or masses        Vagina: normal without lesions or masses        Cervix: normal without lesions or masses        Samples for Wet prep, GC/Chlamydia obtained   ASSESSMENT/PLAN:   Vaginal discharge Wet prep and STD swabs obtained today as well as RPR, Hep C and HIV labs.    Lattie Haw, MD PGY-3 [facility]

## 2020-10-16 ENCOUNTER — Other Ambulatory Visit: Payer: Self-pay

## 2020-10-16 ENCOUNTER — Other Ambulatory Visit (HOSPITAL_COMMUNITY)
Admission: RE | Admit: 2020-10-16 | Discharge: 2020-10-16 | Disposition: A | Discharge status: Home / Self Care | Payer: 59 | Source: Ambulatory Visit | Attending: Family Medicine | Admitting: Family Medicine

## 2020-10-16 ENCOUNTER — Ambulatory Visit (INDEPENDENT_AMBULATORY_CARE_PROVIDER_SITE_OTHER): Payer: 59 | Admitting: Family Medicine

## 2020-10-16 ENCOUNTER — Encounter: Payer: Self-pay | Admitting: Family Medicine

## 2020-10-16 VITALS — BP 109/76 | HR 97 | Ht 62.0 in | Wt 169.5 lb

## 2020-10-16 DIAGNOSIS — Z113 Encounter for screening for infections with a predominantly sexual mode of transmission: Secondary | ICD-10-CM

## 2020-10-16 DIAGNOSIS — N898 Other specified noninflammatory disorders of vagina: Secondary | ICD-10-CM | POA: Diagnosis not present

## 2020-10-16 LAB — POCT WET PREP (WET MOUNT)
Clue Cells Wet Prep Whiff POC: NEGATIVE
Trichomonas Wet Prep HPF POC: ABSENT

## 2020-10-16 NOTE — Patient Instructions (Signed)
Thank you for coming to see me today. It was a pleasure.   We performed STD testing today. This will take a few days to come back. If your MyChart is activated, we will message you on there if everything is normal, otherwise we will call. If we need to treat something we will also call you. If you do not hear from [place] in the next 4 days, please give [place] a call.   If you have any questions or concerns, please do not hesitate to call the office at [phone].  Best wishes,   Dr Posey Pronto

## 2020-10-17 LAB — HEPATITIS C ANTIBODY: Hep C Virus Ab: <0.1 s/co ratio (ref 0.0–0.9)

## 2020-10-17 LAB — CERVICOVAGINAL ANCILLARY ONLY
Chlamydia: NEGATIVE
Comment: NEGATIVE
Comment: NORMAL
Neisseria Gonorrhea: NEGATIVE

## 2020-10-17 LAB — HIV ANTIBODY (ROUTINE TESTING W REFLEX): HIV Screen 4th Generation wRfx: NONREACTIVE

## 2020-10-17 LAB — RPR: RPR Ser Ql: NONREACTIVE

## 2020-10-19 NOTE — Assessment & Plan Note (Signed)
Wet prep and STD swabs obtained today as well as RPR, Hep C and HIV labs.

## 2020-10-24 ENCOUNTER — Other Ambulatory Visit: Payer: Self-pay

## 2020-10-25 ENCOUNTER — Encounter (HOSPITAL_COMMUNITY): Payer: Self-pay

## 2020-10-25 ENCOUNTER — Ambulatory Visit (HOSPITAL_COMMUNITY)
Admission: RE | Admit: 2020-10-25 | Discharge: 2020-10-25 | Disposition: A | Discharge status: Home / Self Care | Payer: 59 | Source: Ambulatory Visit | Attending: Emergency Medicine | Admitting: Emergency Medicine

## 2020-10-25 ENCOUNTER — Other Ambulatory Visit: Payer: Self-pay

## 2020-10-25 VITALS — BP 107/64 | HR 75 | Temp 98.4°F | Resp 16

## 2020-10-25 DIAGNOSIS — N760 Acute vaginitis: Secondary | ICD-10-CM | POA: Diagnosis present

## 2020-10-25 MED ORDER — FLUCONAZOLE 150 MG PO TABS: 150.0000 mg | ORAL_TABLET | Freq: Once | ORAL | 1 refills | Status: AC

## 2020-10-25 MED ORDER — METRONIDAZOLE 0.75 % VA GEL: VAGINAL | 0 refills | Status: DC

## 2020-10-25 NOTE — Discharge Instructions (Addendum)
Take the medication as written.  I suspect that this is yeast or BV.  Diflucan for yeast infection, metronidazole gel for BV.  Give [place] a working phone number so that we can contact you if needed. Refrain from sexual contact until you know your results and your partner(s) are treated if necessary. Return to the ER if you get worse, have a fever >100.4, or for any concerns.   Go to www.goodrx.com  or www.costplusdrugs.com to look up your medications. This will give you a list of where you can find your prescriptions at the most affordable prices. Or ask the pharmacist what the cash price is, or if they have any other discount programs available to help make your medication more affordable. This can be less expensive than what you would pay with insurance.

## 2020-10-25 NOTE — ED Triage Notes (Signed)
Pt c/o white vaginal discharge and itching for 4-5 days. Denies odor or pain.

## 2020-10-25 NOTE — ED Provider Notes (Signed)
HPI  SUBJECTIVE:  [name] is a 40 y.o. female who presents with 5 days of thick white, nonodorous vaginal discharge and itching.  She reports low midline seconds long, crampy pain, states that her menses is about to start and that this is consistent with previous menses.  She resolves with Advil.  she reports cloudy urine. No dysuria, urgency, frequency, oderous urine, hematuria,  genital blisters. No aggravating, alleviating factors. Has not tried anything for this. No fevers, N/V, other abd pain,  back pain.  No recent abx use.  Pt is in a longterm monogamous sexual relationship with a female partner who is asxatic.  Would like to be checked for STDs.    Past Medical History:  Diagnosis Date   Endometriosis    History of 2019 novel coronavirus disease (COVID-19)    pt tested positive 03-03-2019, results in care everywhere.  (05-22-2019 per pt had symptoms of fatigue, headache, and loss of taste/ smell;  fatigue and headache after one week and loss taste/ smell resolved after 3 wks)   Infertility, female    Ovarian cyst    Pelvic mass in female    large fluid filled pelvic mass   Recurrent boils    Septate uterus    Trichomoniasis     Past Surgical History:  Procedure Laterality Date   BREAST BIOPSY Right 1999   per pt benign    LAPAROSCOPIC SALPINGOOPHERECTOMY Right 03-25-2010   '@[facility]'$    and LYSIS ADHESIONS   LAPAROSCOPY N/A 05/30/2019   Procedure: DIAGNOSTIC LAPAROSCOPY;  Surgeon: Woodroe Mode, MD;  Location: [facility];  Service: Gynecology;  Laterality: N/A;    Family History  Problem Relation Age of Onset   Hypertension Mother    Diabetes Mother    Cancer Father    Hypertension Father    Diabetes Brother    Hypertension Brother    Breast cancer Neg Hx     Social History   Tobacco Use   Smoking status: Every Day    Packs/day: 0.25    Years: 15.00    Pack years: 3.75    Types: Cigarettes   Smokeless tobacco: Never  Vaping Use   Vaping Use:  Never used  Substance Use Topics   Alcohol use: Yes    Comment: occasionally   Drug use: Never    No current facility-administered medications for this encounter.  Current Outpatient Medications:    fluconazole (DIFLUCAN) 150 MG tablet, Take 1 tablet (150 mg total) by mouth once for 1 dose. 1 tab po x 1. May repeat in 72 hours if no improvement, Disp: 2 tablet, Rfl: 1   ibuprofen (ADVIL) 600 MG tablet, Take 1 tablet (600 mg total) by mouth every 6 (six) hours as needed., Disp: 60 tablet, Rfl: 3   metroNIDAZOLE (METROGEL) 0.75 % vaginal gel, 1 applicator full pv qhs x 5 days, Disp: 70 g, Rfl: 0  Allergies  Allergen Reactions   Metronidazole Nausea And Vomiting    Patient states she is unable to take the pills Patient refuses to take and has BV regularly, only wants gel   Sulfa Antibiotics Other (See Comments)    Upset stomache     ROS  As noted in HPI.   Physical Exam  BP 107/64 (BP Location: Left Arm)   Pulse 75   Temp 98.4 F (36.9 C) (Oral)   Resp 16   [name] 10/04/2020   SpO2 100%   Constitutional: Well developed, well nourished, no acute distress  Eyes:  EOMI, conjunctiva normal bilaterally HENT: Normocephalic, atraumatic,mucus membranes moist Respiratory: Normal inspiratory effort Cardiovascular: Normal rate GI: nondistended soft, nontender. No suprapubic, flank tenderness  back: No CVA tenderness GU: Deferred  skin: No rash, skin intact Musculoskeletal: no deformities Neurologic: Alert & oriented x 3, no focal neuro deficits Psychiatric: Speech and behavior appropriate   ED Course   Medications - No data to display  No orders of the defined types were placed in this encounter.   No results found for this or any previous visit (from the past 24 hour(s)). No results found.  ED Clinical Impression  1. Acute vaginitis    ED Assessment/Plan  H&P most c/w yeast infection versus BV.  Sent off swab for GC chlamydia trichomonas, BV and yeast.  Patient has  no urinary complaints other than some cloudy urine, I suspect this is coming from a discharge.  She denies possibility of being pregnant.  Do not think that we need to check a urine for UTI or pregnancy.  Will wait for lab results prior to treating for STDs.  In the meantime, home with Diflucan and wait-and-see prescription of metronidazole cream.  Pt provided working phone number. Follow-up with PMD as needed. Discussed labs, MDM, plan with patient. Pt agrees with plan.   Meds ordered this encounter  Medications   metroNIDAZOLE (METROGEL) 0.75 % vaginal gel    Sig: 1 applicator full pv qhs x 5 days    Dispense:  70 g    Refill:  0   fluconazole (DIFLUCAN) 150 MG tablet    Sig: Take 1 tablet (150 mg total) by mouth once for 1 dose. 1 tab po x 1. May repeat in 72 hours if no improvement    Dispense:  2 tablet    Refill:  1    *This clinic note was created using [employer]. Therefore, there may be occasional mistakes despite careful proofreading.  ?     Melynda Ripple, MD 10/26/20 [phone]

## 2020-10-28 LAB — CERVICOVAGINAL ANCILLARY ONLY
Bacterial Vaginitis (gardnerella): POSITIVE — AB
Candida Glabrata: NEGATIVE
Candida Vaginitis: NEGATIVE
Chlamydia: NEGATIVE
Comment: NEGATIVE
Comment: NEGATIVE
Comment: NEGATIVE
Comment: NEGATIVE
Comment: NEGATIVE
Comment: NORMAL
Neisseria Gonorrhea: NEGATIVE
Trichomonas: NEGATIVE

## 2020-11-06 ENCOUNTER — Ambulatory Visit (HOSPITAL_COMMUNITY)
Admission: EM | Admit: 2020-11-06 | Discharge: 2020-11-06 | Disposition: A | Discharge status: Home / Self Care | Payer: 59 | Attending: Emergency Medicine | Admitting: Emergency Medicine

## 2020-11-06 ENCOUNTER — Other Ambulatory Visit: Payer: Self-pay

## 2020-11-06 ENCOUNTER — Encounter (HOSPITAL_COMMUNITY): Payer: Self-pay

## 2020-11-06 DIAGNOSIS — J029 Acute pharyngitis, unspecified: Secondary | ICD-10-CM | POA: Insufficient documentation

## 2020-11-06 DIAGNOSIS — B9689 Other specified bacterial agents as the cause of diseases classified elsewhere: Secondary | ICD-10-CM | POA: Insufficient documentation

## 2020-11-06 DIAGNOSIS — N898 Other specified noninflammatory disorders of vagina: Secondary | ICD-10-CM | POA: Diagnosis not present

## 2020-11-06 DIAGNOSIS — N760 Acute vaginitis: Secondary | ICD-10-CM | POA: Insufficient documentation

## 2020-11-06 MED ORDER — PREDNISONE 10 MG (21) PO TBPK: ORAL_TABLET | Freq: Every day | ORAL | 0 refills | Status: DC

## 2020-11-06 MED ORDER — CLINDAMYCIN HCL 300 MG PO CAPS: 300.0000 mg | ORAL_CAPSULE | Freq: Three times a day (TID) | ORAL | 0 refills | Status: DC

## 2020-11-06 NOTE — Discharge Instructions (Addendum)
Will send off test look for results on my chart in 3 days  Warm salt gargles for sore throat  Take motrin as needed for pain

## 2020-11-06 NOTE — ED Triage Notes (Signed)
Pt c/o sore throat x3 days. Pt c/o vaginal discharge x4 days. Pt requesting oral and vaginal self swabbing.

## 2020-11-06 NOTE — ED Provider Notes (Signed)
[place]    CSN: [id number] Arrival date & time: 11/06/20  0801      History   Chief Complaint Chief Complaint  Patient presents with   Sore Throat   SEXUALLY TRANSMITTED DISEASE    HPI [name] is a 40 y.o. female.   Pt was seen 10/25/2020 treated for BV states that she is still having sx white vaginal discharge. No abd pain, no fever. She also has a sore throat and would like to be re tested for std and have her throat swabbed also. Denies any cough, congestion, no sob. Has not taken anything pta for throat. Took covid test at home and it was negative.    Past Medical History:  Diagnosis Date   Endometriosis    History of 2019 novel coronavirus disease (COVID-19)    pt tested positive 03-03-2019, results in care everywhere.  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   Menorrhagia 12/25/2019   Vaginal discharge 07/08/2019   GERD (gastroesophageal reflux disease) 06/07/2019   HSV-2 seropositive 03/07/2019   Hydrosalpinx 12/30/2018   Endometriosis 05/01/2011   SEPTATE UTERUS 07/24/2009   TOBACCO ABUSE 09/26/2008   Female infertility 09/26/2008   IRREGULAR MENSTRUATION 09/21/2006    Past Surgical History:  Procedure Laterality Date   BREAST BIOPSY Right 1999   per pt benign    LAPAROSCOPIC SALPINGOOPHERECTOMY Right 03-25-2010   @[facility]    and LYSIS ADHESIONS   LAPAROSCOPY N/A 05/30/2019   Procedure: DIAGNOSTIC LAPAROSCOPY;  Surgeon: Woodroe Mode,  MD;  Location: [facility];  Service: Gynecology;  Laterality: N/A;    OB History     Gravida  0   Para      Term      Preterm      AB      Living  0      SAB      IAB      Ectopic      Multiple      Live Births               Home Medications    Prior to Admission medications   Medication Sig Start Date End Date Taking? Authorizing Provider  clindamycin (CLEOCIN) 300 MG capsule Take 1 capsule (300 mg total) by mouth 3 (three) times daily. 11/06/20  Yes Marney Setting, NP  predniSONE (STERAPRED UNI-PAK 21 TAB) 10 MG (21) TBPK tablet Take by mouth daily. Take 6 tabs by mouth daily  for 2 days, then 5 tabs for 2 days, then 4 tabs for 2 days, then 3 tabs for 2 days, 2 tabs for 2 days, then 1 tab by mouth daily for 2 days 11/06/20  Yes Marney Setting, NP  cetirizine (ZYRTEC ALLERGY) 10 MG tablet Take 1 tablet (10 mg total) by mouth daily. Patient not taking: Reported on 03/21/2020 10/02/19 05/28/20  Jaynee Eagles, PA-C  Norethindrone  Acetate-Ethinyl Estrad-FE (LOESTRIN 24 FE) 1-20 MG-MCG(24) tablet Take 1 tablet by mouth daily. 03/21/20 05/28/20  Lavonia Drafts, MD  Norgestimate-Ethinyl Estradiol Triphasic (TRI-SPRINTEC) 0.18/0.215/0.25 MG-35 MCG tablet Take 1 tablet by mouth daily. 11/03/10 05/01/11  Woodroe Mode, MD    Family History Family History  Problem Relation Age of Onset   Hypertension Mother    Diabetes Mother    Cancer Father    Hypertension Father    Diabetes Brother    Hypertension Brother    Breast cancer Neg Hx     Social History Social History   Tobacco Use   Smoking status: Every Day    Packs/day: 0.25    Years: 15.00    Pack years: 3.75    Types: Cigarettes   Smokeless tobacco: Never  Vaping Use   Vaping Use: Never used  Substance Use Topics   Alcohol use: Yes    Comment: occasionally   Drug use: Never     Allergies   Metronidazole and Sulfa antibiotics   Review of Systems Review of Systems   Constitutional:  Negative for chills, fatigue and fever.  HENT:  Positive for sore throat. Negative for mouth sores, postnasal drip, rhinorrhea, sinus pressure and sinus pain.   Eyes: Negative.   Respiratory: Negative.    Cardiovascular: Negative.   Gastrointestinal: Negative.   Genitourinary:  Positive for vaginal discharge. Negative for decreased urine volume, hematuria, pelvic pain, urgency and vaginal bleeding.  Neurological: Negative.     Physical Exam Triage Vital Signs ED Triage Vitals [11/06/20 0826]  Enc Vitals Group     BP 107/71     Pulse Rate 76     Resp 18     Temp 98.1 F (36.7 C)     Temp Source Oral     SpO2 98 %     Weight      Height      Head Circumference      Peak Flow      Pain Score 4     Pain Loc      Pain Edu?      Excl. in [place]?    No data found.  Updated Vital Signs BP 107/71 (BP Location: Left Arm)   Pulse 76   Temp 98.1 F (36.7 C) (Oral)   Resp 18   [name] 11/02/2020   SpO2 98%   Visual Acuity Right Eye Distance:   Left Eye Distance:   Bilateral Distance:    Right Eye Near:   Left Eye Near:    Bilateral Near:     Physical Exam Constitutional:      Appearance: She is well-developed.  HENT:     Right Ear: Tympanic membrane normal. No drainage.     Left Ear: Tympanic membrane normal.     Nose: No congestion or rhinorrhea.     Mouth/Throat:     Mouth: Mucous membranes are moist. No oral lesions.     Pharynx: Pharyngeal swelling and posterior oropharyngeal erythema present. No oropharyngeal exudate or uvula swelling.     Tonsils: 1+ on the right. 1+ on the left.  Eyes:     Conjunctiva/sclera: Conjunctivae normal.  Cardiovascular:     Rate and Rhythm: Normal rate.     Heart sounds: Normal heart sounds.  Pulmonary:     Effort: Pulmonary effort is normal.  Abdominal:     Palpations: Abdomen is soft.  Musculoskeletal:     Cervical back: Normal range of motion.  Skin:    General:  Skin is warm.  Neurological:     Mental  Status: She is alert.     UC Treatments / Results  Labs (all labs ordered are listed, but only abnormal results are displayed) Labs Reviewed - No data to display  EKG   Radiology No results found.  Procedures Procedures (including critical care time)  Medications Ordered in UC Medications - No data to display  Initial Impression / Assessment and Plan / UC Course  I have reviewed the triage vital signs and the nursing notes.  Pertinent labs & imaging results that were available during my care of the patient were reviewed by me and considered in my medical decision making (see chart for details).     Will send off test look for results on my chart in 3 days  Warm salt gargles for sore throat  Take motrin as needed for pain  Pt states that she can not take the flagyl po it makes her sick , she can cont to take the vaginal cream .   Final Clinical Impressions(s) / UC Diagnoses   Final diagnoses:  Sore throat  Vaginal discharge  BV (bacterial vaginosis)     Discharge Instructions      Will send off test look for results on my chart in 3 days  Warm salt gargles for sore throat  Take motrin as needed for pain        ED Prescriptions     Medication Sig Dispense Auth. Provider   predniSONE (STERAPRED UNI-PAK 21 TAB) 10 MG (21) TBPK tablet Take by mouth daily. Take 6 tabs by mouth daily  for 2 days, then 5 tabs for 2 days, then 4 tabs for 2 days, then 3 tabs for 2 days, 2 tabs for 2 days, then 1 tab by mouth daily for 2 days 42 tablet Morley Kos L, NP   clindamycin (CLEOCIN) 300 MG capsule Take 1 capsule (300 mg total) by mouth 3 (three) times daily. 21 capsule Marney Setting, NP      PDMP not reviewed this encounter.   Marney Setting, NP 11/06/20 [phone]

## 2020-11-07 ENCOUNTER — Telehealth (HOSPITAL_COMMUNITY): Payer: Self-pay | Admitting: Emergency Medicine

## 2020-11-07 LAB — CYTOLOGY, (ORAL, ANAL, URETHRAL) ANCILLARY ONLY
Chlamydia: NEGATIVE
Comment: NEGATIVE
Comment: NEGATIVE
Comment: NORMAL
Neisseria Gonorrhea: NEGATIVE
Trichomonas: NEGATIVE

## 2020-11-07 LAB — CERVICOVAGINAL ANCILLARY ONLY
Bacterial Vaginitis (gardnerella): NEGATIVE
Candida Glabrata: NEGATIVE
Candida Vaginitis: POSITIVE — AB
Chlamydia: NEGATIVE
Comment: NEGATIVE
Comment: NEGATIVE
Comment: NEGATIVE
Comment: NEGATIVE
Comment: NEGATIVE
Comment: NORMAL
Neisseria Gonorrhea: NEGATIVE
Trichomonas: NEGATIVE

## 2020-11-07 MED ORDER — FLUCONAZOLE 150 MG PO TABS: 150.0000 mg | ORAL_TABLET | Freq: Once | ORAL | 0 refills | Status: AC

## 2020-11-15 ENCOUNTER — Other Ambulatory Visit (HOSPITAL_COMMUNITY)
Admission: RE | Admit: 2020-11-15 | Discharge: 2020-11-15 | Disposition: A | Discharge status: Home / Self Care | Payer: 59 | Source: Ambulatory Visit | Attending: Family Medicine | Admitting: Family Medicine

## 2020-11-15 ENCOUNTER — Ambulatory Visit (INDEPENDENT_AMBULATORY_CARE_PROVIDER_SITE_OTHER): Payer: 59 | Admitting: Family Medicine

## 2020-11-15 ENCOUNTER — Other Ambulatory Visit: Payer: Self-pay

## 2020-11-15 VITALS — BP 112/86 | HR 99 | Ht 62.0 in | Wt 164.6 lb

## 2020-11-15 DIAGNOSIS — N898 Other specified noninflammatory disorders of vagina: Secondary | ICD-10-CM | POA: Insufficient documentation

## 2020-11-15 DIAGNOSIS — R350 Frequency of micturition: Secondary | ICD-10-CM

## 2020-11-15 LAB — POCT URINALYSIS DIP (MANUAL ENTRY)
Blood, UA: NEGATIVE
Glucose, UA: NEGATIVE mg/dL
Leukocytes, UA: NEGATIVE
Nitrite, UA: NEGATIVE
Protein Ur, POC: NEGATIVE mg/dL
Spec Grav, UA: >=1.03 — AB (ref 1.010–1.025)
Urobilinogen, UA: 0.2 E.U./dL
pH, UA: 5.5 (ref 5.0–8.0)

## 2020-11-15 LAB — POCT WET PREP (WET MOUNT)
Clue Cells Wet Prep Whiff POC: NEGATIVE
Trichomonas Wet Prep HPF POC: ABSENT

## 2020-11-15 MED ORDER — FLUCONAZOLE 150 MG PO TABS: 150.0000 mg | ORAL_TABLET | Freq: Once | ORAL | 0 refills | Status: AC

## 2020-11-15 NOTE — Patient Instructions (Signed)
We discussed vaginal discharge and burning with urination today.  We performed a urinalysis which showed no obvious sign of urinary tract infection.  We also performed a wet prep which showed no obvious sign of a yeast infection, trichomonas, or BV, however with your level of discharge and want to go ahead and treat preemptively for yeast infection.  I have sent in Diflucan x2.  I would like for you to take 1 dose today, take another dose in 2 to 3 days if you are still having symptoms.  If you are still having symptoms after this we should see you back.Marland Kitchen

## 2020-11-15 NOTE — Progress Notes (Signed)
    SUBJECTIVE:   CHIEF COMPLAINT / HPI:   Vaginal Discharge: Patient is a 40 y.o. female presenting with vaginal discharge for several days.  She was seen in urgent care on 11/06/2020 and had testing for STDs and wet prep which only showed positive Candida.  She continues to have symptoms and request retesting.  She states that she did take the Diflucan but did not notice any improvement with her symptoms.  She states the discharge is of thick whitish consistency.  She endorses some vaginal odor.  She is interested in screening for sexually transmitted infections today.  She also states she has had some dysuria and would like to have her urine checked.  PERTINENT  PMH / PSH: None relevant  OBJECTIVE:   BP 112/86   Pulse 99   Ht 5\' 2"  (1.575 m)   Wt 164 lb 9.6 oz (74.7 kg)   [name] 11/02/2020   SpO2 100%   BMI 30.11 kg/m    General: NAD, pleasant, able to participate in exam Respiratory: Normal effort, no obvious respiratory distress Pelvic: VULVA: normal appearing vulva with no masses, tenderness or lesions, VAGINA: Normal appearing vagina with normal color, no lesions, with copious and white discharge present. Chaperone April present for pelvic exam  ASSESSMENT/PLAN:   Vaginal discharge:  40 y.o. female with vaginal discharge for several days, as well as itching and vaginal odor.  Physical exam significant for copious thick cottage cheeselike discharge suggestive of Candida infection.  She was previously tested positive for Candida infection on 9/28 and received Diflucan x1 which did not improve her symptoms.  She also has complaints of urinary frequency and so we will check a urinalysis.  Urinalysis shows cloudy urine with no leuks, no nitrite, unlikely urinary tract infection. Wet prep performed today shows few bacteria with negative whiff and negative KOH, however with patient's symptoms and significant "cottage cheeselike" discharge we will still plan to treat for yeast infection with  Diflucan.  We will give 2 doses, 1 to take today and 1 to take 3 days later if she still having symptoms.  Patient is interested in STI screening.   Plan: -Wet prep as above.  Will treat with Diflucan x2. -GC/chlamydia pending -Will check HIV and RPR  Lurline Del, [place]

## 2020-11-16 LAB — HIV ANTIBODY (ROUTINE TESTING W REFLEX): HIV Screen 4th Generation wRfx: NONREACTIVE

## 2020-11-16 LAB — RPR: RPR Ser Ql: NONREACTIVE

## 2020-11-18 ENCOUNTER — Other Ambulatory Visit: Payer: Self-pay | Admitting: Obstetrics & Gynecology

## 2020-11-18 DIAGNOSIS — N7011 Chronic salpingitis: Secondary | ICD-10-CM

## 2020-11-18 LAB — CERVICOVAGINAL ANCILLARY ONLY
Chlamydia: NEGATIVE
Comment: NEGATIVE
Comment: NORMAL
Neisseria Gonorrhea: NEGATIVE

## 2020-11-19 ENCOUNTER — Telehealth: Payer: Self-pay | Admitting: General Practice

## 2020-11-19 NOTE — Telephone Encounter (Signed)
Left message on VM informing patient of [place] appt at [facility] as requested on Thursday, 11/21/2020 at 3:30pm arriving at 3:15pm with a full bladder.  Asked patient to contact our office with any questions or concerns.

## 2020-11-21 ENCOUNTER — Other Ambulatory Visit: Payer: Self-pay

## 2020-11-21 ENCOUNTER — Ambulatory Visit (HOSPITAL_COMMUNITY)
Admission: RE | Admit: 2020-11-21 | Discharge: 2020-11-21 | Disposition: A | Discharge status: Home / Self Care | Payer: 59 | Source: Ambulatory Visit | Attending: Obstetrics & Gynecology | Admitting: Obstetrics & Gynecology

## 2020-11-21 ENCOUNTER — Ambulatory Visit (HOSPITAL_BASED_OUTPATIENT_CLINIC_OR_DEPARTMENT_OTHER): Payer: 59

## 2020-11-21 DIAGNOSIS — N7011 Chronic salpingitis: Secondary | ICD-10-CM | POA: Insufficient documentation

## 2020-11-25 ENCOUNTER — Telehealth: Payer: Self-pay

## 2020-11-25 NOTE — Telephone Encounter (Signed)
Patient calls nurse line stating she is returning phone call to provider. Patient states that there was no voice message. Advised patient that I was unable to find who was trying to reach her. Also advised that OB may have been reaching out regarding [place] results.   Forwarding to PCP and provider who saw patient in office on 10/7.  Please advise if you tried to reach patient.  Talbot Grumbling, RN

## 2020-12-11 ENCOUNTER — Other Ambulatory Visit: Payer: Self-pay

## 2020-12-11 ENCOUNTER — Ambulatory Visit (HOSPITAL_COMMUNITY)
Admission: EM | Admit: 2020-12-11 | Discharge: 2020-12-11 | Disposition: A | Discharge status: Home / Self Care | Payer: 59 | Attending: Internal Medicine | Admitting: Internal Medicine

## 2020-12-11 ENCOUNTER — Encounter (HOSPITAL_COMMUNITY): Payer: Self-pay | Admitting: Emergency Medicine

## 2020-12-11 DIAGNOSIS — N898 Other specified noninflammatory disorders of vagina: Secondary | ICD-10-CM

## 2020-12-11 DIAGNOSIS — Z113 Encounter for screening for infections with a predominantly sexual mode of transmission: Secondary | ICD-10-CM | POA: Diagnosis not present

## 2020-12-11 DIAGNOSIS — R300 Dysuria: Secondary | ICD-10-CM

## 2020-12-11 LAB — POCT URINALYSIS DIPSTICK, ED / UC
Bilirubin Urine: NEGATIVE
Glucose, UA: NEGATIVE mg/dL
Hgb urine dipstick: NEGATIVE
Ketones, ur: NEGATIVE mg/dL
Leukocytes,Ua: NEGATIVE
Nitrite: NEGATIVE
Protein, ur: NEGATIVE mg/dL
Specific Gravity, Urine: 1.02 (ref 1.005–1.030)
Urobilinogen, UA: 1 mg/dL (ref 0.0–1.0)
pH: 7 (ref 5.0–8.0)

## 2020-12-11 LAB — POC URINE PREG, ED: Preg Test, Ur: NEGATIVE

## 2020-12-11 LAB — HIV ANTIBODY (ROUTINE TESTING W REFLEX): HIV Screen 4th Generation wRfx: NONREACTIVE

## 2020-12-11 NOTE — Discharge Instructions (Signed)
Your initial testing for a urinary tract infection did not suggest that you currently have an infection, but we will send this for culture to be sure.  We have also done a vaginal swab that we will test for gonorrhea, chlamydia, trichomonas, bacterial vaginosis, yeast infection.  We will contact you likely tomorrow with these results if we need to treat anything.  If your symptoms continue and do not improve over the next week, or if they worsen and you develop worsening pain, vomiting, fever, back pain, you should be seen by medical provider right away.

## 2020-12-11 NOTE — ED Provider Notes (Signed)
[place]    CSN: [id number] Arrival date & time: 12/11/20  0801      History   Chief Complaint Chief Complaint  Patient presents with   Abdominal Pain   Vaginal Discharge   Dysuria    HPI [name] is a 40 y.o. female.   DYSURIA  Pain urinating started 3 days ago. Pain is: burning Medications tried: none STD exposure: unsure, but would like to be tested Possibly pregnant: no, Patient's last menstrual period was 11/24/2020.  Symptoms Urgency: yes Frequency: no Blood in urine: no Pain in back: no Fever: no Vaginal discharge: yes, thick, white, some itching Mouth Ulcers: no Having some suprapubic pain as well      Past Medical History:  Diagnosis Date   Endometriosis    History of 2019 novel coronavirus disease (COVID-19)    pt tested positive 03-03-2019, results in care everywhere.  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   Menorrhagia 12/25/2019   Vaginal discharge 07/08/2019   GERD (gastroesophageal reflux disease) 06/07/2019   HSV-2 seropositive 03/07/2019   Hydrosalpinx 12/30/2018   Endometriosis 05/01/2011   SEPTATE UTERUS 07/24/2009   TOBACCO ABUSE 09/26/2008   Female infertility 09/26/2008   IRREGULAR MENSTRUATION 09/21/2006    Past Surgical History:  Procedure Laterality Date   BREAST BIOPSY Right 1999   per pt benign    LAPAROSCOPIC SALPINGOOPHERECTOMY Right 03-25-2010   @[facility]    and LYSIS ADHESIONS   LAPAROSCOPY N/A  05/30/2019   Procedure: DIAGNOSTIC LAPAROSCOPY;  Surgeon: Woodroe Mode, MD;  Location[facility];  Service: Gynecology;  Laterality: N/A;    OB History     Gravida  0   Para      Term      Preterm      AB      Living  0      SAB      IAB      Ectopic      Multiple      Live Births               Home Medications    Prior to Admission medications   Medication Sig Start Date End Date Taking? Authorizing Provider  clindamycin (CLEOCIN) 300 MG capsule Take 1 capsule (300 mg total) by mouth 3 (three) times daily. 11/06/20   Marney Setting, NP  predniSONE (STERAPRED UNI-PAK 21 TAB) 10 MG (21) TBPK tablet Take by mouth daily. Take 6 tabs by mouth daily  for 2 days, then 5 tabs for 2 days, then 4 tabs for 2 days, then 3 tabs for 2 days, 2 tabs for 2 days, then 1 tab by mouth daily for 2 days 11/06/20   Marney Setting, NP  cetirizine (ZYRTEC ALLERGY) 10 MG tablet Take 1 tablet (10 mg total) by mouth daily. Patient not taking: Reported on 03/21/2020 10/02/19 05/28/20  Jaynee Eagles,  PA-C  Norethindrone Acetate-Ethinyl Estrad-FE (LOESTRIN 24 FE) 1-20 MG-MCG(24) tablet Take 1 tablet by mouth daily. 03/21/20 05/28/20  Lavonia Drafts, MD  Norgestimate-Ethinyl Estradiol Triphasic (TRI-SPRINTEC) 0.18/0.215/0.25 MG-35 MCG tablet Take 1 tablet by mouth daily. 11/03/10 05/01/11  Woodroe Mode, MD    Family History Family History  Problem Relation Age of Onset   Hypertension Mother    Diabetes Mother    Cancer Father    Hypertension Father    Diabetes Brother    Hypertension Brother    Breast cancer Neg Hx     Social History Social History   Tobacco Use   Smoking status: Every Day    Packs/day: 0.25    Years: 15.00    Pack years: 3.75    Types: Cigarettes   Smokeless tobacco: Never  Vaping Use   Vaping Use: Never used  Substance Use Topics   Alcohol use: Yes    Comment: occasionally   Drug use: Never     Allergies   Metronidazole  and Sulfa antibiotics   Review of Systems Review of Systems  All other systems reviewed and are negative. Per HPI  Physical Exam Triage Vital Signs ED Triage Vitals  Enc Vitals Group     BP 12/11/20 0820 112/72     Pulse Rate 12/11/20 0820 92     Resp 12/11/20 0820 16     Temp 12/11/20 0820 98.2 F (36.8 C)     Temp Source 12/11/20 0820 Oral     SpO2 12/11/20 0820 99 %     Weight --      Height --      Head Circumference --      Peak Flow --      Pain Score 12/11/20 0818 3     Pain Loc --      Pain Edu? --      Excl. in [place]? --    No data found.  Updated Vital Signs BP 112/72 (BP Location: Right Arm)   Pulse 92   Temp 98.2 F (36.8 C) (Oral)   Resp 16   [name] 11/24/2020   SpO2 99%   Visual Acuity Right Eye Distance:   Left Eye Distance:   Bilateral Distance:    Right Eye Near:   Left Eye Near:    Bilateral Near:     Physical Exam Constitutional:      General: She is not in acute distress.    Appearance: She is well-developed. She is not ill-appearing, toxic-appearing or diaphoretic.  Pulmonary:     Effort: Pulmonary effort is normal. No respiratory distress.  Abdominal:     Palpations: Abdomen is soft.     Tenderness: There is abdominal tenderness (mild) in the suprapubic area. There is no guarding or rebound.  Skin:    General: Skin is warm and dry.  Neurological:     Mental Status: She is alert and oriented to person, place, and time.  Psychiatric:        Mood and Affect: Mood normal.        Behavior: Behavior normal.     UC Treatments / Results  Labs (all labs ordered are listed, but only abnormal results are displayed) Labs Reviewed  URINE CULTURE  HIV ANTIBODY (ROUTINE TESTING W REFLEX)  RPR  POCT URINALYSIS DIPSTICK, ED / UC  POC URINE PREG, ED  CERVICOVAGINAL ANCILLARY ONLY    EKG   Radiology No results found.  Procedures Procedures (including critical care time)  Medications Ordered in  UC Medications - No data to  display  Initial Impression / Assessment and Plan / UC Course  I have reviewed the triage vital signs and the nursing notes.  Pertinent labs & imaging results that were available during my care of the patient were reviewed by me and considered in my medical decision making (see chart for details).     UA negative for UTI, will send confirmatory culture.  Vaginal swab performed, will treat based on results.  HIV/RPR also performed.  Discussed f/u with PCP for pap smear/HPV testing.  Given ED precautions, see AVS.     Final Clinical Impressions(s) / UC Diagnoses   Final diagnoses:  Dysuria  Vaginal discharge  Screen for sexually transmitted diseases     Discharge Instructions      Your initial testing for a urinary tract infection did not suggest that you currently have an infection, but we will send this for culture to be sure.  We have also done a vaginal swab that we will test for gonorrhea, chlamydia, trichomonas, bacterial vaginosis, yeast infection.  We will contact you likely tomorrow with these results if we need to treat anything.  If your symptoms continue and do not improve over the next week, or if they worsen and you develop worsening pain, vomiting, fever, back pain, you should be seen by medical provider right away.     ED Prescriptions   None    PDMP not reviewed this encounter.   [place], Bernita Raisin, DO 12/11/20 [phone]

## 2020-12-11 NOTE — ED Triage Notes (Signed)
Pt presents with vaginal discharge, dysuria, and abdominal pain xs 2-3 days.

## 2020-12-12 ENCOUNTER — Telehealth (HOSPITAL_COMMUNITY): Payer: Self-pay | Admitting: Emergency Medicine

## 2020-12-12 LAB — CERVICOVAGINAL ANCILLARY ONLY
Bacterial Vaginitis (gardnerella): NEGATIVE
Candida Glabrata: NEGATIVE
Candida Vaginitis: NEGATIVE
Chlamydia: NEGATIVE
Comment: NEGATIVE
Comment: NEGATIVE
Comment: NEGATIVE
Comment: NEGATIVE
Comment: NEGATIVE
Comment: NORMAL
Neisseria Gonorrhea: NEGATIVE
Trichomonas: POSITIVE — AB

## 2020-12-12 LAB — URINE CULTURE

## 2020-12-12 LAB — RPR: RPR Ser Ql: NONREACTIVE

## 2020-12-12 MED ORDER — METRONIDAZOLE 500 MG PO TABS
500.0000 mg | ORAL_TABLET | Freq: Two times a day (BID) | ORAL | 0 refills | Status: DC
Start: 2020-12-12 — End: 2021-01-13

## 2020-12-12 MED ORDER — TINIDAZOLE 500 MG PO TABS: 2.0000 g | ORAL_TABLET | Freq: Once | ORAL | 0 refills | Status: AC

## 2020-12-16 ENCOUNTER — Encounter: Payer: Self-pay | Admitting: Family Medicine

## 2020-12-16 ENCOUNTER — Ambulatory Visit (INDEPENDENT_AMBULATORY_CARE_PROVIDER_SITE_OTHER): Payer: 59 | Admitting: Family Medicine

## 2020-12-16 ENCOUNTER — Other Ambulatory Visit: Payer: Self-pay

## 2020-12-16 ENCOUNTER — Other Ambulatory Visit (HOSPITAL_COMMUNITY)
Admission: RE | Admit: 2020-12-16 | Discharge: 2020-12-16 | Disposition: A | Discharge status: Home / Self Care | Payer: 59 | Source: Ambulatory Visit | Attending: Family Medicine | Admitting: Family Medicine

## 2020-12-16 VITALS — BP 114/88 | HR 91 | Ht 62.0 in | Wt 165.4 lb

## 2020-12-16 DIAGNOSIS — Z113 Encounter for screening for infections with a predominantly sexual mode of transmission: Secondary | ICD-10-CM

## 2020-12-16 DIAGNOSIS — R350 Frequency of micturition: Secondary | ICD-10-CM

## 2020-12-16 DIAGNOSIS — N898 Other specified noninflammatory disorders of vagina: Secondary | ICD-10-CM

## 2020-12-16 DIAGNOSIS — B081 Molluscum contagiosum: Secondary | ICD-10-CM

## 2020-12-16 LAB — POCT URINALYSIS DIP (MANUAL ENTRY)
Blood, UA: NEGATIVE
Glucose, UA: NEGATIVE mg/dL
Leukocytes, UA: NEGATIVE
Nitrite, UA: NEGATIVE
Spec Grav, UA: >=1.03 — AB (ref 1.010–1.025)
Urobilinogen, UA: 0.2 E.U./dL
pH, UA: 5.5 (ref 5.0–8.0)

## 2020-12-16 MED ORDER — FLUCONAZOLE 150 MG PO TABS: 150.0000 mg | ORAL_TABLET | ORAL | 0 refills | Status: DC

## 2020-12-16 MED ORDER — CLOTRIMAZOLE 1 % EX CREA: 1.0000 "application " | TOPICAL_CREAM | Freq: Two times a day (BID) | CUTANEOUS | 0 refills | Status: DC

## 2020-12-16 NOTE — Patient Instructions (Addendum)
Thank you for coming to see me today. It was a pleasure.   Take Diflucan 150 mg today.  If symptoms still present in 72 hr take the second dose. Use clotrimazole cream on the outside of vagina for irritation  Will call you with results if abnormal and need treatment  Please schedule appointment to evaluate lesions   Please follow-up with PCP  If you have any questions or concerns, please do not hesitate to call the office at [phone].  Best,   Carollee Leitz, MD

## 2020-12-17 LAB — CERVICOVAGINAL ANCILLARY ONLY
Bacterial Vaginitis (gardnerella): NEGATIVE
Candida Glabrata: NEGATIVE
Candida Vaginitis: NEGATIVE
Chlamydia: NEGATIVE
Comment: NEGATIVE
Comment: NEGATIVE
Comment: NEGATIVE
Comment: NEGATIVE
Comment: NEGATIVE
Comment: NORMAL
Neisseria Gonorrhea: NEGATIVE
Trichomonas: NEGATIVE

## 2020-12-18 ENCOUNTER — Telehealth (INDEPENDENT_AMBULATORY_CARE_PROVIDER_SITE_OTHER): Payer: 59 | Admitting: Obstetrics & Gynecology

## 2020-12-18 ENCOUNTER — Ambulatory Visit: Payer: 59

## 2020-12-18 ENCOUNTER — Other Ambulatory Visit: Payer: Self-pay

## 2020-12-18 ENCOUNTER — Encounter: Payer: Self-pay | Admitting: Family Medicine

## 2020-12-18 DIAGNOSIS — N7011 Chronic salpingitis: Secondary | ICD-10-CM

## 2020-12-18 NOTE — Progress Notes (Signed)
    SUBJECTIVE:   CHIEF COMPLAINT / HPI: STI testing  Recently tested positive for BV and Trichomonas at Urgent care.  Treated with Tinidazole 2gm and Flagyl 500 mg BID.  Patient reports has been on Flagyl for 8 days as she had been on this for BV.  Had not taken Tinidazole as reports was sent to wrong pharmacy. She continues to have vaginal irritation and foul smelling discharge.  Also endorses suprapubic pain, dysuria and urinary frequency.  She feels like she may have Trichomonas again and would like retesting for all STI.  [name] 2 weeks ago 10/16. Has been recently sexually active without condom use.     PERTINENT  PMH / PSH:  Recent STI treatment  OBJECTIVE:   BP 114/88   Pulse 91   Ht 5\' 2"  (1.575 m)   Wt 165 lb 6.4 oz (75 kg)   [name] 11/24/2020   SpO2 100%   BMI 30.25 kg/m    General: Alert, no acute distress Pelvic Exam chaperoned by [facility] April        External: normal female genitalia, multiple small central dimpling lesions noted to inner labia major bilaterally, largest one on right labia, with central dimpling         Vagina: normal without lesions or masses,  copious amount of thick clumps of white discharge in vaginal  vault, removed some discharge with 5 Fox swabs.         Cervix: normal without lesions or masses         ASSESSMENT/PLAN:   Vaginal discharge Wet prep, GC/G obtained Diflucan 150 mg today, may repeat second dose in 72hrs Clotriamozole cream BID x 7 days Urine negative, will send urine for culture and if positive will treat Follow up with results   Molluscum contagiosum Lesions likely MC.  Given limited time patient agreeable to return for further evaluation and possible removal with cryotherapy. She will make follow up appointment for 1-2 weeks     Carollee Leitz, MD [place]

## 2020-12-18 NOTE — Progress Notes (Signed)
GYNECOLOGY VIRTUAL VISIT ENCOUNTER NOTE  Provider location: [facility]   Patient location: Home  I connected with [name] on 12/25/20 at  4:10 PM EST by MyChart Video Encounter and verified that I am speaking with the correct person using two identifiers.   I discussed the limitations, risks, security and privacy concerns of performing an evaluation and management service virtually and the availability of in person appointments. I also discussed with the patient that there may be a patient responsible charge related to this service. The patient expressed understanding and agreed to proceed.   History:  [name] is a 40 y.o. G0P0 female being evaluated today for reeval of pelvic mass. She denies any abnormal vaginal discharge, bleeding, pelvic pain or other concerns.  She denies changes but remains concerned about the hydrosalpinx and wanted to make sure that it does not need further tx. She wanted to review the images.    Past Medical History:  Diagnosis Date   Endometriosis    History of 2019 novel coronavirus disease (COVID-19)    pt tested positive 03-03-2019, results in care everywhere.  (05-22-2019 per pt had symptoms of fatigue, headache, and loss of taste/ smell;  fatigue and headache after one week and loss taste/ smell resolved after 3 wks)   Infertility, female    Ovarian cyst    Pelvic mass in female    large fluid filled pelvic mass   Recurrent boils    Septate uterus    Trichomoniasis    Past Surgical History:  Procedure Laterality Date   BREAST BIOPSY Right 1999   per pt benign    LAPAROSCOPIC SALPINGOOPHERECTOMY Right 03-25-2010   @[facility]    and LYSIS ADHESIONS   LAPAROSCOPY N/A 05/30/2019   Procedure: DIAGNOSTIC LAPAROSCOPY;  Surgeon: Woodroe Mode, MD;  Location: [facility];  Service: Gynecology;  Laterality: N/A;   The following portions of the patient's history were reviewed and updated as  appropriate: allergies, current medications, past family history, past medical history, past social history, past surgical history and problem list.     Review of Systems:  Pertinent items noted in HPI and remainder of comprehensive ROS otherwise negative.  Physical Exam:   General:  Alert, oriented and cooperative. Patient appears to be in no acute distress.  Mental Status: Normal mood and affect. Normal behavior. Normal judgment and thought content.   Respiratory: Normal respiratory effort, no problems with respiration noted  Rest of physical exam deferred due to type of encounter  Labs and Imaging Results for orders placed or performed in visit on 12/16/20 (from the past 336 hour(s))  Cervicovaginal ancillary only   Collection Time: 12/16/20  9:53 AM  Result Value Ref Range   Neisseria Gonorrhea Negative    Chlamydia Negative    Trichomonas Negative    Bacterial Vaginitis (gardnerella) Negative    Candida Vaginitis Negative    Candida Glabrata Negative    Comment      Normal Reference Range Bacterial Vaginosis - Negative   Comment Normal Reference Range Candida Species - Negative    Comment Normal Reference Range Candida Galbrata - Negative    Comment Normal Reference Range Trichomonas - Negative    Comment Normal Reference Ranger Chlamydia - Negative    Comment      Normal Reference Range Neisseria Gonorrhea - Negative  POCT urinalysis dipstick   Collection Time: 12/16/20 10:00 AM  Result Value Ref Range   Color,  UA yellow yellow   Clarity, UA clear clear   Glucose, UA negative negative mg/dL   Bilirubin, UA small (A) negative   Ketones, POC UA trace (5) (A) negative mg/dL   Spec Grav, UA >=1.030 (A) 1.010 - 1.025   Blood, UA negative negative   pH, UA 5.5 5.0 - 8.0   Protein Ur, POC trace (A) negative mg/dL   Urobilinogen, UA 0.2 0.2 or 1.0 E.U./dL   Nitrite, UA Negative Negative   Leukocytes, UA Negative Negative  Urine Culture   Collection Time: 12/16/20 10:46  AM   Specimen: Urine   UR  Result Value Ref Range   Urine Culture, Routine Final report    Organism ID, Bacteria Comment    No results found.     Assessment and Plan:     1. Hydrosalpinx I reviewed the newest images and the stability of the region. The images cont to suggest hydrosalpinx and benign process. No f/u is indicated Unless her sx changes.      I discussed the assessment and treatment plan with the patient. The patient was provided an opportunity to ask questions and all were answered. The patient agreed with the plan and demonstrated an understanding of the instructions.   The patient was advised to call back or seek an in-person evaluation/go to the ED if the symptoms worsen or if the condition fails to improve as anticipated.  I provided 15 minutes of face-to-face time during this encounter.   Lavonia Drafts, MD [facility] for [employer], [place]

## 2020-12-19 ENCOUNTER — Encounter: Payer: Self-pay | Admitting: Family Medicine

## 2020-12-19 DIAGNOSIS — B081 Molluscum contagiosum: Secondary | ICD-10-CM | POA: Insufficient documentation

## 2020-12-19 NOTE — Assessment & Plan Note (Addendum)
Wet prep, GC/G obtained Diflucan 150 mg today, may repeat second dose in 72hrs Clotriamozole cream BID x 7 days Urine negative, will send urine for culture and if positive will treat Follow up with results

## 2020-12-19 NOTE — Assessment & Plan Note (Signed)
Lesions likely MC.  Given limited time patient agreeable to return for further evaluation and possible removal with cryotherapy. She will make follow up appointment for 1-2 weeks

## 2020-12-20 LAB — URINE CULTURE

## 2020-12-25 ENCOUNTER — Encounter: Payer: Self-pay | Admitting: Obstetrics & Gynecology

## 2021-01-09 ENCOUNTER — Other Ambulatory Visit (HOSPITAL_COMMUNITY)
Admission: RE | Admit: 2021-01-09 | Discharge: 2021-01-09 | Disposition: A | Discharge status: Home / Self Care | Payer: 59 | Source: Ambulatory Visit | Attending: Family Medicine | Admitting: Family Medicine

## 2021-01-09 ENCOUNTER — Other Ambulatory Visit: Payer: Self-pay

## 2021-01-09 ENCOUNTER — Ambulatory Visit (INDEPENDENT_AMBULATORY_CARE_PROVIDER_SITE_OTHER): Payer: 59 | Admitting: Student

## 2021-01-09 ENCOUNTER — Encounter: Payer: Self-pay | Admitting: Student

## 2021-01-09 VITALS — BP 104/75 | HR 91 | Temp 98.5°F | Ht 62.0 in | Wt 165.1 lb

## 2021-01-09 DIAGNOSIS — Z113 Encounter for screening for infections with a predominantly sexual mode of transmission: Secondary | ICD-10-CM | POA: Diagnosis not present

## 2021-01-09 DIAGNOSIS — R1010 Upper abdominal pain, unspecified: Secondary | ICD-10-CM

## 2021-01-09 NOTE — Patient Instructions (Signed)
It was great to see you! Thank you for allowing me to participate in your care!  I recommend that you always bring your medications to each appointment as this makes it easy to ensure you are on the correct medications and helps [place] not miss when refills are needed.  Our plans for today:  - Please let me know if you abdominal pain worsens  We are checking some labs today, I will call you if they are abnormal will send you a MyChart message or a letter if they are normal.  If you do not hear about your labs in the next 2 weeks please let us know.  Take care and seek immediate care sooner if you develop any concerns.   Dr. Precious Gilding, DO [facility]

## 2021-01-09 NOTE — Progress Notes (Signed)
    SUBJECTIVE:   CHIEF COMPLAINT / HPI: abdominal pain and STI testing   Wants STI screen today, has white thick sticky discharge for the past 4 days. Has had unprotected sex recently since her last visit on 12/16/20 when she tested negative for STI's. No dysuria.   Abdominal pain Having constant diffuse abdominal pain she describes as an ache for the past 3 days, feels like it is getting worse. Had taken pepto bismol, drinking gingerale. Did not go to work today. Hasn't eaten since yesterday due to nauesea. Has not vomited.  Had watery non bloody diarrhea two times two days ago, had a soft BM yesterday.   PERTINENT  PMH / PSH: Previously treated for STIs  OBJECTIVE:   BP 104/75   Pulse 91   Ht 5\' 2"  (1.575 m)   Wt 165 lb 2 oz (74.9 kg)   [name] 12/28/2020 (Exact Date)   SpO2 100%   BMI 30.20 kg/m    General: NAD, pleasant, able to participate in exam Cardiac: RRR, no murmurs. Respiratory: CTAB, normal effort, No wheezes, rales or rhonchi Abdomen: Bowel sounds present, diffuse tenderness to palpation but worse in epigastric area, supra pubuic tenderness, soft, non distended Genitourinary: Chaperoned by CMA.  Normal external female genitalia with multiple small circular skin colored lesions on bilateral inner labia majora.  Pink vaginal mucosa with copious amounts of thick white discharge, removed discharge with 3 Fox swabs.  Normal-appearing cervix with no lesions or masses and no cervical motion tenderness Neuro: alert, no obvious focal deficits Psych: Normal affect and mood  ASSESSMENT/PLAN:   Vaginal discharge -Cervicovaginal ancillary testing -RPR test -HIV test  Abdominal pain Given that abdominal pain is epigastric in nature and patient seems very concerned, will run a few labs to rule out gallbladder, pancreatic, and liver issues.   -CBC -CMP -Lipase  Possible molluscum contagiosum Lesions were seen at previous visit on 12/16/2020 by Dr. Verl Blalock she suspected these  lesions were molluscum contagiosum.  Patient states she will return for further evaluation and possible removal with cryotherapy.  Dr. Precious Gilding, [place]

## 2021-01-10 ENCOUNTER — Encounter: Payer: Self-pay | Admitting: Obstetrics & Gynecology

## 2021-01-10 LAB — COMPREHENSIVE METABOLIC PANEL
ALT: 12 IU/L (ref 0–32)
AST: 19 IU/L (ref 0–40)
Albumin/Globulin Ratio: 1.6 (ref 1.2–2.2)
Albumin: 4.5 g/dL (ref 3.8–4.8)
Alkaline Phosphatase: 72 IU/L (ref 44–121)
BUN/Creatinine Ratio: 9 (ref 9–23)
BUN: 9 mg/dL (ref 6–24)
Bilirubin Total: 0.2 mg/dL (ref 0.0–1.2)
CO2: 25 mmol/L (ref 20–29)
Calcium: 9.8 mg/dL (ref 8.7–10.2)
Chloride: 106 mmol/L (ref 96–106)
Creatinine, Ser: 0.96 mg/dL (ref 0.57–1.00)
Globulin, Total: 2.8 g/dL (ref 1.5–4.5)
Glucose: 80 mg/dL (ref 70–99)
Potassium: 5.1 mmol/L (ref 3.5–5.2)
Sodium: 142 mmol/L (ref 134–144)
Total Protein: 7.3 g/dL (ref 6.0–8.5)
eGFR: 76 mL/min/{1.73_m2} (ref >59)

## 2021-01-10 LAB — CBC
Hematocrit: 37.7 % (ref 34.0–46.6)
Hemoglobin: 12.4 g/dL (ref 11.1–15.9)
MCH: 28.4 pg (ref 26.6–33.0)
MCHC: 32.9 g/dL (ref 31.5–35.7)
MCV: 86 fL (ref 79–97)
Platelets: 366 10*3/uL (ref 150–450)
RBC: 4.37 x10E6/uL (ref 3.77–5.28)
RDW: 13.9 % (ref 11.7–15.4)
WBC: 5.5 10*3/uL (ref 3.4–10.8)

## 2021-01-10 LAB — LIPASE: Lipase: 36 U/L (ref 14–72)

## 2021-01-10 LAB — HIV ANTIBODY (ROUTINE TESTING W REFLEX): HIV Screen 4th Generation wRfx: NONREACTIVE

## 2021-01-13 ENCOUNTER — Telehealth: Payer: Self-pay

## 2021-01-13 ENCOUNTER — Other Ambulatory Visit: Payer: Self-pay | Admitting: Student

## 2021-01-13 DIAGNOSIS — B9689 Other specified bacterial agents as the cause of diseases classified elsewhere: Secondary | ICD-10-CM

## 2021-01-13 LAB — RPR W/REFLEX TO TREPSURE: RPR: NONREACTIVE

## 2021-01-13 LAB — CERVICOVAGINAL ANCILLARY ONLY
Bacterial Vaginitis (gardnerella): POSITIVE — AB
Candida Glabrata: NEGATIVE
Candida Vaginitis: NEGATIVE
Chlamydia: NEGATIVE
Comment: NEGATIVE
Comment: NEGATIVE
Comment: NEGATIVE
Comment: NEGATIVE
Comment: NEGATIVE
Comment: NORMAL
Neisseria Gonorrhea: NEGATIVE
Trichomonas: NEGATIVE

## 2021-01-13 LAB — T PALLIDUM ANTIBODY, EIA: T pallidum Antibody, EIA: NEGATIVE

## 2021-01-13 MED ORDER — METRONIDAZOLE 500 MG PO TABS: 500.0000 mg | ORAL_TABLET | Freq: Two times a day (BID) | ORAL | 0 refills | Status: AC

## 2021-01-13 NOTE — Telephone Encounter (Signed)
Patient calls nurse line reporting she saw positive BV results on mychart.   Patient is requesting treatment to her pharmacy. Please advise.

## 2021-01-30 ENCOUNTER — Other Ambulatory Visit: Payer: Self-pay

## 2021-01-30 ENCOUNTER — Ambulatory Visit (HOSPITAL_COMMUNITY)
Admission: EM | Admit: 2021-01-30 | Discharge: 2021-01-30 | Disposition: A | Discharge status: Home / Self Care | Payer: 59 | Attending: Student | Admitting: Student

## 2021-01-30 ENCOUNTER — Ambulatory Visit (INDEPENDENT_AMBULATORY_CARE_PROVIDER_SITE_OTHER): Payer: 59

## 2021-01-30 ENCOUNTER — Encounter (HOSPITAL_COMMUNITY): Payer: Self-pay | Admitting: Emergency Medicine

## 2021-01-30 DIAGNOSIS — M79642 Pain in left hand: Secondary | ICD-10-CM | POA: Diagnosis not present

## 2021-01-30 MED ORDER — METHYLPREDNISOLONE SODIUM SUCC 125 MG IJ SOLR
INTRAMUSCULAR | Status: AC
  Filled 2021-01-30: qty 2

## 2021-01-30 MED ORDER — METHYLPREDNISOLONE SODIUM SUCC 125 MG IJ SOLR
60.0000 mg | Freq: Once | INTRAMUSCULAR | Status: AC
  Administered 2021-01-30: 09:00:00 60 mg via INTRAMUSCULAR

## 2021-01-30 MED ORDER — TIZANIDINE HCL 2 MG PO TABS: 2.0000 mg | ORAL_TABLET | Freq: Three times a day (TID) | ORAL | 0 refills | Status: DC | PRN

## 2021-01-30 NOTE — ED Triage Notes (Signed)
Pt is resent today with right hand pain radiating up the right arm. Pt states pain started x2 days ago. Denies any injury

## 2021-01-30 NOTE — Discharge Instructions (Addendum)
-  Your xray was normal.  -You may have a pinched nerve. This should get better on its own, but we will treat it with prednisone and zanaflex (muscle relaxer) -Start the muscle relaxer-Zanaflex (tizanidine), up to 3 times daily for muscle spasms and pain.  This can make you drowsy, so take at bedtime or when you do not need to drive or operate machinery. -You can take Tylenol up to 1000 mg 3 times daily, and ibuprofen up to 600 mg 3 times daily with food.  You can take these together, or alternate every 3-4 hours. -Heat/ice, rest

## 2021-01-30 NOTE — ED Provider Notes (Addendum)
[place]    CSN: [id number] Arrival date & time: 01/30/21  0800      History   Chief Complaint Chief Complaint  Patient presents with   Hand Pain    HPI [name] is a 40 y.o. female presenting with left arm and hand pain. Medical history noncontributory.  Describes 2 days of nontraumatic left hand pain radiating up the arm.  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  Pain is worse over the dorsum of the hand, and seems to extend from the wrist to the fingers.  States there is some tingling in her fingers 2 through 4, unable to identify triggers for this.  Has attempted various over-the-counter NSAIDs without relief, states the pain is unbearable.  She is right-handed.  Denies neck, back pain.  HPI  Past Medical History:  Diagnosis Date   Endometriosis    History of 2019 novel coronavirus disease (COVID-19)    pt tested positive 03-03-2019, results in care everywhere.  (05-22-2019 per pt had symptoms of fatigue, headache, and loss of taste/ smell;  fatigue and headache after one week and loss taste/ smell resolved after 3 wks)   Infertility, female    Ovarian cyst    Pelvic mass in female    large fluid filled pelvic mass   Recurrent boils    Septate uterus    Trichomoniasis     Patient Active Problem List   Diagnosis Date Noted   Molluscum contagiosum 12/19/2020   Abdominal pain 08/16/2020   Constipation 08/16/2020   Frequent urination 06/24/2020   Hydradenitis 06/24/2020   Ganglion cyst of left foot 06/13/2020   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   Menorrhagia 12/25/2019   Vaginal discharge 07/08/2019   GERD (gastroesophageal reflux disease) 06/07/2019   HSV-2 seropositive 03/07/2019   Hydrosalpinx 12/30/2018   Endometriosis 05/01/2011   SEPTATE UTERUS 07/24/2009   TOBACCO ABUSE 09/26/2008   Female infertility 09/26/2008   IRREGULAR MENSTRUATION 09/21/2006    Past Surgical History:  Procedure Laterality Date   BREAST BIOPSY  Right 1999   per pt benign    LAPAROSCOPIC SALPINGOOPHERECTOMY Right 03-25-2010   @[facility]    and LYSIS ADHESIONS   LAPAROSCOPY N/A 05/30/2019   Procedure: DIAGNOSTIC LAPAROSCOPY;  Surgeon: Woodroe Mode, MD;  Location: [facility];  Service: Gynecology;  Laterality: N/A;    OB History     Gravida  0   Para      Term      Preterm      AB      Living  0      SAB      IAB      Ectopic      Multiple      Live Births               Home Medications    Prior to Admission medications   Medication Sig Start Date End Date Taking? Authorizing Provider  tiZANidine (ZANAFLEX) 2 MG tablet Take 1 tablet (2 mg total) by mouth every 8 (eight) hours as needed for muscle spasms. 01/30/21  Yes Hazel Sams, PA-C  cetirizine (ZYRTEC ALLERGY) 10 MG tablet Take 1 tablet (10 mg total) by mouth daily. Patient not taking: Reported on 03/21/2020 10/02/19 05/28/20  Jaynee Eagles, PA-C  Norethindrone Acetate-Ethinyl Estrad-FE (LOESTRIN 24 FE) 1-20 MG-MCG(24) tablet Take 1 tablet by mouth daily. 03/21/20 05/28/20  Lavonia Drafts, MD  Norgestimate-Ethinyl Estradiol Triphasic (TRI-SPRINTEC) 0.18/0.215/0.25 MG-35 MCG tablet Take  1 tablet by mouth daily. 11/03/10 05/01/11  Woodroe Mode, MD    Family History Family History  Problem Relation Age of Onset   Hypertension Mother    Diabetes Mother    Cancer Father    Hypertension Father    Diabetes Brother    Hypertension Brother    Breast cancer Neg Hx     Social History Social History   Tobacco Use   Smoking status: Every Day    Packs/day: 0.25    Years: 15.00    Pack years: 3.75    Types: Cigarettes   Smokeless tobacco: Never  Vaping Use   Vaping Use: Never used  Substance Use Topics   Alcohol use: Yes    Comment: occasionally   Drug use: Never     Allergies   Sulfa antibiotics   Review of Systems Review of Systems  Musculoskeletal:        L hand pain  All other systems reviewed and are  negative.   Physical Exam Triage Vital Signs ED Triage Vitals  Enc Vitals Group     BP      Pulse      Resp      Temp      Temp src      SpO2      Weight      Height      Head Circumference      Peak Flow      Pain Score      Pain Loc      Pain Edu?      Excl. in [place]?    No data found.  Updated Vital Signs BP 127/82    Pulse 80    Temp 98.9 F (37.2 C) (Oral)    Resp 17    SpO2 100%   Visual Acuity Right Eye Distance:   Left Eye Distance:   Bilateral Distance:    Right Eye Near:   Left Eye Near:    Bilateral Near:     Physical Exam Vitals reviewed.  Constitutional:      General: She is not in acute distress.    Appearance: Normal appearance. She is not ill-appearing.  HENT:     Head: Normocephalic and atraumatic.  Pulmonary:     Effort: Pulmonary effort is normal.  Musculoskeletal:     Comments: L hand- no effusion or skin changes. No point tenderness. Some pain elicited with palpation MCP joints 2-4 and wrist. ROM fingers and wrist intact and with stiffness/pain. No elbow pain at rest or with movement. No neck or back pain. No midline spinous deformity or stepoff. Negative spurling bilaterally. Strength and sensation intact. No snuffbox tenderness. Radial pulse 2+, cap refill <2 seconds.  Neurological:     General: No focal deficit present.     Mental Status: She is alert and oriented to person, place, and time.  Psychiatric:        Mood and Affect: Mood normal.        Behavior: Behavior normal.        Thought Content: Thought content normal.        Judgment: Judgment normal.     UC Treatments / Results  Labs (all labs ordered are listed, but only abnormal results are displayed) Labs Reviewed - No data to display  EKG   Radiology DG Hand Complete Left  Result Date: 01/30/2021 CLINICAL DATA:  40 year old female with left hand pain for 3 days. No known injury. Limited range of  motion. EXAM: LEFT HAND - COMPLETE 3+ VIEW COMPARISON:  Left middle  finger series 12/06/2009. FINDINGS: Bone mineralization is within normal limits. There is no evidence of fracture or dislocation. There is no evidence of arthropathy or other focal bone abnormality. Soft tissues are unremarkable. IMPRESSION: Negative. Electronically Signed   By: Genevie Ann M.D.   On: 01/30/2021 08:47    Procedures Procedures (including critical care time)  Medications Ordered in UC Medications  methylPREDNISolone sodium succinate (SOLU-MEDROL) 125 mg/2 mL injection 60 mg (has no administration in time range)    Initial Impression / Assessment and Plan / UC Course  I have reviewed the triage vital signs and the nursing notes.  Pertinent labs & imaging results that were available during my care of the patient were reviewed by me and considered in my medical decision making (see chart for details).     This patient is a very pleasant 40 y.o. year old female presenting with L hand pain, nontraumatic. Neurovascularly intact.  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 Negative.   Ddx includes radiculopathy. IM solumedrol  administered, zanaflex sent, tylenol/ibuprofen.   Wrist brace provided at patient request.  ED return precautions discussed. Patient verbalizes understanding and agreement.    Final Clinical Impressions(s) / UC Diagnoses   Final diagnoses:  Left hand pain     Discharge Instructions      -Your xray was normal.  -You may have a pinched nerve. This should get better on its own, but we will treat it with prednisone and zanaflex (muscle relaxer) -Start the muscle relaxer-Zanaflex (tizanidine), up to 3 times daily for muscle spasms and pain.  This can make you drowsy, so take at bedtime or when you do not need to drive or operate machinery. -You can take Tylenol up to 1000 mg 3 times daily, and ibuprofen up to 600 mg 3 times daily with food.  You can take these together, or alternate every 3-4  hours. -Heat/ice, rest     ED Prescriptions     Medication Sig Dispense Auth. Provider   tiZANidine (ZANAFLEX) 2 MG tablet Take 1 tablet (2 mg total) by mouth every 8 (eight) hours as needed for muscle spasms. 21 tablet Hazel Sams, PA-C      PDMP not reviewed this encounter.   Hazel Sams, PA-C 01/30/21 0905    Hazel Sams, PA-C 01/30/21 [id number]

## 2021-02-07 ENCOUNTER — Encounter: Payer: Self-pay | Admitting: Obstetrics & Gynecology

## 2021-03-06 ENCOUNTER — Other Ambulatory Visit (HOSPITAL_COMMUNITY)
Admission: RE | Admit: 2021-03-06 | Discharge: 2021-03-06 | Disposition: A | Discharge status: Home / Self Care | Payer: Commercial Managed Care - HMO | Source: Ambulatory Visit | Attending: Family Medicine | Admitting: Family Medicine

## 2021-03-06 ENCOUNTER — Other Ambulatory Visit: Payer: Self-pay

## 2021-03-06 ENCOUNTER — Ambulatory Visit (INDEPENDENT_AMBULATORY_CARE_PROVIDER_SITE_OTHER): Payer: Managed Care, Other (non HMO) | Admitting: Family Medicine

## 2021-03-06 ENCOUNTER — Encounter: Payer: Self-pay | Admitting: Family Medicine

## 2021-03-06 VITALS — BP 114/74 | HR 95 | Ht 62.0 in | Wt 165.6 lb

## 2021-03-06 DIAGNOSIS — Z202 Contact with and (suspected) exposure to infections with a predominantly sexual mode of transmission: Secondary | ICD-10-CM | POA: Diagnosis not present

## 2021-03-06 DIAGNOSIS — N939 Abnormal uterine and vaginal bleeding, unspecified: Secondary | ICD-10-CM

## 2021-03-06 DIAGNOSIS — N921 Excessive and frequent menstruation with irregular cycle: Secondary | ICD-10-CM

## 2021-03-06 DIAGNOSIS — Z113 Encounter for screening for infections with a predominantly sexual mode of transmission: Secondary | ICD-10-CM | POA: Diagnosis present

## 2021-03-06 LAB — POCT WET PREP (WET MOUNT)
Clue Cells Wet Prep Whiff POC: NEGATIVE
Trichomonas Wet Prep HPF POC: ABSENT

## 2021-03-06 LAB — POCT URINE PREGNANCY: Preg Test, Ur: NEGATIVE

## 2021-03-06 NOTE — Patient Instructions (Signed)
I place a message in MyChart about the test results, including any treatment if necessary. Generally, infections don't cause bleeding.  Abnormal periods might be caused by the fibroids we know you have or by you hormonal cycle being messed up temporarily.  I can treat this with hormones if this becomes a problem.  Generally, it is best to ride through the first abnormal bleeding episode,to see if you get back to normal on your own.

## 2021-03-06 NOTE — Assessment & Plan Note (Signed)
Now with menometrorhagia, no just menorrhagia.  Typical causes would be hormonal (e.g. perimenopausal) or anatomic (from submucosal fibroids.)  Best to follow up with gyn.  No intervention for current bleeding.

## 2021-03-06 NOTE — Assessment & Plan Note (Signed)
Wet prep reassuring (no trich.)  Oral and cervical swabs for GC and chlamydia.

## 2021-03-06 NOTE — Progress Notes (Signed)
° ° °  SUBJECTIVE:   CHIEF COMPLAINT / HPI:   Metrorrhagia.  Patient with long and complicated gyn hx.  Not on birth control.  "I can't have kids."  Has endometriosis, right hydrosalpinx and s/p right oophorectomy.  Last pelvic ultrasound 11/22/20, showed normal left ovary and two submucosal fibroids.  Had normal for her menstrual period beginning 02/20/21 and ending 02/27/21.  Then began bleeding again on 03/05/21.  Currently on menses.  In the past, she has had heavy, painful periods to the point gyn suggested OCPs as treatment.  She is not taking. This is her first episode of metrorhagia. Wants tested for STDs.  Other than bleeding, no sx.  Denies fever or abd pain.  With bleeding, she is uncertain if any discharge.  Wants tested for STDs due to exposure.  (Wet prep, gc and chlamydia.  Not HIV or RPR which she had tested on 01/09/21)    OBJECTIVE:   BP 114/74    Pulse 95    Ht 5\' 2"  (1.575 m)    Wt 165 lb 9.6 oz (75.1 kg)    [name] 03/05/2021    SpO2 100%    BMI 30.29 kg/m   Abd benign Pelvic is WNL.  Blood coming from cervical os.  No uterine tenderness.    ASSESSMENT/PLAN:   Menometrorrhagia Now with menometrorhagia, no just menorrhagia.  Typical causes would be hormonal (e.g. perimenopausal) or anatomic (from submucosal fibroids.)  Best to follow up with gyn.  No intervention for current bleeding.    Exposure to sexually transmitted disease (STD) Wet prep reassuring (no trich.)  Oral and cervical swabs for GC and chlamydia.     Zenia Resides, MD [place]

## 2021-03-07 LAB — CERVICOVAGINAL ANCILLARY ONLY
Chlamydia: NEGATIVE
Chlamydia: NEGATIVE
Comment: NEGATIVE
Comment: NORMAL
Comment: NEGATIVE
Comment: NORMAL
Neisseria Gonorrhea: NEGATIVE
Neisseria Gonorrhea: NEGATIVE

## 2021-03-08 ENCOUNTER — Encounter: Payer: Self-pay | Admitting: Obstetrics & Gynecology

## 2021-03-09 ENCOUNTER — Encounter: Payer: Self-pay | Admitting: Obstetrics & Gynecology

## 2021-03-10 ENCOUNTER — Encounter: Payer: Self-pay | Admitting: Family Medicine

## 2021-03-10 ENCOUNTER — Encounter: Payer: Self-pay | Admitting: Obstetrics & Gynecology

## 2021-03-12 ENCOUNTER — Other Ambulatory Visit (HOSPITAL_COMMUNITY)
Admission: RE | Admit: 2021-03-12 | Discharge: 2021-03-12 | Disposition: A | Discharge status: Home / Self Care | Payer: Commercial Managed Care - HMO | Source: Ambulatory Visit | Attending: Obstetrics & Gynecology | Admitting: Obstetrics & Gynecology

## 2021-03-12 ENCOUNTER — Ambulatory Visit: Payer: Managed Care, Other (non HMO) | Admitting: Obstetrics & Gynecology

## 2021-03-12 ENCOUNTER — Encounter: Payer: Self-pay | Admitting: Obstetrics & Gynecology

## 2021-03-12 ENCOUNTER — Other Ambulatory Visit: Payer: Self-pay

## 2021-03-12 VITALS — BP 120/74 | HR 75 | Wt 167.0 lb

## 2021-03-12 DIAGNOSIS — Z113 Encounter for screening for infections with a predominantly sexual mode of transmission: Secondary | ICD-10-CM

## 2021-03-12 NOTE — Progress Notes (Signed)
History:  41 y.o. G0P0 here today for eval of abnormal uterine bleeding. Pt reports that she had 2 cycles this past month. This was the first time this has happened. She recently received the COVID vaccine.  She went to the ED when this occurred. She does not have any additional sx. She was seen in the urgent care center and screening for STIs but, she is concerned that it was in error. She has no known exposure to STIs.   She is not bleeding currently.    The following portions of the patient's history were reviewed and updated as appropriate: allergies, current medications, past family history, past medical history, past social history, past surgical history and problem list.  Review of Systems:  Pertinent items are noted in HPI.    Objective:  Physical Exam Last menstrual period 03/05/2021.  CONSTITUTIONAL: Well-developed, well-nourished female in no acute distress.  HENT:  Normocephalic, atraumatic EYES: Conjunctivae and EOM are normal. No scleral icterus.  NECK: Normal range of motion SKIN: Skin is warm and dry. No rash noted. Not diaphoretic.No pallor. [place]: Alert and oriented to person, place, and time. Normal coordination.  Pelvic: not done   Assessment & Plan:  Diagnoses and all orders for this visit:  Routine screening for STI (sexually transmitted infection) -     GC/Chlamydia probe amp ([place])not at [facility]   F/u prn change in sx  Total face-to-face time with patient, review of chart, discussion with consultant and coordination of care was 62min.    Savior Himebaugh L. Harraway-Smith, M.D., [place]

## 2021-03-13 ENCOUNTER — Other Ambulatory Visit: Payer: Self-pay

## 2021-03-13 ENCOUNTER — Ambulatory Visit (INDEPENDENT_AMBULATORY_CARE_PROVIDER_SITE_OTHER): Payer: Managed Care, Other (non HMO) | Admitting: Family Medicine

## 2021-03-13 ENCOUNTER — Other Ambulatory Visit (HOSPITAL_COMMUNITY)
Admission: RE | Admit: 2021-03-13 | Discharge: 2021-03-13 | Disposition: A | Discharge status: Home / Self Care | Payer: Commercial Managed Care - HMO | Source: Ambulatory Visit | Attending: Family Medicine | Admitting: Family Medicine

## 2021-03-13 VITALS — BP 107/77 | HR 92 | Ht 62.0 in | Wt 170.4 lb

## 2021-03-13 DIAGNOSIS — N921 Excessive and frequent menstruation with irregular cycle: Secondary | ICD-10-CM

## 2021-03-13 DIAGNOSIS — Z113 Encounter for screening for infections with a predominantly sexual mode of transmission: Secondary | ICD-10-CM | POA: Insufficient documentation

## 2021-03-13 DIAGNOSIS — Z1159 Encounter for screening for other viral diseases: Secondary | ICD-10-CM | POA: Diagnosis not present

## 2021-03-13 DIAGNOSIS — G4700 Insomnia, unspecified: Secondary | ICD-10-CM

## 2021-03-13 LAB — POCT HEMOGLOBIN: Hemoglobin: 11 g/dL (ref 11–14.6)

## 2021-03-13 NOTE — Patient Instructions (Signed)
Thank you for coming in today.  When your testing results return I will notify you via MyChart or phone.  We also discussed sleep hygiene and trying melatonin or magnesium Gummies.  Dr. Janus Molder

## 2021-03-13 NOTE — Progress Notes (Signed)
° ° °  SUBJECTIVE:   CHIEF COMPLAINT / HPI:   [name] is a 41 yo F who presents for repeat STD testing. States she was on her menstrual cycle last visit and did not feel the testing was accurate at that time and was told she could return to repeat.   STD testing Last Pap UTD Patient's last menstrual period was 03/05/2021. Contraception: BTL 2012   Concerns: Hx of heavy menses, recent OBGYN visit. Desires hemoglobin testing.    PERTINENT  PMH / PSH: Septate uterus, HSV-2  OBJECTIVE:   BP 107/77    Pulse 92    Ht 5\' 2"  (1.575 m)    Wt 170 lb 6 oz (77.3 kg)    [name] 03/05/2021 Comment: period first started on 1/12 and ended 1/19   SpO2 100%    BMI 31.16 kg/m   Physical Exam Vitals reviewed. Exam conducted with a chaperone present.  Constitutional:      General: She is not in acute distress.    Appearance: She is not ill-appearing or toxic-appearing.  Pulmonary:     Effort: Pulmonary effort is normal.  Genitourinary:    General: Normal vulva.     Exam position: Lithotomy position.     Labia:        Right: No rash, tenderness or lesion.        Left: No rash, tenderness, lesion or injury.      Vagina: Normal.     Cervix: Normal.  Neurological:     Mental Status: She is alert and oriented to person, place, and time.  Psychiatric:        Mood and Affect: Mood normal.        Behavior: Behavior normal.   ASSESSMENT/PLAN:   1. Screening examination for sexually transmitted disease - Cervicovaginal ancillary only - RPR - HIV antibody (with reflex)  2. Screening for viral disease - RPR - HIV antibody (with reflex)  3. Menorrhagia with irregular cycle Hx of submucosal fibroids on 11/2020 [place]. Has GYN follow up scheduled.  - Hemoglobin  4. Insomnia  Discussed melatonin or magnesium gummies. Encouraged proper sleep hygiene.   Gerlene Fee, [place]

## 2021-03-14 ENCOUNTER — Telehealth: Payer: Self-pay | Admitting: Family Medicine

## 2021-03-14 DIAGNOSIS — B9689 Other specified bacterial agents as the cause of diseases classified elsewhere: Secondary | ICD-10-CM

## 2021-03-14 LAB — CERVICOVAGINAL ANCILLARY ONLY
Bacterial Vaginitis (gardnerella): POSITIVE — AB
Candida Glabrata: NEGATIVE
Candida Vaginitis: NEGATIVE
Chlamydia: NEGATIVE
Comment: NEGATIVE
Comment: NEGATIVE
Comment: NEGATIVE
Comment: NEGATIVE
Comment: NEGATIVE
Comment: NORMAL
Neisseria Gonorrhea: NEGATIVE
Trichomonas: NEGATIVE

## 2021-03-14 LAB — HIV ANTIBODY (ROUTINE TESTING W REFLEX): HIV Screen 4th Generation wRfx: NONREACTIVE

## 2021-03-14 LAB — GC/CHLAMYDIA PROBE AMP (~~LOC~~) NOT AT ARMC
Chlamydia: NEGATIVE
Comment: NEGATIVE
Comment: NORMAL
Neisseria Gonorrhea: NEGATIVE

## 2021-03-14 LAB — RPR: RPR Ser Ql: NONREACTIVE

## 2021-03-14 MED ORDER — METRONIDAZOLE 500 MG PO TABS: 500.0000 mg | ORAL_TABLET | Freq: Two times a day (BID) | ORAL | 0 refills | Status: AC

## 2021-03-14 NOTE — Telephone Encounter (Signed)
Patient made aware of positive BV results treatment sent to pharmacy.  Gerlene Fee, DO 03/14/2021, 4:13 PM PGY-3, [place]

## 2021-03-17 ENCOUNTER — Other Ambulatory Visit: Payer: Self-pay

## 2021-03-17 ENCOUNTER — Encounter (HOSPITAL_COMMUNITY): Payer: Self-pay

## 2021-03-17 ENCOUNTER — Encounter: Payer: Self-pay | Admitting: Obstetrics & Gynecology

## 2021-03-17 ENCOUNTER — Ambulatory Visit (HOSPITAL_COMMUNITY)
Admission: RE | Admit: 2021-03-17 | Discharge: 2021-03-17 | Disposition: A | Discharge status: Home / Self Care | Payer: Commercial Managed Care - HMO | Source: Ambulatory Visit | Attending: Urgent Care | Admitting: Urgent Care

## 2021-03-17 VITALS — BP 111/75 | HR 83 | Temp 98.7°F | Resp 16

## 2021-03-17 DIAGNOSIS — N760 Acute vaginitis: Secondary | ICD-10-CM | POA: Insufficient documentation

## 2021-03-17 DIAGNOSIS — N939 Abnormal uterine and vaginal bleeding, unspecified: Secondary | ICD-10-CM | POA: Diagnosis present

## 2021-03-17 DIAGNOSIS — B9689 Other specified bacterial agents as the cause of diseases classified elsewhere: Secondary | ICD-10-CM | POA: Insufficient documentation

## 2021-03-17 DIAGNOSIS — N898 Other specified noninflammatory disorders of vagina: Secondary | ICD-10-CM | POA: Insufficient documentation

## 2021-03-17 MED ORDER — FLUCONAZOLE 150 MG PO TABS: 150.0000 mg | ORAL_TABLET | Freq: Once | ORAL | 0 refills | Status: AC

## 2021-03-17 NOTE — Discharge Instructions (Addendum)
Please pick up the metronidazole and fluconazole to take for your bacterial vaginosis infection and your yeast infection. We will let you know your results by mychart. Follow up with your PCP for HPV and pap smear testing.

## 2021-03-17 NOTE — ED Provider Notes (Signed)
[place]   MRN: [id number] DOB: 1980-05-26  Subjective:   [name] is a 41 y.o. female presenting for 3 day history of vaginal itching, burning. Patient just had a visit with her primary care provider. Labs are as below.  I reviewed them with her but patient insists on having repeat testing for her cervical swab.  Results were also conveyed to her by her provider and prescription sent for metronidazole and fluconazole.  She has not picked up these medications.  She still wants to have testing repeated just to make sure.  No change in her symptoms.  She did have an irregular cycle this past months.  And has had some spotting.  No current facility-administered medications for this encounter.  Current Outpatient Medications:    fluconazole (DIFLUCAN) 150 MG tablet, Take 1 tablet (150 mg total) by mouth once for 1 dose., Disp: 1 tablet, Rfl: 0   metroNIDAZOLE (FLAGYL) 500 MG tablet, Take 1 tablet (500 mg total) by mouth 2 (two) times daily for 7 days., Disp: 14 tablet, Rfl: 0   tiZANidine (ZANAFLEX) 2 MG tablet, Take 1 tablet (2 mg total) by mouth every 8 (eight) hours as needed for muscle spasms. (Patient not taking: Reported on 03/12/2021), Disp: 21 tablet, Rfl: 0   Allergies  Allergen Reactions   Sulfa Antibiotics Other (See Comments)    Upset stomache    Past Medical History:  Diagnosis Date   Endometriosis    History of 2019 novel coronavirus disease (COVID-19)    pt tested positive 03-03-2019, results in care everywhere.  (05-22-2019 per pt had symptoms of fatigue, headache, and loss of taste/ smell;  fatigue and headache after one week and loss taste/ smell resolved after 3 wks)   Infertility, female    Ovarian cyst    Pelvic mass in female    large fluid filled pelvic mass   Recurrent boils    Septate uterus    Trichomoniasis      Past Surgical History:  Procedure Laterality Date   BREAST BIOPSY Right 1999   per pt benign    LAPAROSCOPIC  SALPINGOOPHERECTOMY Right 03-25-2010   '@[facility]'$    and LYSIS ADHESIONS   LAPAROSCOPY N/A 05/30/2019   Procedure: DIAGNOSTIC LAPAROSCOPY;  Surgeon: Woodroe Mode, MD;  Location: [facility];  Service: Gynecology;  Laterality: N/A;    Family History  Problem Relation Age of Onset   Hypertension Mother    Diabetes Mother    Cancer Father    Hypertension Father    Diabetes Brother    Hypertension Brother    Breast cancer Neg Hx     Social History   Tobacco Use   Smoking status: Every Day    Packs/day: 0.25    Years: 15.00    Pack years: 3.75    Types: Cigarettes   Smokeless tobacco: Never  Vaping Use   Vaping Use: Never used  Substance Use Topics   Alcohol use: Yes    Comment: occasionally   Drug use: Never    ROS   Objective:   Vitals: BP 111/75 (BP Location: Right Arm)    Pulse 83    Temp 98.7 F (37.1 C) (Oral)    Resp 16    [name] 03/05/2021 Comment: period first started on 1/12 and ended 1/19   SpO2 100%   Physical Exam Constitutional:      General: She is not in acute distress.    Appearance: Normal appearance. She  is well-developed. She is not ill-appearing, toxic-appearing or diaphoretic.  HENT:     Head: Normocephalic and atraumatic.     Nose: Nose normal.     Mouth/Throat:     Mouth: Mucous membranes are moist.  Eyes:     General: No scleral icterus.       Right eye: No discharge.        Left eye: No discharge.     Extraocular Movements: Extraocular movements intact.  Cardiovascular:     Rate and Rhythm: Normal rate.  Pulmonary:     Effort: Pulmonary effort is normal.  Skin:    General: Skin is warm and dry.  Neurological:     General: No focal deficit present.     Mental Status: She is alert and oriented to person, place, and time.  Psychiatric:        Mood and Affect: Mood normal.        Behavior: Behavior normal.        Thought Content: Thought content normal.        Judgment: Judgment normal.    Recent Results (from the past  2160 hour(s))  Cervicovaginal ancillary only     Status: Abnormal   Collection Time: 01/09/21  3:53 PM  Result Value Ref Range   Neisseria Gonorrhea Negative    Chlamydia Negative    Trichomonas Negative    Bacterial Vaginitis (gardnerella) Positive (A)    Candida Vaginitis Negative    Candida Glabrata Negative    Comment      Normal Reference Range Bacterial Vaginosis - Negative   Comment Normal Reference Range Candida Species - Negative    Comment Normal Reference Range Candida Galbrata - Negative    Comment Normal Reference Range Trichomonas - Negative    Comment Normal Reference Ranger Chlamydia - Negative    Comment      Normal Reference Range Neisseria Gonorrhea - Negative  CBC     Status: None   Collection Time: 01/09/21  4:28 PM  Result Value Ref Range   WBC 5.5 3.4 - 10.8 x10E3/uL   RBC 4.37 3.77 - 5.28 x10E6/uL   Hemoglobin 12.4 11.1 - 15.9 g/dL   Hematocrit 37.7 34.0 - 46.6 %   MCV 86 79 - 97 fL   MCH 28.4 26.6 - 33.0 pg   MCHC 32.9 31.5 - 35.7 g/dL   RDW 13.9 11.7 - 15.4 %   Platelets 366 150 - 450 x10E3/uL  Lipase     Status: None   Collection Time: 01/09/21  4:28 PM  Result Value Ref Range   Lipase 36 14 - 72 U/L  Comprehensive metabolic panel     Status: None   Collection Time: 01/09/21  4:28 PM  Result Value Ref Range   Glucose 80 70 - 99 mg/dL   BUN 9 6 - 24 mg/dL   Creatinine, Ser 0.96 0.57 - 1.00 mg/dL   eGFR 76 >59 mL/min/1.73   BUN/Creatinine Ratio 9 9 - 23   Sodium 142 134 - 144 mmol/L   Potassium 5.1 3.5 - 5.2 mmol/L   Chloride 106 96 - 106 mmol/L   CO2 25 20 - 29 mmol/L   Calcium 9.8 8.7 - 10.2 mg/dL   Total Protein 7.3 6.0 - 8.5 g/dL   Albumin 4.5 3.8 - 4.8 g/dL   Globulin, Total 2.8 1.5 - 4.5 g/dL   Albumin/Globulin Ratio 1.6 1.2 - 2.2   Bilirubin Total 0.2 0.0 - 1.2 mg/dL   Alkaline Phosphatase 72  44 - 121 IU/L   AST 19 0 - 40 IU/L   ALT 12 0 - 32 IU/L  HIV antibody (with reflex)     Status: None   Collection Time: 01/09/21  4:28 PM   Result Value Ref Range   HIV Screen 4th Generation wRfx Non Reactive Non Reactive    Comment: HIV Negative HIV-1/HIV-2 antibodies and HIV-1 p24 antigen were NOT detected. There is no laboratory evidence of HIV infection.   RPR w/reflex to TrepSure     Status: None   Collection Time: 01/09/21  4:34 PM  Result Value Ref Range   RPR Non Reactive Non Reactive  T pallidum Antibody, EIA     Status: None   Collection Time: 01/09/21  4:34 PM  Result Value Ref Range   T pallidum Antibody, EIA Negative Negative    Comment: Interpretation: Positive: Consistent with syphilis infection (past or current). Equivocal: Low levels of antibody detected but insufficient to conclude syphilis (either past or current). If infection is suspected, retest in one month. Negative: Unconfirmed EIA. If patient is at risk, repeat testing in one month. This test is intended ONLY for specimens that have tested positive (reactive) or equivocal for Treponema pallidum antibodies prior to submission for testing. For the full CDC-recommended syphilis screening and diagnosis algorithm, [facility] offers test code 012005 RPR, Rfx Qn RPR/Confirm TP or 432987 T pallidum Screening [place].   Cervicovaginal ancillary only     Status: None   Collection Time: 03/06/21 10:12 AM  Result Value Ref Range   Neisseria Gonorrhea Negative    Chlamydia Negative    Comment Normal Reference Ranger Chlamydia - Negative    Comment      Normal Reference Range Neisseria Gonorrhea - Negative  Cervicovaginal ancillary only     Status: None   Collection Time: 03/06/21 10:12 AM  Result Value Ref Range   Neisseria Gonorrhea Negative    Chlamydia Negative    Comment Normal Reference Ranger Chlamydia - Negative    Comment      Normal Reference Range Neisseria Gonorrhea - Negative  POCT urine pregnancy     Status: None   Collection Time: 03/06/21 10:15 AM  Result Value Ref Range   Preg Test, Ur Negative Negative  POCT Wet Prep [facility]  [place])     Status: None   Collection Time: 03/06/21 10:34 AM  Result Value Ref Range   Source Wet Prep POC VAG    WBC, Wet Prep HPF POC NONE    Bacteria Wet Prep HPF POC Few Few   Clue Cells Wet Prep HPF POC None None   Clue Cells Wet Prep Whiff POC Negative Whiff    Yeast Wet Prep HPF POC None None   KOH Wet Prep POC None None   Trichomonas Wet Prep HPF POC Absent Absent  GC/Chlamydia probe amp ([place])not at [facility]     Status: None   Collection Time: 03/12/21  4:53 PM  Result Value Ref Range   Neisseria Gonorrhea Negative    Chlamydia Negative    Comment Normal Reference Ranger Chlamydia - Negative    Comment      Normal Reference Range Neisseria Gonorrhea - Negative  Cervicovaginal ancillary only     Status: Abnormal   Collection Time: 03/13/21  1:46 PM  Result Value Ref Range   Neisseria Gonorrhea Negative    Chlamydia Negative    Trichomonas Negative    Bacterial Vaginitis (gardnerella) Positive (A)    Candida Vaginitis Negative  Candida Glabrata Negative    Comment Normal Reference Range Candida Species - Negative    Comment Normal Reference Range Candida Galbrata - Negative    Comment Normal Reference Range Trichomonas - Negative    Comment Normal Reference Ranger Chlamydia - Negative    Comment      Normal Reference Range Neisseria Gonorrhea - Negative   Comment      Normal Reference Range Bacterial Vaginosis - Negative  RPR     Status: None   Collection Time: 03/13/21  1:56 PM  Result Value Ref Range   RPR Ser Ql Non Reactive Non Reactive  HIV antibody (with reflex)     Status: None   Collection Time: 03/13/21  1:56 PM  Result Value Ref Range   HIV Screen 4th Generation wRfx Non Reactive Non Reactive    Comment: HIV Negative HIV-1/HIV-2 antibodies and HIV-1 p24 antigen were NOT detected. There is no laboratory evidence of HIV infection.   Hemoglobin     Status: None   Collection Time: 03/13/21  2:00 PM  Result Value Ref Range   Hemoglobin 11.0 11 -  14.6 g/dL     Assessment and Plan :   PDMP not reviewed this encounter.  1. Bacterial vaginosis   2. Vaginal itching   3. Abnormal vaginal bleeding    I recommend the patient take the medications that were prescribed to her and follow-up with her gynecologist to obtain the Pap smear and HPV testing that she would like to have done.  Labs pending, advised her that we will notify her of results through [place].   Jaynee Eagles, [place] 03/17/21 1458

## 2021-03-17 NOTE — ED Triage Notes (Signed)
Pt presents with c/o vaginal itching and burning x 3 days.   States she wants HPV testing and STD .

## 2021-03-18 ENCOUNTER — Encounter: Payer: Self-pay | Admitting: Obstetrics & Gynecology

## 2021-03-18 LAB — CERVICOVAGINAL ANCILLARY ONLY
Bacterial Vaginitis (gardnerella): NEGATIVE
Candida Glabrata: NEGATIVE
Candida Vaginitis: POSITIVE — AB
Chlamydia: NEGATIVE
Comment: NEGATIVE
Comment: NEGATIVE
Comment: NEGATIVE
Comment: NEGATIVE
Comment: NEGATIVE
Comment: NORMAL
Neisseria Gonorrhea: NEGATIVE
Trichomonas: NEGATIVE

## 2021-03-20 ENCOUNTER — Other Ambulatory Visit: Payer: Self-pay

## 2021-03-20 ENCOUNTER — Encounter: Payer: Self-pay | Admitting: Obstetrics & Gynecology

## 2021-03-20 DIAGNOSIS — R102 Pelvic and perineal pain: Secondary | ICD-10-CM

## 2021-03-20 DIAGNOSIS — N946 Dysmenorrhea, unspecified: Secondary | ICD-10-CM

## 2021-03-24 ENCOUNTER — Other Ambulatory Visit: Payer: Self-pay

## 2021-03-24 ENCOUNTER — Ambulatory Visit (HOSPITAL_COMMUNITY)
Admission: RE | Admit: 2021-03-24 | Discharge: 2021-03-24 | Disposition: A | Discharge status: Home / Self Care | Payer: Commercial Managed Care - HMO | Source: Ambulatory Visit | Attending: Obstetrics & Gynecology | Admitting: Obstetrics & Gynecology

## 2021-03-24 ENCOUNTER — Other Ambulatory Visit: Payer: Self-pay | Admitting: Obstetrics & Gynecology

## 2021-03-24 DIAGNOSIS — N946 Dysmenorrhea, unspecified: Secondary | ICD-10-CM | POA: Insufficient documentation

## 2021-03-24 DIAGNOSIS — R102 Pelvic and perineal pain: Secondary | ICD-10-CM | POA: Diagnosis present

## 2021-03-26 ENCOUNTER — Encounter: Payer: Self-pay | Admitting: Obstetrics & Gynecology

## 2021-03-26 ENCOUNTER — Other Ambulatory Visit (HOSPITAL_COMMUNITY)
Admission: RE | Admit: 2021-03-26 | Discharge: 2021-03-26 | Disposition: A | Discharge status: Home / Self Care | Payer: Commercial Managed Care - HMO | Source: Ambulatory Visit | Attending: Obstetrics & Gynecology | Admitting: Obstetrics & Gynecology

## 2021-03-26 ENCOUNTER — Ambulatory Visit: Payer: Managed Care, Other (non HMO) | Admitting: Obstetrics & Gynecology

## 2021-03-26 ENCOUNTER — Other Ambulatory Visit: Payer: Self-pay

## 2021-03-26 VITALS — BP 103/63 | HR 63 | Wt 171.0 lb

## 2021-03-26 DIAGNOSIS — N939 Abnormal uterine and vaginal bleeding, unspecified: Secondary | ICD-10-CM

## 2021-03-26 DIAGNOSIS — Z01812 Encounter for preprocedural laboratory examination: Secondary | ICD-10-CM | POA: Diagnosis not present

## 2021-03-26 LAB — POCT URINE PREGNANCY: Preg Test, Ur: NEGATIVE

## 2021-03-26 MED ORDER — MEGESTROL ACETATE 40 MG PO TABS: 40.0000 mg | ORAL_TABLET | Freq: Every day | ORAL | 2 refills | Status: DC

## 2021-03-26 NOTE — Patient Instructions (Signed)
Endometrial Biopsy An endometrial biopsy is a procedure to remove tissue samples from the endometrium, which is the lining of the uterus. The tissue that is removed can then be checked under a microscope for disease. This procedure is used to diagnose conditions such as endometrial cancer, endometrial tuberculosis, polyps, or other inflammatory conditions. This procedure may also be used to investigate uterine bleeding to determine where you are in your menstrual cycle or how your hormone levels are affecting the lining of the uterus. Tell a health care provider about: Any allergies you have. All medicines you are taking, including vitamins, herbs, eye drops, creams, and over-the-counter medicines. Any problems you or family members have had with anesthetic medicines. Any blood disorders you have. Any surgeries you have had. Any medical conditions you have. Whether you are pregnant or may be pregnant. What are the risks? Generally, this is a safe procedure. However, problems may occur, including: Bleeding. Pelvic infection. Puncture of the wall of the uterus with the biopsy device (rare). Allergic reactions to medicines. What happens before the procedure? Keep a record of your menstrual cycles as told by your health care provider. You may need to schedule your procedure for a specific time in your cycle. You may want to bring a sanitary pad to wear after the procedure. Plan to have someone take you home from the hospital or clinic. Ask your health care provider about: Changing or stopping your regular medicines. This is especially important if you are taking diabetes medicines, arthritis medicines, or blood thinners. Taking medicines such as aspirin and ibuprofen. These medicines can thin your blood. Do not take these medicines unless your health care provider tells you to take them. Taking over-the-counter medicines, vitamins, herbs, and supplements. What happens during the procedure? You  will lie on an exam table with your feet and legs supported as in a pelvic exam. Your health care provider will insert an instrument (speculum) into your vagina to see your cervix. Your cervix will be cleansed with an antiseptic solution. A medicine (local anesthetic) will be used to numb the cervix. A forceps instrument (tenaculum) will be used to hold your cervix steady for the biopsy. A thin, rod-like instrument (uterine sound) will be inserted through your cervix to determine the length of your uterus and the location where the biopsy sample will be removed. A thin, flexible tube (catheter) will be inserted through your cervix and into the uterus. The catheter will be used to collect the biopsy sample from your endometrial tissue. The catheter and speculum will then be removed, and the tissue sample will be sent to a lab for examination. The procedure may vary among health care providers and hospitals. What can I expect after procedure? You will rest in a recovery area until you are ready to go home. You may have mild cramping and a small amount of vaginal bleeding. This is normal. You may have a small amount of vaginal bleeding for a few days. This is normal. It is up to you to get the results of your procedure. Ask your health care provider, or the department that is doing the procedure, when your results will be ready. Follow these instructions at home: Take over-the-counter and prescription medicines only as told by your health care provider. Do not douche, use tampons, or have sexual intercourse until your health care provider approves. Return to your normal activities as told by your health care provider. Ask your health care provider what activities are safe for you. Follow instructions  from your health care provider about any activity restrictions, such as restrictions on strenuous exercise or heavy lifting. Keep all follow-up visits. This is important. Contact a health care  provider: You have heavy bleeding, or bleed for longer than 2 days after the procedure. You have bad smelling discharge from your vagina. You have a fever or chills. You have a burning sensation when urinating or you have difficulty urinating. You have severe pain in your lower abdomen. Get help right away if you: You have severe cramps in your stomach or back. You pass large blood clots. Your bleeding increases. You become weak or light-headed, or you faint or lose consciousness. Summary An endometrial biopsy is a procedure to remove tissue samples is taken from the endometrium, which is the lining of the uterus. The tissue sample that is removed will be checked under a microscope for disease. This procedure is used to diagnose conditions such as endometrial cancer, endometrial tuberculosis, polyps, or other inflammatory conditions. After the procedure, it is common to have mild cramping and a small amount of vaginal bleeding for a few days. Do not douche, use tampons, or have sexual intercourse until your health care provider approves. Ask your health care provider which activities are safe for you. This information is not intended to replace advice given to you by your health care provider. Make sure you discuss any questions you have with your health care provider. Document Revised: 10/09/2020 Document Reviewed: 08/21/2019 Elsevier Patient Education  2022 [place] POST-PROCEDURE INSTRUCTIONS  You may take Ibuprofen, Aleve or Tylenol for pain if needed.  Cramping should resolve within in 24 hours.  You may have a small amount of spotting.  You should wear a mini pad for the next few days.  You may have intercourse after 24 hours.  You need to call if you have any pelvic pain, fever, heavy bleeding or foul smelling vaginal discharge.  Shower or bathe as normal  6. We will call you within one week with results or we will discuss   the results at your  follow-up appointment if needed.

## 2021-03-26 NOTE — Progress Notes (Signed)
History:  41 y.o. G0P0 here today for uterien bleedign for 1 month. Pt is s/p [place] which shows submucosal fibroids. She is here for endo bx  The following portions of the patient's history were reviewed and updated as appropriate: allergies, current medications, past family history, past medical history, past social history, past surgical history and problem list.  Review of Systems:  Pertinent items are noted in HPI.    Objective:  Physical Exam Blood pressure 103/63, pulse 63, weight 171 lb (77.6 kg), last menstrual period 03/05/2021.  CONSTITUTIONAL: Well-developed, well-nourished female in no acute distress.  HENT:  Normocephalic, atraumatic EYES: Conjunctivae and EOM are normal. No scleral icterus.  NECK: Normal range of motion SKIN: Skin is warm and dry. No rash noted. Not diaphoretic.No pallor. [place]: Alert and oriented to person, place, and time. Normal coordination.  Abd: Soft, nontender and nondistended Pelvic: Normal appearing external genitalia; normal appearing vaginal mucosa and cervix.  Normal discharge.  Small uterus, no other palpable masses, no uterine or adnexal tenderness  GYN procedure: The indications for endometrial biopsy were reviewed.   Risks of the biopsy including cramping, bleeding, infection, uterine perforation, inadequate specimen and need for additional procedures  were discussed. The patient states she understands and agrees to undergo procedure today. Consent was signed. Time out was performed. Urine HCG was negative. A sterile speculum was placed in the patient's vagina and the cervix was prepped with Betadine. A single-toothed tenaculum was placed on the anterior lip of the cervix to stabilize it. The 3 mm pipelle was introduced into the endometrial cavity without difficulty to a depth of 7cm, and a moderate amount of tissue was obtained and sent to pathology. The instruments were removed from the patient's vagina. Minimal bleeding from the cervix was  noted. The patient tolerated the procedure well.   Labs and Imaging US PELVIC COMPLETE WITH TRANSVAGINAL  Result Date: 03/24/2021 CLINICAL DATA:  Pelvic neck pain, dysmenorrhea, abnormal uterine bleeding, history of hydrosalpinx and uterine leiomyoma, prior RIGHT salpingo oophorectomy EXAM: TRANSABDOMINAL AND TRANSVAGINAL ULTRASOUND OF PELVIS TECHNIQUE: Both transabdominal and transvaginal ultrasound examinations of the pelvis were performed. Transabdominal technique was performed for global imaging of the pelvis including uterus, ovaries, adnexal regions, and pelvic cul-de-sac. It was necessary to proceed with endovaginal exam following the transabdominal exam to visualize the endometrium and LEFT ovary. COMPARISON:  11/21/2020 FINDINGS: Uterus Measurements: 8.6 x 3.9 x 5.6 cm = volume: 99 mL. Anteverted. Heterogeneous myometrium. Submucosal leiomyoma anterior upper uterus 2.0 x 1.9 x 1.9 cm. Additional intramural leiomyoma LEFT uterus 2.2 x 2.3 x 2.2 cm. Endometrium Thickness: 6 mm.  No endometrial fluid or mass Right ovary Surgically absent Left ovary Measurements: 2.7 x 1.6 x 1.9 cm = volume: 4.3 mL. Normal morphology without mass Other findings No free pelvic fluid.  No adnexal masses. IMPRESSION: Two small uterine leiomyomata, one of which extend submucosal. Unremarkable endometrial complex and LEFT ovary. Surgical absence of RIGHT ovary. Electronically Signed   By: Lavonia Dana M.D.   On: 03/24/2021 12:44    Assessment & Plan:  Diagnoses and all orders for this visit:  Abnormal uterine bleeding (AUB) -     POCT urine pregnancy -     Surgical pathology -     megestrol (MEGACE) 40 MG tablet; Take 1 tablet (40 mg total) by mouth daily.   Routine post-procedure instructions were given to the patient. The patient will follow up via MyChart to review the results and for further management.  F/u in 3 months or sooner prn   Glendon Dunwoody L. Harraway-Smith, M.D., [place] June

## 2021-03-27 ENCOUNTER — Encounter: Payer: Self-pay | Admitting: Obstetrics & Gynecology

## 2021-03-28 LAB — SURGICAL PATHOLOGY

## 2021-03-31 ENCOUNTER — Encounter: Payer: Self-pay | Admitting: General Practice

## 2021-04-06 ENCOUNTER — Encounter: Payer: Self-pay | Admitting: Obstetrics & Gynecology

## 2021-04-07 ENCOUNTER — Encounter (HOSPITAL_COMMUNITY): Payer: Self-pay

## 2021-04-07 ENCOUNTER — Other Ambulatory Visit: Payer: Self-pay

## 2021-04-07 ENCOUNTER — Encounter: Payer: Self-pay | Admitting: Obstetrics & Gynecology

## 2021-04-07 ENCOUNTER — Ambulatory Visit (HOSPITAL_COMMUNITY)
Admission: EM | Admit: 2021-04-07 | Discharge: 2021-04-07 | Disposition: A | Discharge status: Home / Self Care | Payer: Commercial Managed Care - HMO | Attending: Family Medicine | Admitting: Family Medicine

## 2021-04-07 DIAGNOSIS — L02411 Cutaneous abscess of right axilla: Secondary | ICD-10-CM | POA: Diagnosis not present

## 2021-04-07 MED ORDER — AMOXICILLIN-POT CLAVULANATE 875-125 MG PO TABS: 1.0000 | ORAL_TABLET | Freq: Two times a day (BID) | ORAL | 0 refills | Status: DC

## 2021-04-07 MED ORDER — HYDROCODONE-ACETAMINOPHEN 5-325 MG PO TABS
2.0000 | ORAL_TABLET | ORAL | 0 refills | Status: DC | PRN
Start: 2021-04-07 — End: 2021-05-02

## 2021-04-07 MED ORDER — LIDOCAINE-EPINEPHRINE 1 %-1:100000 IJ SOLN
INTRAMUSCULAR | Status: AC
  Filled 2021-04-07: qty 1

## 2021-04-07 NOTE — Discharge Instructions (Addendum)
You were seen today for abscess of the axilla.  We opened and drained this today.  I have sent this for culture.  I have sent out an antibiotic to take twice/day and pain medication.  Please keep the area covered, and you may use a hot compress.  Do not apply anything else to the area at this time.  Please follow up if having further issues.

## 2021-04-07 NOTE — ED Provider Notes (Signed)
[facility]    CSN: [id number] Arrival date & time: 04/07/21  0802      History   Chief Complaint Chief Complaint  Patient presents with   Abscess    HPI [name] is a 41 y.o. female.   Patient is here for an abscess in her right arm pit.  Has been there x 4 days.  Painful, sore.  She has had these in the past.  Very painful to move her arm, unable to work due to pain.   Past Medical History:  Diagnosis Date   Endometriosis    History of 2019 novel coronavirus disease (COVID-19)    pt tested positive 03-03-2019, results in care everywhere.  (05-22-2019 per pt had symptoms of fatigue, headache, and loss of taste/ smell;  fatigue and headache after one week and loss taste/ smell resolved after 3 wks)   Infertility, female    Ovarian cyst    Pelvic mass in female    large fluid filled pelvic mass   Recurrent boils    Septate uterus    Trichomoniasis     Patient Active Problem List   Diagnosis Date Noted   Exposure to sexually transmitted disease (STD) 03/06/2021   Molluscum contagiosum 12/19/2020   Abdominal pain 08/16/2020   Constipation 08/16/2020   Frequent urination 06/24/2020   Hydradenitis 06/24/2020   Ganglion cyst of left foot 06/13/2020   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   Menorrhagia 12/25/2019   Vaginal discharge 07/08/2019   GERD (gastroesophageal reflux disease) 06/07/2019   HSV-2 seropositive 03/07/2019   Hydrosalpinx 12/30/2018   Endometriosis 05/01/2011   SEPTATE UTERUS 07/24/2009   TOBACCO ABUSE 09/26/2008   Female infertility 09/26/2008   Menometrorrhagia 09/21/2006    Past Surgical History:  Procedure Laterality Date   BREAST BIOPSY Right 1999   per pt benign    LAPAROSCOPIC SALPINGOOPHERECTOMY Right 03-25-2010   @[facility]    and LYSIS ADHESIONS   LAPAROSCOPY N/A 05/30/2019   Procedure: DIAGNOSTIC LAPAROSCOPY;  Surgeon: Woodroe Mode, MD;  Location: [facility];  Service: Gynecology;  Laterality: N/A;    OB History      Gravida  0   Para      Term      Preterm      AB      Living  0      SAB      IAB      Ectopic      Multiple      Live Births               Home Medications    Prior to Admission medications   Medication Sig Start Date End Date Taking? Authorizing Provider  megestrol (MEGACE) 40 MG tablet Take 1 tablet (40 mg total) by mouth daily. 03/26/21   Lavonia Drafts, MD  tiZANidine (ZANAFLEX) 2 MG tablet Take 1 tablet (2 mg total) by mouth every 8 (eight) hours as needed for muscle spasms. Patient not taking: Reported on 03/12/2021 01/30/21   Hazel Sams, PA-C  cetirizine (ZYRTEC ALLERGY) 10 MG tablet Take 1 tablet (10 mg total) by mouth daily. Patient not taking: Reported on 03/21/2020 10/02/19 05/28/20  Jaynee Eagles, PA-C  Norethindrone Acetate-Ethinyl Estrad-FE (LOESTRIN 24 FE) 1-20 MG-MCG(24) tablet Take 1 tablet by mouth daily. 03/21/20 05/28/20  Lavonia Drafts, MD  Norgestimate-Ethinyl Estradiol Triphasic (TRI-SPRINTEC) 0.18/0.215/0.25 MG-35 MCG tablet Take 1 tablet by mouth daily. 11/03/10 05/01/11  Woodroe Mode, MD    Family History Family History  Problem Relation Age of Onset   Hypertension Mother    Diabetes Mother    Cancer Father    Hypertension Father    Diabetes Brother    Hypertension Brother    Breast cancer Neg Hx     Social History Social History   Tobacco Use   Smoking status: Every Day    Packs/day: 0.25    Years: 15.00    Pack years: 3.75    Types: Cigarettes   Smokeless tobacco: Never  Vaping Use   Vaping Use: Never used  Substance Use Topics   Alcohol use: Yes    Comment: occasionally   Drug use: Never     Allergies   Sulfa antibiotics   Review of Systems Review of Systems  Constitutional: Negative.   HENT: Negative.    Respiratory: Negative.    Cardiovascular: Negative.   Gastrointestinal: Negative.   Skin:  Positive for wound.    Physical Exam Triage Vital Signs ED Triage Vitals  Enc Vitals  Group     BP 04/07/21 0815 120/73     Pulse Rate 04/07/21 0815 80     Resp 04/07/21 0815 18     Temp 04/07/21 0815 98.5 F (36.9 C)     Temp Source 04/07/21 0815 Oral     SpO2 04/07/21 0815 100 %     Weight --      Height --      Head Circumference --      Peak Flow --      Pain Score 04/07/21 0813 10     Pain Loc --      Pain Edu? --      Excl. in [place]? --    No data found.  Updated Vital Signs BP 120/73 (BP Location: Left Arm)    Pulse 80    Temp 98.5 F (36.9 C) (Oral)    Resp 18    [name] 02/23/2021 (Approximate)    SpO2 100%   Visual Acuity Right Eye Distance:   Left Eye Distance:   Bilateral Distance:    Right Eye Near:   Left Eye Near:    Bilateral Near:     Physical Exam Constitutional:      Appearance: Normal appearance.  Skin:    Comments: At the right axilla is a large, very tender area of fullness, redness;  she is unable to fully lift the right arm due to pain;   Neurological:     Mental Status: She is alert.     UC Treatments / Results  Labs (all labs ordered are listed, but only abnormal results are displayed) Labs Reviewed - No data to display  EKG   Radiology No results found.  Procedures Incision and Drainage  Date/Time: 04/07/2021 9:46 AM Performed by: Rondel Oh, MD Authorized by: Rondel Oh, MD   Consent:    Consent obtained:  Verbal   Consent given by:  Patient   Risks, benefits, and alternatives were discussed: yes     Risks discussed:  Bleeding, incomplete drainage and pain   Alternatives discussed:  Alternative treatment Universal protocol:    Procedure explained and questions answered to patient or proxy's satisfaction: yes   Location:    Type:  Abscess   Location: right axilla. Pre-procedure details:    Skin preparation:  Chlorhexidine with alcohol Sedation:    Sedation type:  None Anesthesia:    Anesthesia method:  Local infiltration   Local anesthetic:  Lidocaine 1% WITH epi Procedure type:  Complexity:   Simple Procedure details:    Ultrasound guidance: no     Needle aspiration: no     Incision types:  Single straight   Incision depth:  Dermal   Drainage:  Bloody and serosanguinous   Drainage amount:  Copious   Wound treatment:  Wound left open   Packing materials:  None Post-procedure details:    Procedure completion:  Tolerated  Medications Ordered in UC Medications - No data to display  Initial Impression / Assessment and Plan / UC Course  I have reviewed the triage vital signs and the nursing notes.  Pertinent labs & imaging results that were available during my care of the patient were reviewed by me and considered in my medical decision making (see chart for details).  Patient was seen today for large abscess of right axilla.  Discussed that given the size and level of pain she was in the ER may be the best option, but she declined and opted for I&D today.  This was done, with a large amount of fluid expressed and sent for culture.  This was very painful for the patient, but she felt somewhat better after procedure.  She is allergy to bactrim listed, so augmentin was sent to pharmacy, along with several days of pain medication.  Advised that if this worsens despite treatment, she should go to the ER for evaluation.    Final Clinical Impressions(s) / UC Diagnoses   Final diagnoses:  Abscess of axilla, right     Discharge Instructions      You were seen today for abscess of the axilla.  We opened and drained this today.  I have sent this for culture.  I have sent out an antibiotic to take twice/day and pain medication.  Please keep the area covered, and you may use a hot compress.  Do not apply anything else to the area at this time.  Please follow up if having further issues.     ED Prescriptions     Medication Sig Dispense Auth. Provider   amoxicillin-clavulanate (AUGMENTIN) 875-125 MG tablet Take 1 tablet by mouth every 12 (twelve) hours. 14 tablet Zykia Walla, MD    HYDROcodone-acetaminophen (NORCO/VICODIN) 5-325 MG tablet Take 2 tablets by mouth every 4 (four) hours as needed. 10 tablet Rondel Oh, MD      PDMP not reviewed this encounter.   Rondel Oh, MD 04/07/21 [phone]

## 2021-04-07 NOTE — ED Triage Notes (Signed)
Pt c/o abscess to right axillary region.  Started: 3 days ago

## 2021-04-10 ENCOUNTER — Encounter: Payer: Self-pay | Admitting: Obstetrics & Gynecology

## 2021-04-11 LAB — AEROBIC CULTURE W GRAM STAIN (SUPERFICIAL SPECIMEN): Gram Stain: NONE SEEN

## 2021-04-14 ENCOUNTER — Encounter: Payer: Self-pay | Admitting: Obstetrics & Gynecology

## 2021-05-02 ENCOUNTER — Other Ambulatory Visit: Payer: Self-pay

## 2021-05-02 ENCOUNTER — Encounter (HOSPITAL_COMMUNITY): Payer: Self-pay

## 2021-05-02 ENCOUNTER — Ambulatory Visit (HOSPITAL_COMMUNITY)
Admission: EM | Admit: 2021-05-02 | Discharge: 2021-05-02 | Disposition: A | Discharge status: Home / Self Care | Payer: Managed Care, Other (non HMO) | Attending: Urgent Care | Admitting: Urgent Care

## 2021-05-02 DIAGNOSIS — M25511 Pain in right shoulder: Secondary | ICD-10-CM | POA: Diagnosis not present

## 2021-05-02 DIAGNOSIS — M7918 Myalgia, other site: Secondary | ICD-10-CM | POA: Diagnosis not present

## 2021-05-02 MED ORDER — DEXAMETHASONE SODIUM PHOSPHATE 10 MG/ML IJ SOLN
10.0000 mg | Freq: Once | INTRAMUSCULAR | Status: AC
  Administered 2021-05-02: 10 mg via INTRAMUSCULAR

## 2021-05-02 MED ORDER — DICLOFENAC SODIUM 75 MG PO TBEC: 75.0000 mg | DELAYED_RELEASE_TABLET | Freq: Two times a day (BID) | ORAL | 0 refills | Status: DC

## 2021-05-02 MED ORDER — TIZANIDINE HCL 4 MG PO TABS: 4.0000 mg | ORAL_TABLET | Freq: Four times a day (QID) | ORAL | 0 refills | Status: DC | PRN

## 2021-05-02 MED ORDER — DEXAMETHASONE SODIUM PHOSPHATE 10 MG/ML IJ SOLN
INTRAMUSCULAR | Status: AC
  Filled 2021-05-02: qty 1

## 2021-05-02 NOTE — ED Triage Notes (Signed)
Pt c/o of left shoulder pain x 2 days.  ?

## 2021-05-02 NOTE — Discharge Instructions (Addendum)
Your pain appears to be related to overuse. ?Your deltoid, trapezius and bicep muscle are tender and knotted. This is called myofascial pain. ?You were given a dexamethasone shot in our office. ?Please start taking the anti-inflammatory tomorrow (diclofenac). You can take it twice daily with food, do not take it past Sunday unless cleared by surgeon.  ?You can start taking the muscle relaxer today. This medication could possibly make you feel drowsy. ?Please continue moist heat to your R shoulder, several times daily. ?Avoid lifting >15# for the next 2-3 days. ?Follow up with sports medicine or possibly physical therapy should your symptoms persist. ?Should you develop inability to move shoulder or neck, numbness, fever, or headache, head to ER. ?

## 2021-05-02 NOTE — ED Provider Notes (Signed)
?[place] [facility] ? ? ? ?CSN: [id number] ?Arrival date & time: 05/02/21  0800 ? ? ?  ? ?History   ?Chief Complaint ?No chief complaint on file. ? ? ?HPI ?[name] is a 41 y.o. female.  ? ?Pleasant 41 year old female presents today with a 2-week history of right shoulder pain.  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  She denies any particular injury but states daily use of her shoulder has aggravated it.  Over the past 2 days however, she feels the pain has increased substantially.  She notes the pain is in a generalized area to the lateral shoulder, around her deltoid and bicep primarily.  She does note some discomfort to her trap on the right as well.  She denies any left shoulder pain.  There has been no injury.  She denies any radicular symptoms, and has no burning.  She describes the pain as sharp, worse with movement.  She has tried Advil, Tylenol, ice and heat without relief.  She states she has a surgery scheduled for next week on Thursday to remove a bleeding polyp in her uterus, and her only current medication is Megace.  She denies any chronic musculoskeletal issues.  She denies any headache, decreased range of motion of her neck, fever, or any additional symptoms. ? ? ? ?Past Medical History:  ?Diagnosis Date  ? Endometriosis   ? History of 2019 novel coronavirus disease (COVID-19)   ? pt tested positive 03-03-2019, results in care everywhere.  (05-22-2019 per pt had symptoms of fatigue, headache, and loss of taste/ smell;  fatigue and headache after one week and loss taste/ smell resolved after 3 wks)  ? Infertility, female   ? Ovarian cyst   ? Pelvic mass in female   ? large fluid filled pelvic mass  ? Recurrent boils   ? Septate uterus   ? Trichomoniasis   ? ? ?Patient Active Problem List  ? Diagnosis Date Noted  ? Exposure to sexually transmitted disease (STD) 03/06/2021  ? Molluscum contagiosum 12/19/2020  ? Abdominal pain 08/16/2020  ?  Constipation 08/16/2020  ? Frequent urination 06/24/2020  ? Hydradenitis 06/24/2020  ? Ganglion cyst of left foot 06/13/2020  ? 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  ? Menorrhagia 12/25/2019  ? Vaginal discharge 07/08/2019  ? GERD (gastroesophageal reflux disease) 06/07/2019  ? HSV-2 seropositive 03/07/2019  ? Hydrosalpinx 12/30/2018  ? Endometriosis 05/01/2011  ? SEPTATE UTERUS 07/24/2009  ? TOBACCO ABUSE 09/26/2008  ? Female infertility 09/26/2008  ? Menometrorrhagia 09/21/2006  ? ? ?Past Surgical History:  ?Procedure Laterality Date  ? BREAST BIOPSY Right 1999  ? per pt benign   ? LAPAROSCOPIC SALPINGOOPHERECTOMY Right 03-25-2010   '@[facility]   ? and LYSIS ADHESIONS  ? LAPAROSCOPY N/A 05/30/2019  ? Procedure: DIAGNOSTIC LAPAROSCOPY;  Surgeon: Woodroe Mode, MD;  Location: [facility];  Service: Gynecology;  Laterality: N/A;  ? ? ?OB History   ? ? Gravida  ?0  ? Para  ?   ? Term  ?   ? Preterm  ?   ? AB  ?   ? Living  ?0  ?  ? ? SAB  ?   ? IAB  ?   ? Ectopic  ?   ? Multiple  ?   ? Live Births  ?   ?   ?  ?  ? ? ? ?Home Medications   ? ?Prior to Admission medications   ?Medication Sig Start  Date End Date Taking? Authorizing Provider  ?diclofenac (VOLTAREN) 75 MG EC tablet Take 1 tablet (75 mg total) by mouth 2 (two) times daily. 05/02/21  Yes Jayland Null L, PA  ?tiZANidine (ZANAFLEX) 4 MG tablet Take 1 tablet (4 mg total) by mouth every 6 (six) hours as needed for muscle spasms. 05/02/21  Yes Jet Traynham L, PA  ?megestrol (MEGACE) 40 MG tablet Take 1 tablet (40 mg total) by mouth daily. 03/26/21   Lavonia Drafts, MD  ?cetirizine (ZYRTEC ALLERGY) 10 MG tablet Take 1 tablet (10 mg total) by mouth daily. ?Patient not taking: Reported on 03/21/2020 10/02/19 05/28/20  Jaynee Eagles, PA-C  ?Norethindrone Acetate-Ethinyl Estrad-FE (LOESTRIN 24 FE) 1-20 MG-MCG(24) tablet Take 1 tablet by mouth daily. 03/21/20 05/28/20  Lavonia Drafts, MD  ?Norgestimate-Ethinyl Estradiol Triphasic (TRI-SPRINTEC) 0.18/0.215/0.25  MG-35 MCG tablet Take 1 tablet by mouth daily. 11/03/10 05/01/11  Woodroe Mode, MD  ? ? ?Family History ?Family History  ?Problem Relation Age of Onset  ? Hypertension Mother   ? Diabetes Mother   ? Cancer Father   ? Hypertension Father   ? Diabetes Brother   ? Hypertension Brother   ? Breast cancer Neg Hx   ? ? ?Social History ?Social History  ? ?Tobacco Use  ? Smoking status: Every Day  ?  Packs/day: 0.25  ?  Years: 15.00  ?  Pack years: 3.75  ?  Types: Cigarettes  ? Smokeless tobacco: Never  ?Vaping Use  ? Vaping Use: Never used  ?Substance Use Topics  ? Alcohol use: Yes  ?  Comment: occasionally  ? Drug use: Never  ? ? ? ?Allergies   ?Sulfa antibiotics ? ? ?Review of Systems ?Review of Systems  ?Genitourinary:  Positive for menstrual problem.  ?Musculoskeletal:  Positive for myalgias (R shoulder pain).  ?All other systems reviewed and are negative. ? ? ?Physical Exam ?Triage Vital Signs ?ED Triage Vitals [05/02/21 0810]  ?Enc Vitals Group  ?   BP 104/68  ?   Pulse Rate 98  ?   Resp 16  ?   Temp 99.4 ?F (37.4 ?C)  ?   Temp Source Oral  ?   SpO2 97 %  ?   Weight   ?   Height   ?   Head Circumference   ?   Peak Flow   ?   Pain Score   ?   Pain Loc   ?   Pain Edu?   ?   Excl. in [place]?   ? ?No data found. ? ?Updated Vital Signs ?BP 104/68 (BP Location: Left Arm)   Pulse 98   Temp 99.4 ?F (37.4 ?C) (Oral)   Resp 16   SpO2 97%  ? ?Visual Acuity ?Right Eye Distance:   ?Left Eye Distance:   ?Bilateral Distance:   ? ?Right Eye Near:   ?Left Eye Near:    ?Bilateral Near:    ? ?Physical Exam ?Vitals and nursing note reviewed.  ?Constitutional:   ?   General: She is not in acute distress. ?   Appearance: Normal appearance. She is normal weight. She is not ill-appearing, toxic-appearing or diaphoretic.  ?HENT:  ?   Head: Normocephalic and atraumatic.  ?Musculoskeletal:  ?   Right shoulder: Tenderness (R trap, deltoid and bicep) present. No swelling, deformity, effusion, laceration, bony tenderness or crepitus. Normal range  of motion. Normal strength. Normal pulse.  ?   Left shoulder: Normal.  ?   Right upper arm: Normal.  ?  Left upper arm: Normal.  ?   Right elbow: Normal.  ?   Left elbow: Normal.  ?   Right forearm: Normal.  ?   Left forearm: Normal.  ?   Right wrist: Normal. Normal range of motion. Normal pulse.  ?   Left wrist: Normal. Normal range of motion. Normal pulse.  ?   Right hand: Normal. No swelling or tenderness. Normal range of motion. Normal strength. Normal pulse.  ?   Left hand: Normal. No swelling or tenderness. Normal range of motion. Normal strength. Normal pulse.  ?   Cervical back: Tenderness (R trap - knotted and tense) present. No swelling, edema, deformity, erythema, lacerations, rigidity, torticollis, bony tenderness or crepitus. No pain with movement. Normal range of motion.  ?Neurological:  ?   Mental Status: She is alert.  ? ? ? ?UC Treatments / Results  ?Labs ?(all labs ordered are listed, but only abnormal results are displayed) ?Labs Reviewed - No data to display ? ?EKG ? ? ?Radiology ?No results found. ? ?Procedures ?Procedures (including critical care time) ? ?Medications Ordered in UC ?Medications  ?dexamethasone (DECADRON) injection 10 mg (10 mg Intramuscular Given 05/02/21 0849)  ? ? ?Initial Impression / Assessment and Plan / UC Course  ?I have reviewed the triage vital signs and the nursing notes. ? ?Pertinent labs & imaging results that were available during my care of the patient were reviewed by me and considered in my medical decision making (see chart for details). ? ?  ? ?Myofascial pain - likely from overuse. Pt requesting steroid shot in office. Had this in the past for a wrist issue and stated it worked well with no adverse side effects. Pt also not wanting to take many PO meds due to upcoming surgery. ?Acute R shoulder pain - triage and nursing notes state left shoulder, however pt denies L shoulder pain, all sx in R shoulder. No red flag symptoms, appears to be muscular. Continue  moist heat, avoid heavy lifting x 2-3 days, short course of diclofenac (do not take past Sunday). Muscle relaxer PRN, may cause drowsiness. ? ?Final Clinical Impressions(s) / UC Diagnoses  ? ?Final diagnoses:  ?

## 2021-05-06 IMAGING — US US TRANSVAGINAL NON-OB
1 series · 13 of 25 positions shown · non-contrast
Comparison: Pelvic ultrasound 12/29/2018, MR pelvis 04/11/2019

CLINICAL DATA: Pelvic pain in a female, history endometriosis,
prior RIGHT oophorectomy, septate uterus, RIGHT pelvic mass on prior
MR

EXAM:
TRANSABDOMINAL AND TRANSVAGINAL ULTRASOUND OF PELVIS
TECHNIQUE: Both transabdominal and transvaginal ultrasound examinations of the
pelvis were performed. Transabdominal technique was performed for
global imaging of the pelvis including uterus, ovaries, adnexal
regions, and pelvic cul-de-sac. It was necessary to proceed with
endovaginal exam following the transabdominal exam to visualize the
endometrium and adnexa.

[Series 1: us transvaginal non-ob · 13 of 95 slices shown]
[im 1/95]
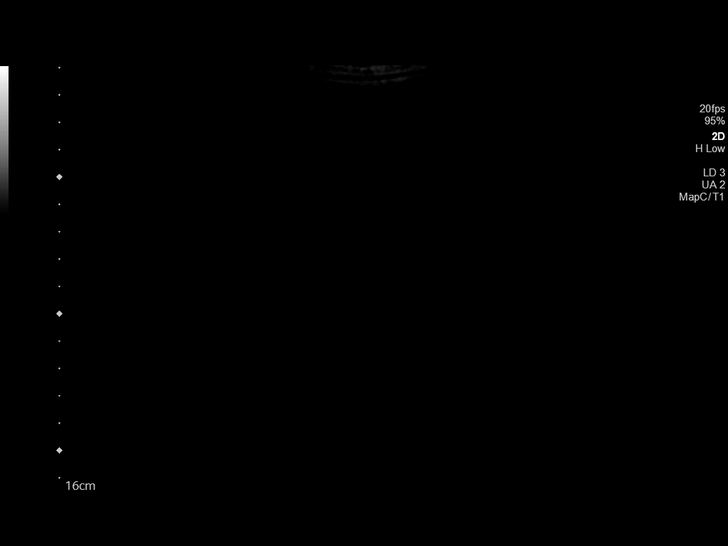
[im 8/95]
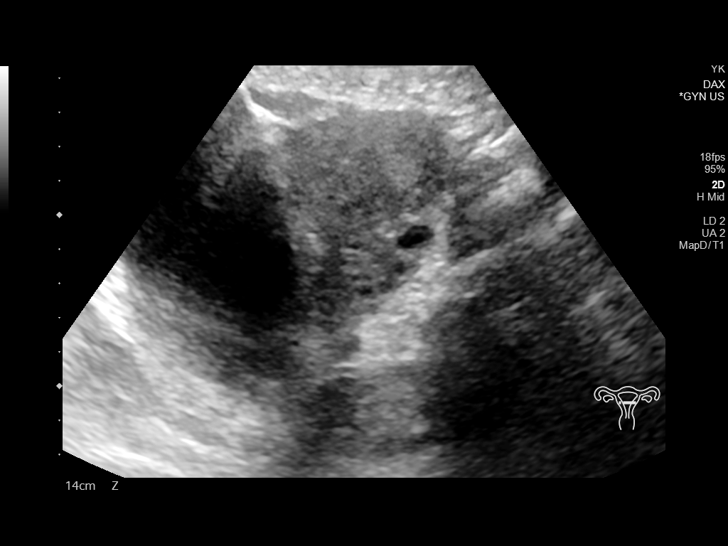
[im 16/95]
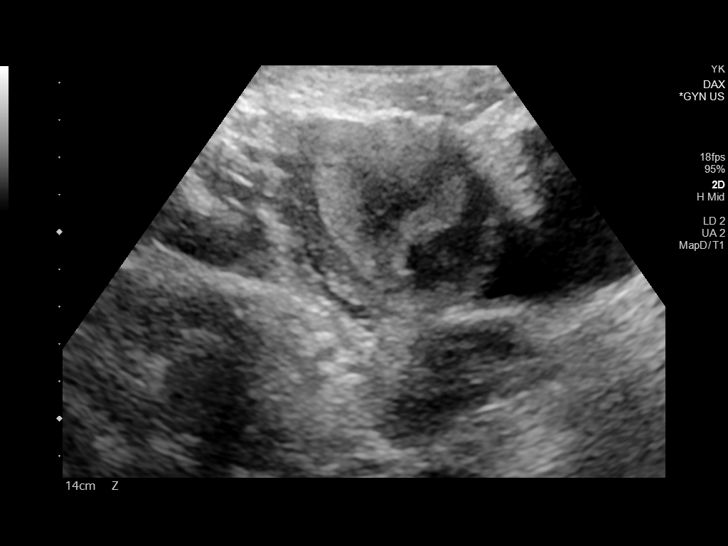
[im 24/95]
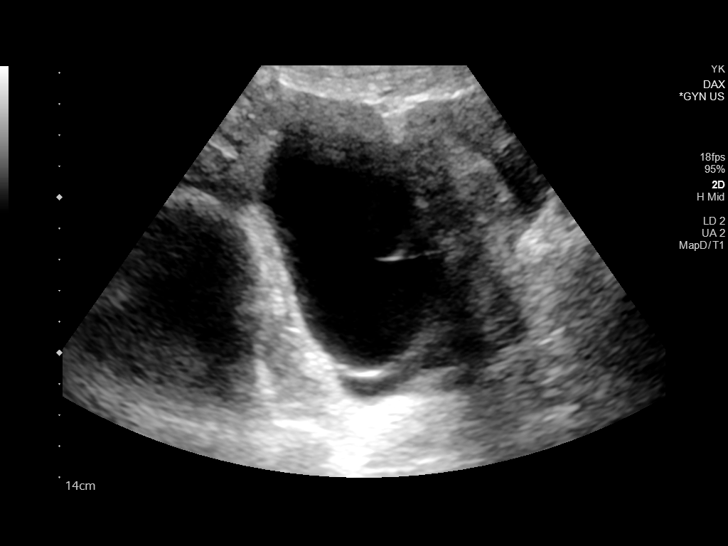
[im 32/95]
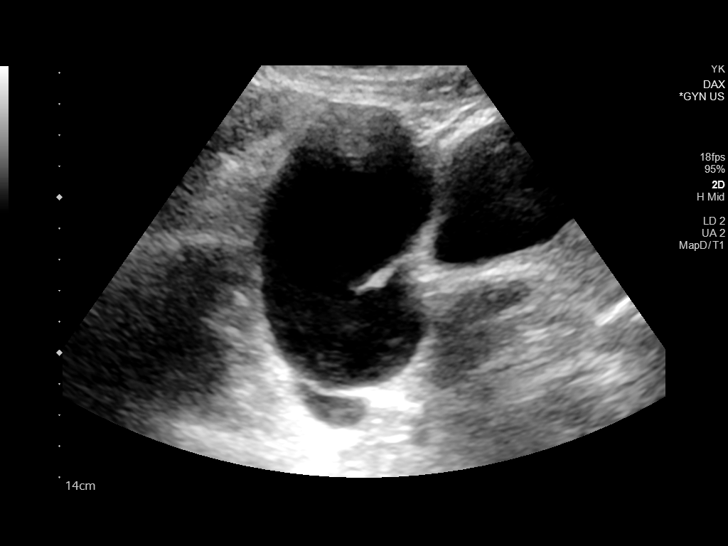
[im 40/95]
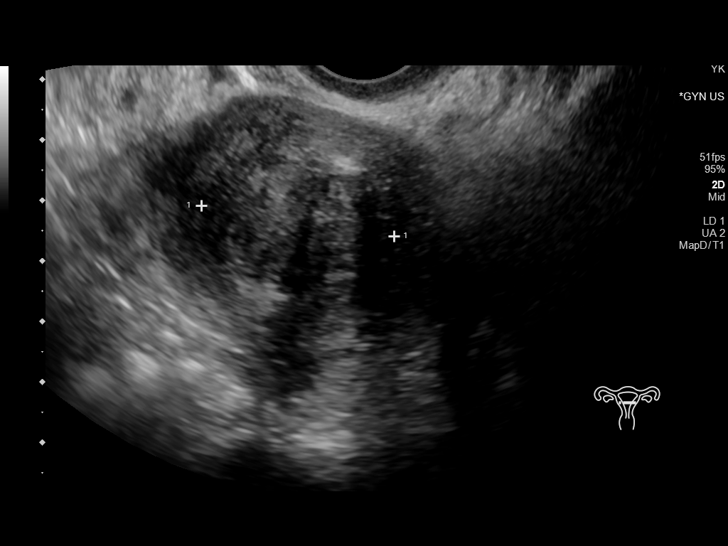
[im 48/95]
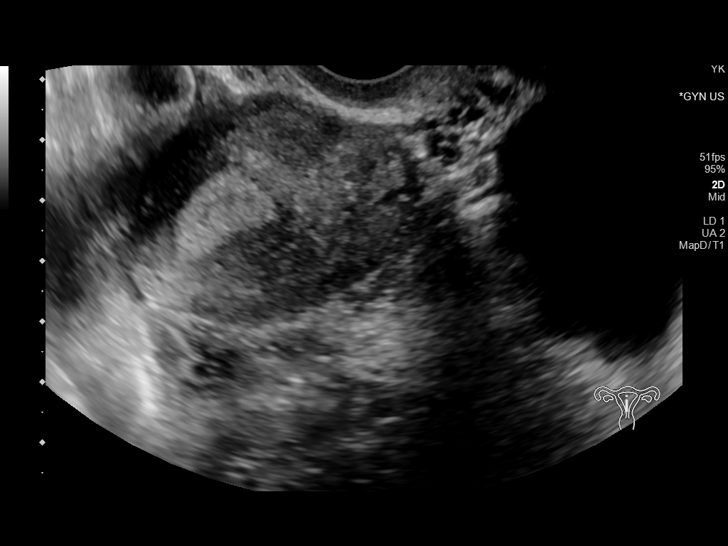
[im 55/95]
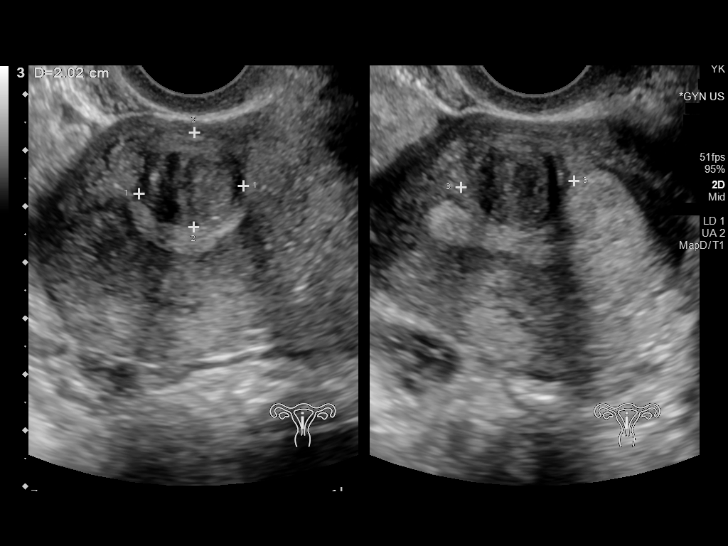
[im 63/95]
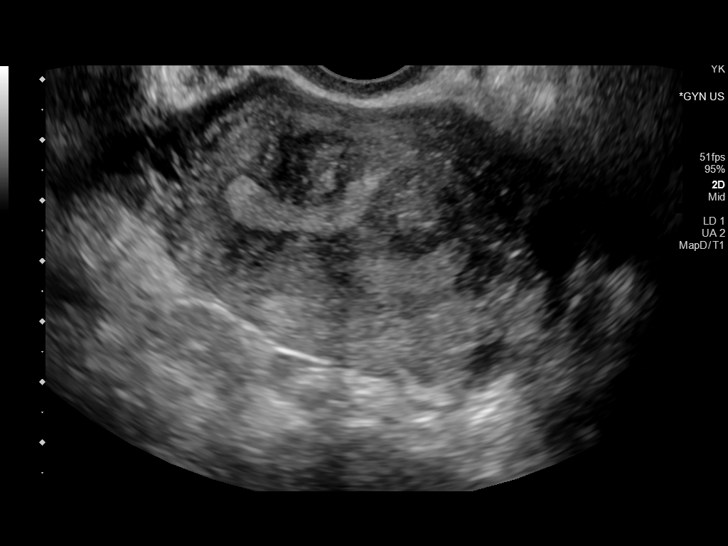
[im 71/95]
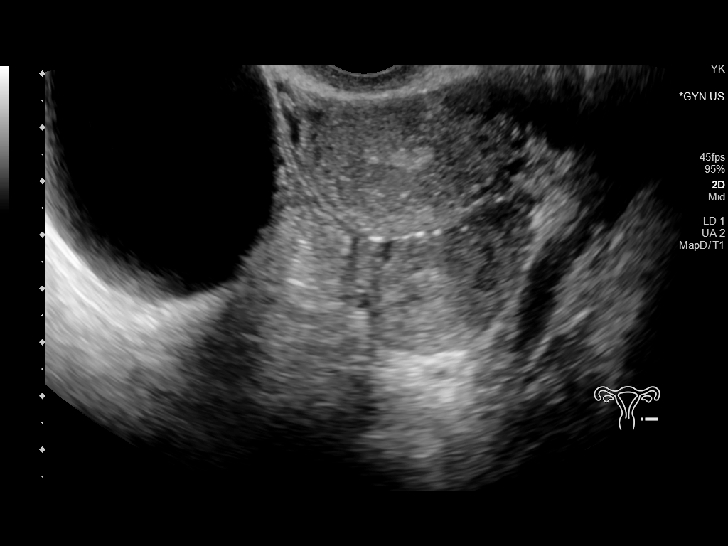
[im 79/95]
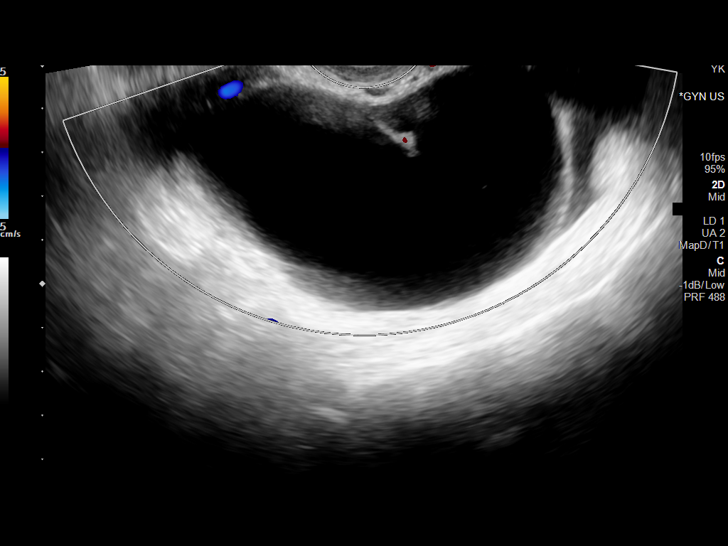
[im 87/95]
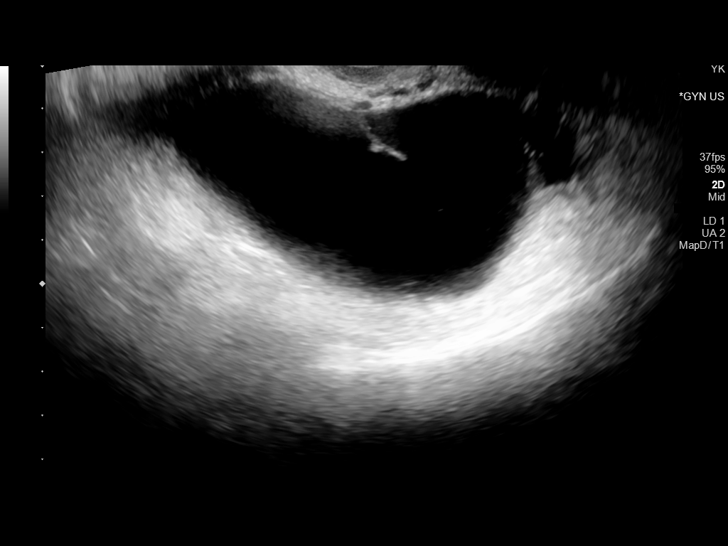
[im 95/95]
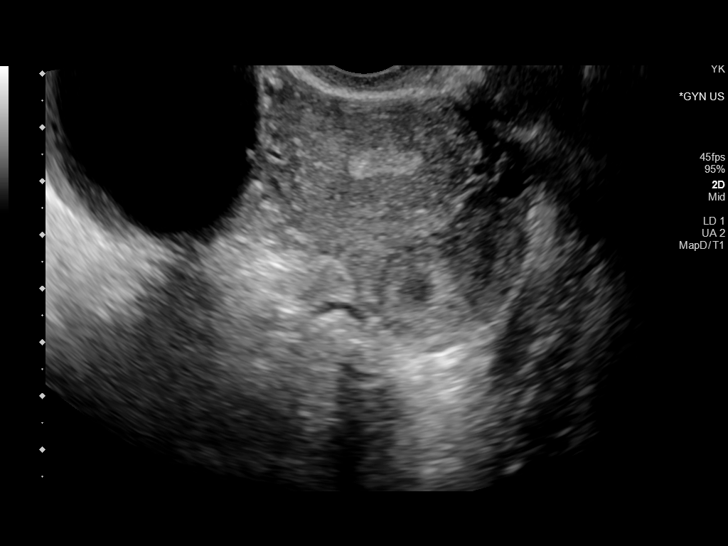

[13 of 25 positions shown; findings below may reference images not displayed]

FINDINGS: Uterus

Measurements: 9.0 x 4.6 x 6.0 cm = volume: 124 mL. Anteverted.
Septate morphology. Submucosal leiomyoma at anterior upper uterus
3.0 x 2.7 x 2.4 cm. Additional leiomyomata anterior mid uterus
submucosal 2.0 x 1.9 x 1.7 cm and intramural posteriorly mid uterus
0.9 x 0.9 x 0.8 cm.

Endometrium

Thickness: 15 mm. Septate morphology. No endometrial fluid or focal
mass

Right ovary

Surgically absent by history

Left ovary

Measurements: 4.5 x 2.5 x 2.6 cm = volume: 10 mL. Normal morphology
without mass

Other findings

Tubular cystic structure identified in RIGHT adnexa 11 x 5 x 5 cm,
appearance similar to that seen on the prior MR exam though
increased in size since prior study. Appearance favors hydrosalpinx.
No free pelvic fluid.
IMPRESSION: Septate uterus.

Multiple uterine leiomyomata including 2 submucosal leiomyomata at
anterior uterus.

Post RIGHT oophorectomy.

Suspected hydrosalpinx in RIGHT adnexa, slightly increased in size
since prior MR exam.

## 2021-05-07 ENCOUNTER — Other Ambulatory Visit: Payer: Self-pay

## 2021-05-07 ENCOUNTER — Encounter (HOSPITAL_COMMUNITY): Payer: Self-pay | Admitting: Obstetrics & Gynecology

## 2021-05-07 NOTE — Progress Notes (Signed)
SDW CALL ? ?Patient was given pre-op instructions over the phone. The opportunity was given for the patient to ask questions. No further questions asked. Patient verbalized understanding of instructions given. ? ? ?PCP - [facility] [place] ? ?PPM/ICD - N/A ?Chest x-ray - denies ?EKG - denies ?Stress Test - denies ?ECHO - denies ?Cardiac Cath - denies ? ?Sleep Study - denies ?ERAS Protcol - ERAS order ? ?COVID TEST- N/A ? ? ?Anesthesia review: no ? ?Patient denies shortness of breath, fever, cough and chest pain over the phone call ? ? ? ?Surgical Instructions ? ? ? Your procedure is scheduled on Thursday March 30 ? Report to [facility] Main Entrance "A" at 6:45 A.M., then check in with the Admitting office. ? Call this number if you have problems the morning of surgery: ? [phone] ? ? ? Remember: ? Do not eat after midnight the night before your surgery ? ?You may drink clear liquids until 6:15AM the morning of your surgery.   ?Clear liquids allowed are: Water, Non-Citrus Juices (without pulp), Carbonated Beverages, Clear Tea, Black Coffee ONLY (NO MILK, CREAM OR POWDERED CREAMER of any kind), and Gatorade ? ? Take these medicines the morning of surgery with A SIP OF WATER:  ? ?Megace (megestrol) ?Acetaminophen (tylenol) if needed ?tiZANidine (ZANAFLEX)  if needed ? ? ?As of today, STOP taking any Aspirin (unless otherwise instructed by your surgeon) Aleve, Naproxen, Ibuprofen, Motrin, Advil, Goody's, BC's, all herbal medications, fish oil, and all vitamins, diclofenac (Voltaren) ? ?[facility] is not responsible for any belongings or valuables.  ? ?Do NOT Smoke (Tobacco/Vaping)  24 hours prior to your procedure ?  ?Contacts, glasses, hearing aids, dentures or partials may not be worn into surgery, please bring cases for these belongings ?  ?Patients discharged the day of surgery will not be allowed to drive home, and someone needs to stay with them for 24 hours. ? ? ?SURGICAL WAITING  ROOM VISITATION ?No visitors are allowed in pre-op area with patient.  ?Patients having surgery or a procedure in a hospital may have two support people in the waiting room. ?Children under the age of 35 must have an adult with them who is not the patient. ?They may stay in the waiting area during the procedure and may switch out with other visitors. If the patient needs to stay at the hospital during part of their recovery, the visitor guidelines for inpatient rooms apply. ? ?Please refer to the [facility] website for the visitor guidelines for Inpatients (after your surgery is over and you are in a regular room).  ? ? ? ?Special instructions:   ? ?Oral Hygiene is also important to reduce your risk of infection.  Remember - BRUSH YOUR TEETH THE MORNING OF SURGERY WITH YOUR REGULAR TOOTHPASTE ? ? ?Day of Surgery: ? ?Take a shower the day of or night before with antibacterial soap. ?Wear Clean/Comfortable clothing the morning of surgery ?Do not apply any deodorants/lotions.   ?Do not wear jewelry or makeup ?Do not wear lotions, powders, perfumes/colognes, or deodorant. ?Do not shave 48 hours prior to surgery.  Men may shave face and neck. ?Do not bring valuables to the hospital. ?Do not wear nail polish, gel polish, artificial nails, or any other type of covering on natural nails (fingers and toes) ?If you have artificial nails or gel coating that need to be removed by a nail salon, please have this removed prior to surgery. Artificial nails or gel coating may  interfere with anesthesia's ability to adequately monitor your vital signs. ?Remember to brush your teeth WITH YOUR REGULAR TOOTHPASTE. ? ? ? ? ? ?

## 2021-05-08 ENCOUNTER — Ambulatory Visit (HOSPITAL_COMMUNITY): Payer: Commercial Managed Care - HMO | Admitting: Anesthesiology

## 2021-05-08 ENCOUNTER — Ambulatory Visit (HOSPITAL_BASED_OUTPATIENT_CLINIC_OR_DEPARTMENT_OTHER): Payer: Commercial Managed Care - HMO | Admitting: Anesthesiology

## 2021-05-08 ENCOUNTER — Ambulatory Visit (HOSPITAL_COMMUNITY)
Admission: RE | Admit: 2021-05-08 | Discharge: 2021-05-08 | Disposition: A | Discharge status: Home / Self Care | Payer: Commercial Managed Care - HMO | Attending: Obstetrics & Gynecology | Admitting: Obstetrics & Gynecology

## 2021-05-08 ENCOUNTER — Other Ambulatory Visit: Payer: Self-pay

## 2021-05-08 ENCOUNTER — Encounter (HOSPITAL_COMMUNITY): Payer: Self-pay | Admitting: Obstetrics & Gynecology

## 2021-05-08 ENCOUNTER — Encounter (HOSPITAL_COMMUNITY)
Admission: RE | Disposition: A | Discharge status: Home / Self Care | Payer: Self-pay | Source: Home / Self Care | Attending: Obstetrics & Gynecology

## 2021-05-08 DIAGNOSIS — N938 Other specified abnormal uterine and vaginal bleeding: Secondary | ICD-10-CM | POA: Diagnosis not present

## 2021-05-08 DIAGNOSIS — F17210 Nicotine dependence, cigarettes, uncomplicated: Secondary | ICD-10-CM | POA: Insufficient documentation

## 2021-05-08 DIAGNOSIS — D250 Submucous leiomyoma of uterus: Secondary | ICD-10-CM | POA: Insufficient documentation

## 2021-05-08 DIAGNOSIS — N939 Abnormal uterine and vaginal bleeding, unspecified: Secondary | ICD-10-CM | POA: Diagnosis present

## 2021-05-08 DIAGNOSIS — N840 Polyp of corpus uteri: Secondary | ICD-10-CM | POA: Insufficient documentation

## 2021-05-08 HISTORY — PX: DILITATION & CURRETTAGE/HYSTROSCOPY WITH NOVASURE ABLATION: SHX5568

## 2021-05-08 LAB — CBC
HCT: 38.8 % (ref 36.0–46.0)
Hemoglobin: 13.1 g/dL (ref 12.0–15.0)
MCH: 28.5 pg (ref 26.0–34.0)
MCHC: 33.8 g/dL (ref 30.0–36.0)
MCV: 84.5 fL (ref 80.0–100.0)
Platelets: 391 10*3/uL (ref 150–400)
RBC: 4.59 MIL/uL (ref 3.87–5.11)
RDW: 15.4 % (ref 11.5–15.5)
WBC: 6.2 10*3/uL (ref 4.0–10.5)
nRBC: 0 % (ref 0.0–0.2)

## 2021-05-08 LAB — POCT PREGNANCY, URINE: Preg Test, Ur: NEGATIVE

## 2021-05-08 SURGERY — DILATATION & CURETTAGE/HYSTEROSCOPY WITH NOVASURE ABLATION
Anesthesia: General | Site: Uterus

## 2021-05-08 MED ORDER — MIDAZOLAM HCL 2 MG/2ML IJ SOLN
INTRAMUSCULAR | Status: AC
  Filled 2021-05-08: qty 2

## 2021-05-08 MED ORDER — PROMETHAZINE HCL 25 MG/ML IJ SOLN: 6.2500 mg | INTRAMUSCULAR | Status: DC | PRN

## 2021-05-08 MED ORDER — FENTANYL CITRATE (PF) 250 MCG/5ML IJ SOLN
INTRAMUSCULAR | Status: AC
Start: 2021-05-08 — End: ?
  Filled 2021-05-08: qty 5

## 2021-05-08 MED ORDER — BUPIVACAINE HCL (PF) 0.5 % IJ SOLN
INTRAMUSCULAR | Status: AC
  Filled 2021-05-08: qty 30

## 2021-05-08 MED ORDER — MEPERIDINE HCL 25 MG/ML IJ SOLN: 6.2500 mg | INTRAMUSCULAR | Status: DC | PRN

## 2021-05-08 MED ORDER — DEXAMETHASONE SODIUM PHOSPHATE 10 MG/ML IJ SOLN
INTRAMUSCULAR | Status: AC
  Filled 2021-05-08: qty 1

## 2021-05-08 MED ORDER — LIDOCAINE 2% (20 MG/ML) 5 ML SYRINGE
INTRAMUSCULAR | Status: DC | PRN
  Administered 2021-05-08: 20 mg via INTRAVENOUS

## 2021-05-08 MED ORDER — DEXAMETHASONE SODIUM PHOSPHATE 10 MG/ML IJ SOLN
INTRAMUSCULAR | Status: DC | PRN
  Administered 2021-05-08: 10 mg via INTRAVENOUS

## 2021-05-08 MED ORDER — ONDANSETRON HCL 4 MG/2ML IJ SOLN
INTRAMUSCULAR | Status: AC
  Filled 2021-05-08: qty 2

## 2021-05-08 MED ORDER — KETOROLAC TROMETHAMINE 30 MG/ML IJ SOLN
INTRAMUSCULAR | Status: AC
  Filled 2021-05-08: qty 1

## 2021-05-08 MED ORDER — ORAL CARE MOUTH RINSE: 15.0000 mL | Freq: Once | OROMUCOSAL | Status: AC

## 2021-05-08 MED ORDER — FENTANYL CITRATE (PF) 250 MCG/5ML IJ SOLN
INTRAMUSCULAR | Status: DC | PRN
  Administered 2021-05-08: 25 ug via INTRAVENOUS
  Administered 2021-05-08: 50 ug via INTRAVENOUS
  Administered 2021-05-08: 25 ug via INTRAVENOUS

## 2021-05-08 MED ORDER — FENTANYL CITRATE (PF) 100 MCG/2ML IJ SOLN
25.0000 ug | INTRAMUSCULAR | Status: DC | PRN
  Administered 2021-05-08: 50 ug via INTRAVENOUS

## 2021-05-08 MED ORDER — KETOROLAC TROMETHAMINE 30 MG/ML IJ SOLN
30.0000 mg | Freq: Once | INTRAMUSCULAR | Status: AC
  Administered 2021-05-08: 30 mg via INTRAVENOUS

## 2021-05-08 MED ORDER — ONDANSETRON HCL 4 MG/2ML IJ SOLN
INTRAMUSCULAR | Status: DC | PRN
  Administered 2021-05-08: 4 mg via INTRAVENOUS

## 2021-05-08 MED ORDER — FERRIC SUBSULFATE 259 MG/GM EX SOLN
CUTANEOUS | Status: AC
  Filled 2021-05-08: qty 8

## 2021-05-08 MED ORDER — ACETAMINOPHEN 500 MG PO TABS
1000.0000 mg | ORAL_TABLET | Freq: Once | ORAL | Status: AC
  Administered 2021-05-08: 1000 mg via ORAL
  Filled 2021-05-08: qty 2

## 2021-05-08 MED ORDER — ACETAMINOPHEN 500 MG PO TABS
ORAL_TABLET | ORAL | Status: AC
  Filled 2021-05-08: qty 1

## 2021-05-08 MED ORDER — CHLORHEXIDINE GLUCONATE 0.12 % MT SOLN
15.0000 mL | Freq: Once | OROMUCOSAL | Status: AC
  Administered 2021-05-08: 15 mL via OROMUCOSAL
  Filled 2021-05-08: qty 15

## 2021-05-08 MED ORDER — IBUPROFEN 800 MG PO TABS: 800.0000 mg | ORAL_TABLET | Freq: Three times a day (TID) | ORAL | 0 refills | Status: DC | PRN

## 2021-05-08 MED ORDER — PROPOFOL 10 MG/ML IV BOLUS
INTRAVENOUS | Status: DC | PRN
  Administered 2021-05-08: 200 mg via INTRAVENOUS

## 2021-05-08 MED ORDER — MIDAZOLAM HCL 2 MG/2ML IJ SOLN
INTRAMUSCULAR | Status: DC | PRN
  Administered 2021-05-08: 2 mg via INTRAVENOUS

## 2021-05-08 MED ORDER — BUPIVACAINE HCL (PF) 0.5 % IJ SOLN
INTRAMUSCULAR | Status: DC | PRN
Start: 2021-05-08 — End: 2021-05-08
  Administered 2021-05-08: 20 mL

## 2021-05-08 MED ORDER — OXYCODONE HCL 5 MG/5ML PO SOLN: 5.0000 mg | Freq: Once | ORAL | Status: DC | PRN

## 2021-05-08 MED ORDER — LACTATED RINGERS IV SOLN: INTRAVENOUS | Status: DC

## 2021-05-08 MED ORDER — PROPOFOL 10 MG/ML IV BOLUS
INTRAVENOUS | Status: AC
  Filled 2021-05-08: qty 20

## 2021-05-08 MED ORDER — DEXMEDETOMIDINE (PRECEDEX) IN NS 20 MCG/5ML (4 MCG/ML) IV SYRINGE
PREFILLED_SYRINGE | INTRAVENOUS | Status: AC
  Filled 2021-05-08: qty 5

## 2021-05-08 MED ORDER — SODIUM CHLORIDE 0.9 % IR SOLN
Status: DC | PRN
  Administered 2021-05-08: 3000 mL

## 2021-05-08 MED ORDER — OXYCODONE HCL 5 MG PO TABS: 5.0000 mg | ORAL_TABLET | Freq: Once | ORAL | Status: DC | PRN

## 2021-05-08 MED ORDER — DEXMEDETOMIDINE (PRECEDEX) IN NS 20 MCG/5ML (4 MCG/ML) IV SYRINGE
PREFILLED_SYRINGE | INTRAVENOUS | Status: DC | PRN
  Administered 2021-05-08 (×2): 4 ug via INTRAVENOUS

## 2021-05-08 MED ORDER — LIDOCAINE 2% (20 MG/ML) 5 ML SYRINGE
INTRAMUSCULAR | Status: AC
  Filled 2021-05-08: qty 5

## 2021-05-08 MED ORDER — MIDAZOLAM HCL 2 MG/2ML IJ SOLN: 0.5000 mg | Freq: Once | INTRAMUSCULAR | Status: DC | PRN

## 2021-05-08 MED ORDER — FENTANYL CITRATE (PF) 100 MCG/2ML IJ SOLN
INTRAMUSCULAR | Status: AC
  Filled 2021-05-08: qty 2

## 2021-05-08 SURGICAL SUPPLY — 14 items
ABLATOR SURESOUND NOVASURE (ABLATOR) ×3 IMPLANT
CATH ROBINSON RED A/P 16FR (CATHETERS) ×3 IMPLANT
GLOVE SURG ENC MOIS LTX SZ7 (GLOVE) ×3 IMPLANT
GLOVE SURG UNDER POLY LF SZ7 (GLOVE) ×6 IMPLANT
GOWN STRL REUS W/ TWL LRG LVL3 (GOWN DISPOSABLE) ×2 IMPLANT
GOWN STRL REUS W/ TWL XL LVL3 (GOWN DISPOSABLE) ×2 IMPLANT
GOWN STRL REUS W/TWL LRG LVL3 (GOWN DISPOSABLE) ×2
GOWN STRL REUS W/TWL XL LVL3 (GOWN DISPOSABLE) ×2
KIT PROCEDURE FLUENT (KITS) ×3 IMPLANT
KIT TURNOVER KIT B (KITS) ×3 IMPLANT
PACK VAGINAL MINOR WOMEN LF (CUSTOM PROCEDURE TRAY) ×3 IMPLANT
PAD OB MATERNITY 4.3X12.25 (PERSONAL CARE ITEMS) ×3 IMPLANT
SEAL ROD LENS SCOPE MYOSURE (ABLATOR) ×1 IMPLANT
TOWEL GREEN STERILE FF (TOWEL DISPOSABLE) ×6 IMPLANT

## 2021-05-08 NOTE — H&P (Signed)
Preoperative History and Physical ? ?[name] is a 41 y.o. G0P0 here for surgical management of AUB.  ? ?Proposed surgery: hysteroscopy with dilation and curettage and endometrial ablation using Novasure.  ? ?Past Medical History:  ?Diagnosis Date  ? Endometriosis   ? History of 2019 novel coronavirus disease (COVID-19)   ? pt tested positive 03-03-2019, results in care everywhere.  (05-22-2019 per pt had symptoms of fatigue, headache, and loss of taste/ smell;  fatigue and headache after one week and loss taste/ smell resolved after 3 wks)  ? Infertility, female   ? Ovarian cyst   ? Pelvic mass in female   ? large fluid filled pelvic mass  ? Recurrent boils   ? Septate uterus   ? Trichomoniasis   ? ?Past Surgical History:  ?Procedure Laterality Date  ? BREAST BIOPSY Right 1999  ? per pt benign   ? LAPAROSCOPIC SALPINGOOPHERECTOMY Right 03-25-2010   '@[facility]'$   ? and LYSIS ADHESIONS  ? LAPAROSCOPY N/A 05/30/2019  ? Procedure: DIAGNOSTIC LAPAROSCOPY;  Surgeon: Woodroe Mode, MD;  Location: [facility];  Service: Gynecology;  Laterality: N/A;  ? ?OB History   ? ? Gravida  ?0  ? Para  ?   ? Term  ?   ? Preterm  ?   ? AB  ?   ? Living  ?0  ?  ? ? SAB  ?   ? IAB  ?   ? Ectopic  ?   ? Multiple  ?   ? Live Births  ?   ?   ?  ?  ? ?Patient denies any cervical dysplasia or STIs. ?Medications Prior to Admission  ?Medication Sig Dispense Refill Last Dose  ? acetaminophen (TYLENOL) 500 MG tablet Take 1,000 mg by mouth every 8 (eight) hours as needed for moderate pain.   Past Week  ? megestrol (MEGACE) 40 MG tablet Take 1 tablet (40 mg total) by mouth daily. 60 tablet 2 Past Week  ? tiZANidine (ZANAFLEX) 4 MG tablet Take 1 tablet (4 mg total) by mouth every 6 (six) hours as needed for muscle spasms. 20 tablet 0 Past Week  ? diclofenac (VOLTAREN) 75 MG EC tablet Take 1 tablet (75 mg total) by mouth 2 (two) times daily. 14 tablet 0   ?  ?Allergies  ?Allergen Reactions  ? Sulfa Antibiotics Other (See Comments)  ?   Upset stomach - can tolerate   ? ?Social History:   reports that she has been smoking cigarettes. She has a 3.75 pack-year smoking history. She has never used smokeless tobacco. She reports current alcohol use. She reports that she does not use drugs. ?Family History  ?Problem Relation Age of Onset  ? Hypertension Mother   ? Diabetes Mother   ? Cancer Father   ? Hypertension Father   ? Diabetes Brother   ? Hypertension Brother   ? Breast cancer Neg Hx   ? ? ?Review of Systems: Noncontributory ? ?PHYSICAL EXAM: ?Blood pressure 104/78, pulse 86, temperature 98.5 ?F (36.9 ?C), temperature source Oral, resp. rate 18, height '5\' 2"'$  (1.575 m), weight 79.4 kg, SpO2 99 %. ?General appearance - alert, well appearing, and in no distress ?Chest - clear to auscultation, no wheezes, rales or rhonchi, symmetric air entry ?Heart - normal rate and regular rhythm ?Abdomen - soft, nontender, nondistended, no masses or organomegaly ?Pelvic - examination not indicated ?Extremities - peripheral pulses normal, no pedal edema, no clubbing or cyanosis ? ?Labs: ?Results for  orders placed or performed during the hospital encounter of 05/08/21 (from the past 336 hour(s))  ?CBC per protocol  ? Collection Time: 05/08/21  7:06 AM  ?Result Value Ref Range  ? WBC 6.2 4.0 - 10.5 K/uL  ? RBC 4.59 3.87 - 5.11 MIL/uL  ? Hemoglobin 13.1 12.0 - 15.0 g/dL  ? HCT 38.8 36.0 - 46.0 %  ? MCV 84.5 80.0 - 100.0 fL  ? MCH 28.5 26.0 - 34.0 pg  ? MCHC 33.8 30.0 - 36.0 g/dL  ? RDW 15.4 11.5 - 15.5 %  ? Platelets 391 150 - 400 K/uL  ? nRBC 0.0 0.0 - 0.2 %  ?Pregnancy, urine POC  ? Collection Time: 05/08/21  7:26 AM  ?Result Value Ref Range  ? Preg Test, Ur NEGATIVE NEGATIVE  ? ?03/26/2021 ?ENDOMETRIUM, BIOPSY:  ?- Scant fragments of benign endometrial polyp  ?- Benign secretory endometrium  ?- Negative for hyperplasia or malignancy  ? ?Imaging Studies: ?03/24/2021 ?CLINICAL DATA:  Pelvic neck pain, dysmenorrhea, abnormal uterine ?bleeding, history of hydrosalpinx and  uterine leiomyoma, prior RIGHT ?salpingo oophorectomy ?  ?EXAM: ?TRANSABDOMINAL AND TRANSVAGINAL ULTRASOUND OF PELVIS ?  ?TECHNIQUE: ?Both transabdominal and transvaginal ultrasound examinations of the ?pelvis were performed. Transabdominal technique was performed for ?global imaging of the pelvis including uterus, ovaries, adnexal ?regions, and pelvic cul-de-sac. It was necessary to proceed with ?endovaginal exam following the transabdominal exam to visualize the ?endometrium and LEFT ovary. ?  ?COMPARISON:  11/21/2020 ?  ?FINDINGS: ?Uterus ?  ?Measurements: 8.6 x 3.9 x 5.6 cm = volume: 99 mL. Anteverted. ?Heterogeneous myometrium. Submucosal leiomyoma anterior upper uterus ?2.0 x 1.9 x 1.9 cm. Additional intramural leiomyoma LEFT uterus 2.2 ?x 2.3 x 2.2 cm. ?  ?Endometrium ?  ?Thickness: 6 mm.  No endometrial fluid or mass ?  ?Right ovary ?  ?Surgically absent ?  ?Left ovary ?  ?Measurements: 2.7 x 1.6 x 1.9 cm = volume: 4.3 mL. Normal morphology ?without mass ?  ?Other findings ?  ?No free pelvic fluid.  No adnexal masses. ?  ?IMPRESSION: ?Two small uterine leiomyomata, one of which extend submucosal. ?  ?Unremarkable endometrial complex and LEFT ovary. ?  ?Surgical absence of RIGHT ovary. ?  ? ?Assessment: ?Patient Active Problem List  ? Diagnosis Date Noted  ? Exposure to sexually transmitted disease (STD) 03/06/2021  ? Molluscum contagiosum 12/19/2020  ? Abdominal pain 08/16/2020  ? Constipation 08/16/2020  ? Frequent urination 06/24/2020  ? Hydradenitis 06/24/2020  ? Ganglion cyst of left foot 06/13/2020  ? 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  ? Menorrhagia 12/25/2019  ? Vaginal discharge 07/08/2019  ? GERD (gastroesophageal reflux disease) 06/07/2019  ? HSV-2 seropositive 03/07/2019  ? Hydrosalpinx 12/30/2018  ? Endometriosis 05/01/2011  ? SEPTATE UTERUS 07/24/2009  ? TOBACCO ABUSE 09/26/2008  ? Female infertility 09/26/2008  ? Menometrorrhagia 09/21/2006  ? ? ?Plan: ?Patient will undergo surgical management with  hysteroscopy with dilation and curettage and endometrial ablation using Novasure possible myomectomy.   The risks of surgery were discussed in detail with the patient including but not limited to: bleeding which may require transfusion or reoperation; infection which may require antibiotics; injury to surrounding organs which may involve bowel, bladder, ureters ; need for additional procedures including laparoscopy or laparotomy; thromboembolic phenomenon, surgical site problems and other postoperative/anesthesia complications. Likelihood of success in alleviating the patient's condition was discussed. Routine postoperative instructions will be reviewed with the patient and her family in detail after surgery.  The patient concurred with the proposed plan, giving informed  written consent for the surgery.  Patient has been NPO since last night she will remain NPO for procedure.  Anesthesia and OR aware.  Preoperative prophylactic antibiotics and SCDs ordered on call to the OR.  To OR when ready. ? ?Lei Dower L. Ihor Dow, M.D., FACOG ?05/08/2021 8:50 AM ?  ? ?

## 2021-05-08 NOTE — Op Note (Signed)
05/08/2021 ? ?10:15 AM ? ?PATIENT:  [name]  40 y.o. female ? ?PRE-OPERATIVE DIAGNOSIS:  ABNORMAL UTERINE BLEEDING, POLYP ? ?POST-OPERATIVE DIAGNOSIS:  ABNORMAL UTERINE BLEEDING, POLYP ? ?PROCEDURE:  Procedure(s): ?HYSTEROSCOPY, DILATATION & CURETTAGE, ENDOMETRIAL ABLATION WITH NOVASURE (N/A) ? ?SURGEON:  Surgeon(s) and Role: ?   Lavonia Drafts, MD - Primary ? ?ANESTHESIA:   local and general ? ?EBL:  50 mL  ? ?BLOOD ADMINISTERED:none ? ?DRAINS: none  ? ?LOCAL MEDICATIONS USED:  MARCAINE    ? ?SPECIMEN:  Source of Specimen:  endometrial curettings ? ?DISPOSITION OF SPECIMEN:  PATHOLOGY ? ?COUNTS:  YES ? ?TOURNIQUET:  * No tourniquets in log * ? ?DICTATION: .Note written in EPIC ? ?PLAN OF CARE: Discharge to home after PACU ? ?PATIENT DISPOSITION:  PACU - hemodynamically stable. ?  ?Delay start of Pharmacological VTE agent (>24hrs) due to surgical blood loss or risk of bleeding: not applicable ? ?Complications: none immediate ? ?Indications: heavy abnormal uterine bleeding ? ?Findings: several endometrial polyps and a lot of blood in the vault and active bleeding. Resolved post ablation.   ? ?The risks, benefits, and alternatives of surgery were explained, understood, and accepted. The consents were signed and all questions were answered. She was taken to the operating room and general anesthesia was applied without complication. She was placed in the dorsal lithotomy position and her vagina and abdomen were prepped and draped in the usual sterile fashion. A bimanual exam revealed a normal size and shape anteverted mobile uterus. Her adnexa were non-enlarged.   ?A bivalved speculum was placed in the patients' vagina and the anterior lip of the cervix was grasped with a single toothed tenaculum. A paracervical block was performed at 5 and 7 o'clock with total of 20cc of 0.5% Marcaine.   The endometrial cavity was sounded to 8.5cm and the endocervical length measured 3cm. A hysteroscope was inserted and  the endometrium was noted to be thickened with several polyps.  The ostia on both sides were noted.  The scope was removed and a sharp currete was used to scape the lining of the uterus until a gritty texture was noted throughout.  Specimens were sent to pathology.  The NovaSure device was then inserted and seated using 5.5cm as the cavity length and 3.5cm as the cavity width.  The total activation time was 80 sec at a power of 106.  The hysteroscope was reinserted and an even burn pattern was noted to the fundus.  The single toothed tenaculum was removed at the end of the case and no bleeding was noted from the cervix.   The patient was extubated and taken to the recovery room in stable condition.  Sponge, lap and instrument counts were correct.  There were no complications. ? ? ?[name], M.D., [place] ? ? ? ? ?  ?

## 2021-05-08 NOTE — Anesthesia Postprocedure Evaluation (Signed)
Anesthesia Post Note ? ?Patient: [name] ? ?Procedure(s) Performed: HYSTEROSCOPY, DILATATION & CURETTAGE, ENDOMETRIAL ABLATION WITH NOVASURE (Uterus) ? ?  ? ?Patient location during evaluation: PACU ?Anesthesia Type: General ?Level of consciousness: awake and alert, oriented and patient cooperative ?Pain management: pain level controlled ?Vital Signs Assessment: post-procedure vital signs reviewed and stable ?Respiratory status: spontaneous breathing, nonlabored ventilation and respiratory function stable ?Cardiovascular status: blood pressure returned to baseline and stable ?Postop Assessment: no apparent nausea or vomiting and able to ambulate ?Anesthetic complications: no ? ? ?No notable events documented. ? ?Last Vitals:  ?Vitals:  ? 05/08/21 1020 05/08/21 1035  ?BP: (!) 127/96 120/77  ?Pulse: 73 70  ?Resp: 16 15  ?Temp:  (!) 36.4 ?C  ?SpO2: 100% 100%  ?  ?Last Pain:  ?Vitals:  ? 05/08/21 1035  ?TempSrc:   ?PainSc: 3   ? ? ?  ?  ?  ?  ?  ?  ? ?[name],E. Jaedynn Bohlken ? ? ? ? ?

## 2021-05-08 NOTE — Anesthesia Procedure Notes (Signed)
Procedure Name: LMA Insertion ?Date/Time: 05/08/2021 9:12 AM ?Performed by: Lorie Phenix, CRNA ?Pre-anesthesia Checklist: Patient identified, Emergency Drugs available, Suction available and Patient being monitored ?Patient Re-evaluated:Patient Re-evaluated prior to induction ?Oxygen Delivery Method: Circle System Utilized ?Preoxygenation: Pre-oxygenation with 100% oxygen ?Induction Type: IV induction ?Ventilation: Mask ventilation without difficulty ?LMA: LMA inserted ?LMA Size: 4.0 ?Number of attempts: 1 ?Placement Confirmation: positive ETCO2 ?Tube secured with: Tape ?Dental Injury: Teeth and Oropharynx as per pre-operative assessment  ? ? ? ? ?

## 2021-05-08 NOTE — Transfer of Care (Signed)
Immediate Anesthesia Transfer of Care Note ? ?Patient: [name] ? ?Procedure(s) Performed: HYSTEROSCOPY, DILATATION & CURETTAGE, ENDOMETRIAL ABLATION WITH NOVASURE (Uterus) ? ?Patient Location: PACU ? ?Anesthesia Type:General ? ?Level of Consciousness: awake and alert  ? ?Airway & Oxygen Therapy: Patient Spontanous Breathing ? ?Post-op Assessment: Report given to RN and Post -op Vital signs reviewed and stable ? ?Post vital signs: Reviewed and stable ? ?Last Vitals:  ?Vitals Value Taken Time  ?BP 133/81   ?Temp    ?Pulse 80 05/08/21 1004  ?Resp 9 05/08/21 1004  ?SpO2 100 % 05/08/21 1004  ?Vitals shown include unvalidated device data. ? ?Last Pain:  ?Vitals:  ? 05/08/21 0711  ?TempSrc:   ?PainSc: 0-No pain  ?   ? ?  ? ?Complications: No notable events documented. ?

## 2021-05-08 NOTE — Brief Op Note (Signed)
05/08/2021 ? ?10:15 AM ? ?PATIENT:  [name]  41 y.o. female ? ?PRE-OPERATIVE DIAGNOSIS:  ABNORMAL UTERINE BLEEDING, POLYP ? ?POST-OPERATIVE DIAGNOSIS:  ABNORMAL UTERINE BLEEDING, POLYP ? ?PROCEDURE:  Procedure(s): ?HYSTEROSCOPY, DILATATION & CURETTAGE, ENDOMETRIAL ABLATION WITH NOVASURE (N/A) ? ?SURGEON:  Surgeon(s) and Role: ?   Lavonia Drafts, MD - Primary ? ?ANESTHESIA:   local and general ? ?EBL:  50 mL  ? ?BLOOD ADMINISTERED:none ? ?DRAINS: none  ? ?LOCAL MEDICATIONS USED:  MARCAINE    ? ?SPECIMEN:  Source of Specimen:  endometrial curettings ? ?DISPOSITION OF SPECIMEN:  PATHOLOGY ? ?COUNTS:  YES ? ?TOURNIQUET:  * No tourniquets in log * ? ?DICTATION: .Note written in EPIC ? ?PLAN OF CARE: Discharge to home after PACU ? ?PATIENT DISPOSITION:  PACU - hemodynamically stable. ?  ?Delay start of Pharmacological VTE agent (>24hrs) due to surgical blood loss or risk of bleeding: not applicable ? ?Complications: none immediate ? ?[name] L. Harraway-Smith, M.D., [place] ?   ? ?

## 2021-05-08 NOTE — Anesthesia Preprocedure Evaluation (Signed)
Anesthesia Evaluation  ?Patient identified by MRN, date of birth, ID band ?Patient awake ? ? ? ?Reviewed: ?Allergy & Precautions, NPO status , Patient's Chart, lab work & pertinent test results ? ?History of Anesthesia Complications ?Negative for: history of anesthetic complications ? ?Airway ?Mallampati: II ? ?TM Distance: >3 FB ?Neck ROM: Full ? ? ? Dental ? ?(+) Chipped, Dental Advisory Given ?  ?Pulmonary ?Current Smoker and Patient abstained from smoking.,  ?  ?breath sounds clear to auscultation ? ? ? ? ? ? Cardiovascular ?negative cardio ROS ? ? ?Rhythm:Regular Rate:Normal ? ? ?  ?Neuro/Psych ?negative neurological ROS ?   ? GI/Hepatic ?negative GI ROS, Neg liver ROS,   ?Endo/Other  ?obese ? Renal/GU ?negative Renal ROS  ? ?  ?Musculoskeletal ? ? Abdominal ?(+) + obese,   ?Peds ? Hematology ?negative hematology ROS ?(+)   ?Anesthesia Other Findings ? ? Reproductive/Obstetrics ? ?  ? ? ? ? ? ? ? ? ? ? ? ? ? ?  ?  ? ? ? ? ? ? ? ? ?Anesthesia Physical ?Anesthesia Plan ? ?ASA: 2 ? ?Anesthesia Plan: General  ? ?Post-op Pain Management: Tylenol PO (pre-op)*  ? ?Induction: Intravenous ? ?PONV Risk Score and Plan: 2 and Ondansetron and Dexamethasone ? ?Airway Management Planned: LMA ? ?Additional Equipment: None ? ?Intra-op Plan:  ? ?Post-operative Plan:  ? ?Informed Consent: I have reviewed the patients History and Physical, chart, labs and discussed the procedure including the risks, benefits and alternatives for the proposed anesthesia with the patient or authorized representative who has indicated his/her understanding and acceptance.  ? ? ? ?Dental advisory given ? ?Plan Discussed with: CRNA and Surgeon ? ?Anesthesia Plan Comments:   ? ? ? ? ? ? ?Anesthesia Quick Evaluation ? ?

## 2021-05-09 ENCOUNTER — Encounter (HOSPITAL_COMMUNITY): Payer: Self-pay | Admitting: Obstetrics & Gynecology

## 2021-05-14 LAB — SURGICAL PATHOLOGY

## 2021-05-15 ENCOUNTER — Other Ambulatory Visit: Payer: Self-pay | Admitting: Student

## 2021-05-15 DIAGNOSIS — Z1231 Encounter for screening mammogram for malignant neoplasm of breast: Secondary | ICD-10-CM

## 2021-06-02 ENCOUNTER — Ambulatory Visit
Admission: RE | Admit: 2021-06-02 | Discharge: 2021-06-02 | Disposition: A | Discharge status: Home / Self Care | Payer: Managed Care, Other (non HMO) | Source: Ambulatory Visit | Attending: Family Medicine | Admitting: Family Medicine

## 2021-06-02 DIAGNOSIS — Z1231 Encounter for screening mammogram for malignant neoplasm of breast: Secondary | ICD-10-CM

## 2021-06-04 ENCOUNTER — Encounter: Payer: Self-pay | Admitting: Obstetrics & Gynecology

## 2021-06-04 ENCOUNTER — Encounter: Payer: Self-pay | Admitting: General Practice

## 2021-06-04 ENCOUNTER — Ambulatory Visit (INDEPENDENT_AMBULATORY_CARE_PROVIDER_SITE_OTHER): Payer: Commercial Managed Care - HMO | Admitting: Obstetrics & Gynecology

## 2021-06-04 VITALS — BP 107/64 | HR 68 | Wt 170.0 lb

## 2021-06-04 DIAGNOSIS — N840 Polyp of corpus uteri: Secondary | ICD-10-CM

## 2021-06-04 DIAGNOSIS — N939 Abnormal uterine and vaginal bleeding, unspecified: Secondary | ICD-10-CM

## 2021-06-04 NOTE — Progress Notes (Signed)
History:  ?41 y.o.[name] here today for 2 week post op check.Pt is s/p HYSTEROSCOPY, DILATATION & CURETTAGE, ENDOMETRIAL ABLATION WITH NOVASURE on 05/08/2020.  Pt reports that she is doing well. She has not had bleeding for >5 days. She denies any pain.     ? ?The following portions of the patient's history were reviewed and updated as appropriate: allergies, current medications, past family history, past medical history, past social history, past surgical history and problem list. ? ?Review of Systems:  ?Pertinent items are noted in HPI. ?   ?Objective:  ?Physical Exam ?BP 107/64   Pulse 68   Wt 170 lb (77.1 kg)   BMI 31.09 kg/m?  ? ?CONSTITUTIONAL: Well-developed, well-nourished female in no acute distress.  ?HENT:  Normocephalic, atraumatic ?EYES: Conjunctivae and EOM are normal. No scleral icterus.  ?NECK: Normal range of motion ?SKIN: Skin is warm and dry. No rash noted. Not diaphoretic.No pallor. ?[place]: Alert and oriented to person, place, and time. Normal coordination.  ?Pelvic: deferred ? ?Labs and Imaging ?Surg path 05/08/2021 ?FINAL MICROSCOPIC DIAGNOSIS:  ? ?A. ENDOMETRIUM, CURETTAGE:  ?Proliferative type endometrium.  ?Infarcted polypoid fragments of decidual tissue consistent with  ?placental site polyps.  ?Negative for hyperplasia and malignancy.   ? ?Assessment & Plan:  ?4 week post op check following endometrial ablation  ? Doing well ? Reviewed her surg path.  ? Reviewed post op instructions and activities ? F/u in 3 months or sooner prn ? All questions answered.  ? ?Chenay Nesmith L. Harraway-Smith, M.D., [place]  ?

## 2021-06-11 ENCOUNTER — Ambulatory Visit: Payer: Managed Care, Other (non HMO) | Admitting: Obstetrics & Gynecology

## 2021-06-12 ENCOUNTER — Other Ambulatory Visit: Payer: Self-pay | Admitting: Obstetrics & Gynecology

## 2021-06-12 MED ORDER — IBUPROFEN 800 MG PO TABS: 800.0000 mg | ORAL_TABLET | Freq: Three times a day (TID) | ORAL | 2 refills | Status: DC | PRN

## 2021-06-14 ENCOUNTER — Encounter (HOSPITAL_COMMUNITY): Payer: Self-pay | Admitting: Emergency Medicine

## 2021-06-14 ENCOUNTER — Other Ambulatory Visit: Payer: Self-pay | Admitting: Nurse Practitioner

## 2021-06-14 ENCOUNTER — Ambulatory Visit (HOSPITAL_COMMUNITY)
Admission: EM | Admit: 2021-06-14 | Discharge: 2021-06-14 | Disposition: A | Discharge status: Home / Self Care | Payer: Commercial Managed Care - HMO | Attending: Nurse Practitioner | Admitting: Nurse Practitioner

## 2021-06-14 DIAGNOSIS — R300 Dysuria: Secondary | ICD-10-CM | POA: Insufficient documentation

## 2021-06-14 DIAGNOSIS — R310 Gross hematuria: Secondary | ICD-10-CM | POA: Insufficient documentation

## 2021-06-14 DIAGNOSIS — N939 Abnormal uterine and vaginal bleeding, unspecified: Secondary | ICD-10-CM | POA: Diagnosis not present

## 2021-06-14 LAB — CBC
HCT: 40 % (ref 36.0–46.0)
Hemoglobin: 13.3 g/dL (ref 12.0–15.0)
MCH: 28.5 pg (ref 26.0–34.0)
MCHC: 33.3 g/dL (ref 30.0–36.0)
MCV: 85.8 fL (ref 80.0–100.0)
Platelets: 361 10*3/uL (ref 150–400)
RBC: 4.66 MIL/uL (ref 3.87–5.11)
RDW: 15.9 % — ABNORMAL HIGH (ref 11.5–15.5)
WBC: 4.3 10*3/uL (ref 4.0–10.5)
nRBC: 0 % (ref 0.0–0.2)

## 2021-06-14 LAB — POCT URINALYSIS DIPSTICK, ED / UC
Glucose, UA: NEGATIVE mg/dL
Ketones, ur: 15 mg/dL — AB
Nitrite: POSITIVE — AB
Protein, ur: >=300 mg/dL — AB
Specific Gravity, Urine: 1.02 (ref 1.005–1.030)
Urobilinogen, UA: 2 mg/dL — ABNORMAL HIGH (ref 0.0–1.0)
pH: 6 (ref 5.0–8.0)

## 2021-06-14 LAB — POC URINE PREG, ED: Preg Test, Ur: NEGATIVE

## 2021-06-14 LAB — COMPREHENSIVE METABOLIC PANEL
ALT: 14 U/L (ref 0–44)
AST: 21 U/L (ref 15–41)
Albumin: 4 g/dL (ref 3.5–5.0)
Alkaline Phosphatase: 63 U/L (ref 38–126)
Anion gap: 10 (ref 5–15)
BUN: 13 mg/dL (ref 6–20)
CO2: 23 mmol/L (ref 22–32)
Calcium: 9.6 mg/dL (ref 8.9–10.3)
Chloride: 108 mmol/L (ref 98–111)
Creatinine, Ser: 0.71 mg/dL (ref 0.44–1.00)
GFR, Estimated: >60 mL/min (ref >60)
Glucose, Bld: 83 mg/dL (ref 70–99)
Potassium: 4.5 mmol/L (ref 3.5–5.1)
Sodium: 141 mmol/L (ref 135–145)
Total Bilirubin: 0.3 mg/dL (ref 0.3–1.2)
Total Protein: 7.1 g/dL (ref 6.5–8.1)

## 2021-06-14 LAB — HIV ANTIBODY (ROUTINE TESTING W REFLEX): HIV Screen 4th Generation wRfx: NONREACTIVE

## 2021-06-14 MED ORDER — KETOROLAC TROMETHAMINE 30 MG/ML IJ SOLN
INTRAMUSCULAR | Status: AC
  Filled 2021-06-14: qty 1

## 2021-06-14 MED ORDER — CEPHALEXIN 500 MG PO CAPS: 500.0000 mg | ORAL_CAPSULE | Freq: Four times a day (QID) | ORAL | 0 refills | Status: AC

## 2021-06-14 MED ORDER — KETOROLAC TROMETHAMINE 30 MG/ML IJ SOLN
30.0000 mg | Freq: Once | INTRAMUSCULAR | Status: AC
  Administered 2021-06-14: 30 mg via INTRAMUSCULAR

## 2021-06-14 MED ORDER — MEGESTROL ACETATE 40 MG PO TABS: 40.0000 mg | ORAL_TABLET | Freq: Every day | ORAL | 0 refills | Status: DC

## 2021-06-14 NOTE — Discharge Instructions (Addendum)
-   We have given you an injection of Toradol 30 mg today to help with the pain  ?- The urine testing today does show blood in your urine and it looks like you may have a urinary tract infection.  Please start on the Keflex 500 mg every 6 hours for 5 days to help treat this.  We will send the urine off for culture and let you know if it comes back showing that we need to treat with a different antibiotic ?-We will let you know if any of the vaginal or blood testing comes back abnormal ?-Please start the Megace 40 mg daily for vaginal bleeding and make a follow-up visit with your OB/GYN ?

## 2021-06-14 NOTE — ED Provider Notes (Addendum)
?[facility] ? ? ? ?CSN: [id number] ?Arrival date & time: 06/14/21  1002 ? ? ?  ? ?History   ?Chief Complaint ?Chief Complaint  ?Patient presents with  ? Urinary Frequency  ? Abdominal Pain  ? ? ?HPI ?[name] is a 41 y.o. female.  ? ?Patient presents with lower abdominal pain, dysuria, urinary frequency, urgency, voiding smaller amounts, and blood in her urine that has been ongoing since Wednesday.  She denies fevers, nausea/vomiting.  No new back pain.  She is eating and drinking normally. ? ?She also reports vaginal bleeding that recurred Wednesday.  Reports endometrial ablation at the end of March for abnormal uterine bleeding.  She reports she spotted after the ablation for a few weeks, then it stopped for 1 week.  She had her follow-up with an OB/GYN and at that time was not bleeding.  However, the bleeding recurred this Wednesday.  She is frustrated and like to know why she keeps having uterine bleeding. ? ? ?Past Medical History:  ?Diagnosis Date  ? Endometriosis   ? History of 2019 novel coronavirus disease (COVID-19)   ? pt tested positive 03-03-2019, results in care everywhere.  (05-22-2019 per pt had symptoms of fatigue, headache, and loss of taste/ smell;  fatigue and headache after one week and loss taste/ smell resolved after 3 wks)  ? Infertility, female   ? Ovarian cyst   ? Pelvic mass in female   ? large fluid filled pelvic mass  ? Recurrent boils   ? Septate uterus   ? Trichomoniasis   ? ? ?Patient Active Problem List  ? Diagnosis Date Noted  ? Abnormal uterine bleeding (AUB) 05/08/2021  ? Exposure to sexually transmitted disease (STD) 03/06/2021  ? Molluscum contagiosum 12/19/2020  ? Abdominal pain 08/16/2020  ? Constipation 08/16/2020  ? Frequent urination 06/24/2020  ? Hydradenitis 06/24/2020  ? Ganglion cyst of left foot 06/13/2020  ? 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  ? Menorrhagia 12/25/2019  ? Vaginal discharge 07/08/2019  ? GERD (gastroesophageal reflux disease) 06/07/2019  ? HSV-2  seropositive 03/07/2019  ? Hydrosalpinx 12/30/2018  ? Endometriosis 05/01/2011  ? SEPTATE UTERUS 07/24/2009  ? TOBACCO ABUSE 09/26/2008  ? Female infertility 09/26/2008  ? Menometrorrhagia 09/21/2006  ? ? ?Past Surgical History:  ?Procedure Laterality Date  ? BREAST BIOPSY Right 1999  ? per pt benign   ? DILITATION & CURRETTAGE/HYSTROSCOPY WITH NOVASURE ABLATION N/A 05/08/2021  ? Procedure: HYSTEROSCOPY, DILATATION & CURETTAGE, ENDOMETRIAL ABLATION WITH NOVASURE;  Surgeon: Lavonia Drafts, MD;  Location: [facility];  Service: Gynecology;  Laterality: N/A;  ? LAPAROSCOPIC SALPINGOOPHERECTOMY Right 03-25-2010   '@[facility]'$   ? and LYSIS ADHESIONS  ? LAPAROSCOPY N/A 05/30/2019  ? Procedure: DIAGNOSTIC LAPAROSCOPY;  Surgeon: Woodroe Mode, MD;  Location: [facility];  Service: Gynecology;  Laterality: N/A;  ? ? ?OB History   ? ? Gravida  ?0  ? Para  ?   ? Term  ?   ? Preterm  ?   ? AB  ?   ? Living  ?0  ?  ? ? SAB  ?   ? IAB  ?   ? Ectopic  ?   ? Multiple  ?   ? Live Births  ?   ?   ?  ?  ? ? ? ?Home Medications   ? ?Prior to Admission medications   ?Medication Sig Start Date End Date Taking? Authorizing Provider  ?cephALEXin (KEFLEX) 500 MG capsule Take 1 capsule (500 mg total) by  mouth 4 (four) times daily for 5 days. 06/14/21 06/19/21 Yes Eulogio Bear, NP  ?megestrol (MEGACE) 40 MG tablet Take 1 tablet (40 mg total) by mouth daily. 06/14/21  Yes Eulogio Bear, NP  ?acetaminophen (TYLENOL) 500 MG tablet Take 1,000 mg by mouth every 8 (eight) hours as needed for moderate pain.    [provider]  ?diclofenac (VOLTAREN) 75 MG EC tablet Take 1 tablet (75 mg total) by mouth 2 (two) times daily. ?Patient not taking: Reported on 06/04/2021 05/02/21   Geryl Councilman L, PA  ?ibuprofen (ADVIL) 800 MG tablet Take 1 tablet (800 mg total) by mouth every 8 (eight) hours as needed. 06/12/21   Lavonia Drafts, MD  ?tiZANidine (ZANAFLEX) 4 MG tablet Take 1 tablet (4 mg total) by mouth every 6 (six) hours  as needed for muscle spasms. ?Patient not taking: Reported on 06/04/2021 05/02/21   Geryl Councilman L, PA  ?cetirizine (ZYRTEC ALLERGY) 10 MG tablet Take 1 tablet (10 mg total) by mouth daily. ?Patient not taking: Reported on 03/21/2020 10/02/19 05/28/20  Jaynee Eagles, PA-C  ?Norethindrone Acetate-Ethinyl Estrad-FE (LOESTRIN 24 FE) 1-20 MG-MCG(24) tablet Take 1 tablet by mouth daily. 03/21/20 05/28/20  Lavonia Drafts, MD  ?Norgestimate-Ethinyl Estradiol Triphasic (TRI-SPRINTEC) 0.18/0.215/0.25 MG-35 MCG tablet Take 1 tablet by mouth daily. 11/03/10 05/01/11  Woodroe Mode, MD  ? ? ?Family History ?Family History  ?Problem Relation Age of Onset  ? Hypertension Mother   ? Diabetes Mother   ? Cancer Father   ? Hypertension Father   ? Diabetes Brother   ? Hypertension Brother   ? Breast cancer Neg Hx   ? ? ?Social History ?Social History  ? ?Tobacco Use  ? Smoking status: Some Days  ?  Packs/day: 0.25  ?  Years: 15.00  ?  Pack years: 3.75  ?  Types: Cigarettes  ? Smokeless tobacco: Never  ?Vaping Use  ? Vaping Use: Never used  ?Substance Use Topics  ? Alcohol use: Yes  ?  Comment: occasionally- on holidays  ? Drug use: Never  ? ? ? ?Allergies   ?Sulfa antibiotics ? ? ?Review of Systems ?Review of Systems ?Per HPI ? ?Physical Exam ?Triage Vital Signs ?ED Triage Vitals  ?Enc Vitals Group  ?   BP 06/14/21 1018 105/70  ?   Pulse Rate 06/14/21 1018 90  ?   Resp 06/14/21 1018 17  ?   Temp 06/14/21 1018 98.9 ?F (37.2 ?C)  ?   Temp Source 06/14/21 1018 Oral  ?   SpO2 06/14/21 1018 100 %  ?   Weight --   ?   Height --   ?   Head Circumference --   ?   Peak Flow --   ?   Pain Score 06/14/21 1014 8  ?   Pain Loc --   ?   Pain Edu? --   ?   Excl. in [place]? --   ? ?No data found. ? ?Updated Vital Signs ?BP 105/70 (BP Location: Left Arm)   Pulse 90   Temp 98.9 ?F (37.2 ?C) (Oral)   Resp 17   SpO2 100%  ? ?Visual Acuity ?Right Eye Distance:   ?Left Eye Distance:   ?Bilateral Distance:   ? ?Right Eye Near:   ?Left Eye Near:     ?Bilateral Near:    ? ?Physical Exam ?Vitals and nursing note reviewed.  ?Constitutional:   ?   General: She is not in acute distress. ?   Appearance:  She is well-developed. She is not toxic-appearing.  ?Pulmonary:  ?   Effort: Pulmonary effort is normal. No respiratory distress.  ?Abdominal:  ?   General: Abdomen is flat. There is no distension.  ?   Palpations: Abdomen is soft.  ?   Tenderness: There is no abdominal tenderness.  ?Genitourinary: ?   Vagina: No foreign body. Erythema and tenderness present.  ?Skin: ?   General: Skin is warm and dry.  ?   Capillary Refill: Capillary refill takes less than 2 seconds.  ?   Coloration: Skin is not jaundiced or pale.  ?   Findings: No erythema or rash.  ?Neurological:  ?   Mental Status: She is alert and oriented to person, place, and time.  ?Psychiatric:     ?   Behavior: Behavior is cooperative.  ? ? ? ?UC Treatments / Results  ?Labs ?(all labs ordered are listed, but only abnormal results are displayed) ?Labs Reviewed  ?POCT URINALYSIS DIPSTICK, ED / UC - Abnormal; Notable for the following components:  ?    Result Value  ? Bilirubin Urine LARGE (*)   ? Ketones, ur 15 (*)   ? Hgb urine dipstick LARGE (*)   ? Protein, ur >=300 (*)   ? Urobilinogen, UA 2.0 (*)   ? Nitrite POSITIVE (*)   ? Leukocytes,Ua LARGE (*)   ? All other components within normal limits  ?URINE CULTURE  ?CBC  ?COMPREHENSIVE METABOLIC PANEL  ?RPR  ?HIV ANTIBODY (ROUTINE TESTING W REFLEX)  ?POC URINE PREG, ED  ?CERVICOVAGINAL ANCILLARY ONLY  ? ? ?EKG ? ? ?Radiology ?No results found. ? ?Procedures ?Procedures (including critical care time) ? ?Medications Ordered in UC ?Medications  ?ketorolac (TORADOL) 30 MG/ML injection 30 mg (30 mg Intramuscular Given 06/14/21 1142)  ? ? ?Initial Impression / Assessment and Plan / UC Course  ?I have reviewed the triage vital signs and the nursing notes. ? ?Pertinent labs & imaging results that were available during my care of the patient were reviewed by me and  considered in my medical decision making (see chart for details). ? ?  ?UA today is concerning for an acute urinary tract infection, however the urine is quite bloody and therefore the dipstick may be inaccurate.  [place]

## 2021-06-14 NOTE — ED Triage Notes (Signed)
Pt reports had an ablation in March and been having bleeding since. Reports having abd pains and urinary frequency. Pt wanting testing for UTi and STDs.  ?

## 2021-06-15 LAB — URINE CULTURE: Culture: NO GROWTH

## 2021-06-15 LAB — SYPHILIS: RPR W/REFLEX TO RPR TITER AND TREPONEMAL ANTIBODIES, TRADITIONAL SCREENING AND DIAGNOSIS ALGORITHM: RPR Ser Ql: NONREACTIVE

## 2021-06-16 LAB — CERVICOVAGINAL ANCILLARY ONLY
Bacterial Vaginitis (gardnerella): NEGATIVE
Candida Glabrata: NEGATIVE
Candida Vaginitis: NEGATIVE
Chlamydia: NEGATIVE
Comment: NEGATIVE
Comment: NEGATIVE
Comment: NEGATIVE
Comment: NEGATIVE
Comment: NEGATIVE
Comment: NORMAL
Neisseria Gonorrhea: NEGATIVE
Trichomonas: NEGATIVE

## 2021-06-16 NOTE — Telephone Encounter (Signed)
Requested medication (s) are due for refill today: no ? ?Requested medication (s) are on the active medication list: yes ? ?Last refill:  06/14/21 #30/0 ? ?Future visit scheduled: no ? ?Notes to clinic:  pharmacy requesting 90 day supply but pt has new PCP listed ? ? ?  ?Requested Prescriptions  ?Pending Prescriptions Disp Refills  ? megestrol (MEGACE) 40 MG tablet [Pharmacy Med Name: MEGESTROL ACETATE '40MG'$  TABLETS] 90 tablet   ?  Sig: TAKE 1 TABLET(40 MG) BY MOUTH DAILY  ?  ? Not Delegated - OB/GYN:  Progestins - megestrol acetate Failed - 06/14/2021 12:09 PM  ?  ?  Failed - This refill cannot be delegated  ?  ?  Failed - Valid encounter within last 12 months  ?  Recent Outpatient Visits   ?None ?  ?  ? ? ?  ?  ?  Passed - Last BP in normal range  ?  BP Readings from Last 1 Encounters:  ?06/14/21 105/70  ?  ?  ?  ?  ? ?

## 2021-06-17 ENCOUNTER — Telehealth (HOSPITAL_COMMUNITY): Payer: Self-pay | Admitting: Emergency Medicine

## 2021-06-17 NOTE — Telephone Encounter (Signed)
Patient left multiple voicemails after receiving this RNs mychart message instructing her to d/c antibiotics for UTI from visit.  Patient expressed her frustration with the medication being prescribed "that I didn't need".  Reviewed with Janett Billow, APP who saw patient this visit and she states urinalysis and symptoms indicated probably UTI.  Attempted to reach patient to review and discuss concerns, LVM ? ?

## 2021-06-18 ENCOUNTER — Encounter: Payer: Self-pay | Admitting: Obstetrics & Gynecology

## 2021-06-18 ENCOUNTER — Ambulatory Visit: Payer: Commercial Managed Care - HMO | Admitting: Obstetrics & Gynecology

## 2021-06-18 VITALS — BP 117/77 | HR 81 | Wt 172.0 lb

## 2021-06-18 DIAGNOSIS — N939 Abnormal uterine and vaginal bleeding, unspecified: Secondary | ICD-10-CM | POA: Diagnosis not present

## 2021-06-18 NOTE — Progress Notes (Signed)
History:  ?41 y.o. G0P0 here today for eval of bleeding. Pt reports that she is passing large clots. She had a hysteroscopy on 05/08/2021.  She reports that she is passing large clots and has been dizzy. She was seen at urgent care and told that it could be her fibroids.   ? ?The following portions of the patient's history were reviewed and updated as appropriate: allergies, current medications, past family history, past medical history, past social history, past surgical history and problem list. ? ?Review of Systems:  ?Pertinent items are noted in HPI. ?   ?Objective:  ?Physical Exam ?Blood pressure 117/77, pulse 81, weight 172 lb (78 kg). ? ?CONSTITUTIONAL: Well-developed, well-nourished female in no acute distress.  ?HENT:  Normocephalic, atraumatic ?EYES: Conjunctivae and EOM are normal. No scleral icterus.  ?NECK: Normal range of motion ?SKIN: Skin is warm and dry. No rash noted. Not diaphoretic.No pallor. ?[place]: Alert and oriented to person, place, and time. Normal coordination.  ?Abd: Soft, nontender and nondistended ?Pelvic: Normal appearing external genitalia; normal appearing vaginal mucosa and cervix.  Normal discharge.  Very small amount of blood in vault. Dark blood. Small uterus, no other palpable masses, no uterine or adnexal tenderness ? ?Labs and Imaging ?MM 3D SCREEN BREAST BILATERAL ? ?Result Date: 06/03/2021 ?CLINICAL DATA:  Screening. EXAM: DIGITAL SCREENING BILATERAL MAMMOGRAM WITH TOMOSYNTHESIS AND CAD TECHNIQUE: Bilateral screening digital craniocaudal and mediolateral oblique mammograms were obtained. Bilateral screening digital breast tomosynthesis was performed. The images were evaluated with computer-aided detection. COMPARISON:  Previous exam(s). ACR Breast Density Category c: The breast tissue is heterogeneously dense, which may obscure small masses. FINDINGS: There are no findings suspicious for malignancy. IMPRESSION: No mammographic evidence of malignancy. A result letter of this  screening mammogram will be mailed directly to the patient. RECOMMENDATION: Screening mammogram in one year. (Code:SM-B-01Y) BI-RADS CATEGORY  1: Negative. Electronically Signed   By: Lovey Newcomer M.D.   On: 06/03/2021 09:35   ? ? ?  Latest Ref Rng & Units 06/14/2021  ? 11:01 AM 05/08/2021  ?  7:06 AM 03/13/2021  ?  2:00 PM  ?CBC  ?WBC 4.0 - 10.5 K/uL 4.3   6.2     ?Hemoglobin 12.0 - 15.0 g/dL 13.3   13.1   11.0    ?Hematocrit 36.0 - 46.0 % 40.0   38.8     ?Platelets 150 - 400 K/uL 361   391     ?  ?Assessment & Plan:  ?AUB; Pt is S/p Ablation. Pt is still within the window of normal post op bleeding. Her Hgb is stable and improved from post surgery.  ?Pt reassured.  ?Was given Megace at urgent care. Pt may take one daily for 1 month. ?F/u in 6 weeks or sooner prn  ? ?Natalina Wieting L. Harraway-Smith, M.D., [place] ? ?   ? ?

## 2021-06-30 ENCOUNTER — Ambulatory Visit (INDEPENDENT_AMBULATORY_CARE_PROVIDER_SITE_OTHER): Payer: Commercial Managed Care - HMO | Admitting: Family Medicine

## 2021-06-30 ENCOUNTER — Encounter: Payer: Self-pay | Admitting: Family Medicine

## 2021-06-30 ENCOUNTER — Other Ambulatory Visit: Payer: Self-pay

## 2021-06-30 VITALS — BP 120/75 | HR 91 | Wt 171.2 lb

## 2021-06-30 DIAGNOSIS — N939 Abnormal uterine and vaginal bleeding, unspecified: Secondary | ICD-10-CM

## 2021-06-30 NOTE — Patient Instructions (Addendum)
It was wonderful to see you today.  Please bring ALL of your medications with you to every visit.   Today we talked about:  Continued bleeding. We scheduled you for an ultrasound; go to this test with a full bladder. Please follow up with your OBGYN.   Please call the clinic at [phone] if your symptoms worsen or you have any concerns. It was our pleasure to serve you.  Dr. Janus Molder

## 2021-06-30 NOTE — Progress Notes (Unsigned)
    SUBJECTIVE:   CHIEF COMPLAINT / HPI:   Uterine bleeding Hx of ablation 3/30 and fibroids. GYN appt 5/10, told to continue megace and follow up in 6 weeks or sooner if needed. Bleeding thought to be post surgical by OBGYN. She is here today because she wants to know why she is having daily uterine bleeding. She states she was told a polyp was removed and that was the source of the bleeding but wants to know if the source is gone why does it continue. She states she has asked these questions and was told the next step is a hysterectomy. She denies light headedness and dizziness at this time and endorses moderate uterine bleeding. She is taking the megace and admits to the bleeding slowing up somewhat.   PERTINENT  PMH / PSH: G0P0, Hx of infertility, hx of BV, yeast, trichomonas  OBJECTIVE:   BP 120/75   Pulse 91   Wt 171 lb 3.2 oz (77.7 kg)   SpO2 100%   BMI 31.31 kg/m   General: Appears well, no acute distress. Age appropriate. Respiratory: normal effort Abdomen: soft, nontender, nondistended Skin: Warm and dry, right inguinal erythema Neuro: alert and oriented Psych: normal affect  ASSESSMENT/PLAN:   Uterine bleeding Chronic ongoing since January and now s/p ablation 05/08/21. Reviewed most recent OBGYN visit. Reviewed [place] prior to ablation, evidence of small submucosal and intramural fibroid. Endometrium not thickened at this time. The patient initially asked for a pap smear. She is UTD on this and explained that this would not allow [place] to see in the uterus and it screens for cervical cancer. Discussed that repeat US may not yield more answers but she requested repeat imaging. She does not desire a hysterectomy at this time; hx of infertility. I discussed this with Dr. Andria Frames who agreed that we can go ahead an order. The [place] was scheduled prior to the end of the visit.   Upon entering the room to end the visit the patient stated she had a boil in her inguinal area that she desired  antibiotics for. I did explain that we were at the end of the visit and I would like to schedule a follow up so that we can dedicate more time to this next issue to properly treat it. She attempted to show the me the area but I was unable to fully appreciate the area and further exposure of the area would have warranted a chaperone. I did offer clindamycin pledget wipes until she could return for follow up and explained oral antibiotic would not be appropriate at this time. She stated, "I came here for nothing." I did explained we discussed uterine bleeding and planned to get the US imaging. She was unsatisfied with this and states she wanted to be seen by someone else. I stated I would go get my attending. I left the room to get Dr. Andria Frames and upon returning the patient had left the exam room and the office building. In the future we will gladly see this patient again and attempt to help her during that time.  - US Pelvic Complete With Transvaginal; Future  Gerlene Fee, [place]

## 2021-07-02 ENCOUNTER — Ambulatory Visit (HOSPITAL_COMMUNITY)
Admission: RE | Admit: 2021-07-02 | Discharge: 2021-07-02 | Disposition: A | Discharge status: Home / Self Care | Payer: Commercial Managed Care - HMO | Source: Ambulatory Visit | Attending: Family Medicine | Admitting: Family Medicine

## 2021-07-02 DIAGNOSIS — N939 Abnormal uterine and vaginal bleeding, unspecified: Secondary | ICD-10-CM | POA: Diagnosis present

## 2021-07-03 ENCOUNTER — Telehealth: Payer: Self-pay | Admitting: Family Medicine

## 2021-07-03 NOTE — Telephone Encounter (Signed)
I was the preceptor for this visit.  Dr. Janus Molder made me aware of the patient's dissatisfaction.  I intended to speak with her and found she had left abruptly before I had the chance. In my precepting, I was fully aware of the medical decision--making around the mennorrhagia and obtaining the ultrasound.  I am also aware of the patient's request for antibiotics for a boil as the visit was concluding.  I support Dr. Azalia Bilis medical decision making.   [place]

## 2021-07-03 NOTE — Telephone Encounter (Signed)
Attempted to call patient about [place] results. Has follow up scheduled with OBGYN and this is what I recommend. LVM asking patient to call back. I will also send a mychart message.   [place], DO 07/03/2021, 10:02 AM PGY-3, [place]

## 2021-07-03 NOTE — Telephone Encounter (Signed)
Received call back from patient to discuss results. I made her aware of ovarian mass and need to follow up. She continues to desire to know where the bleeding in coming from which is understandable. We discussed the possible hemorraghic cyst and/or fibroids as a source. She is stating she still has a polyp. I am unsure what she is referring to as a polyp. I did explain the things that are looked for on a pelvic US. I encouraged her to follow up with her OBGYN. She asked me to send a message to her GYN doctor in which have.   She voiced that she was not please with her care in her last visit, does not want to see the provider again and that she plans to make a complaint with our office manager Janett Billow. I voiced understanding. I also made her aware that I was the provider who saw her and apologized if she feels as if she was not treated adequately.   She stated she needs an antibiotic for her boil. I explained that it is not good medical care to prescribe antibiotics without knowing what we are treating. I encouraged her to make another office visit to be assessed for this. She said that she would.   Dierre Crevier Autry-Lott, DO 07/03/2021, 10:41 AM PGY-3, [place]

## 2021-07-07 ENCOUNTER — Telehealth: Payer: Self-pay | Admitting: Family Medicine

## 2021-07-07 NOTE — Telephone Encounter (Signed)
Error

## 2021-07-09 ENCOUNTER — Telehealth (INDEPENDENT_AMBULATORY_CARE_PROVIDER_SITE_OTHER): Payer: Commercial Managed Care - HMO | Admitting: Obstetrics & Gynecology

## 2021-07-09 ENCOUNTER — Encounter: Payer: Self-pay | Admitting: Obstetrics & Gynecology

## 2021-07-09 DIAGNOSIS — N939 Abnormal uterine and vaginal bleeding, unspecified: Secondary | ICD-10-CM

## 2021-07-09 MED ORDER — MEGESTROL ACETATE 40 MG PO TABS: 40.0000 mg | ORAL_TABLET | Freq: Two times a day (BID) | ORAL | 3 refills | Status: DC

## 2021-07-09 NOTE — Progress Notes (Signed)
GYNECOLOGY VIRTUAL VISIT ENCOUNTER NOTE  Provider location: [facility] [place] [facility]   Patient location: Home  I connected with [name] on 07/09/21 at  1:50 PM EDT by MyChart Video Encounter and verified that I am speaking with the correct person using two identifiers.   I discussed the limitations, risks, security and privacy concerns of performing an evaluation and management service virtually and the availability of in person appointments. I also discussed with the patient that there may be a patient responsible charge related to this service. The patient expressed understanding and agreed to proceed.   History:  [name] is a 41 y.o. G0P0 female being evaluated today for AUB. Pt requests hysterectomy. She has her mother were present on the call. The initial call was virtual then the connection was lost so was transferred to a call. She reports cont'd abnormal bleeding since the ablation.  She denies pelvic pain or other concerns.    Her initial surgery with Dr. Roselie Awkward revealed pelvic adhesions. He told pt that she could 'end up with a colostomy if she proceeded with a hysterectomy.'      Past Medical History:  Diagnosis Date   Endometriosis    History of 2019 novel coronavirus disease (COVID-19)    pt tested positive 03-03-2019, results in care everywhere.  (05-22-2019 per pt had symptoms of fatigue, headache, and loss of taste/ smell;  fatigue and headache after one week and loss taste/ smell resolved after 3 wks)   Infertility, female    Ovarian cyst    Pelvic mass in female    large fluid filled pelvic mass   Recurrent boils    Septate uterus    Trichomoniasis    Past Surgical History:  Procedure Laterality Date   BREAST BIOPSY Right 1999   per pt benign    DILITATION & CURRETTAGE/HYSTROSCOPY WITH NOVASURE ABLATION N/A 05/08/2021   Procedure: HYSTEROSCOPY, [place], ENDOMETRIAL ABLATION WITH NOVASURE;  Surgeon:  Lavonia Drafts, MD;  Location: [place] [facility];  Service: Gynecology;  Laterality: N/A;   LAPAROSCOPIC SALPINGOOPHERECTOMY Right 03-25-2010   '@[facility]'$    and LYSIS ADHESIONS   LAPAROSCOPY N/A 05/30/2019   Procedure: DIAGNOSTIC LAPAROSCOPY;  Surgeon: Woodroe Mode, MD;  Location: [facility];  Service: Gynecology;  Laterality: N/A;   The following portions of the patient's history were reviewed and updated as appropriate: allergies, current medications, past family history, past medical history, past social history, past surgical history and problem list.    Review of Systems:  Pertinent items noted in HPI and remainder of comprehensive ROS otherwise negative.  Physical Exam:   General:  Alert, oriented and cooperative. Patient appears to be in no acute distress.  Mental Status: Normal mood and affect. Normal behavior. Normal judgment and thought content.   Respiratory: Normal respiratory effort, no problems with respiration noted  Rest of physical exam deferred due to type of encounter  Labs and Imaging No results found for this or any previous visit (from the past 336 hour(s)). US Pelvic Complete With Transvaginal  Result Date: 07/02/2021 CLINICAL DATA:  Pelvic bleeding EXAM: TRANSABDOMINAL AND TRANSVAGINAL ULTRASOUND OF PELVIS TECHNIQUE: Both transabdominal and transvaginal ultrasound examinations of the pelvis were performed. Transabdominal technique was performed for global imaging of the pelvis including uterus, ovaries, adnexal regions, and pelvic cul-de-sac. It was necessary to proceed with endovaginal exam following the transabdominal exam to visualize the uterus endometrium ovaries. COMPARISON:  Ultrasound 03/24/2021, 08/09/2020 FINDINGS: Uterus Measurements:  6.9 x 4.5 x 6.3 cm = volume: 100.7 mL. Multiple uterine fibroids. Right uterine fundal fibroid measures 22 x 19 x 23 mm and extends submucosal. Left uterine fibroid measures 3.1 x 3 x 2.7 cm. Small posterior intramural  fibroid measuring 10 x 7 by 9 mm. Endometrium Thickness: 11.6 mm.  No focal abnormality visualized. Right ovary Surgically absent. Fluid echogenicity structure at the right adnexa, suspected to represent hydrosalpinx. This is present on previous exams. Left ovary Measurements: 5.4 x 3 x 4.3 cm = volume: 37.3 mL. Complex mass measuring 4.3 x 2.3 by 3.3 cm contains internal echoes and small cystic spaces. Other findings No abnormal free fluid. IMPRESSION: 1. Multiple uterine fibroids some of which extend submucosal. Endometrial thickness within normal limits for premenopausal patient 2. Surgically absent right ovary. Large fluid echogenicity structure at the right adnexa slightly tubular in configuration and suspected to represent hydrosalpinx 3. 4.3 cm complex left ovarian mass, possible hemorrhagic cyst or endometrioma. Suggest 6-12 week sonographic follow-up Electronically Signed   By: Donavan Foil M.D.   On: 07/02/2021 17:37       Assessment and Plan:     AUB- s/p endometrial ablation. Reviewed hysterectomy options including complications in detail.       Pt is hesitant about surgery given the side effects.    Agrees to try conservative option.  Megace '40mg'$  bid    F/u in 8 weeks or sooner    F/u US in 6-8 weeks. Prior to next visit.        I discussed the assessment and treatment plan with the patient. The patient was provided an opportunity to ask questions and all were answered. The patient agreed with the plan and demonstrated an understanding of the instructions.   The patient was advised to call back or seek an in-person evaluation/go to the ED if the symptoms worsen or if the condition fails to improve as anticipated.  I provided 30 minutes of face-to-face time during this encounter.   Lavonia Drafts, MD Center for [employer], [place]

## 2021-07-09 NOTE — Progress Notes (Signed)
Patient had ultrasound on 07/02/2021- patient requests you review. Kathrene Alu RN

## 2021-07-10 ENCOUNTER — Encounter: Payer: Self-pay | Admitting: General Practice

## 2021-07-10 ENCOUNTER — Encounter: Payer: Self-pay | Admitting: Student

## 2021-07-10 ENCOUNTER — Other Ambulatory Visit: Payer: Self-pay

## 2021-07-10 MED ORDER — CLINDAMYCIN PHOSPHATE 1 % EX GEL: Freq: Two times a day (BID) | CUTANEOUS | 0 refills | Status: DC

## 2021-07-11 NOTE — Telephone Encounter (Signed)
Attempted to call pt x4.  07/02/21 @ 4:30 - LMOVM 07/03/21 07/11/21 - 1st attempt pt could not hear me and hung up               2nd attempt - straight to VM  Will await callback Christen Bame, CMA

## 2021-07-14 ENCOUNTER — Ambulatory Visit (HOSPITAL_COMMUNITY): Payer: Commercial Managed Care - HMO

## 2021-07-15 ENCOUNTER — Encounter: Payer: Self-pay | Admitting: *Deleted

## 2021-07-31 ENCOUNTER — Ambulatory Visit (HOSPITAL_COMMUNITY): Payer: Self-pay

## 2021-08-04 ENCOUNTER — Encounter: Payer: Self-pay | Admitting: Obstetrics & Gynecology

## 2021-08-04 ENCOUNTER — Encounter: Payer: Self-pay | Admitting: Student

## 2021-08-05 ENCOUNTER — Other Ambulatory Visit: Payer: Self-pay | Admitting: Student

## 2021-08-05 DIAGNOSIS — L732 Hidradenitis suppurativa: Secondary | ICD-10-CM

## 2021-08-05 NOTE — Progress Notes (Signed)
Referral placed for dermatology per patient request for hidradenitis suppurativa

## 2021-08-06 ENCOUNTER — Ambulatory Visit: Payer: Managed Care, Other (non HMO) | Admitting: Obstetrics & Gynecology

## 2021-08-15 ENCOUNTER — Other Ambulatory Visit: Payer: Self-pay | Admitting: Obstetrics & Gynecology

## 2021-08-15 DIAGNOSIS — N939 Abnormal uterine and vaginal bleeding, unspecified: Secondary | ICD-10-CM

## 2021-08-15 MED ORDER — NORETHINDRONE ACETATE 5 MG PO TABS: 5.0000 mg | ORAL_TABLET | Freq: Every day | ORAL | 2 refills | Status: DC

## 2021-08-20 ENCOUNTER — Ambulatory Visit (HOSPITAL_COMMUNITY)
Admission: RE | Admit: 2021-08-20 | Discharge: 2021-08-20 | Disposition: A | Discharge status: Home / Self Care | Payer: Commercial Managed Care - HMO | Source: Ambulatory Visit | Attending: Obstetrics & Gynecology | Admitting: Obstetrics & Gynecology

## 2021-08-20 ENCOUNTER — Other Ambulatory Visit: Payer: Self-pay | Admitting: Obstetrics & Gynecology

## 2021-08-20 ENCOUNTER — Ambulatory Visit: Payer: Commercial Managed Care - HMO | Admitting: Obstetrics & Gynecology

## 2021-08-20 DIAGNOSIS — N939 Abnormal uterine and vaginal bleeding, unspecified: Secondary | ICD-10-CM | POA: Insufficient documentation

## 2021-08-27 ENCOUNTER — Ambulatory Visit: Payer: Commercial Managed Care - HMO | Admitting: Student

## 2021-08-27 ENCOUNTER — Encounter: Payer: Self-pay | Admitting: Obstetrics & Gynecology

## 2021-08-27 ENCOUNTER — Ambulatory Visit: Payer: Commercial Managed Care - HMO | Admitting: Obstetrics & Gynecology

## 2021-08-27 VITALS — BP 112/76 | HR 91 | Wt 174.0 lb

## 2021-08-27 DIAGNOSIS — F4323 Adjustment disorder with mixed anxiety and depressed mood: Secondary | ICD-10-CM

## 2021-08-27 DIAGNOSIS — N939 Abnormal uterine and vaginal bleeding, unspecified: Secondary | ICD-10-CM

## 2021-08-27 NOTE — Patient Instructions (Signed)
Total Laparoscopic Hysterectomy A total laparoscopic hysterectomy is a minimally invasive surgery to remove the uterus and cervix. The fallopian tubes and ovaries can also be removed during this surgery, if necessary. This procedure may be done to treat problems such as: Growths in the uterus (uterine fibroids) that are not cancer but cause symptoms. A condition that causes the lining of the uterus to grow in other areas (endometriosis). Problems with pelvic support. Cancer of the cervix, ovaries, uterus, or tissue that lines the uterus (endometrium). Excessive bleeding in the uterus. After this procedure, you will no longer be able to have a baby, and you will no longer have a menstrual period. Tell a health care provider about: Any allergies you have. All medicines you are taking, including vitamins, herbs, eye drops, creams, and over-the-counter medicines. Any problems you or family members have had with anesthetic medicines. Any blood disorders you have. Any surgeries you have had. Any medical conditions you have. Whether you are pregnant or may be pregnant. What are the risks? Generally, this is a safe procedure. However, problems may occur, including: Infection. Bleeding. Blood clots in the legs or lungs. Allergic reactions to medicines. Damage to nearby structures or organs. Having to change from this surgery to one in which a large incision is made in the abdomen (abdominal hysterectomy). What happens before the procedure? Staying hydrated Follow instructions from your health care provider about hydration, which may include: Up to 2 hours before the procedure - you may continue to drink clear liquids, such as water, clear fruit juice, black coffee, and plain tea.  Eating and drinking restrictions Follow instructions from your health care provider about eating and drinking, which may include: 8 hours before the procedure - stop eating heavy meals or foods, such as meat, fried  foods, or fatty foods. 6 hours before the procedure - stop eating light meals or foods, such as toast or cereal. 6 hours before the procedure - stop drinking milk or drinks that contain milk. 2 hours before the procedure - stop drinking clear liquids. Medicines Ask your health care provider about: Changing or stopping your regular medicines. This is especially important if you are taking diabetes medicines or blood thinners. Taking medicines such as aspirin and ibuprofen. These medicines can thin your blood. Do not take these medicines unless your health care provider tells you to take them. Taking over-the-counter medicines, vitamins, herbs, and supplements. You may be asked to take medicine that helps you have a bowel movement (laxative) to prevent constipation. General instructions If you were asked to do bowel preparation before the procedure, follow instructions from your health care provider. This procedure can affect the way you feel about yourself. Talk with your health care provider about the physical and emotional changes hysterectomy may cause. Do not use any products that contain nicotine or tobacco for at least 4 weeks before the procedure. These products include cigarettes, chewing tobacco, and vaping devices, such as e-cigarettes. If you need help quitting, ask your health care provider. Plan to have a responsible adult take you home from the hospital or clinic. Plan to have a responsible adult care for you for the time you are told after you leave the hospital or clinic. This is important. Surgery safety Ask your health care provider: How your surgery site will be marked. What steps will be taken to help prevent infection. These may include: Removing hair at the surgery site. Washing skin with a germ-killing soap. Receiving antibiotic medicine. What happens during the  procedure? An IV will be inserted into one of your veins. You will be given one or more of the following: A  medicine to help you relax (sedative). A medicine to make you fall asleep (general anesthetic). A medicine to numb the area (local anesthetic). A medicine that is injected into your spine to numb the area below and slightly above the injection site (spinal anesthetic). A medicine that is injected into an area of your body to numb everything below the injection site (regional anesthetic). A gas will be used to inflate your abdomen. This will allow your surgeon to look inside your abdomen and do the surgery. Three or four small incisions will be made in your abdomen. A small device with a light (laparoscope) will be inserted into one of your incisions. Surgical instruments will be inserted through the other incisions in order to perform the procedure. Your uterus and cervix may be removed through your vagina or cut into small pieces and removed through the small incisions. Any other organs that need to be removed will also be removed this way. The gas will be released from inside your abdomen. Your incisions will be closed with stitches (sutures), skin glue, or adhesive strips. A bandage (dressing) may be placed over your incisions. The procedure may vary among health care providers and hospitals. What happens after the procedure? Your blood pressure, heart rate, breathing rate, and blood oxygen level will be monitored until you leave the hospital or clinic. You will be given medicine for pain as needed. You will be encouraged to walk as soon as possible. You will also use a device to help you breathe or do breathing exercises to keep your lungs clear. You may have to wear compression stockings. These stockings help to prevent blood clots and reduce swelling in your legs. You will need to wear a sanitary pad for vaginal discharge or bleeding. Summary Total laparoscopic hysterectomy is a procedure to remove your uterus, cervix, and sometimes the fallopian tubes and ovaries. This procedure can  affect the way you feel about yourself. Talk with your health care provider about the physical and emotional changes hysterectomy may cause. After this procedure, you will no longer be able to have a baby, and you will no longer have a menstrual period. You will be given pain medicine to control discomfort after this procedure. Plan to have a responsible adult take you home from the hospital or clinic. This information is not intended to replace advice given to you by your health care provider. Make sure you discuss any questions you have with your health care provider. Document Revised: 09/29/2019 Document Reviewed: 09/29/2019 Elsevier Patient Education  [place]. Total Laparoscopic Hysterectomy, Care After The following information offers guidance on how to care for yourself after your procedure. Your health care provider may also give you more specific instructions. If you have problems or questions, contact your health care provider. What can I expect after the procedure? After the procedure, it is common to have: Pain, bruising, and numbness around your incisions. Tiredness (fatigue). Poor appetite. Less interest in sex. Vaginal discharge or bleeding. You will need to use a sanitary pad after this procedure. Feelings of sadness or other emotions. If your ovaries were also removed, it is also common to have symptoms of menopause, such as hot flashes, night sweats, and lack of sleep (insomnia). Follow these instructions at home: Medicines Take over-the-counter and prescription medicines only as told by your health care provider. Ask your health care  provider if the medicine prescribed to you: Requires you to avoid driving or using machinery. Can cause constipation. You may need to take these actions to prevent or treat constipation: Drink enough fluid to keep your urine pale yellow. Take over-the-counter or prescription medicines. Eat foods that are high in fiber, such as  beans, whole grains, and fresh fruits and vegetables. Limit foods that are high in fat and processed sugars, such as fried or sweet foods. Incision care  Follow instructions from your health care provider about how to take care of your incisions. Make sure you: Wash your hands with soap and water for at least 20 seconds before and after you change your bandage (dressing). If soap and water are not available, use hand sanitizer. Change your dressing as told by your health care provider. Leave stitches (sutures), skin glue, or adhesive strips in place. These skin closures may need to stay in place for 2 weeks or longer. If adhesive strip edges start to loosen and curl up, you may trim the loose edges. Do not remove adhesive strips completely unless your health care provider tells you to do that. Check your incision areas every day for signs of infection. Check for: More redness, swelling, or pain. Fluid or blood. Warmth. Pus or a bad smell. Activity  Rest as told by your health care provider. Avoid sitting for a long time without moving. Get up to take short walks every 1-2 hours. This is important to improve blood flow and breathing. Ask for help if you feel weak or unsteady. Return to your normal activities as told by your health care provider. Ask your health care provider what activities are safe for you. Do not lift anything that is heavier than 10 lb (4.5 kg), or the limit that you are told, for one month after surgery or until your health care provider says that it is safe. If you were given a sedative during the procedure, it can affect you for several hours. Do not drive or operate machinery until your health care provider says that it is safe. Lifestyle Do not use any products that contain nicotine or tobacco. These products include cigarettes, chewing tobacco, and vaping devices, such as e-cigarettes. These can delay healing after surgery. If you need help quitting, ask your health care  provider. Do not drink alcohol until your health care provider approves. General instructions  Do not douche, use tampons, or have sex for at least 6 weeks, or as told by your health care provider. If you struggle with physical or emotional changes after your procedure, speak with your health care provider or a therapist. Do not take baths, swim, or use a hot tub until your health care provider approves. You may only be allowed to take showers for 2-3 weeks. Keep your dressing dry until your health care provider says it can be removed. Try to have someone at home with you for the first 1-2 weeks to help with your daily chores. Wear compression stockings as told by your health care provider. These stockings help to prevent blood clots and reduce swelling in your legs. Keep all follow-up visits. This is important. Contact a health care provider if: You have any of these signs of infection: Chills or a fever. More redness, swelling, or pain around an incision. Fluid or blood coming from an incision. Warmth coming from an incision. Pus or a bad smell coming from an incision. An incision opens. You feel dizzy or light-headed. You have pain or bleeding  when you urinate, or you are unable to urinate. You have abnormal vaginal discharge. You have pain that does not get better with medicine. Get help right away if: You have a fever and your symptoms suddenly get worse. You have severe abdominal pain. You have chest pain or shortness of breath. You faint. You have pain, swelling, or redness in your leg. You have heavy vaginal bleeding with blood clots, soaking through a sanitary pad in less than 1 hour. These symptoms may represent a serious problem that is an emergency. Do not wait to see if the symptoms will go away. Get medical help right away. Call your local emergency services (911 in the [place] Do not drive yourself to the hospital. Summary After the procedure, it is common to have pain  and bruising around your incisions. Do not take baths, swim, or use a hot tub until your health care provider approves. Do not lift anything that is heavier than 10 lb (4.5 kg), or the limit that you are told, for one month after surgery or until your health care provider says that it is safe. Tell your health care provider if you have any signs or symptoms of infection after the procedure. Get help right away if you have severe abdominal pain, chest pain, shortness of breath, or heavy bleeding from your vagina. This information is not intended to replace advice given to you by your health care provider. Make sure you discuss any questions you have with your health care provider. Document Revised: 09/29/2019 Document Reviewed: 09/29/2019 Elsevier Patient Education  [place].

## 2021-08-27 NOTE — Progress Notes (Signed)
History:  41 y.o. G0P0 here today for f/u of AUB. Pt reports continued daily bleeding despite conservative measures with endometrial ablation and meds. Her last attempt of meds was Megace for 1 month and OCPs for 2 weeks. Pt s hesitant to take meds. She reports crying and depression due to the bleeding. She has been resistant to surgery due to fear of the potential complications of surgery. Namely fear of bowel injury and potential for colostomy.  Pt has been reading a lot online.  The following portions of the patient's history were reviewed and updated as appropriate: allergies, current medications, past family history, past medical history, past social history, past surgical history and problem list.  Review of Systems:  Pertinent items are noted in HPI.    Objective:  Physical Exam BP 112/76   Pulse 91   Wt 174 lb (78.9 kg)   BMI 31.83 kg/m   CONSTITUTIONAL: Well-developed, well-nourished female in no acute distress.  HENT:  Normocephalic, atraumatic EYES: Conjunctivae and EOM are normal. No scleral icterus.  NECK: Normal range of motion SKIN: Skin is warm and dry. No rash noted. Not diaphoretic.No pallor. [place]: Alert and oriented to person, place, and time. Normal coordination.  Pelvic: not repeated  Labs and Imaging US PELVIC COMPLETE WITH TRANSVAGINAL  Result Date: 08/20/2021 CLINICAL DATA:  Ovarian cyst, bleeding, prior endometrial ablation EXAM: TRANSABDOMINAL AND TRANSVAGINAL ULTRASOUND OF PELVIS TECHNIQUE: Both transabdominal and transvaginal ultrasound examinations of the pelvis were performed. Transabdominal technique was performed for global imaging of the pelvis including uterus, ovaries, adnexal regions, and pelvic cul-de-sac. It was necessary to proceed with endovaginal exam following the transabdominal exam to visualize the endometrium and ovaries. COMPARISON:  07/02/2021 FINDINGS: Uterus Measurements: 8.9 x 4.6 x 6.4 cm = volume: 136 mL. Anteverted. Two large  leiomyomata at upper and mid uterus, both submucosal, measuring 3.2 cm and 3.8 cm in greatest sizes. Additional small intramural leiomyoma at upper uterus 5 mm diameter. Endometrium Thickness: 7 mm.  No endometrial fluid or mass Right ovary Not visualized, likely obscured by bowel Left ovary Measurements: 4.0 x 1.7 x 3.9 cm = volume: 13.6 mL. Hemorrhagic cyst LEFT ovary, 3.3 cm greatest diameter, decreased from 4.3 cm on prior study; no follow-up imaging recommended. Other findings No free pelvic fluid or adnexal masses. Tubular structure identified in RIGHT adnexa 1.7 cm diameter consistent with segment of hydrosalpinx. IMPRESSION: Post RIGHT oophorectomy with segment of RIGHT hydrosalpinx noted. Two submucosal leiomyomata at mid to upper uterus. Electronically Signed   By: Lavonia Dana M.D.   On: 08/20/2021 13:28    Assessment & Plan:  Diagnoses and all orders for this visit:  Abnormal uterine bleeding (AUB)  Adjustment reaction with anxiety and depression -     Ambulatory referral to [place]   Patient desires surgical management with [facility] with bilateral salpingectomy.  The risks of surgery were discussed in detail with the patient including but not limited to: bleeding which may require transfusion or reoperation; infection which may require prolonged hospitalization or re-hospitalization and antibiotic therapy; injury to bowel, bladder, ureters and major vessels or other surrounding organs; need for additional procedures including laparotomy; thromboembolic phenomenon, incisional problems and other postoperative or anesthesia complications. I have explained to pt and her mother that the risks of complications will not change. They are low but, they will never go to zero. I have also reviewed that because of her risk of prev known pelvic adhesions, she is at increased risk of injury. Patient was  told that the likelihood that her condition and symptoms will be treated effectively with  this surgical management was very high; the postoperative expectations were also discussed in detail. The patient also understands the alternative treatment options which were discussed in full. All questions were answered. I have informed her that she can seek another opinion. She was told that she will be contacted by our surgical scheduler regarding the time and date of her surgery; routine preoperative instructions of having nothing to eat or drink after midnight on the day prior to surgery and also coming to the hospital 1 1/2 hours prior to her time of surgery were also emphasized.  She was told she may be called for a preoperative appointment about a week prior to surgery and will be given further preoperative instructions at that visit. Printed patient education handouts about the procedure were given to the patient to review at home.   Given pts anxiety I have offered her an eval by [facility]. She accepted the referral.   Total face-to-face time with patient, review of chart, discussion with consultant and coordination of care was 55mn.    Megham Dwyer L. Harraway-Smith, M.D., FCherlynn June

## 2021-08-28 ENCOUNTER — Telehealth: Payer: Self-pay | Admitting: Clinical

## 2021-08-28 NOTE — Telephone Encounter (Signed)
Attempt call regarding referral; Left HIPPA-compliant message to call back [place] from [facility] at  [phone] (Bernette Seeman's office).    

## 2021-08-30 NOTE — Progress Notes (Unsigned)
    SUBJECTIVE:   CHIEF COMPLAINT / HPI:   STD testing Pt desires STD testing to be safe. She smells a fishy odor and would also like to be tested for BV. No thick/chunky discharge. Does have pelvic pain but thinks this is d/t vaginal bleeding.  Was tested for STDs back in May, states she has not had sex since May but she would still like to be tested for gonorrhea, chlamydia, trichomonas and have a wet prep checked.  AUB Patient seen by OB/GYN on 08/27/2021 for AUB.  Bleeding has continued despite endometrial ablation medications.  Has completed Megace for a month and was on birth control pills for 2 weeks.  She is scheduled for hysterectomy with salpingectomy on 11/11/2021  Fatigue Patient admits to chronic fatigue. Denies dry skin, admits to palpitations, weight gain, and insomnia which she contributes to anxiety.  PERTINENT  PMH / PSH: AUB  OBJECTIVE:   Vitals:   09/01/21 0824  BP: 107/78  Pulse: 95  SpO2: 100%     General: NAD, pleasant, able to participate in exam Cardiac: RRR, no murmurs. Respiratory: CTAB, normal effort, No wheezes, rales or rhonchi Abdomen: Bowel sounds present, mild lower abdominal tenderness with no guarding, soft, nondistended Extremities: no edema  Skin: warm and dry, no rashes noted GU: Female chaperone.  Normal female external vaginal genitalia with no lesions, ulcers, warts, pink vaginal mucosa with cervical bleeding present, scant clear discharge, normal-appearing cervix with no lesions, masses or ulcers Neuro: alert, no obvious focal deficits Psych: Normal affect and mood  ASSESSMENT/PLAN:   AUB Patient had several questions about future hysterectomy, recovery.  I answered her questions to best my ability but advised her to ask these questions to the OB/GYN who is performing her surgery.  She will continue to take a birth control pill daily until her surgery.   Screen for STD - Testing for gonorrhea, chlamydia, trichomoniasis sent -Wet prep  negative  Fatigue Patient has not been sleeping well due to anxiety which could be contributing to fatigue.  We discussed the importance of sleep hygiene.  Patient advised to follow-up about insomnia if it does not improve with good sleep hygiene.  We will also check a CBC for anemia as patient has continued to have vaginal bleeding we will also check a TSH.  Dr. Precious Gilding, [place]

## 2021-09-01 ENCOUNTER — Other Ambulatory Visit (HOSPITAL_COMMUNITY)
Admission: RE | Admit: 2021-09-01 | Discharge: 2021-09-01 | Disposition: A | Discharge status: Home / Self Care | Payer: Commercial Managed Care - HMO | Source: Ambulatory Visit | Attending: Family Medicine | Admitting: Family Medicine

## 2021-09-01 ENCOUNTER — Ambulatory Visit (INDEPENDENT_AMBULATORY_CARE_PROVIDER_SITE_OTHER): Payer: Commercial Managed Care - HMO | Admitting: Student

## 2021-09-01 ENCOUNTER — Encounter: Payer: Self-pay | Admitting: Student

## 2021-09-01 VITALS — BP 107/78 | HR 95 | Ht 62.0 in | Wt 176.2 lb

## 2021-09-01 DIAGNOSIS — N760 Acute vaginitis: Secondary | ICD-10-CM | POA: Diagnosis present

## 2021-09-01 DIAGNOSIS — Z113 Encounter for screening for infections with a predominantly sexual mode of transmission: Secondary | ICD-10-CM | POA: Insufficient documentation

## 2021-09-01 DIAGNOSIS — B9689 Other specified bacterial agents as the cause of diseases classified elsewhere: Secondary | ICD-10-CM

## 2021-09-01 DIAGNOSIS — N939 Abnormal uterine and vaginal bleeding, unspecified: Secondary | ICD-10-CM | POA: Diagnosis not present

## 2021-09-01 DIAGNOSIS — R5383 Other fatigue: Secondary | ICD-10-CM

## 2021-09-01 LAB — POCT WET PREP (WET MOUNT)
Clue Cells Wet Prep Whiff POC: NEGATIVE
Trichomonas Wet Prep HPF POC: ABSENT
WBC, Wet Prep HPF POC: NONE SEEN

## 2021-09-01 NOTE — Assessment & Plan Note (Addendum)
Patient had several questions about future hysterectomy, recovery.  I answered her questions to best my ability but advised her to ask these questions to the OB/GYN who is performing her surgery.  She will continue to take a birth control pill daily until her surgery.

## 2021-09-01 NOTE — Patient Instructions (Signed)
It was great to see you! Thank you for allowing me to participate in your care!  I recommend that you always bring your medications to each appointment as this makes it easy to ensure you are on the correct medications and helps [place] not miss when refills are needed.  Our plans for today:  - you tested negative for BV - I recommend practicing good sleep hygiene.  This means no screens 1 to 2 hours before bedtime, no increased physical activity 1 to 2 hours for bedtime, no meals 2 to 3 hours prior to bedtime, find relaxing activity to do an hour before bedtime such as reading, journaling, a puzzle, chatting with a friend, etc.  We are checking some labs today, I will call you if they are abnormal will send you a MyChart message or a letter if they are normal.  If you do not hear about your labs in the next 2 weeks please let us know.  Take care and seek immediate care sooner if you develop any concerns.   Dr. Precious Gilding, DO [facility]

## 2021-09-01 NOTE — Assessment & Plan Note (Signed)
-   Testing for gonorrhea, chlamydia, trichomoniasis sent -Wet prep negative

## 2021-09-01 NOTE — Assessment & Plan Note (Signed)
Patient has not been sleeping well due to anxiety which could be contributing to fatigue.  We discussed the importance of sleep hygiene.  Patient advised to follow-up about insomnia if it does not improve with good sleep hygiene.  We will also check a CBC for anemia as patient has continued to have vaginal bleeding we will also check a TSH.

## 2021-09-02 LAB — CBC
Hematocrit: 44.4 % (ref 34.0–46.6)
Hemoglobin: 14.2 g/dL (ref 11.1–15.9)
MCH: 28 pg (ref 26.6–33.0)
MCHC: 32 g/dL (ref 31.5–35.7)
MCV: 87 fL (ref 79–97)
Platelets: 388 10*3/uL (ref 150–450)
RBC: 5.08 x10E6/uL (ref 3.77–5.28)
RDW: 14.6 % (ref 11.7–15.4)
WBC: 6 10*3/uL (ref 3.4–10.8)

## 2021-09-02 LAB — CERVICOVAGINAL ANCILLARY ONLY
Chlamydia: NEGATIVE
Comment: NEGATIVE
Comment: NEGATIVE
Comment: NORMAL
Neisseria Gonorrhea: NEGATIVE
Trichomonas: NEGATIVE

## 2021-09-02 LAB — TSH: TSH: 1.7 u[IU]/mL (ref 0.450–4.500)

## 2021-09-03 ENCOUNTER — Ambulatory Visit: Payer: Managed Care, Other (non HMO) | Admitting: Obstetrics & Gynecology

## 2021-09-16 ENCOUNTER — Other Ambulatory Visit: Payer: Self-pay | Admitting: Obstetrics & Gynecology

## 2021-09-16 DIAGNOSIS — R102 Pelvic and perineal pain: Secondary | ICD-10-CM

## 2021-09-16 MED ORDER — IBUPROFEN 800 MG PO TABS: 800.0000 mg | ORAL_TABLET | Freq: Three times a day (TID) | ORAL | 2 refills | Status: DC | PRN

## 2021-09-20 ENCOUNTER — Ambulatory Visit (HOSPITAL_COMMUNITY)
Admission: RE | Admit: 2021-09-20 | Discharge: 2021-09-20 | Disposition: A | Discharge status: Home / Self Care | Payer: Commercial Managed Care - HMO | Source: Ambulatory Visit | Attending: Physician Assistant | Admitting: Physician Assistant

## 2021-09-20 ENCOUNTER — Encounter (HOSPITAL_COMMUNITY): Payer: Self-pay

## 2021-09-20 ENCOUNTER — Other Ambulatory Visit: Payer: Self-pay

## 2021-09-20 VITALS — BP 103/73 | HR 101 | Temp 98.2°F | Resp 18

## 2021-09-20 DIAGNOSIS — H6501 Acute serous otitis media, right ear: Secondary | ICD-10-CM | POA: Diagnosis not present

## 2021-09-20 MED ORDER — AMOXICILLIN 500 MG PO CAPS: 1000.0000 mg | ORAL_CAPSULE | Freq: Two times a day (BID) | ORAL | 0 refills | Status: AC

## 2021-09-20 MED ORDER — CETIRIZINE HCL 10 MG PO TABS: 10.0000 mg | ORAL_TABLET | Freq: Every day | ORAL | 0 refills | Status: DC

## 2021-09-20 MED ORDER — PSEUDOEPHEDRINE HCL 60 MG PO TABS: 60.0000 mg | ORAL_TABLET | Freq: Four times a day (QID) | ORAL | 0 refills | Status: DC | PRN

## 2021-09-20 NOTE — ED Provider Notes (Signed)
[place]   MRN: [id number] DOB: 1980/05/20  Subjective:   [name] is a 41 y.o. female presenting for right ear fullness x3 days.  States that she is worried a bug might be crawling around in there.  She has a full and itchy sensation sometimes.  She put sweet oil in there today without much improvement.  She has no fever or chills.  No headache or dizziness.  No sore throat.  No recent sickness.  No recent travel.  No current facility-administered medications for this encounter.  Current Outpatient Medications:    amoxicillin (AMOXIL) 500 MG capsule, Take 2 capsules (1,000 mg total) by mouth 2 (two) times daily for 5 days., Disp: 20 capsule, Rfl: 0   cetirizine (ZYRTEC) 10 MG tablet, Take 1 tablet (10 mg total) by mouth daily., Disp: 30 tablet, Rfl: 0   pseudoephedrine (SUDAFED) 60 MG tablet, Take 1 tablet (60 mg total) by mouth every 6 (six) hours as needed for congestion., Disp: 30 tablet, Rfl: 0   acetaminophen (TYLENOL) 500 MG tablet, Take 1,000 mg by mouth every 8 (eight) hours as needed for moderate pain., Disp: , Rfl:    clindamycin (CLINDAGEL) 1 % gel, Apply topically 2 (two) times daily. (Patient not taking: Reported on 08/27/2021), Disp: 30 g, Rfl: 0   diclofenac (VOLTAREN) 75 MG EC tablet, Take 1 tablet (75 mg total) by mouth 2 (two) times daily. (Patient not taking: Reported on 06/04/2021), Disp: 14 tablet, Rfl: 0   ibuprofen (ADVIL) 800 MG tablet, Take 1 tablet (800 mg total) by mouth every 8 (eight) hours as needed., Disp: 30 tablet, Rfl: 2   norethindrone (AYGESTIN) 5 MG tablet, Take 1 tablet (5 mg total) by mouth daily., Disp: 30 tablet, Rfl: 2   tiZANidine (ZANAFLEX) 4 MG tablet, Take 1 tablet (4 mg total) by mouth every 6 (six) hours as needed for muscle spasms. (Patient not taking: Reported on 06/04/2021), Disp: 20 tablet, Rfl: 0   Allergies  Allergen Reactions   Sulfa Antibiotics Other (See Comments)    Upset stomach - can tolerate     Past  Medical History:  Diagnosis Date   Endometriosis    History of 2019 novel coronavirus disease (COVID-19)    pt tested positive 03-03-2019, results in care everywhere.  (05-22-2019 per pt had symptoms of fatigue, headache, and loss of taste/ smell;  fatigue and headache after one week and loss taste/ smell resolved after 3 wks)   Infertility, female    Ovarian cyst    Pelvic mass in female    large fluid filled pelvic mass   Recurrent boils    Septate uterus    Trichomoniasis      Past Surgical History:  Procedure Laterality Date   BREAST BIOPSY Right 1999   per pt benign    DILITATION & CURRETTAGE/HYSTROSCOPY WITH NOVASURE ABLATION N/A 05/08/2021   Procedure: HYSTEROSCOPY, DILATATION & CURETTAGE, ENDOMETRIAL ABLATION WITH NOVASURE;  Surgeon: Lavonia Drafts, MD;  Location: [facility];  Service: Gynecology;  Laterality: N/A;   LAPAROSCOPIC SALPINGOOPHERECTOMY Right 03-25-2010   '@[facility]'$    and LYSIS ADHESIONS   LAPAROSCOPY N/A 05/30/2019   Procedure: DIAGNOSTIC LAPAROSCOPY;  Surgeon: Woodroe Mode, MD;  Location: [facility];  Service: Gynecology;  Laterality: N/A;    Family History  Problem Relation Age of Onset   Hypertension Mother    Diabetes Mother    Cancer Father    Hypertension Father    Diabetes Brother  Hypertension Brother    Breast cancer Neg Hx     Social History   Tobacco Use   Smoking status: Some Days    Packs/day: 0.25    Years: 15.00    Total pack years: 3.75    Types: Cigarettes   Smokeless tobacco: Never  Vaping Use   Vaping Use: Never used  Substance Use Topics   Alcohol use: Yes    Comment: occasionally- on holidays   Drug use: Never    ROS REFER TO HPI FOR PERTINENT POSITIVES AND NEGATIVES   Objective:   Vitals: BP 103/73 (BP Location: Left Arm)   Pulse (!) 101   Temp 98.2 F (36.8 C) (Oral)   Resp 18   SpO2 99%   Physical Exam Constitutional:      Appearance: Normal appearance.  HENT:     Right Ear: Hearing,  ear canal and external ear normal. A middle ear effusion is present. Tympanic membrane is not erythematous, retracted or bulging.     Left Ear: Hearing, tympanic membrane, ear canal and external ear normal.     Nose: Nose normal.     Mouth/Throat:     Mouth: Mucous membranes are moist.  Eyes:     Extraocular Movements: Extraocular movements intact.     Conjunctiva/sclera: Conjunctivae normal.     Pupils: Pupils are equal, round, and reactive to light.  Cardiovascular:     Rate and Rhythm: Normal rate and regular rhythm.  Pulmonary:     Effort: Pulmonary effort is normal.  Neurological:     Mental Status: She is alert.  Psychiatric:        Mood and Affect: Mood normal.        Behavior: Behavior normal.     No results found for this or any previous visit (from the past 24 hour(s)).  Assessment and Plan :   PDMP not reviewed this encounter.  1. Right acute serous otitis media, recurrence not specified    Reassured patient that this looks like a serous otitis media.  Usually these can be managed with antihistamines and a decongestant.  She is going to take cetirizine and Sudafed as directed.  If worsening symptoms or no improvement in the next 3 days, she will start on the amoxicillin.  Pt aware of risks vs benefits and possible adverse reactions.  She will recheck as needed.    AllwardtRanda Evens, PA-C 09/20/21 1041

## 2021-09-20 NOTE — ED Triage Notes (Signed)
Pt here for right ear pain and fullness x 3 days  

## 2021-09-20 NOTE — Discharge Instructions (Signed)
Take sudafed and cetirizine as directed. Nasal saline, fluids.  If worse pain or no improvement after three days, start on the amoxicillin antibiotic. Recheck as needed.

## 2021-09-29 ENCOUNTER — Ambulatory Visit (INDEPENDENT_AMBULATORY_CARE_PROVIDER_SITE_OTHER): Payer: Commercial Managed Care - HMO | Admitting: Family Medicine

## 2021-09-29 VITALS — BP 124/86 | HR 80 | Ht 62.0 in | Wt 182.4 lb

## 2021-09-29 DIAGNOSIS — M67472 Ganglion, left ankle and foot: Secondary | ICD-10-CM | POA: Diagnosis not present

## 2021-09-29 NOTE — Progress Notes (Signed)
    SUBJECTIVE:   CHIEF COMPLAINT / HPI:   Patient presents with cyst on her left foot for the past week and has been worsening over the past week. She reports that the size has not changed but she is getting pain when she walks. She has had this in the past and was referred to another location to have it drained. Denies fever, chills, skin changes or drainage.   OBJECTIVE:   BP 124/86   Pulse 80   Ht '5\' 2"'$  (1.575 m)   Wt 182 lb 6.4 oz (82.7 kg)   SpO2 100%   BMI 33.36 kg/m   General: Patient well-appearing, in no acute distress. Ext: less than 1 cm ganglion cyst noted on the dorsal aspect of the left foot, no drainage or bleeding noted, no associated or surrounding erythema noted, mildly tender upon palpation without other tenderness   ASSESSMENT/PLAN:   Ganglion cyst of left foot -patient would prefer aspiration, she has had this procedure performed previously there the last time and was instructed to return if it reappears  -referral placed to sports medicine and contact information provided to patient -instructed to wear comfortable, loose shoes in the meantime      Reighlynn Swiney Larae Grooms, [place]

## 2021-09-29 NOTE — Patient Instructions (Signed)
It was great seeing you today!  Today we discussed your ganglion cyst which is benign. Given how painful it is and that you had it drained previously, I have placed a referral for them to drain this. Please wear comfortable shoes in the meantime.   Please call [phone] to make an appointment.   Please follow up at your next scheduled appointment, if anything arises between now and then, please don't hesitate to contact our office.   Thank you for allowing [place] to be a part of your medical care!  Thank you, Dr. Larae Grooms

## 2021-09-29 NOTE — Assessment & Plan Note (Addendum)
-  patient would prefer aspiration, she has had this procedure performed previously there the last time and was instructed to return if it reappears  -referral placed to sports medicine and contact information provided to patient -instructed to wear comfortable, loose shoes in the meantime

## 2021-10-01 ENCOUNTER — Ambulatory Visit: Payer: Commercial Managed Care - HMO | Admitting: Family Medicine

## 2021-10-01 ENCOUNTER — Ambulatory Visit: Payer: Self-pay

## 2021-10-01 VITALS — BP 100/68 | Ht 62.0 in | Wt 182.0 lb

## 2021-10-01 DIAGNOSIS — M25572 Pain in left ankle and joints of left foot: Secondary | ICD-10-CM

## 2021-10-01 NOTE — Patient Instructions (Addendum)
We aspirated your ganglion cyst on your foot today Keep it compressed to prevent it from swelling up again I recommend a BodyHelix Ankle Sleeve, available here in [facility] [email or web address] Come back to see [place] as needed if foot pain does not get better

## 2021-10-01 NOTE — Telephone Encounter (Signed)
Called patient to discuss further.   Patient reports she was told she would need to come in for a referral for Ganglion Cyst.   Patient came in on 8/21 and was referred to sports medicine by [place].   Patient reports she is already a patient at sports medicine and the visit was wasteful, therefore she should not be charged.  Will forward to [place] for additional input.

## 2021-10-01 NOTE — Progress Notes (Unsigned)
  [name] - 41 y.o. female MRN [id number]  Date of birth: 14-Aug-1980    CHIEF COMPLAINT:   left foot cyst    SUBJECTIVE:   HPI:  Pleasant 41 year old female here for left foot ganglion cyst.  This is been present for about 2 weeks.  She says she has significant pain in the dorsal aspect of the foot near the cyst that makes it difficult to bear weight and walk on the foot. This is the second time she has had ganglion cyst dislocation.  She previously had 1 there last year and came here to this clinic and had it drained by Dr. Barbaraann Barthel.  She would like to drain it again today.  ROS:     See HPI  PERTINENT  PMH / PSH FH / / SH:  Past Medical, Surgical, Social, and Family History Reviewed & Updated in the EMR.  Pertinent findings include:  Ganglion cyst same foot in 2022  OBJECTIVE: BP 100/68   Ht '5\' 2"'$  (1.575 m)   Wt 182 lb (82.6 kg)   BMI 33.29 kg/m   Physical Exam:  Vital signs are reviewed.  GEN: Alert and oriented, NAD Pulm: Breathing unlabored PSY: normal mood, congruent affect  MSK: L Foot -obvious 2 cm raised ganglion cyst present on the dorsum of the foot above the third and fourth TMT joints. No overlying erythema.  Full range of motion not ankle.  Antalgic gait.  ASSESSMENT & PLAN:  1.  Ganglion cyst -Left foot ganglion cyst located over the dorsum of third and fourth TMT joints with ultrasound evidence of stock going into TMT joint.  We will proceed with aspiration today.  See procedure note for full details.  She can follow-up as needed. If no improvement, would consider MRI for other causes of her foot pain.  Procedure Note After informed written consent timeout was performed, patient was sitting on exam table.  Left foot was prepped with alcohol swab. 54m of lidocaine was used for local anesthesia at base of cyst.  Then using an 18g needle on 5cc syringe, scant amount of clear gel like fluid was aspirated from left foot cyst. Patient tolerated procedure well  without immediate complications.  Post-procedure ultrasound confirmed complete collapse of cyst.   HDortha Kern MD PGY-4, Sports Medicine Fellow [facility]

## 2021-10-02 ENCOUNTER — Encounter: Payer: Self-pay | Admitting: Family Medicine

## 2021-10-06 ENCOUNTER — Ambulatory Visit (INDEPENDENT_AMBULATORY_CARE_PROVIDER_SITE_OTHER): Payer: Commercial Managed Care - HMO | Admitting: Family Medicine

## 2021-10-06 VITALS — BP 119/63 | Ht 62.0 in | Wt 182.0 lb

## 2021-10-06 DIAGNOSIS — M25572 Pain in left ankle and joints of left foot: Secondary | ICD-10-CM | POA: Diagnosis not present

## 2021-10-06 MED ORDER — DICLOFENAC SODIUM 75 MG PO TBEC: 75.0000 mg | DELAYED_RELEASE_TABLET | Freq: Two times a day (BID) | ORAL | 1 refills | Status: DC

## 2021-10-06 NOTE — Progress Notes (Unsigned)
    SUBJECTIVE:   CHIEF COMPLAINT / HPI:   Left foot pain Ms. Brenneman presents for follow-up of her left foot pain.  Previously seen in sports med clinic 10/01/2021 for same complaint.  Ultrasound demonstrated ganglion cyst located over dorsum of third and fourth TMT joints.  It was drained in clinic with ultrasound guidance.  She was recommended to use an ankle compression sleeve to help with pain afterwards.  Today, she reports ongoing pain, bruising, and swelling over the anterior lateral ankle.  She reports the ankle wrap and ibuprofen have helped a little bit.  Cold packs and Epsom salt soaks have not helped at all.  She reports pain over anterior lateral ankle at rest and with any weightbearing including standing and walking.  She says the pain is very very bad first thing in the morning with her first steps out of bed.  No prior injuries, fractures, or surgeries on that ankle.  No knee or hip complaints.  PERTINENT  PMH / PSH: Left foot ganglion cyst, abnormal uterine bleeding, endometriosis, septate uterus, HSV-2, tobacco use  OBJECTIVE:   BP 119/63   Ht '5\' 2"'$  (1.575 m)   Wt 182 lb (82.6 kg)   BMI 33.29 kg/m   Left ankle Inspection shows moderate swelling and greenish ecchymoses of left lateral ankle Palpation over all ankle and foot bones/joints nontender Palpation over distal tibia and fibula nontender No cyst palpated on dorsum of foot Full ROM of ankle and toes Strength 5/5 of dorsiflexion and plantarflexion (mild pain to plantarflexion)  Ultrasound conducted by Dr. Karlton Lemon, MD.  No return of ganglion cyst, no joint effusions.  No evidence of fracture.  ASSESSMENT/PLAN:   Pain of joint of left ankle and foot Unclear etiology, unsure why she has such terrible pain with walking at this time.  Ultrasound today unremarkable and without evidence of effusion, cyst, or fracture.  We will send for complete left ankle x-rays and fit with walking boot.  Rx Voltaren p.o.  trial.     Ezequiel Essex, MD

## 2021-10-06 NOTE — Assessment & Plan Note (Signed)
Unclear etiology, unsure why she has such terrible pain with walking at this time.  Ultrasound today unremarkable and without evidence of effusion, cyst, or fracture.  We will send for complete left ankle x-rays and fit with walking boot.  Rx Voltaren p.o. trial.

## 2021-10-06 NOTE — Patient Instructions (Signed)
Get x-rays after you leave today. We will call you with results - if these are negative we will go ahead with an MRI. Stop ibuprofen. Take diclofenac '75mg'$  twice a day with food for pain and inflammation. Boot when up and walking around.

## 2021-10-07 ENCOUNTER — Encounter: Payer: Self-pay | Admitting: Family Medicine

## 2021-10-11 ENCOUNTER — Other Ambulatory Visit: Payer: Self-pay | Admitting: Student

## 2021-10-11 ENCOUNTER — Ambulatory Visit (HOSPITAL_COMMUNITY): Payer: Self-pay

## 2021-10-11 DIAGNOSIS — M79672 Pain in left foot: Secondary | ICD-10-CM

## 2021-10-11 NOTE — Progress Notes (Signed)
Per patient request, I have sent in a referral to podiatry for her L foot pain which I feel is appropriate based on chart review. I have not seen her for this pain but she has been evaluated by other residents at our office and by sports medicine. I encouraged pt to go get x-ray of L foot previously ordered by sports medicine while she is waiting on the referral to podiatry.

## 2021-10-13 ENCOUNTER — Ambulatory Visit (HOSPITAL_COMMUNITY)
Admission: EM | Admit: 2021-10-13 | Discharge: 2021-10-13 | Disposition: A | Discharge status: Home / Self Care | Payer: Commercial Managed Care - HMO | Attending: Emergency Medicine | Admitting: Emergency Medicine

## 2021-10-13 ENCOUNTER — Ambulatory Visit (INDEPENDENT_AMBULATORY_CARE_PROVIDER_SITE_OTHER): Payer: Commercial Managed Care - HMO

## 2021-10-13 ENCOUNTER — Encounter (HOSPITAL_COMMUNITY): Payer: Self-pay

## 2021-10-13 ENCOUNTER — Ambulatory Visit (HOSPITAL_COMMUNITY): Payer: Self-pay

## 2021-10-13 ENCOUNTER — Ambulatory Visit (HOSPITAL_COMMUNITY): Payer: Commercial Managed Care - HMO

## 2021-10-13 DIAGNOSIS — M25572 Pain in left ankle and joints of left foot: Secondary | ICD-10-CM

## 2021-10-13 MED ORDER — METHYLPREDNISOLONE SODIUM SUCC 125 MG IJ SOLR
INTRAMUSCULAR | Status: AC
  Filled 2021-10-13: qty 2

## 2021-10-13 MED ORDER — METHYLPREDNISOLONE SODIUM SUCC 125 MG IJ SOLR
60.0000 mg | Freq: Once | INTRAMUSCULAR | Status: AC
  Administered 2021-10-13: 60 mg via INTRAMUSCULAR

## 2021-10-13 MED ORDER — PREDNISONE 20 MG PO TABS: 40.0000 mg | ORAL_TABLET | Freq: Every day | ORAL | 0 refills | Status: DC

## 2021-10-13 NOTE — ED Triage Notes (Signed)
Pt is here for left ankle pain x 2 weeks. Denies any falls or trauma.

## 2021-10-13 NOTE — Discharge Instructions (Signed)
Your x-ray today did not show injury to the bone of your ankle. Your pain is possibly due to to injury to the tendon or ligament which will need higher level imaging to confirm.  You have been given an injection of steroids here today in the office, starting tomorrow take prednisone tablets every morning with food for 5 days to continue the process to reduce inflammation which ideally will help your pain,   You been having persistent vaginal bleeding, please touch bases with your gynecologist to determine if they would like you to continue use of anti-inflammatory medicine such as ibuprofen as this may be contributing to your symptoms  You may apply heat or ice, whichever makes you feel better, to affected area in 15 minute intervals  You may continue activity as tolerated, there is no injury therefore, it is important that you continue to move around so you do not loose strength to the area  You may continue use of ankle wrap for stability and support  Your primary doctor has already made a referral to podiatry however I am also giving you information and you may call and attempt to schedule your own appointment

## 2021-10-13 NOTE — ED Provider Notes (Signed)
[place]    CSN: [id number] Arrival date & time: 10/13/21  6294      History   Chief Complaint Chief Complaint  Patient presents with   Ankle Pain    HPI [name] is a 41 y.o. female.   Patient presents with left ankle pain for 2 weeks.  Symptoms started abruptly upon awakening without precipitating event, injury or trauma.  Has been painful to bear weight and range of motion intact but pain is elicited with rotation.  Has attempted use of ibuprofen which has been somewhat helpful.  Denies numbness, tingling.  History of abnormal vaginal bleeding, ganglion cyst to the left foot.   Past Medical History:  Diagnosis Date   Endometriosis    History of 2019 novel coronavirus disease (COVID-19)    pt tested positive 03-03-2019, results in care everywhere.  (05-22-2019 per pt had symptoms of fatigue, headache, and loss of taste/ smell;  fatigue and headache after one week and loss taste/ smell resolved after 3 wks)   Infertility, female    Ovarian cyst    Pelvic mass in female    large fluid filled pelvic mass   Recurrent boils    Septate uterus    Trichomoniasis     Patient Active Problem List   Diagnosis Date Noted   Pain of joint of left ankle and foot 10/06/2021   Screen for STD (sexually transmitted disease) 09/01/2021   Fatigue 09/01/2021   Abnormal uterine bleeding (AUB) 05/08/2021   Exposure to sexually transmitted disease (STD) 03/06/2021   Molluscum contagiosum 12/19/2020   Abdominal pain 08/16/2020   Constipation 08/16/2020   Frequent urination 06/24/2020   Hydradenitis 06/24/2020   Ganglion cyst of left foot 06/13/2020   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   Menorrhagia 12/25/2019   Vaginal discharge 07/08/2019   GERD (gastroesophageal reflux disease) 06/07/2019   HSV-2 seropositive 03/07/2019   Hydrosalpinx 12/30/2018   Endometriosis 05/01/2011   SEPTATE UTERUS 07/24/2009   TOBACCO ABUSE 09/26/2008   Female infertility 09/26/2008    Menometrorrhagia 09/21/2006    Past Surgical History:  Procedure Laterality Date   BREAST BIOPSY Right 1999   per pt benign    DILITATION & CURRETTAGE/HYSTROSCOPY WITH NOVASURE ABLATION N/A 05/08/2021   Procedure: HYSTEROSCOPY, [place], ENDOMETRIAL ABLATION WITH NOVASURE;  Surgeon: Lavonia Drafts, MD;  Location: [facility];  Service: Gynecology;  Laterality: N/A;   LAPAROSCOPIC SALPINGOOPHERECTOMY Right 03-25-2010   '@[facility]'$    and LYSIS ADHESIONS   LAPAROSCOPY N/A 05/30/2019   Procedure: DIAGNOSTIC LAPAROSCOPY;  Surgeon: Woodroe Mode, MD;  Location: [facility];  Service: Gynecology;  Laterality: N/A;    OB History     Gravida  0   Para      Term      Preterm      AB      Living  0      SAB      IAB      Ectopic      Multiple      Live Births               Home Medications    Prior to Admission medications   Medication Sig Start Date End Date Taking? Authorizing Provider  acetaminophen (TYLENOL) 500 MG tablet Take 1,000 mg by mouth every 8 (eight) hours as needed for moderate pain.    [provider]  cetirizine (ZYRTEC) 10 MG tablet Take 1 tablet (10 mg total) by mouth daily. 09/20/21  Allwardt, Alyssa M, PA-C  clindamycin (CLINDAGEL) 1 % gel Apply topically 2 (two) times daily. Patient not taking: Reported on 08/27/2021 07/10/21   Precious Gilding, DO  diclofenac (VOLTAREN) 75 MG EC tablet Take 1 tablet (75 mg total) by mouth 2 (two) times daily. 10/06/21   Hudnall, Sharyn Lull, MD  ibuprofen (ADVIL) 800 MG tablet Take 1 tablet (800 mg total) by mouth every 8 (eight) hours as needed. 09/16/21   Lavonia Drafts, MD  norethindrone (AYGESTIN) 5 MG tablet Take 1 tablet (5 mg total) by mouth daily. 08/15/21   Lavonia Drafts, MD  pseudoephedrine (SUDAFED) 60 MG tablet Take 1 tablet (60 mg total) by mouth every 6 (six) hours as needed for congestion. 09/20/21   Allwardt, Randa Evens, PA-C  tiZANidine (ZANAFLEX) 4 MG tablet Take  1 tablet (4 mg total) by mouth every 6 (six) hours as needed for muscle spasms. Patient not taking: Reported on 06/04/2021 05/02/21   Geryl Councilman L, PA  Norethindrone Acetate-Ethinyl Estrad-FE (LOESTRIN 24 FE) 1-20 MG-MCG(24) tablet Take 1 tablet by mouth daily. 03/21/20 05/28/20  Lavonia Drafts, MD  Norgestimate-Ethinyl Estradiol Triphasic (TRI-SPRINTEC) 0.18/0.215/0.25 MG-35 MCG tablet Take 1 tablet by mouth daily. 11/03/10 05/01/11  Woodroe Mode, MD    Family History Family History  Problem Relation Age of Onset   Hypertension Mother    Diabetes Mother    Cancer Father    Hypertension Father    Diabetes Brother    Hypertension Brother    Breast cancer Neg Hx     Social History Social History   Tobacco Use   Smoking status: Some Days    Packs/day: 0.25    Years: 15.00    Total pack years: 3.75    Types: Cigarettes   Smokeless tobacco: Never  Vaping Use   Vaping Use: Never used  Substance Use Topics   Alcohol use: Yes    Comment: occasionally- on holidays   Drug use: Never     Allergies   Sulfa antibiotics   Review of Systems Review of Systems  Constitutional: Negative.   Respiratory: Negative.    Cardiovascular: Negative.   Musculoskeletal:  Positive for myalgias. Negative for arthralgias, back pain, gait problem, joint swelling, neck pain and neck stiffness.  Skin: Negative.      Physical Exam Triage Vital Signs ED Triage Vitals [10/13/21 0903]  Enc Vitals Group     BP 109/73     Pulse Rate 82     Resp 18     Temp      Temp src      SpO2 98 %     Weight      Height      Head Circumference      Peak Flow      Pain Score      Pain Loc      Pain Edu?      Excl. in [place]?    No data found.  Updated Vital Signs BP 109/73 (BP Location: Left Arm)   Pulse 82   Resp 18   SpO2 98%   Visual Acuity Right Eye Distance:   Left Eye Distance:   Bilateral Distance:    Right Eye Near:   Left Eye Near:    Bilateral Near:     Physical  Exam Constitutional:      Appearance: Normal appearance.  Eyes:     Extraocular Movements: Extraocular movements intact.  Pulmonary:     Effort: Pulmonary effort is normal.  Musculoskeletal:  Comments: Tenderness is present along the anterior aspect of the left ankle and point tenderness is over the lateral malleolus, mild to moderate swelling noted over the lateral malleolus, no ecchymosis or deformity, 2+ dorsalis pedis pulse, sensation intact, able to bear weight, range of motion intact with pain is elicited with rotation,  Neurological:     Mental Status: She is alert and oriented to person, place, and time. Mental status is at baseline.  Psychiatric:        Mood and Affect: Mood normal.        Behavior: Behavior normal.      UC Treatments / Results  Labs (all labs ordered are listed, but only abnormal results are displayed) Labs Reviewed - No data to display  EKG   Radiology No results found.  Procedures Procedures (including critical care time)  Medications Ordered in UC Medications - No data to display  Initial Impression / Assessment and Plan / UC Course  I have reviewed the triage vital signs and the nursing notes.  Pertinent labs & imaging results that were available during my care of the patient were reviewed by me and considered in my medical decision making (see chart for details).  Acute left ankle pain  X-rays negative, discussed with patient, methylprednisolone injection given in office and prednisone burst prescribed for outpatient, recommended continued use of RICE, heat, activity as tolerated for supportive measures, PCP has given referral to podiatry, also given patient walker referral to an additional office for follow-up, work note given Final Clinical Impressions(s) / UC Diagnoses   Final diagnoses:  None   Discharge Instructions   None    ED Prescriptions   None    PDMP not reviewed this encounter.   Hans Eden, NP 10/13/21  1026

## 2021-10-14 NOTE — Addendum Note (Signed)
Addended by: Cyd Silence on: 10/14/2021 09:21 AM   Modules accepted: Orders

## 2021-10-16 ENCOUNTER — Telehealth: Payer: Self-pay | Admitting: Student

## 2021-10-16 NOTE — Telephone Encounter (Signed)
Called patient after several back-and-forth MyChart messages to explain the progression of care.  I have not seen patient for her foot pain but have reviewed the notes.  I explained to patient that it is perfectly reasonable that she was referred to sports medicine for further work-up of her foot pain.  She did receive an x-ray ordered by sports medicine and is scheduled to have her MRI today.  I did refer her to podiatry upon her request and she is scheduled to see them next week.  Patient was appreciative of the call.

## 2021-10-18 ENCOUNTER — Other Ambulatory Visit: Payer: Commercial Managed Care - HMO

## 2021-10-18 ENCOUNTER — Ambulatory Visit
Admission: RE | Admit: 2021-10-18 | Discharge: 2021-10-18 | Disposition: A | Discharge status: Home / Self Care | Payer: Commercial Managed Care - HMO | Source: Ambulatory Visit | Attending: Family Medicine | Admitting: Family Medicine

## 2021-10-18 DIAGNOSIS — M25572 Pain in left ankle and joints of left foot: Secondary | ICD-10-CM

## 2021-10-21 ENCOUNTER — Encounter: Payer: Self-pay | Admitting: Family Medicine

## 2021-10-22 ENCOUNTER — Encounter: Payer: Self-pay | Admitting: Family Medicine

## 2021-10-22 ENCOUNTER — Encounter (HOSPITAL_BASED_OUTPATIENT_CLINIC_OR_DEPARTMENT_OTHER): Payer: Self-pay | Admitting: Obstetrics & Gynecology

## 2021-10-22 ENCOUNTER — Encounter: Payer: Self-pay | Admitting: Student

## 2021-10-22 NOTE — Progress Notes (Unsigned)
MRI reviewed, called patient and left voicemail.  Sent mychart message with results and follow-up questions.  She has an ankle joint effusion and edema in distal tibia.  No evidence fracture.  Suspect inflammatory arthropathy given insidious onset; no erythema, had FROM, and no obvious focus to suggest infectious process; no injury to cause bone contusion.  At first visit 8/23 we discussed possibility of more proximal issue than her dorsal foot ganglion though she wanted this aspirated - agreed to go ahead but that she would return if this did not help her symptoms which it did not.  Returned 8/28 - pain localized to anterolateral ankle worse with weightbearing.  She wanted me to do a steroid injection but I advised that was not appropriate, need to further evaluate why she's having pain.  Exam and ultrasound reassuring - proceeded with radiographs to assess for OCD, early evidence stress fracture which were negative.  Tried cam walker but this made her pain worse and prescribed voltaren.  MRI ordered with results noted above.  She went to ED in the interim and was given steroids - messaged to ask her if these provided her with relief as would expect with a rheumatologic condition.    Pending results of follow-up questions would consider aspiration of ankle joint with ultrasound guidance (cell count, culture, crystals), additional labwork (CRP, ESR, CBC with diff, ACPA, RF, uric acid).    She expressed in message to me that she is unhappy with care we have provided, not giving her answers to what's wrong with her and haven't cured her issue.  She requested referral from PCP to podiatry which she has scheduled tomorrow at 3pm - advised her to keep that appointment.

## 2021-10-23 ENCOUNTER — Telehealth: Payer: Self-pay | Admitting: Podiatry

## 2021-10-23 ENCOUNTER — Ambulatory Visit: Payer: Commercial Managed Care - HMO | Admitting: Podiatry

## 2021-10-23 DIAGNOSIS — M25472 Effusion, left ankle: Secondary | ICD-10-CM

## 2021-10-23 DIAGNOSIS — M25572 Pain in left ankle and joints of left foot: Secondary | ICD-10-CM

## 2021-10-23 DIAGNOSIS — M659 Synovitis and tenosynovitis, unspecified: Secondary | ICD-10-CM | POA: Diagnosis not present

## 2021-10-23 MED ORDER — METHYLPREDNISOLONE 4 MG PO TBPK: ORAL_TABLET | ORAL | 0 refills | Status: DC

## 2021-10-23 NOTE — Progress Notes (Unsigned)
Subjective:  Patient ID: [name], female    DOB: 02-07-1981,  MRN: [id number]  Chief Complaint  Patient presents with   left foot pain    Patient is here complaining of left foot pain.patient had Mri and X-ray  done, waiting for Mri results.    41 y.o. female presents with left ankle pain for approximately 2-1/2 weeks.  She has previously seen sports medicine physician Dr. Barbaraann Barthel.  Per report she previously had a ganglion on her left ankle that has been drained x2.  This was most recently done on October 01, 2021.  She had previously had it drained the year prior.  Reports that she woke up with significant pain in the dorsal aspect of the foot and ankle near where the cyst was previously approximately 2 and half weeks ago.  Denies any injury.  She followed up with Dr. Barbaraann Barthel on October 06, 2021 for follow-up of left foot and ankle pain.  She reported ongoing pain bruising and swelling over the anterior lateral ankle she reports the ankle wrap and ibuprofen had helped a little bit at that time but that cold ice packs and Epsom salt soaks were not helpful having difficulty walking and with weightbearing worse with for steps out of bed in the morning.  There is no evidence of infection or gout at that visit.  She was tried in an ankle walking boot but apparently this made the pain worse though unclear how much she was wearing it as she stated she cannot wear it for her job.  The plan was for an MRI if there were negative x-rays of the left ankle.  She was also prescribed diclofenac for pain control.  The patient then presented to urgent care on October 13, 2021 for acute left ankle pain.  Per the note treatment was methylprednisone injection and prednisone burst prescribed for outpatient.  The patient then went on to get an MRI of the left ankle without contrast on October 18, 2021.  This MRI demonstrated subchondral marrow edema of the tibial plafond prominent about the anterior lateral aspect of the  tibia with associated moderate joint effusion.  Also articular cartilage thinning without evidence of full-thickness cartilage defect.  Suggestive of bone contusion with associated effusion with differential diagnosis of infectious or inflammatory process and that all tendons and ligaments are intact.  The patient now presents to the office for another opinion on her left ankle pain as per note she was not satisfied with the care that she had received previously.  Per the last note from Dr. Barbaraann Barthel next visit plan was for possible joint aspiration and additional lab work.  She states that she did not take the full course of steroids that were previously prescribed to her from the urgent care and is not sure if the steroids were very helpful.  Patient presents with her mother who is also present in the exam.  She states she has been wearing a soft ankle brace but is hopeful for a different type of brace that is more supportive but does not want a walking boot because she cannot wear that at work.  She denies any nausea vomiting fever chills at this time.  She is able to move the ankle up and down.  Past Medical History:  Diagnosis Date   Endometriosis    w/abnormal uterine bleeding, Pt follows with Dr. Margaretmary Dys, [place] 08/27/21 as of 10/22/21.   Ganglion cyst of foot    left foot, hx of  aspiration, Follows with Dr. Everardo Beals, Sports Medicine, [place] 10/06/21   History of 2019 novel coronavirus disease (COVID-19)    pt tested positive 03-03-2019, results in care everywhere.  (05-22-2019 per pt had symptoms of fatigue, headache, and loss of taste/ smell;  fatigue and headache after one week and loss taste/ smell resolved after 3 wks)   Infertility, female    Ovarian cyst    Pelvic mass in female    large fluid filled pelvic mass   Recurrent boils    Septate uterus    Trichomoniasis    hx of    Allergies  Allergen Reactions   Sulfa Antibiotics Other (See Comments)    Upset stomach - can  tolerate     ROS: Negative except as per HPI above  Objective:  General: AAO x3, NAD  Dermatological: With inspection and palpation of the right and left lower extremities there are no open sores, no preulcerative lesions, no rash or signs of infection present. Nails are of normal length thickness and coloration.   Vascular:  Dorsalis Pedis artery and Posterior Tibial artery pedal pulses are 2/4 bilateral.  Capillary fill time brisk < 3 sec. Pedal hair growth present. No varicosities and no lower extremity edema present bilateral. There is no pain with calf compression, swelling, warmth, erythema.   Neruologic: Grossly intact via light touch bilateral. Protective threshold intact to all sites bilateral. Patellar and Achilles deep tendon reflexes 2+ bilateral. Negative Babinski reflex.   Musculoskeletal: Attention directed the left ankle there is pain with palpation across the anterior medial and lateral aspect of the tibiotalar articulation.  Pain possibly worst at the anterior lateral portion of the joint line just medial to the distal fibula and lateral malleolus.  Pain is slightly increased with passive dorsiflexion and plantarflexion of the ankle.  However she does have relatively good range of motion passively and actively.  No no grinding or locking of the joint noted with range of motion no significant erythema or increased warmth of the left ankle.  Minimal edema noted about the left ankle.  Gait: Unassisted, antalgic  No images are attached to the encounter.  Radiographs:  Date: October 13, 2021 XR left ankle weightbearing AP/Lateral/mortise Findings: No significant osseous or soft tissue abnormalities noted.  No evidence of fracture dislocation or effusion. Assessment:   1. Synovitis of left ankle   2. Effusion of left ankle   3. Pain of joint of left ankle and foot      Plan:  Patient was evaluated and treated and all questions answered.  #Synovitis and effusion of left  ankle -I discussed with the patient that her left ankle pain may be related to either an infectious process versus gout versus synovitis related to prior injury versus systemic arthropathy of unknown etiology.  -The patient requested a steroid injection into her left ankle joint at this visit.  However given the concern for possible infectious process such as septic arthritis of the left ankle I agree with prior recommendation of Dr. Barbaraann Barthel that it would not be wise to perform a steroid injection given risk for worsening her symptoms..   -I recommended we proceed with a left ankle joint aspiration in order to send the fluid for culture crystal analysis and cytology.  Discussed that this would give [place] further information as to the cause of her left ankle pain prior to proceeding with any specific course of treatment.  The patient consented this.  After sterile prep with Betadine at the  anterior lateral aspect of the left ankle joint, I proceeded to inject 5 cc of mixture of 2% lidocaine with half percent Marcaine plain in the anterior lateral superficial soft tissues.  I then used a 18-gauge needle and 10 cc syringe to perform a left ankle joint aspiration.  Upon attempting to enter the joint 2 times from the anterior lateral aspect of the ankle joint I was unable to  aspirate any significant amount of joint fluid other than approximately 1 cc of serosanguineous fluid. due to the patient's pain associated with the procedure I decided to abort the procedure at this time.  I did send the 1 cc of fluid for culture.  The ankle joint was then recleansed with Betadine and an adhesive bandage was applied.  -I would like to send the patient for additional lab work including ESR and CRP as well as uric acid.  Will work-up for possible infectious process versus gout.  If these are unremarkable we will proceed with treatment of this left ankle pain as a transient inflammatory process and treat with a course of steroid  Dosepak.  I sent a prescription for the steroid Dosepak per the patient's request at this visit.  She will begin taking it pending further results of the blood work and culture. -I recommended the patient to proceed with use of a cam boot for ankle immobilization when weightbearing.  However upon learning that she would be responsible for the cost of the boot if it was not covered by insurance she decided that she did not want to proceed with this treatment. -I discussed with the patient that I would like to see her back in approximately 1 week to discuss the results of her lab work and culture as well as progress after taking the steroid..  If there is any abnormality I will call her to discuss further treatment which could include the possibility of arthroscopic evaluation and irrigation and debridement of the left ankle if her symptoms do not improve or worsen.  If she has any signs of nausea vomiting fever or chills I recommend she proceed to the emergency department for further evaluation.         Everitt Amber, DPM [facility] / [facility]

## 2021-10-23 NOTE — Telephone Encounter (Signed)
Pt called asking about her RX prescription she stated she went to the pharmacy and it wasn't there yet.   Please advise

## 2021-10-24 LAB — C-REACTIVE PROTEIN: CRP: 7.1 mg/L (ref <8.0)

## 2021-10-24 LAB — SEDIMENTATION RATE: Sed Rate: 9 mm/h (ref 0–20)

## 2021-10-24 LAB — URIC ACID: Uric Acid, Serum: 4.2 mg/dL (ref 2.5–7.0)

## 2021-10-24 NOTE — Telephone Encounter (Signed)
Prescription was sent to pharmacy for pickup.

## 2021-10-27 ENCOUNTER — Telehealth: Payer: Self-pay | Admitting: Podiatry

## 2021-10-27 NOTE — Telephone Encounter (Signed)
Patient is calling for lab results, can see on MYchart but not able to interpret, please advise. She is also wanting to schedule appointment for cortisone shot. Please advise.

## 2021-10-27 NOTE — Telephone Encounter (Signed)
Patient called back, send message to [facility] to see if we can get her in this Thursday. Will  follow up with patient once a decision has been made.

## 2021-10-27 NOTE — Telephone Encounter (Signed)
Pt called stating she has gotten the results that you ordered and everything looks normal to her and she is wanting to come in for her cortisone shot. I offered appt 10.12 and she said she needs to come in sooner. I did offer to put on waitlist.

## 2021-10-28 ENCOUNTER — Encounter (HOSPITAL_BASED_OUTPATIENT_CLINIC_OR_DEPARTMENT_OTHER): Payer: Self-pay | Admitting: Obstetrics & Gynecology

## 2021-10-28 ENCOUNTER — Other Ambulatory Visit: Payer: Self-pay

## 2021-10-28 LAB — BODY FLUID CULTURE

## 2021-10-28 LAB — SPECIMEN STATUS REPORT

## 2021-10-28 NOTE — Progress Notes (Signed)
Your procedure is scheduled on Tuesday, November 11, 2021.  Report to [facility].   Call this number if you have problems the morning of surgery  :[phone].   OUR ADDRESS IS [address].  WE ARE LOCATED IN [facility].  PLEASE BRING YOUR INSURANCE CARD AND PHOTO ID DAY OF SURGERY.  ONLY 2 PEOPLE ARE ALLOWED IN  WAITING  ROOM.                                      REMEMBER:  DO NOT EAT FOOD, CANDY GUM OR MINTS  AFTER MIDNIGHT THE NIGHT BEFORE YOUR SURGERY . YOU MAY HAVE CLEAR LIQUIDS FROM MIDNIGHT THE NIGHT BEFORE YOUR SURGERY UNTIL  7:30 AM. NO CLEAR LIQUIDS AFTER   7:30 AM DAY OF SURGERY.  YOU MAY  BRUSH YOUR TEETH MORNING OF SURGERY AND RINSE YOUR MOUTH OUT, NO CHEWING GUM CANDY OR MINTS.     CLEAR LIQUID DIET   Foods Allowed                                                                     Foods Excluded  Coffee and tea, regular and decaf                             liquids that you cannot  Plain Jell-O                                                                   see through such as: Fruit ices (not with fruit pulp)                                     milk, soups, orange juice  Plain  Popsicles                                    All solid food Carbonated beverages, regular and diet                                    Cranberry, grape and apple juices Sports drinks like Gatorade _____________________________________________________________________     TAKE THESE MEDICATIONS MORNING OF SURGERY: NONE    UP TO 4 VISITORS  MAY VISIT IN THE EXTENDED RECOVERY ROOM UNTIL 800 PM ONLY.  ONE  VISITOR AGE 42 AND OVER MAY SPEND THE NIGHT AND MUST BE IN EXTENDED RECOVERY ROOM NO LATER THAN 800 PM . YOUR DISCHARGE TIME AFTER YOU SPEND THE NIGHT IS 900 AM THE MORNING AFTER YOUR SURGERY.  YOU MAY PACK A SMALL OVERNIGHT BAG WITH TOILETRIES FOR YOUR OVERNIGHT  STAY IF YOU WISH.  YOUR PRESCRIPTION MEDICATIONS WILL BE  PROVIDED DURING [place].                                      DO NOT WEAR JEWERLY, MAKE UP. DO NOT WEAR LOTIONS, POWDERS, PERFUMES OR NAIL POLISH ON YOUR FINGERNAILS. TOENAIL POLISH IS OK TO WEAR. DO NOT SHAVE FOR 48 HOURS PRIOR TO DAY OF SURGERY. MEN MAY SHAVE FACE AND NECK. CONTACTS, GLASSES, OR DENTURES MAY NOT BE WORN TO SURGERY.  REMEMBER: NO SMOKING, DRUGS OR ALCOHOL FOR 24 HOURS BEFORE YOUR SURGERY.                                    [facility] IS NOT RESPONSIBLE  FOR ANY BELONGINGS.                                                                    Marland Kitchen           [facility] - Preparing for Surgery Before surgery, you can play an important role.  Because skin is not sterile, your skin needs to be as free of germs as possible.  You can reduce the number of germs on your skin by washing with CHG (chlorahexidine gluconate) soap before surgery.  CHG is an antiseptic cleaner which kills germs and bonds with the skin to continue killing germs even after washing. Please DO NOT use if you have an allergy to CHG or antibacterial soaps.  If your skin becomes reddened/irritated stop using the CHG and inform your nurse when you arrive at Short Stay. Do not shave (including legs and underarms) for at least 48 hours prior to the first CHG shower.  You may shave your face/neck. Please follow these instructions carefully:  1.  Shower with CHG Soap the night before surgery and the  morning of Surgery.  2.  If you choose to wash your hair, wash your hair first as usual with your  normal  shampoo.  3.  After you shampoo, rinse your hair and body thoroughly to remove the  shampoo.                            4.  Use CHG as you would any other liquid soap.  You can apply chg directly  to the skin and wash , please wash your belly button thoroughly with chg soap provided night before and morning of your surgery.                     Gently with a scrungie or clean washcloth.  5.  Apply the CHG Soap  to your body ONLY FROM THE NECK DOWN.   Do not use on face/ open                           Wound or open sores. Avoid contact with eyes, ears mouth and genitals (private parts).  Wash face,  Genitals (private parts) with your normal soap.             6.  Wash thoroughly, paying special attention to the area where your surgery  will be performed.  7.  Thoroughly rinse your body with warm water from the neck down.  8.  DO NOT shower/wash with your normal soap after using and rinsing off  the CHG Soap.                9.  Pat yourself dry with a clean towel.            10.  Wear clean pajamas.            11.  Place clean sheets on your bed the night of your first shower and do not  sleep with pets. Day of Surgery : Do not apply any lotions/deodorants the morning of surgery.  Please wear clean clothes to the hospital/surgery center.  IF YOU HAVE ANY SKIN IRRITATION OR PROBLEMS WITH THE SURGICAL SOAP, PLEASE GET A BAR OF GOLD DIAL SOAP AND SHOWER THE NIGHT BEFORE YOUR SURGERY AND THE MORNING OF YOUR SURGERY. PLEASE LET THE NURSE KNOW MORNING OF YOUR SURGERY IF YOU HAD ANY PROBLEMS WITH THE SURGICAL SOAP.   ________________________________________________________________________                                                        QUESTIONS [facility] PRE OP NURSE PHONE [phone].

## 2021-10-28 NOTE — Progress Notes (Signed)
Spoke w/ via phone for pre-op interview---[facility] Lab needs dos----urine pregnancy per anesthesia, surgeon orders pending as of 10/28/21               Lab results------11/07/21 lab appt for cbc, type & screen COVID test -----patient states asymptomatic no test needed Arrive at -------0830 on Tuesday, 11/11/21 NPO after MN NO Solid Food.  Clear liquids from MN until---0730 Med rec completed Medications to take morning of surgery -----NONE Diabetic medication -----n/a Patient instructed no nail polish to be worn day of surgery Patient instructed to bring photo id and insurance card day of surgery Patient aware to have Driver (ride ) / caregiver    for 24 hours after surgery brother - driver, mother - caregiver Patient Special Instructions -----Extended / overnight stay instructions given.  Pre-Op special Istructions -----Requested orders via Epic IB from Dr. Ihor Dow  on 10/28/21. Patient verbalized understanding of instructions that were given at this phone interview. Patient denies shortness of breath, chest pain, fever, cough at this phone interview.

## 2021-10-30 ENCOUNTER — Encounter: Payer: Self-pay | Admitting: Podiatry

## 2021-10-30 ENCOUNTER — Ambulatory Visit (INDEPENDENT_AMBULATORY_CARE_PROVIDER_SITE_OTHER): Payer: Commercial Managed Care - HMO | Admitting: Podiatry

## 2021-10-30 ENCOUNTER — Encounter: Payer: Self-pay | Admitting: Obstetrics & Gynecology

## 2021-10-30 DIAGNOSIS — M659 Synovitis and tenosynovitis, unspecified: Secondary | ICD-10-CM | POA: Diagnosis not present

## 2021-10-30 DIAGNOSIS — M65062 Abscess of tendon sheath, left lower leg: Secondary | ICD-10-CM | POA: Diagnosis not present

## 2021-10-30 DIAGNOSIS — M25472 Effusion, left ankle: Secondary | ICD-10-CM

## 2021-10-30 DIAGNOSIS — M25572 Pain in left ankle and joints of left foot: Secondary | ICD-10-CM

## 2021-10-30 NOTE — Progress Notes (Signed)
Subjective:  Patient ID: [name], female    DOB: 16-Apr-1980,  MRN: [id number]  Chief Complaint  Patient presents with   Foot Problem    Foot pain x3 weeks, pain located in  the mid foot and radiates up to the shin of left foot     41 y.o. female presents with pain in the left ankle.  At last visit joint aspiration was attempted some fluid was removed and sent for culture.  The culture was negative.  Additionally lab work has been completed to check for septic arthritis of the left ankle.  ESR and CRP both were within normal limits not suggestive of ankle infection.  She did not take the methylprednisone taper pack as it was causing her side effects including upset stomach.  She is here today hoping to get an injection into her left ankle for pain relief.  Past Medical History:  Diagnosis Date   Arthritis    left ankle   COVID-19 09/24/2020   flu like symptoms per pt   Endometriosis    w/abnormal uterine bleeding, Pt follows with Dr. Margaretmary Dys, [place] 08/27/21 as of 10/22/21.   Ganglion cyst of foot    left foot, hx of aspiration, Follows with Dr. Everardo Beals, Sports Medicine, [place] 10/06/21   GERD (gastroesophageal reflux disease)    occasional with spicy foods   History of 2019 novel coronavirus disease (COVID-19)    pt tested positive 03-03-2019, results in care everywhere.  (05-22-2019 per pt had symptoms of fatigue, headache, and loss of taste/ smell;  fatigue and headache after one week and loss taste/ smell resolved after 3 wks)   Infertility, female    Ovarian cyst    Pelvic mass in female    large fluid filled pelvic mass   Recurrent boils    Septate uterus    Trichomoniasis    hx of    Allergies  Allergen Reactions   Sulfa Antibiotics Other (See Comments)    Upset stomach - can tolerate     ROS: Negative except as per HPI above  Objective:  General: AAO x3, NAD  Dermatological: With inspection and palpation of the right and left lower extremities  there are no open sores, no preulcerative lesions, no rash or signs of infection present. Nails are of normal length thickness and coloration.   Vascular:  Dorsalis Pedis artery and Posterior Tibial artery pedal pulses are 2/4 bilateral.  Capillary fill time brisk < 3 sec. Pedal hair growth present. No varicosities and no lower extremity edema present bilateral. There is no pain with calf compression, swelling, warmth, erythema.   Neruologic: Grossly intact via light touch bilateral. Protective threshold intact to all sites bilateral. Patellar and Achilles deep tendon reflexes 2+ bilateral. Negative Babinski reflex.   Musculoskeletal: Minimal to no edema or erythema noted about the left ankle.  There is pain with palpation of the ankle joint anteriorly and especially anterior lateral.  Pain does not significantly worsen with ankle range of motion.  No pain with passive range of motion of the ankle  Gait: Unassisted, Nonantalgic.   No images are attached to the encounter.  Assessment:   1. Synovitis of left ankle   2. Effusion of left ankle   3. Pain of joint of left ankle and foot      Plan:  Patient was evaluated and treated and all questions answered.  #Effusion and synovitis of left ankle causing pain -Discussed with the patient that the work-up so far  has been unremarkable for infection or gout.  Recommend we proceed with steroid injection as she was not able to take oral steroid taper pack.  If the problem improved significantly will confirm inflammatory joint pain of unknown etiology. -Procedure: Left ankle steroid injection . perform injection of mixture 1 cc 0.5% Marcaine plain with 1 cc Kenalog 10.  After prepping the skin with alcohol swab I used 25-gauge needle to inject into the ankle joint from the anterior lateral portal the above mixture.  Patient tolerated procedure well.  Band-Aid was applied afterwards. -Additionally I recommend continued use of an ankle brace or walking  boot.  Offered the patient a Tri-Lock ankle brace however she says she already has one at home. -Patient will follow-up in 4 weeks to determine progress following steroid injection.  Return in about 4 weeks (around 11/27/2021) for Follow up left ankle pain.          Everitt Amber, DPM [facility] / [facility]

## 2021-10-30 NOTE — Patient Instructions (Signed)

## 2021-11-04 ENCOUNTER — Encounter: Payer: Self-pay | Admitting: Obstetrics & Gynecology

## 2021-11-05 ENCOUNTER — Telehealth: Payer: Self-pay | Admitting: Obstetrics & Gynecology

## 2021-11-05 NOTE — Telephone Encounter (Signed)
Tc to pts mother per pts request. Pt is in the background during the conversation.  Pts mother has concerns about possible bowel injury. I have reviewed that the risk of bowel surgery is a risk to every GYN surgery that we perform. Hers is higher because of the previously diagnosed scar tissue.   Pt has questions about going into menopause after surgery.   I reviewed the surgery and what would be removed the uterus with cervix, and fallopian tubes.   I confirmed that all of her questions were answered.    Lovena Kluck L. Harraway-Smith, M.D., [facility] June

## 2021-11-06 ENCOUNTER — Ambulatory Visit: Payer: Commercial Managed Care - HMO | Admitting: Podiatry

## 2021-11-06 ENCOUNTER — Encounter: Payer: Self-pay | Admitting: General Practice

## 2021-11-07 ENCOUNTER — Encounter (HOSPITAL_COMMUNITY)
Admission: RE | Admit: 2021-11-07 | Discharge: 2021-11-07 | Disposition: A | Discharge status: Home / Self Care | Payer: Commercial Managed Care - HMO | Source: Ambulatory Visit | Attending: Obstetrics & Gynecology | Admitting: Obstetrics & Gynecology

## 2021-11-07 DIAGNOSIS — Z01818 Encounter for other preprocedural examination: Secondary | ICD-10-CM

## 2021-11-07 DIAGNOSIS — Z01812 Encounter for preprocedural laboratory examination: Secondary | ICD-10-CM | POA: Insufficient documentation

## 2021-11-07 LAB — CBC
HCT: 38.7 % (ref 36.0–46.0)
Hemoglobin: 12.7 g/dL (ref 12.0–15.0)
MCH: 29.1 pg (ref 26.0–34.0)
MCHC: 32.8 g/dL (ref 30.0–36.0)
MCV: 88.6 fL (ref 80.0–100.0)
Platelets: 340 10*3/uL (ref 150–400)
RBC: 4.37 MIL/uL (ref 3.87–5.11)
RDW: 14.7 % (ref 11.5–15.5)
WBC: 5.3 10*3/uL (ref 4.0–10.5)
nRBC: 0 % (ref 0.0–0.2)

## 2021-11-11 ENCOUNTER — Ambulatory Visit (HOSPITAL_BASED_OUTPATIENT_CLINIC_OR_DEPARTMENT_OTHER): Payer: Commercial Managed Care - HMO | Admitting: Certified Registered Nurse Anesthetist

## 2021-11-11 ENCOUNTER — Ambulatory Visit (HOSPITAL_BASED_OUTPATIENT_CLINIC_OR_DEPARTMENT_OTHER)
Admission: RE | Admit: 2021-11-11 | Discharge: 2021-11-11 | Disposition: A | Discharge status: Home / Self Care | Payer: Commercial Managed Care - HMO | Attending: Obstetrics & Gynecology | Admitting: Obstetrics & Gynecology

## 2021-11-11 ENCOUNTER — Other Ambulatory Visit: Payer: Self-pay

## 2021-11-11 ENCOUNTER — Encounter (HOSPITAL_BASED_OUTPATIENT_CLINIC_OR_DEPARTMENT_OTHER)
Admission: RE | Disposition: A | Discharge status: Home / Self Care | Payer: Self-pay | Source: Home / Self Care | Attending: Obstetrics & Gynecology

## 2021-11-11 ENCOUNTER — Encounter (HOSPITAL_BASED_OUTPATIENT_CLINIC_OR_DEPARTMENT_OTHER): Payer: Self-pay | Admitting: Obstetrics & Gynecology

## 2021-11-11 DIAGNOSIS — K219 Gastro-esophageal reflux disease without esophagitis: Secondary | ICD-10-CM | POA: Insufficient documentation

## 2021-11-11 DIAGNOSIS — N736 Female pelvic peritoneal adhesions (postinfective): Secondary | ICD-10-CM | POA: Insufficient documentation

## 2021-11-11 DIAGNOSIS — N939 Abnormal uterine and vaginal bleeding, unspecified: Secondary | ICD-10-CM | POA: Diagnosis not present

## 2021-11-11 DIAGNOSIS — E669 Obesity, unspecified: Secondary | ICD-10-CM | POA: Insufficient documentation

## 2021-11-11 DIAGNOSIS — H8309 Labyrinthitis, unspecified ear: Secondary | ICD-10-CM | POA: Diagnosis present

## 2021-11-11 DIAGNOSIS — F17210 Nicotine dependence, cigarettes, uncomplicated: Secondary | ICD-10-CM | POA: Insufficient documentation

## 2021-11-11 DIAGNOSIS — D250 Submucous leiomyoma of uterus: Secondary | ICD-10-CM | POA: Insufficient documentation

## 2021-11-11 DIAGNOSIS — N8000 Endometriosis of the uterus, unspecified: Secondary | ICD-10-CM | POA: Diagnosis not present

## 2021-11-11 DIAGNOSIS — N994 Postprocedural pelvic peritoneal adhesions: Secondary | ICD-10-CM

## 2021-11-11 DIAGNOSIS — N80202 Endometriosis of left fallopian tube, unspecified depth: Secondary | ICD-10-CM | POA: Diagnosis not present

## 2021-11-11 DIAGNOSIS — Z6832 Body mass index (BMI) 32.0-32.9, adult: Secondary | ICD-10-CM | POA: Insufficient documentation

## 2021-11-11 DIAGNOSIS — Z9889 Other specified postprocedural states: Secondary | ICD-10-CM | POA: Diagnosis present

## 2021-11-11 DIAGNOSIS — Z01818 Encounter for other preprocedural examination: Secondary | ICD-10-CM

## 2021-11-11 DIAGNOSIS — N938 Other specified abnormal uterine and vaginal bleeding: Secondary | ICD-10-CM | POA: Insufficient documentation

## 2021-11-11 DIAGNOSIS — R102 Pelvic and perineal pain: Secondary | ICD-10-CM

## 2021-11-11 DIAGNOSIS — N920 Excessive and frequent menstruation with regular cycle: Secondary | ICD-10-CM | POA: Diagnosis present

## 2021-11-11 HISTORY — PX: ROBOTIC ASSISTED TOTAL HYSTERECTOMY: SHX6085

## 2021-11-11 HISTORY — DX: Ganglion, unspecified ankle and foot: M67.479

## 2021-11-11 HISTORY — DX: Unspecified osteoarthritis, unspecified site: M19.90

## 2021-11-11 HISTORY — PX: CYSTOSCOPY: SHX5120

## 2021-11-11 HISTORY — DX: Gastro-esophageal reflux disease without esophagitis: K21.9

## 2021-11-11 LAB — TYPE AND SCREEN
ABO/RH(D): B POS
Antibody Screen: NEGATIVE

## 2021-11-11 LAB — POCT PREGNANCY, URINE: Preg Test, Ur: NEGATIVE

## 2021-11-11 SURGERY — HYSTERECTOMY, TOTAL, ROBOT-ASSISTED
Anesthesia: General | Site: Bladder

## 2021-11-11 MED ORDER — PANTOPRAZOLE SODIUM 40 MG PO TBEC
DELAYED_RELEASE_TABLET | ORAL | Status: AC
  Filled 2021-11-11: qty 1

## 2021-11-11 MED ORDER — SODIUM CHLORIDE 0.9 % IV SOLN
INTRAVENOUS | Status: AC
  Filled 2021-11-11: qty 2

## 2021-11-11 MED ORDER — HYDROMORPHONE HCL 1 MG/ML IJ SOLN
INTRAMUSCULAR | Status: DC | PRN
  Administered 2021-11-11 (×2): .5 mg via INTRAVENOUS

## 2021-11-11 MED ORDER — MIDAZOLAM HCL 2 MG/2ML IJ SOLN
INTRAMUSCULAR | Status: AC
  Filled 2021-11-11: qty 2

## 2021-11-11 MED ORDER — SODIUM CHLORIDE 0.9 % IV SOLN
INTRAVENOUS | Status: AC
  Filled 2021-11-11: qty 100

## 2021-11-11 MED ORDER — FENTANYL CITRATE (PF) 250 MCG/5ML IJ SOLN
INTRAMUSCULAR | Status: AC
  Filled 2021-11-11: qty 5

## 2021-11-11 MED ORDER — PROPOFOL 10 MG/ML IV BOLUS
INTRAVENOUS | Status: DC | PRN
  Administered 2021-11-11: 150 mg via INTRAVENOUS

## 2021-11-11 MED ORDER — KETOROLAC TROMETHAMINE 30 MG/ML IJ SOLN
30.0000 mg | Freq: Four times a day (QID) | INTRAMUSCULAR | Status: DC
  Administered 2021-11-11: 30 mg via INTRAVENOUS

## 2021-11-11 MED ORDER — ACETAMINOPHEN 500 MG PO TABS
1000.0000 mg | ORAL_TABLET | ORAL | Status: AC
  Administered 2021-11-11: 1000 mg via ORAL

## 2021-11-11 MED ORDER — ACETAMINOPHEN 500 MG PO TABS
ORAL_TABLET | ORAL | Status: AC
  Filled 2021-11-11: qty 2

## 2021-11-11 MED ORDER — KETOROLAC TROMETHAMINE 30 MG/ML IJ SOLN
INTRAMUSCULAR | Status: DC | PRN
  Administered 2021-11-11: 30 mg via INTRAVENOUS

## 2021-11-11 MED ORDER — DEXAMETHASONE SODIUM PHOSPHATE 10 MG/ML IJ SOLN
INTRAMUSCULAR | Status: DC | PRN
  Administered 2021-11-11: 10 mg via INTRAVENOUS

## 2021-11-11 MED ORDER — PROPOFOL 10 MG/ML IV BOLUS
INTRAVENOUS | Status: AC
  Filled 2021-11-11: qty 20

## 2021-11-11 MED ORDER — ROCURONIUM BROMIDE 10 MG/ML (PF) SYRINGE
PREFILLED_SYRINGE | INTRAVENOUS | Status: DC | PRN
  Administered 2021-11-11: 70 mg via INTRAVENOUS
  Administered 2021-11-11 (×2): 20 mg via INTRAVENOUS

## 2021-11-11 MED ORDER — POVIDONE-IODINE 10 % EX SWAB: 2.0000 | Freq: Once | CUTANEOUS | Status: DC

## 2021-11-11 MED ORDER — PROMETHAZINE HCL 25 MG/ML IJ SOLN: 6.2500 mg | INTRAMUSCULAR | Status: DC | PRN

## 2021-11-11 MED ORDER — HYDROMORPHONE HCL 2 MG/ML IJ SOLN
INTRAMUSCULAR | Status: AC
  Filled 2021-11-11: qty 1

## 2021-11-11 MED ORDER — SUGAMMADEX SODIUM 200 MG/2ML IV SOLN
INTRAVENOUS | Status: DC | PRN
  Administered 2021-11-11: 200 mg via INTRAVENOUS

## 2021-11-11 MED ORDER — LIDOCAINE 2% (20 MG/ML) 5 ML SYRINGE
INTRAMUSCULAR | Status: DC | PRN
  Administered 2021-11-11: 40 mg via INTRAVENOUS

## 2021-11-11 MED ORDER — HYDROMORPHONE HCL 1 MG/ML IJ SOLN
0.2500 mg | INTRAMUSCULAR | Status: DC | PRN
  Administered 2021-11-11 (×2): 0.5 mg via INTRAVENOUS

## 2021-11-11 MED ORDER — LACTATED RINGERS IV SOLN: INTRAVENOUS | Status: DC | PRN

## 2021-11-11 MED ORDER — HYDROMORPHONE HCL 1 MG/ML IJ SOLN
INTRAMUSCULAR | Status: AC
  Filled 2021-11-11: qty 1

## 2021-11-11 MED ORDER — MIDAZOLAM HCL 2 MG/2ML IJ SOLN: 0.5000 mg | Freq: Once | INTRAMUSCULAR | Status: DC | PRN

## 2021-11-11 MED ORDER — OXYCODONE-ACETAMINOPHEN 5-325 MG PO TABS: 1.0000 | ORAL_TABLET | ORAL | Status: DC | PRN

## 2021-11-11 MED ORDER — ZOLPIDEM TARTRATE 5 MG PO TABS: 5.0000 mg | ORAL_TABLET | Freq: Every evening | ORAL | Status: DC | PRN

## 2021-11-11 MED ORDER — CEPHALEXIN 500 MG PO CAPS: 500.0000 mg | ORAL_CAPSULE | Freq: Three times a day (TID) | ORAL | 0 refills | Status: DC

## 2021-11-11 MED ORDER — SIMETHICONE 80 MG PO CHEW: 80.0000 mg | CHEWABLE_TABLET | Freq: Four times a day (QID) | ORAL | Status: DC | PRN

## 2021-11-11 MED ORDER — DOCUSATE SODIUM 100 MG PO CAPS: 100.0000 mg | ORAL_CAPSULE | Freq: Two times a day (BID) | ORAL | 0 refills | Status: DC

## 2021-11-11 MED ORDER — OXYCODONE HCL 5 MG/5ML PO SOLN: 5.0000 mg | Freq: Once | ORAL | Status: DC | PRN

## 2021-11-11 MED ORDER — HYDROMORPHONE HCL 1 MG/ML IJ SOLN
0.2000 mg | INTRAMUSCULAR | Status: DC | PRN
  Administered 2021-11-11: 0.5 mg via INTRAVENOUS

## 2021-11-11 MED ORDER — KETOROLAC TROMETHAMINE 30 MG/ML IJ SOLN
INTRAMUSCULAR | Status: AC
  Filled 2021-11-11: qty 1

## 2021-11-11 MED ORDER — LACTATED RINGERS IV SOLN: INTRAVENOUS | Status: DC

## 2021-11-11 MED ORDER — MENTHOL 3 MG MT LOZG: 1.0000 | LOZENGE | OROMUCOSAL | Status: DC | PRN

## 2021-11-11 MED ORDER — MEPERIDINE HCL 25 MG/ML IJ SOLN
6.2500 mg | INTRAMUSCULAR | Status: DC | PRN
  Administered 2021-11-11: 12.5 mg via INTRAVENOUS

## 2021-11-11 MED ORDER — DIPHENHYDRAMINE HCL 50 MG/ML IJ SOLN
INTRAMUSCULAR | Status: DC | PRN
  Administered 2021-11-11: 12.5 mg via INTRAVENOUS

## 2021-11-11 MED ORDER — MEPERIDINE HCL 25 MG/ML IJ SOLN
INTRAMUSCULAR | Status: AC
  Filled 2021-11-11: qty 1

## 2021-11-11 MED ORDER — ONDANSETRON HCL 4 MG/2ML IJ SOLN
INTRAMUSCULAR | Status: DC | PRN
  Administered 2021-11-11: 4 mg via INTRAVENOUS

## 2021-11-11 MED ORDER — POLYETHYLENE GLYCOL 3350 17 G PO PACK: 17.0000 g | PACK | Freq: Every day | ORAL | Status: DC | PRN

## 2021-11-11 MED ORDER — BISACODYL 5 MG PO TBEC: 5.0000 mg | DELAYED_RELEASE_TABLET | Freq: Every day | ORAL | Status: DC | PRN

## 2021-11-11 MED ORDER — SOD CITRATE-CITRIC ACID 500-334 MG/5ML PO SOLN: 30.0000 mL | ORAL | Status: DC

## 2021-11-11 MED ORDER — CEFOXITIN SODIUM 2 G IV SOLR
INTRAVENOUS | Status: AC
  Filled 2021-11-11: qty 2

## 2021-11-11 MED ORDER — IBUPROFEN 800 MG PO TABS: 800.0000 mg | ORAL_TABLET | Freq: Three times a day (TID) | ORAL | 0 refills | Status: DC | PRN

## 2021-11-11 MED ORDER — HEMOSTATIC AGENTS (NO CHARGE) OPTIME
TOPICAL | Status: DC | PRN
  Administered 2021-11-11: 1

## 2021-11-11 MED ORDER — DOCUSATE SODIUM 100 MG PO CAPS
100.0000 mg | ORAL_CAPSULE | Freq: Two times a day (BID) | ORAL | Status: DC
  Administered 2021-11-11: 100 mg via ORAL

## 2021-11-11 MED ORDER — ONDANSETRON HCL 4 MG/2ML IJ SOLN: 4.0000 mg | Freq: Four times a day (QID) | INTRAMUSCULAR | Status: DC | PRN

## 2021-11-11 MED ORDER — DOCUSATE SODIUM 100 MG PO CAPS
ORAL_CAPSULE | ORAL | Status: AC
  Filled 2021-11-11: qty 1

## 2021-11-11 MED ORDER — ONDANSETRON HCL 4 MG PO TABS: 4.0000 mg | ORAL_TABLET | Freq: Four times a day (QID) | ORAL | Status: DC | PRN

## 2021-11-11 MED ORDER — IBUPROFEN 200 MG PO TABS: 600.0000 mg | ORAL_TABLET | Freq: Four times a day (QID) | ORAL | Status: DC

## 2021-11-11 MED ORDER — PANTOPRAZOLE SODIUM 40 MG PO TBEC
40.0000 mg | DELAYED_RELEASE_TABLET | Freq: Every day | ORAL | Status: DC
  Administered 2021-11-11: 40 mg via ORAL

## 2021-11-11 MED ORDER — STERILE WATER FOR IRRIGATION IR SOLN
Status: DC | PRN
  Administered 2021-11-11: 500 mL

## 2021-11-11 MED ORDER — BUPIVACAINE HCL (PF) 0.5 % IJ SOLN
INTRAMUSCULAR | Status: DC | PRN
  Administered 2021-11-11: 30 mL

## 2021-11-11 MED ORDER — OXYCODONE-ACETAMINOPHEN 5-325 MG PO TABS: 1.0000 | ORAL_TABLET | Freq: Four times a day (QID) | ORAL | 0 refills | Status: DC | PRN

## 2021-11-11 MED ORDER — SODIUM CHLORIDE 0.9 % IV SOLN
2.0000 g | INTRAVENOUS | Status: AC
  Administered 2021-11-11: 2 g via INTRAVENOUS

## 2021-11-11 MED ORDER — PROPOFOL 500 MG/50ML IV EMUL
INTRAVENOUS | Status: DC | PRN
  Administered 2021-11-11: 25 ug/kg/min via INTRAVENOUS

## 2021-11-11 MED ORDER — FENTANYL CITRATE (PF) 250 MCG/5ML IJ SOLN
INTRAMUSCULAR | Status: DC | PRN
  Administered 2021-11-11: 50 ug via INTRAVENOUS
  Administered 2021-11-11: 25 ug via INTRAVENOUS
  Administered 2021-11-11: 100 ug via INTRAVENOUS
  Administered 2021-11-11: 25 ug via INTRAVENOUS

## 2021-11-11 MED ORDER — OXYCODONE HCL 5 MG PO TABS: 5.0000 mg | ORAL_TABLET | Freq: Once | ORAL | Status: DC | PRN

## 2021-11-11 MED ORDER — MIDAZOLAM HCL 2 MG/2ML IJ SOLN
INTRAMUSCULAR | Status: DC | PRN
  Administered 2021-11-11: 2 mg via INTRAVENOUS

## 2021-11-11 MED ORDER — SODIUM CHLORIDE 0.9 % IR SOLN
Status: DC | PRN
  Administered 2021-11-11: 400 mL via INTRAVESICAL

## 2021-11-11 MED ORDER — SODIUM CHLORIDE 0.9 % IR SOLN
Status: DC | PRN
  Administered 2021-11-11: 250 mL

## 2021-11-11 MED ORDER — DEXTROSE-NACL 5-0.45 % IV SOLN: INTRAVENOUS | Status: AC

## 2021-11-11 SURGICAL SUPPLY — 78 items
ADH SKN CLS APL DERMABOND .7 (GAUZE/BANDAGES/DRESSINGS) ×2
APL SRG 38 LTWT LNG FL B (MISCELLANEOUS) ×2
APPLICATOR ARISTA FLEXITIP XL (MISCELLANEOUS) IMPLANT
APPLIER CLIP 5 13 M/L LIGAMAX5 (MISCELLANEOUS)
APR CLP MED LRG 5 ANG JAW (MISCELLANEOUS)
BARRIER ADHS 3X4 INTERCEED (GAUZE/BANDAGES/DRESSINGS) IMPLANT
BRR ADH 4X3 ABS CNTRL BYND (GAUZE/BANDAGES/DRESSINGS)
CABLE HIGH FREQUENCY MONO STRZ (ELECTRODE) IMPLANT
CATH FOLEY 3WAY  5CC 16FR (CATHETERS) ×2
CATH FOLEY 3WAY 5CC 16FR (CATHETERS) ×3 IMPLANT
CLIP APPLIE 5 13 M/L LIGAMAX5 (MISCELLANEOUS) IMPLANT
COVER BACK TABLE 60X90IN (DRAPES) ×3 IMPLANT
COVER TIP SHEARS 8 DVNC (MISCELLANEOUS) ×3 IMPLANT
COVER TIP SHEARS 8MM DA VINCI (MISCELLANEOUS) ×2
DEFOGGER SCOPE WARMER CLEARIFY (MISCELLANEOUS) ×3 IMPLANT
DERMABOND ADVANCED .7 DNX12 (GAUZE/BANDAGES/DRESSINGS) ×3 IMPLANT
DRAPE ARM DVNC X/XI (DISPOSABLE) ×9 IMPLANT
DRAPE COLUMN DVNC XI (DISPOSABLE) ×3 IMPLANT
DRAPE DA VINCI XI ARM (DISPOSABLE) ×6
DRAPE DA VINCI XI COLUMN (DISPOSABLE) ×2
DRAPE UTILITY XL STRL (DRAPES) ×3 IMPLANT
DURAPREP 26ML APPLICATOR (WOUND CARE) ×3 IMPLANT
ELECT REM PT RETURN 9FT ADLT (ELECTROSURGICAL) ×2
ELECTRODE REM PT RTRN 9FT ADLT (ELECTROSURGICAL) ×3 IMPLANT
GAUZE 4X4 16PLY ~~LOC~~+RFID DBL (SPONGE) ×6 IMPLANT
GLOVE BIO SURGEON STRL SZ7 (GLOVE) ×9 IMPLANT
GLOVE BIOGEL PI IND STRL 7.0 (GLOVE) ×9 IMPLANT
HEMOSTAT ARISTA ABSORB 3G PWDR (HEMOSTASIS) IMPLANT
HOLDER FOLEY CATH W/STRAP (MISCELLANEOUS) IMPLANT
IRRIG SUCT STRYKERFLOW 2 WTIP (MISCELLANEOUS) ×2
IRRIGATION SUCT STRKRFLW 2 WTP (MISCELLANEOUS) ×3 IMPLANT
IV NS 1000ML (IV SOLUTION) ×2
IV NS 1000ML BAXH (IV SOLUTION) IMPLANT
KIT TURNOVER CYSTO (KITS) ×3 IMPLANT
LEGGING LITHOTOMY PAIR STRL (DRAPES) ×3 IMPLANT
MANIPULATOR ADVINCU DEL 2.5 PL (MISCELLANEOUS) IMPLANT
MANIPULATOR ADVINCU DEL 3.0 PL (MISCELLANEOUS) IMPLANT
MANIPULATOR ADVINCU DEL 3.5 PL (MISCELLANEOUS) IMPLANT
MANIPULATOR ADVINCU DEL 4.0 PL (MISCELLANEOUS) IMPLANT
NDL INSUFFLATION 14GA 120MM (NEEDLE) ×3 IMPLANT
NEEDLE INSUFFLATION 14GA 120MM (NEEDLE) ×2 IMPLANT
OBTURATOR OPTICAL STANDARD 8MM (TROCAR) ×2
OBTURATOR OPTICAL STND 8 DVNC (TROCAR) ×2
OBTURATOR OPTICALSTD 8 DVNC (TROCAR) ×3 IMPLANT
OCCLUDER COLPOPNEUMO (BALLOONS) ×3 IMPLANT
PACK ROBOT WH (CUSTOM PROCEDURE TRAY) ×3 IMPLANT
PACK ROBOTIC GOWN (GOWN DISPOSABLE) ×3 IMPLANT
PACK TRENDGUARD 450 HYBRID PRO (MISCELLANEOUS) IMPLANT
PAD OB MATERNITY 4.3X12.25 (PERSONAL CARE ITEMS) ×3 IMPLANT
PAD PREP 24X48 CUFFED NSTRL (MISCELLANEOUS) ×3 IMPLANT
PROTECTOR NERVE ULNAR (MISCELLANEOUS) ×6 IMPLANT
SCISSORS LAP 5X35 DISP (ENDOMECHANICALS) IMPLANT
SEAL CANN UNIV 5-8 DVNC XI (MISCELLANEOUS) ×9 IMPLANT
SEAL XI 5MM-8MM UNIVERSAL (MISCELLANEOUS) ×6
SEALER VESSEL DA VINCI XI (MISCELLANEOUS) ×2
SEALER VESSEL EXT DVNC XI (MISCELLANEOUS) ×3 IMPLANT
SET IRRIG Y TYPE TUR BLADDER L (SET/KITS/TRAYS/PACK) ×3 IMPLANT
SET TRI-LUMEN FLTR TB AIRSEAL (TUBING) ×3 IMPLANT
SPIKE FLUID TRANSFER (MISCELLANEOUS) ×3 IMPLANT
SPONGE T-LAP 4X18 ~~LOC~~+RFID (SPONGE) ×3 IMPLANT
SUT VIC AB 0 CT1 27 (SUTURE) ×4
SUT VIC AB 0 CT1 27XBRD ANBCTR (SUTURE) ×6 IMPLANT
SUT VIC AB 4-0 PS2 18 (SUTURE) ×6 IMPLANT
SUT VICRYL 0 UR6 27IN ABS (SUTURE) IMPLANT
SUT VLOC 180 0 9IN  GS21 (SUTURE) ×2
SUT VLOC 180 0 9IN GS21 (SUTURE) ×3 IMPLANT
SYSTEM CARTER THOMASON II (TROCAR) IMPLANT
TIP RUMI ORANGE 6.7MMX12CM (TIP) ×3 IMPLANT
TIP UTERINE 5.1X6CM LAV DISP (MISCELLANEOUS) ×3 IMPLANT
TIP UTERINE 6.7X10CM GRN DISP (MISCELLANEOUS) ×3 IMPLANT
TIP UTERINE 6.7X6CM WHT DISP (MISCELLANEOUS) ×3 IMPLANT
TIP UTERINE 6.7X8CM BLUE DISP (MISCELLANEOUS) ×3 IMPLANT
TOWEL OR 17X26 10 PK STRL BLUE (TOWEL DISPOSABLE) ×3 IMPLANT
TRENDGUARD 450 HYBRID PRO PACK (MISCELLANEOUS) ×2
TROCAR PORT AIRSEAL 5X120 (TROCAR) ×3 IMPLANT
TROCAR Z-THREAD FIOS 5X100MM (TROCAR) IMPLANT
WATER STERILE IRR 1000ML POUR (IV SOLUTION) ×3 IMPLANT
WATER STERILE IRR 500ML POUR (IV SOLUTION) IMPLANT

## 2021-11-11 NOTE — H&P (Signed)
Preoperative History and Physical  [name] is a 41 y.o. G0P0 here for surgical management of AUB and pelvic pain; bilateral hydrosalpinx.   Proposed surgery: Robot assisted total laparoscopic hysterectomy with bilateral salpingectomy.   Past Medical History:  Diagnosis Date   Arthritis    left ankle   COVID-19 09/24/2020   flu like symptoms per pt   Endometriosis    w/abnormal uterine bleeding, Pt follows with Dr. Margaretmary Dys, [place] 08/27/21 as of 10/22/21.   Ganglion cyst of foot    left foot, hx of aspiration, Follows with Dr. Everardo Beals, Sports Medicine, lov 10/06/21   GERD (gastroesophageal reflux disease)    occasional with spicy foods   History of 2019 novel coronavirus disease (COVID-19)    pt tested positive 03-03-2019, results in care everywhere.  (05-22-2019 per pt had symptoms of fatigue, headache, and loss of taste/ smell;  fatigue and headache after one week and loss taste/ smell resolved after 3 wks)   Infertility, female    Ovarian cyst    Pelvic mass in female    large fluid filled pelvic mass   Recurrent boils    Septate uterus    Trichomoniasis    hx of   Past Surgical History:  Procedure Laterality Date   BREAST BIOPSY Right 1999   per pt benign    DILITATION & CURRETTAGE/HYSTROSCOPY WITH NOVASURE ABLATION N/A 05/08/2021   Procedure: HYSTEROSCOPY, [place], ENDOMETRIAL ABLATION WITH NOVASURE;  Surgeon: Lavonia Drafts, MD;  Location: [facility];  Service: Gynecology;  Laterality: N/A;   INCISION AND DRAINAGE     right axilla, 2019, 2020, 2022, 2023   LAPAROSCOPIC SALPINGOOPHERECTOMY Right 03-25-2010   '@[facility]'$    and LYSIS ADHESIONS   LAPAROSCOPY N/A 05/30/2019   Procedure: DIAGNOSTIC LAPAROSCOPY;  Surgeon: Woodroe Mode, MD;  Location: [facility];  Service: Gynecology;  Laterality: N/A;   OB History     Gravida  0   Para      Term      Preterm      AB      Living  0      SAB      IAB       Ectopic      Multiple      Live Births             Patient denies any cervical dysplasia or STIs. Medications Prior to Admission  Medication Sig Dispense Refill Last Dose   acetaminophen (TYLENOL) 500 MG tablet Take 1,000 mg by mouth every 8 (eight) hours as needed for moderate pain.   Past Week   ibuprofen (ADVIL) 800 MG tablet Take 1 tablet (800 mg total) by mouth every 8 (eight) hours as needed. 30 tablet 2 Past Week   metroNIDAZOLE (FLAGYL PO) Take by mouth as needed.   Past Week   norethindrone (AYGESTIN) 5 MG tablet Take 1 tablet (5 mg total) by mouth daily. (Patient taking differently: Take 5 mg by mouth daily. Stopped about 3 weeks ago per pt on 10/28/21.) 30 tablet 2 Past Month   pseudoephedrine (SUDAFED) 60 MG tablet Take 1 tablet (60 mg total) by mouth every 6 (six) hours as needed for congestion. 30 tablet 0 Not Taking    Allergies  Allergen Reactions   Sulfa Antibiotics Other (See Comments)    Upset stomach - can tolerate    Social History:   reports that she has been smoking cigarettes. She has a 10.00 pack-year smoking  history. She has never used smokeless tobacco. She reports current alcohol use. She reports that she does not use drugs. Family History  Problem Relation Age of Onset   Hypertension Mother    Diabetes Mother    Cancer Father    Hypertension Father    Diabetes Brother    Hypertension Brother    Breast cancer Neg Hx     Review of Systems: Noncontributory  PHYSICAL EXAM: Blood pressure 126/89, pulse 91, temperature 98.1 F (36.7 C), temperature source Oral, resp. rate 18, height '5\' 2"'$  (1.575 m), weight 80.7 kg, SpO2 100 %. General appearance - alert, well appearing, and in no distress Chest - clear to auscultation, no wheezes, rales or rhonchi, symmetric air entry Heart - normal rate and regular rhythm Abdomen - soft, nontender, nondistended, no masses or organomegaly Pelvic - examination not indicated Extremities - peripheral pulses normal, no  pedal edema, no clubbing or cyanosis  Labs: Results for orders placed or performed during the hospital encounter of 11/11/21 (from the past 336 hour(s))  Pregnancy, urine POC   Collection Time: 11/11/21  7:39 AM  Result Value Ref Range   Preg Test, Ur NEGATIVE NEGATIVE  Results for orders placed or performed during the hospital encounter of 11/07/21 (from the past 336 hour(s))  CBC   Collection Time: 11/07/21  8:45 AM  Result Value Ref Range   WBC 5.3 4.0 - 10.5 K/uL   RBC 4.37 3.87 - 5.11 MIL/uL   Hemoglobin 12.7 12.0 - 15.0 g/dL   HCT 38.7 36.0 - 46.0 %   MCV 88.6 80.0 - 100.0 fL   MCH 29.1 26.0 - 34.0 pg   MCHC 32.8 30.0 - 36.0 g/dL   RDW 14.7 11.5 - 15.5 %   Platelets 340 150 - 400 K/uL   nRBC 0.0 0.0 - 0.2 %  Type and screen [place]   Collection Time: 11/07/21  8:45 AM  Result Value Ref Range   ABO/RH(D) B POS    Antibody Screen NEG    Sample Expiration 11/14/2021,2359    Extend sample reason      NO TRANSFUSIONS OR PREGNANCY IN THE PAST 3 MONTHS Performed at [facility] [place] [address] [place], [place] [address]     Imaging Studies:  08/20/2021 CLINICAL DATA:  Ovarian cyst, bleeding, prior endometrial ablation   EXAM: TRANSABDOMINAL AND TRANSVAGINAL ULTRASOUND OF PELVIS   TECHNIQUE: Both transabdominal and transvaginal ultrasound examinations of the pelvis were performed. Transabdominal technique was performed for global imaging of the pelvis including uterus, ovaries, adnexal regions, and pelvic cul-de-sac. It was necessary to proceed with endovaginal exam following the transabdominal exam to visualize the endometrium and ovaries.   COMPARISON:  07/02/2021   FINDINGS: Uterus   Measurements: 8.9 x 4.6 x 6.4 cm = volume: 136 mL. Anteverted. Two large leiomyomata at upper and mid uterus, both submucosal, measuring 3.2 cm and 3.8 cm in greatest sizes. Additional small intramural leiomyoma at upper uterus 5 mm  diameter.   Endometrium   Thickness: 7 mm.  No endometrial fluid or mass   Right ovary   Not visualized, likely obscured by bowel   Left ovary   Measurements: 4.0 x 1.7 x 3.9 cm = volume: 13.6 mL. Hemorrhagic cyst LEFT ovary, 3.3 cm greatest diameter, decreased from 4.3 cm on prior study; no follow-up imaging recommended.   Other findings   No free pelvic fluid or adnexal masses. Tubular structure identified in RIGHT adnexa 1.7 cm diameter consistent  with segment of hydrosalpinx.   IMPRESSION: Post RIGHT oophorectomy with segment of RIGHT hydrosalpinx noted.   Two submucosal leiomyomata at mid to upper uterus.   Assessment: Patient Active Problem List   Diagnosis Date Noted   Post-operative state 11/11/2021   Pain of joint of left ankle and foot 10/06/2021   Screen for STD (sexually transmitted disease) 09/01/2021   Fatigue 09/01/2021   Abnormal uterine bleeding (AUB) 05/08/2021   Exposure to sexually transmitted disease (STD) 03/06/2021   Molluscum contagiosum 12/19/2020   Abdominal pain 08/16/2020   Constipation 08/16/2020   Frequent urination 06/24/2020   Hydradenitis 06/24/2020   Ganglion cyst of left foot 06/13/2020   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   Menorrhagia 12/25/2019   Vaginal discharge 07/08/2019   GERD (gastroesophageal reflux disease) 06/07/2019   HSV-2 seropositive 03/07/2019   Hydrosalpinx 12/30/2018   Endometriosis 05/01/2011   SEPTATE UTERUS 07/24/2009   TOBACCO ABUSE 09/26/2008   Female infertility 09/26/2008   Menometrorrhagia 09/21/2006    Plan: Patient will undergo surgical management with Robot assisted total laparoscopic hysterectomy with bilateral salpingectomy or bilateral if both tubes. present.   The risks of surgery were discussed in detail with the patient including but not limited to: bleeding which may require transfusion or reoperation; infection which may require antibiotics; injury to surrounding organs which may involve bowel,  bladder, ureters ; need for additional procedures including laparoscopy or laparotomy; thromboembolic phenomenon, surgical site problems and other postoperative/anesthesia complications. Likelihood of success in alleviating the patient's condition was discussed. Routine postoperative instructions will be reviewed with the patient and her family in detail after surgery.  The patient concurred with the proposed plan, giving informed written consent for the surgery.  Patient has been NPO since last night she will remain NPO for procedure.  Anesthesia and OR aware.  Preoperative prophylactic antibiotics and SCDs ordered on call to the OR.  To OR when ready.  Kesley Mullens L. Harraway-Smith, M.D., [facility] 11/11/2021 10:22 AM

## 2021-11-11 NOTE — Transfer of Care (Signed)
Immediate Anesthesia Transfer of Care Note  Patient: [name]  Procedure(s) Performed: XI ROBOTIC ASSISTED TOTAL HYSTERECTOMY WITH LEFT SALPINGECTOMY (Abdomen) CYSTOSCOPY (Bladder)  Patient Location: PACU  Anesthesia Type:General  Level of Consciousness: awake, alert  and oriented  Airway & Oxygen Therapy: Patient Spontanous Breathing  Post-op Assessment: Report given to RN and Post -op Vital signs reviewed and stable  Post vital signs: Reviewed and stable  Last Vitals:  Vitals Value Taken Time  BP 139/86 11/11/21 1315  Temp    Pulse 74 11/11/21 1317  Resp 16 11/11/21 1306  SpO2 99 % 11/11/21 1317  Vitals shown include unvalidated device data.  Last Pain:  Vitals:   11/11/21 0813  TempSrc: Oral  PainSc: 0-No pain      Patients Stated Pain Goal: 4 (60/63/01 6010)  Complications: No notable events documented.

## 2021-11-11 NOTE — Op Note (Signed)
11/11/2021  1:13 PM  PATIENT:  [name]  41 y.o. female  PRE-OPERATIVE DIAGNOSIS:  AUB  POST-OPERATIVE DIAGNOSIS:  AUB  PROCEDURE:  Procedure(s): XI ROBOTIC ASSISTED TOTAL HYSTERECTOMY WITH LEFT SALPINGECTOMY (N/A) CYSTOSCOPY (N/A)  SURGEON:  Surgeon(s) and Role:    * Lavonia Drafts, MD - Primary    * Constant, Peggy, MD - Assisting  ANESTHESIA:   general  EBL:  50 mL   BLOOD ADMINISTERED:none  DRAINS: none   LOCAL MEDICATIONS USED:  MARCAINE     SPECIMEN:  Source of Specimen:  uterus, cervix and left fallopian tube  DISPOSITION OF SPECIMEN:  PATHOLOGY  COUNTS:  YES  TOURNIQUET:  * No tourniquets in log *  DICTATION: .Note written in EPIC  PLAN OF CARE: prolonged observation and discharge later today   PATIENT DISPOSITION:  PACU - hemodynamically stable.   Delay start of Pharmacological VTE agent (>24hrs) due to surgical blood loss or risk of bleeding: yes  Complications: none immediate.   Indications; 41 yo G0 with h/o pelvic pain and AUB. Failed conservative measures.   Procedures: The risks, benefits, and alternatives of surgery were explained, understood, and accepted. Consents were signed. All questions were answered. She was taken to the operating room and general anesthesia was applied without complication. She was placed in the dorsal lithotomy position and her abdomen and vagina were prepped and draped after she had been carefully positioned on the table. A bimanual exam revealed a 14 week size uterus that was mobile. Her adnexa were not enlarged. The cervix was measured and the uterus was sounded to 10 cm. A Rumi uterine manipulator was placed without difficulty. A Foley catheter was placed and it drained clear throughout the case. Gloves were changed and attention was turned to the abdomen. A 45m incision was made in the umbilicus and a Veress needle was placed intraperitoneally. CO2 was used to insufflate the abdomen to approximately 4 L.  After good pneumoperitoneum was established, a 12 mm trocar was placed in the umbilicus.  Laparoscopy confirmed correct placement. She was placed in Trendelenburg position and ports were placed in appropriate positions on her abdomen to allow maximum exposure during the robotic case. Specifically there was an 585massistant port placed in the left lower quadrant under direct laproscopic visualization. Two 8 mm ports were placed 8cm lateral to the midline port.  These were all placed under direct laparoscopic visualization. The robot was docked and I proceeded with a robotic portion of the case.  The pelvis was inspected and the uterus was found to have fibroids and be enlarged.  The fallopian tubes and ovaries were found to be normal. The remainder of her pelvis appeared normal with the exception of adhesions of bowel to the adnexa and sidewall on the left side.  These were released sharply. The ureters and the infundibulopelvic ligaments were identified. I excised the fallopian tubes bilaterally. The round ligament on each side was cauterized and cut. The PK/gyrus instrument was used for this portion. The round ligaments were identified, cauterized and ligated, a bladder flap was created anteriorly. The uterine vessels were identified and cauterized and then cut.The bladder was pushed out of the operative site and an anterior colpotomy was made. The colpotomy incision was extended circumferentially, following the blue outline of the Rumi manipulator. All pedicles were hemostatic.  The uterus was removed from the vagina with the fallopian tube segments. The vaginal cuff was closed with v-lock suture.  Excellent hemostasis was noted throughout. The pelvis  was irrigated. The intraabdominal pressure was lowered assess hemostasis. After determining excellent hemostasis, the robot was undocked. At this point I performed cystoscopy. The cystoscopy revealed blue ejection from both ureters.  The midline fascial incision  was closed with 0 vicryl.  The skin from all of the other ports was closed with 3-0 vicryl. 30cc of 0.5% Marcaine was injected into the port sites.  The patient was then extubated and taken to recovery in stable condition.   Sponge, lap and needle counts were correct x 2.

## 2021-11-11 NOTE — Anesthesia Preprocedure Evaluation (Addendum)
Anesthesia Evaluation  Patient identified by MRN, date of birth, ID band Patient awake    Reviewed: Allergy & Precautions, NPO status , Patient's Chart, lab work & pertinent test results  History of Anesthesia Complications Negative for: history of anesthetic complications  Airway Mallampati: II  TM Distance: >3 FB Neck ROM: Full    Dental  (+) Dental Advisory Given   Pulmonary Current Smoker and Patient abstained from smoking.,    breath sounds clear to auscultation       Cardiovascular negative cardio ROS   Rhythm:Regular Rate:Normal     Neuro/Psych negative neurological ROS  negative psych ROS   GI/Hepatic Neg liver ROS, GERD  Controlled,  Endo/Other  BMI 32  Renal/GU negative Renal ROS     Musculoskeletal   Abdominal (+) + obese,   Peds  Hematology negative hematology ROS (+)   Anesthesia Other Findings   Reproductive/Obstetrics Ovarian cyst endometriosis                            Anesthesia Physical Anesthesia Plan  ASA: 2  Anesthesia Plan: General   Post-op Pain Management: Tylenol PO (pre-op)*   Induction: Intravenous  PONV Risk Score and Plan: 2 and Ondansetron, Dexamethasone and Scopolamine patch - Pre-op  Airway Management Planned: Oral ETT  Additional Equipment: None  Intra-op Plan:   Post-operative Plan: Extubation in OR  Informed Consent: I have reviewed the patients History and Physical, chart, labs and discussed the procedure including the risks, benefits and alternatives for the proposed anesthesia with the patient or authorized representative who has indicated his/her understanding and acceptance.     Dental advisory given  Plan Discussed with: CRNA and Surgeon  Anesthesia Plan Comments:        Anesthesia Quick Evaluation

## 2021-11-11 NOTE — Brief Op Note (Signed)
11/11/2021  1:13 PM  PATIENT:  [name]  41 y.o. female  PRE-OPERATIVE DIAGNOSIS:  AUB  POST-OPERATIVE DIAGNOSIS:  AUB  PROCEDURE:  Procedure(s): XI ROBOTIC ASSISTED TOTAL HYSTERECTOMY WITH LEFT SALPINGECTOMY (N/A) CYSTOSCOPY (N/A)  SURGEON:  Surgeon(s) and Role:    * Lavonia Drafts, MD - Primary    * Constant, Peggy, MD - Assisting  ANESTHESIA:   general  EBL:  50 mL   BLOOD ADMINISTERED:none  DRAINS: none   LOCAL MEDICATIONS USED:  MARCAINE     SPECIMEN:  Source of Specimen:  uterus, cervix and left fallopian tube  DISPOSITION OF SPECIMEN:  PATHOLOGY  COUNTS:  YES  TOURNIQUET:  * No tourniquets in log *  DICTATION: .Note written in EPIC  PLAN OF CARE:  prolonged observation and discharge later today   PATIENT DISPOSITION:  PACU - hemodynamically stable.   Delay start of Pharmacological VTE agent (>24hrs) due to surgical blood loss or risk of bleeding: yes  Complications: none immediate.   Erin Obando L. Harraway-Smith, M.D., [facility]

## 2021-11-11 NOTE — Anesthesia Postprocedure Evaluation (Signed)
Anesthesia Post Note  Patient: [name]  Procedure(s) Performed: XI ROBOTIC ASSISTED TOTAL HYSTERECTOMY WITH LEFT SALPINGECTOMY (Abdomen) CYSTOSCOPY (Bladder)     Patient location during evaluation: Nursing Unit Anesthesia Type: General Level of consciousness: awake and alert, patient cooperative and oriented Pain management: pain level controlled Vital Signs Assessment: post-procedure vital signs reviewed and stable Respiratory status: nonlabored ventilation, spontaneous breathing and respiratory function stable Cardiovascular status: blood pressure returned to baseline and stable Postop Assessment: no apparent nausea or vomiting and able to ambulate Anesthetic complications: no   No notable events documented.  Last Vitals:  Vitals:   11/11/21 1405 11/11/21 1436  BP:  (!) 114/47  Pulse: 63 72  Resp: 14 12  Temp:  36.6 C  SpO2: 95% 98%    Last Pain:  Vitals:   11/11/21 0813  TempSrc: Oral  PainSc: 0-No pain                 Ginnie Marich,E. Genowefa Morga

## 2021-11-11 NOTE — Anesthesia Procedure Notes (Signed)
Procedure Name: Intubation Date/Time: 11/11/2021 10:31 AM  Performed by: Clearnce Sorrel, CRNAPre-anesthesia Checklist: Patient identified, Emergency Drugs available, Suction available and Patient being monitored Patient Re-evaluated:Patient Re-evaluated prior to induction Oxygen Delivery Method: Circle System Utilized Preoxygenation: Pre-oxygenation with 100% oxygen Induction Type: IV induction Ventilation: Mask ventilation without difficulty Laryngoscope Size: Mac and 3 Grade View: Grade I Tube type: Oral Tube size: 7.0 mm Number of attempts: 1 Airway Equipment and Method: Stylet and Oral airway Placement Confirmation: ETT inserted through vocal cords under direct vision, positive ETCO2 and breath sounds checked- equal and bilateral Secured at: 22 cm Tube secured with: Tape Dental Injury: Teeth and Oropharynx as per pre-operative assessment

## 2021-11-12 ENCOUNTER — Encounter (HOSPITAL_BASED_OUTPATIENT_CLINIC_OR_DEPARTMENT_OTHER): Payer: Self-pay | Admitting: Obstetrics & Gynecology

## 2021-11-12 ENCOUNTER — Encounter: Payer: Self-pay | Admitting: Obstetrics & Gynecology

## 2021-11-12 DIAGNOSIS — N8000 Endometriosis of the uterus, unspecified: Secondary | ICD-10-CM

## 2021-11-12 HISTORY — DX: Endometriosis of the uterus, unspecified: N80.00

## 2021-11-12 LAB — SURGICAL PATHOLOGY

## 2021-11-13 ENCOUNTER — Encounter: Payer: Self-pay | Admitting: Student

## 2021-11-13 DIAGNOSIS — L732 Hidradenitis suppurativa: Secondary | ICD-10-CM

## 2021-11-18 ENCOUNTER — Encounter: Payer: Self-pay | Admitting: Obstetrics & Gynecology

## 2021-11-19 ENCOUNTER — Other Ambulatory Visit: Payer: Self-pay | Admitting: Obstetrics & Gynecology

## 2021-11-19 DIAGNOSIS — G4709 Other insomnia: Secondary | ICD-10-CM

## 2021-11-19 DIAGNOSIS — B3731 Acute candidiasis of vulva and vagina: Secondary | ICD-10-CM

## 2021-11-19 MED ORDER — ZOLPIDEM TARTRATE 5 MG PO TABS: 5.0000 mg | ORAL_TABLET | Freq: Every evening | ORAL | 0 refills | Status: DC | PRN

## 2021-11-19 MED ORDER — FLUCONAZOLE 150 MG PO TABS: 150.0000 mg | ORAL_TABLET | Freq: Once | ORAL | 0 refills | Status: AC

## 2021-11-25 NOTE — Addendum Note (Signed)
Addended by: Jim Like B on: 11/25/2021 11:49 AM   Modules accepted: Orders

## 2021-11-26 ENCOUNTER — Encounter: Payer: Self-pay | Admitting: Obstetrics & Gynecology

## 2021-11-26 ENCOUNTER — Ambulatory Visit (INDEPENDENT_AMBULATORY_CARE_PROVIDER_SITE_OTHER): Payer: Commercial Managed Care - HMO | Admitting: Obstetrics and Gynecology

## 2021-11-26 ENCOUNTER — Encounter: Payer: Self-pay | Admitting: Obstetrics and Gynecology

## 2021-11-26 VITALS — BP 100/76 | HR 84 | Wt 174.0 lb

## 2021-11-26 DIAGNOSIS — Z9889 Other specified postprocedural states: Secondary | ICD-10-CM

## 2021-11-26 DIAGNOSIS — R300 Dysuria: Secondary | ICD-10-CM

## 2021-11-26 LAB — POCT URINALYSIS DIPSTICK (MANUAL)
Nitrite, UA: NEGATIVE
Poct Bilirubin: NEGATIVE
Poct Blood: 50 — AB
Poct Glucose: NORMAL mg/dL
Poct Urobilinogen: NORMAL mg/dL
Spec Grav, UA: 1.02
pH, UA: 6

## 2021-11-26 MED ORDER — IBUPROFEN 800 MG PO TABS: 800.0000 mg | ORAL_TABLET | Freq: Three times a day (TID) | ORAL | 0 refills | Status: DC | PRN

## 2021-11-26 MED ORDER — ONDANSETRON 4 MG PO TBDP: 4.0000 mg | ORAL_TABLET | Freq: Four times a day (QID) | ORAL | 0 refills | Status: DC | PRN

## 2021-11-26 NOTE — Progress Notes (Signed)
   History:  41 y.o.  here today for 2 week post op check. Pt is s/p RATH with bilateral salpingectomy on 11/11/2021.  Pt reports that she is doing well. She is eating.  She notes she has been having constipation, taking milk of magnesia and tried MiraLAX yesterday.  She also notes having intermittent nausea and vomiting.  She denies fever, chills, known sick contacts.  She reports increasing dysuria over the last week.  She denies vaginal bleeding and abnormal vaginal discharge.  She notes her blood pressure was lower today than usual, wondering if there is anything else she should be concerned about.  She denies any syncopal events.    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     The following portions of the patient's history were reviewed and updated as appropriate: allergies, current medications, past family history, past medical history, past social history, past surgical history and problem list.  Review of Systems:  Pertinent items are noted in HPI.    Objective:  Physical Exam Today's Vitals   11/26/21 1442 11/26/21 1536  BP: 92/62 100/76  Pulse: 82 84  Weight: 174 lb (78.9 kg)    Body mass index is 31.83 kg/m.   CONSTITUTIONAL: Well-developed, well-nourished female in no acute distress.  HENT:  Normocephalic, atraumatic EYES: Conjunctivae and EOM are normal. No scleral icterus.  NECK: Normal range of motion SKIN: Skin is warm and dry. No rash noted. Not diaphoretic.No pallor. [place]: Alert and oriented to person, place, and time. Normal coordination.  Abd: Soft, nontender and nondistended; her port sites are healing well. .  Pelvic: deferred  Labs and Imaging  SURGICAL PATHOLOGY  CASE: [id number]  PATIENT: Makaelah Bonifas  Surgical Pathology Report   Clinical History: AUB (crm)   FINAL MICROSCOPIC DIAGNOSIS:   A. UTERUS,  CERVIX, LEFT FALLOPIAN TUBE, HYSTERECTOMY AND SALPINGECTOMY:  -  Unremarkable cervix, negative for dysplasia.  -  Weakly proliferative/disordered proliferative endometrium, negative  for hyperplasia/atypia.  -  Myometrium with benign leiomyomata with hyalinization and  calcification.  -  Serosal surface with adhesions.  -  Left fallopian tube with scarring and endometriosis.  Assessment & Plan:  2 week post op check following [place] with bilateral salpingectomy.   Doing well  Reviewed her surg path.   Reviewed post op instructions and activities  Gradual increase in activities  F/u in 4 weeks or sooner prn  Reviewed no intercourse for 8 weeks post op  All questions answered.   Follow up UA  Briefly reviewed dx of endometriosis   Darliss Cheney, MD Minimally Invasive Gynecologic Surgery

## 2021-11-27 ENCOUNTER — Encounter: Payer: Self-pay | Admitting: Obstetrics and Gynecology

## 2021-11-27 ENCOUNTER — Other Ambulatory Visit: Payer: Self-pay

## 2021-11-27 MED ORDER — FLUCONAZOLE 150 MG PO TABS: 150.0000 mg | ORAL_TABLET | Freq: Once | ORAL | 0 refills | Status: AC

## 2021-11-27 MED ORDER — CEPHALEXIN 500 MG PO CAPS: 500.0000 mg | ORAL_CAPSULE | Freq: Three times a day (TID) | ORAL | 0 refills | Status: DC

## 2021-11-27 NOTE — Progress Notes (Signed)
Called patient and made her aware that Dr. Ihor Dow recommend we go ahead and treat her for a urinary tract infection due to her pain in mid lower abdomin and frequency of urination. Patient made aware that Dr. Ihor Dow recommend Keflex three times a day for seven days.  Patient states that she just finished a round of Keflex for the boils. Patient made aware that I informed Dr Ihor Dow of this and she recommended a round of Keflex for UTI symptoms. (Patient had done round of Keflex for boils starting on 11/11/21)  Patient also made aware that we sent in diflucan that she can take and the end of her antibiotic round if she has yeast infection symptoms.  Told patient that urine culture takes 48 hours to result and it will be reviewed to ensure UTI is susceptible to antibiotic given. Kathrene Alu RN

## 2021-11-28 LAB — URINE CULTURE

## 2021-12-01 ENCOUNTER — Telehealth: Payer: Self-pay

## 2021-12-01 ENCOUNTER — Ambulatory Visit (INDEPENDENT_AMBULATORY_CARE_PROVIDER_SITE_OTHER): Payer: Commercial Managed Care - HMO | Admitting: Family Medicine

## 2021-12-01 VITALS — BP 118/70 | HR 87 | Ht 62.0 in | Wt 174.0 lb

## 2021-12-01 DIAGNOSIS — N898 Other specified noninflammatory disorders of vagina: Secondary | ICD-10-CM | POA: Diagnosis not present

## 2021-12-01 DIAGNOSIS — Z113 Encounter for screening for infections with a predominantly sexual mode of transmission: Secondary | ICD-10-CM

## 2021-12-01 LAB — POCT URINALYSIS DIP (MANUAL ENTRY)
Bilirubin, UA: NEGATIVE
Blood, UA: NEGATIVE
Glucose, UA: NEGATIVE mg/dL
Ketones, POC UA: NEGATIVE mg/dL
Leukocytes, UA: NEGATIVE
Nitrite, UA: NEGATIVE
Protein Ur, POC: NEGATIVE mg/dL
Spec Grav, UA: >=1.03 — AB (ref 1.010–1.025)
Urobilinogen, UA: 0.2 E.U./dL
pH, UA: 6 (ref 5.0–8.0)

## 2021-12-01 LAB — POCT WET PREP (WET MOUNT)
Clue Cells Wet Prep Whiff POC: NEGATIVE
Trichomonas Wet Prep HPF POC: ABSENT

## 2021-12-01 NOTE — Telephone Encounter (Signed)
Called patient and made her aware that she does not have UTI. Patient can stop the antibiotics she started on Thursday.Patient then questions if urine culture can test for yeast and told her know that a vaginal culture would have to be done for testing for yeast.  Kathrene Alu RN

## 2021-12-01 NOTE — Progress Notes (Unsigned)
    SUBJECTIVE:   CHIEF COMPLAINT / HPI:   Vaginal Discharge -white, clumpy -unsure when it started, just saw it the other day -h/o BV several times -slight odor, not much -slight vaginal itching  -saw OB on 10/18 for 2 week postop check s/p total hysterectomy with bilateral salpingectomy on 10/3 -complained of increased dysuria at that time -urine culture was negative -on Keflex for skin boils -was also sent diflucan which she took  PERTINENT  PMH / PSH: tobacco use, endometriosis, septate uterus, s/p recent hysterectomy  OBJECTIVE:   BP 118/70   Pulse 87   Ht '5\' 2"'$  (1.575 m)   Wt 174 lb (78.9 kg)   [name] 02/23/2021 (Approximate)   SpO2 98%   BMI 31.83 kg/m   ***  ASSESSMENT/PLAN:   No problem-specific Assessment & Plan notes found for this encounter.     Alcus Dad, MD [place]

## 2021-12-01 NOTE — Patient Instructions (Addendum)
It was great to see you!  Your testing was negative for BV.  The discharge could be related to your recent hysterectomy.   I would give your body more time to heal. If your symptoms persist or worsen, I recommend you discuss further with your OBGYN.  Take care, Dr Rock Nephew

## 2021-12-03 ENCOUNTER — Other Ambulatory Visit: Payer: Self-pay | Admitting: Obstetrics & Gynecology

## 2021-12-03 DIAGNOSIS — B3731 Acute candidiasis of vulva and vagina: Secondary | ICD-10-CM

## 2021-12-03 MED ORDER — TERCONAZOLE 0.4 % VA CREA: 1.0000 | TOPICAL_CREAM | Freq: Every day | VAGINAL | 0 refills | Status: DC

## 2021-12-03 MED ORDER — FLUCONAZOLE 150 MG PO TABS: 150.0000 mg | ORAL_TABLET | ORAL | 1 refills | Status: DC

## 2021-12-03 NOTE — Assessment & Plan Note (Signed)
Wet prep negative for yeast, BV. Had recent negative urine culture. Also had normal STI testing in July and has not been sexually active since then. Suspect discharge may be physiologic in the setting of recent hysterectomy. Reassurance provided. Follow up if symptoms persist or worsen.

## 2021-12-04 ENCOUNTER — Telehealth: Payer: Self-pay

## 2021-12-04 NOTE — Telephone Encounter (Signed)
Patient calls nurse line requesting test results.   Advised patient on negative wet prep and urine culture.   Will forward to ordering provider for advisement.

## 2021-12-04 NOTE — Telephone Encounter (Signed)
We discussed her negative wet prep and urine studies during her visit. No additional testing was performed. Not sure what she is referring to.  If patient has more specific questions, let me know and I'd be happy to answer.  Alcus Dad, MD PGY-3, [place]

## 2021-12-04 NOTE — Telephone Encounter (Signed)
-----   Message from Lavonia Drafts, MD sent at 12/03/2021  8:23 PM EDT ----- Please call pt and make sure that she understands the info below. Specifically HOW to take the Diflucan!   Thanks,   Clh-S  Greetings,   I reviewed your labs. This is all yeast from where you go antibiotics following surgery for yur boils and then for the UTI.   Are you still taking the Diflucan?  You can take 3 tablets. ONE every 3 days!!!  I will also write forte cream which will make you feel better when you void.   For the boils, I recommend that you follow up with your primary care provider. They may have you see a dermatologist.   I will have the staff call you on tomorrow as well.

## 2021-12-18 ENCOUNTER — Encounter: Payer: Self-pay | Admitting: Student

## 2021-12-18 ENCOUNTER — Other Ambulatory Visit: Payer: Self-pay | Admitting: Student

## 2021-12-18 MED ORDER — CLINDAMYCIN PHOSPHATE 1 % EX GEL: Freq: Two times a day (BID) | CUTANEOUS | 2 refills | Status: DC

## 2021-12-18 NOTE — Progress Notes (Signed)
Patient requesting refill of clindamycin gel for boils to bridge her until she can see dermatoligist. Patient with Hx of Hidradenitis

## 2021-12-24 ENCOUNTER — Encounter: Payer: Self-pay | Admitting: General Practice

## 2021-12-24 ENCOUNTER — Encounter: Payer: Self-pay | Admitting: Obstetrics & Gynecology

## 2021-12-24 ENCOUNTER — Ambulatory Visit (INDEPENDENT_AMBULATORY_CARE_PROVIDER_SITE_OTHER): Payer: Commercial Managed Care - HMO | Admitting: Obstetrics & Gynecology

## 2021-12-24 ENCOUNTER — Other Ambulatory Visit: Payer: Self-pay | Admitting: Obstetrics & Gynecology

## 2021-12-24 VITALS — BP 113/77 | HR 82 | Wt 180.0 lb

## 2021-12-24 DIAGNOSIS — Z9889 Other specified postprocedural states: Secondary | ICD-10-CM

## 2021-12-24 MED ORDER — IBUPROFEN 800 MG PO TABS: 800.0000 mg | ORAL_TABLET | Freq: Three times a day (TID) | ORAL | 0 refills | Status: DC | PRN

## 2021-12-24 NOTE — Progress Notes (Signed)
History:  41 y.o.[name] here today for 2 week post op check. Pt is s/p RATH with unilateral salpingectomy on 11/11/2021.  Pt reports that she is doing well. She is eating and passing stools without difficulty.     The following portions of the patient's history were reviewed and updated as appropriate: allergies, current medications, past family history, past medical history, past social history, past surgical history and problem list.  Review of Systems:  Pertinent items are noted in HPI.    Objective:  Physical Exam BP 113/77   Pulse 82   Wt 180 lb (81.6 kg)   [name] 02/23/2021 (Approximate)   BMI 32.92 kg/m   CONSTITUTIONAL: Well-developed, well-nourished female in no acute distress.  HENT:  Normocephalic, atraumatic EYES: Conjunctivae and EOM are normal. No scleral icterus.  NECK: Normal range of motion SKIN: Skin is warm and dry. No rash noted. Not diaphoretic.No pallor. [place]: Alert and oriented to person, place, and time. Normal coordination.  Abd: Soft, nontender and nondistended; her port sites are healing well. .  Pelvic: deferred  Labs and Imaging Surg path10/04/2021  FINAL MICROSCOPIC DIAGNOSIS:   A. UTERUS, CERVIX, LEFT FALLOPIAN TUBE, HYSTERECTOMY AND SALPINGECTOMY:  -  Unremarkable cervix, negative for dysplasia.  -  Weakly proliferative/disordered proliferative endometrium, negative  for hyperplasia/atypia.  -  Myometrium with benign leiomyomata with hyalinization and  calcification.  - Serosal surface with adhesions.  -  Left fallopian tube with scarring and endometriosis.  Assessment & Plan:  6 week post op check following RATH with unilateral salpingectomy on 11/11/2021  Doing well  Reviewed her surg path.   Reviewed post op instructions and activities  Gradual increase in activities  F/u in 3 months or sooner prn if she is not having problems at 3 months, she can f/u at 1 year.    Reviewed no intercourse for 8 weeks post op  All questions answered.    [name] [name], M.D., [name]

## 2022-01-05 ENCOUNTER — Encounter: Payer: Self-pay | Admitting: Obstetrics & Gynecology

## 2022-01-07 ENCOUNTER — Encounter: Payer: Self-pay | Admitting: Obstetrics & Gynecology

## 2022-01-15 ENCOUNTER — Ambulatory Visit: Payer: Commercial Managed Care - HMO | Admitting: Student

## 2022-01-29 ENCOUNTER — Ambulatory Visit: Payer: Commercial Managed Care - HMO | Admitting: Family Medicine

## 2022-01-29 NOTE — Progress Notes (Deleted)
    SUBJECTIVE:   CHIEF COMPLAINT / HPI:   [name] is a 41 y.o. female who presents to the [facility] clinic today to discuss the following concerns:   ***  PERTINENT  PMH / PSH: ***  OBJECTIVE:   [name] 02/23/2021 (Approximate)    General: NAD, pleasant, able to participate in exam Cardiac: RRR, no murmurs. Respiratory: CTAB, normal effort, No wheezes, rales or rhonchi Abdomen: Bowel sounds present, nontender, nondistended, no hepatosplenomegaly. Extremities: no edema or cyanosis. Skin: warm and dry, no rashes noted Neuro: alert, no obvious focal deficits Psych: Normal affect and mood  ASSESSMENT/PLAN:   No problem-specific Assessment & Plan notes found for this encounter.     Sharion Settler, [place]

## 2022-02-06 ENCOUNTER — Encounter: Payer: Self-pay | Admitting: Student

## 2022-02-06 ENCOUNTER — Other Ambulatory Visit (HOSPITAL_COMMUNITY)
Admission: RE | Admit: 2022-02-06 | Discharge: 2022-02-06 | Disposition: A | Discharge status: Home / Self Care | Payer: Commercial Managed Care - HMO | Source: Ambulatory Visit | Attending: Family Medicine | Admitting: Family Medicine

## 2022-02-06 ENCOUNTER — Ambulatory Visit (INDEPENDENT_AMBULATORY_CARE_PROVIDER_SITE_OTHER): Payer: Commercial Managed Care - HMO | Admitting: Student

## 2022-02-06 ENCOUNTER — Other Ambulatory Visit: Payer: Self-pay

## 2022-02-06 ENCOUNTER — Other Ambulatory Visit: Payer: Self-pay | Admitting: Student

## 2022-02-06 VITALS — BP 123/68 | HR 90 | Wt 182.4 lb

## 2022-02-06 DIAGNOSIS — N760 Acute vaginitis: Secondary | ICD-10-CM | POA: Diagnosis not present

## 2022-02-06 DIAGNOSIS — R1013 Epigastric pain: Secondary | ICD-10-CM | POA: Diagnosis not present

## 2022-02-06 DIAGNOSIS — N898 Other specified noninflammatory disorders of vagina: Secondary | ICD-10-CM | POA: Insufficient documentation

## 2022-02-06 DIAGNOSIS — B9689 Other specified bacterial agents as the cause of diseases classified elsewhere: Secondary | ICD-10-CM | POA: Insufficient documentation

## 2022-02-06 LAB — POCT WET PREP (WET MOUNT)
Clue Cells Wet Prep Whiff POC: POSITIVE
Trichomonas Wet Prep HPF POC: ABSENT

## 2022-02-06 MED ORDER — METRONIDAZOLE 500 MG PO TABS: 500.0000 mg | ORAL_TABLET | Freq: Two times a day (BID) | ORAL | 0 refills | Status: AC

## 2022-02-06 MED ORDER — FLUCONAZOLE 150 MG PO TABS: 150.0000 mg | ORAL_TABLET | Freq: Once | ORAL | 0 refills | Status: AC

## 2022-02-06 MED ORDER — PANTOPRAZOLE SODIUM 20 MG PO TBEC: 20.0000 mg | DELAYED_RELEASE_TABLET | Freq: Every day | ORAL | 0 refills | Status: DC

## 2022-02-06 NOTE — Assessment & Plan Note (Addendum)
Wet prep is positive for BV which is likely cause of increased discharge.  Will still follow-up on gonorrhea and chlamydia swab. -Metronidazole 500 mg twice daily for 7 days -Diflucan 150 mg once and then repeat in 3 days to prevent yeast infection

## 2022-02-06 NOTE — Progress Notes (Signed)
    SUBJECTIVE:   CHIEF COMPLAINT / HPI:   Vaginal discharge Pt had hysterectomy in Oct of 2023. Started having a white discharge 5 days ago. Slight odor. No suprapubic pain but does have have central/epigastric abdominal pain. She would like to be tested for gonorrhea and chlamydia but not HIV or syphillis. She is not with a new a partner, has 1 partner, sometimes uses condoms.   Abdominal pain Epigastric pain for past year, worse after eating and vomited once last week after eating. Vomit was brown. Also has epigastric pain when she hasn't eaten. Sometimes she can't eat much d/t the pain and feeling like it will make her vomit. Was given a medication for acid reflux for similar symptoms a year ago. She cannot remember if the medication helped. No fevers.   PERTINENT  PMH / PSH: GERD  OBJECTIVE:   BP 123/68   Pulse 90   Wt 182 lb 6.4 oz (82.7 kg)   [name] 02/23/2021 (Approximate)   SpO2 100%   BMI 33.36 kg/m    General: NAD, pleasant, able to participate in exam Cardiac: Well-perfused Respiratory: Breathing comfortably on room air Abdomen: Bowel sounds present, mild generalized tenderness on palpation worse in epigastric area with no guarding, nondistended, soft GU: Chaperoned by CMA, normal external female genitalia with no lesions, moist pink vaginal mucosa, vaginal cuff visualized and intact, excessive amounts of thick white discharge Skin: warm and dry Neuro: alert, no obvious focal deficits Psych: Normal affect and mood  ASSESSMENT/PLAN:   Bacterial vaginosis Wet prep is positive for BV which is likely cause of increased discharge.  Will still follow-up on gonorrhea and chlamydia swab. -Metronidazole 500 mg twice daily for 7 days -Diflucan 150 mg once and then repeat in 3 days to prevent yeast infection  Abdominal pain Unsure of cause of epigastric pain. Considered GERD, stomach ulcer or cholelithiasis.  Abdominal exam is reassuring. She does not have an acute abdomen.   She is currently afebrile and it has been over a week since she last vomited. Will trial Protonix 20 mg daily.  If this does not help, we will proceed with RUQ [place] to check for gallbladder pathology.      Dr. Precious Gilding, [place]

## 2022-02-06 NOTE — Assessment & Plan Note (Signed)
Unsure of cause of epigastric pain. Considered GERD, stomach ulcer or cholelithiasis.  Abdominal exam is reassuring. She does not have an acute abdomen.  She is currently afebrile and it has been over a week since she last vomited. Will trial Protonix 20 mg daily.  If this does not help, we will proceed with RUQ [place] to check for gallbladder pathology.

## 2022-02-06 NOTE — Patient Instructions (Signed)
It was great to see you! Thank you for allowing me to participate in your care!   Our plans for today:  - I sent in a prescription for Protonix for your abdominal pain. Take this medication daily. - Return in 1-2 months for a follow up visit or sooner if needed   We are checking some labs today, I will call you if they are abnormal will send you a MyChart message or a letter if they are normal.  If you do not hear about your labs in the next 2 weeks please let us know.  Take care and seek immediate care sooner if you develop any concerns.   Dr. Precious Gilding, DO [facility]

## 2022-02-10 LAB — CERVICOVAGINAL ANCILLARY ONLY
Chlamydia: NEGATIVE
Comment: NEGATIVE
Comment: NORMAL
Neisseria Gonorrhea: NEGATIVE

## 2022-03-02 ENCOUNTER — Telehealth: Payer: Self-pay

## 2022-03-02 ENCOUNTER — Other Ambulatory Visit: Payer: Self-pay | Admitting: Student

## 2022-03-02 ENCOUNTER — Encounter: Payer: Self-pay | Admitting: Student

## 2022-03-02 DIAGNOSIS — R1013 Epigastric pain: Secondary | ICD-10-CM

## 2022-03-02 NOTE — Telephone Encounter (Signed)
Called patient with appointment.  [facility] 03/16/2022 [address] 0800 with an arrival of 0740  Patient will need to carry Photo ID and insurance card. Cancel or reschedule within 24 hours of there will be a $75 fee.  Ozella Almond, [place]

## 2022-03-02 NOTE — Progress Notes (Signed)
Pt's epigastric abdominal paint is still present after trial of Protonix. Will proceed with RUQ [place] to evaluate for biliary pathology.

## 2022-03-04 ENCOUNTER — Encounter: Payer: Self-pay | Admitting: Student

## 2022-03-04 ENCOUNTER — Ambulatory Visit
Admission: RE | Admit: 2022-03-04 | Discharge: 2022-03-04 | Disposition: A | Discharge status: Home / Self Care | Payer: Commercial Managed Care - HMO | Source: Ambulatory Visit | Attending: Family Medicine | Admitting: Family Medicine

## 2022-03-04 ENCOUNTER — Encounter: Payer: Self-pay | Admitting: Obstetrics & Gynecology

## 2022-03-04 DIAGNOSIS — R1013 Epigastric pain: Secondary | ICD-10-CM

## 2022-03-05 ENCOUNTER — Telehealth: Payer: Self-pay | Admitting: Student

## 2022-03-05 ENCOUNTER — Other Ambulatory Visit: Payer: Self-pay | Admitting: Student

## 2022-03-05 DIAGNOSIS — N760 Acute vaginitis: Secondary | ICD-10-CM

## 2022-03-05 MED ORDER — METRONIDAZOLE 0.75 % VA GEL: 1.0000 | Freq: Every day | VAGINAL | 0 refills | Status: DC

## 2022-03-05 NOTE — Progress Notes (Signed)
Pt recently diagnosed with BV. Was unable to tolerate oral metronidazole. Sent in prescription for metrogel.

## 2022-03-05 NOTE — Telephone Encounter (Signed)
Spoke with pt on the phone regarding 5 mm gallbladder polyp on RUQ [place]. Will repeat US in 6 months to monitor.

## 2022-03-16 ENCOUNTER — Other Ambulatory Visit: Payer: Commercial Managed Care - HMO

## 2022-03-25 ENCOUNTER — Encounter: Payer: Self-pay | Admitting: Obstetrics & Gynecology

## 2022-03-25 ENCOUNTER — Ambulatory Visit (INDEPENDENT_AMBULATORY_CARE_PROVIDER_SITE_OTHER): Payer: Medicaid Other | Admitting: Obstetrics & Gynecology

## 2022-03-25 VITALS — BP 109/48 | HR 66 | Ht 62.0 in | Wt 178.0 lb

## 2022-03-25 DIAGNOSIS — R102 Pelvic and perineal pain: Secondary | ICD-10-CM

## 2022-03-25 NOTE — Progress Notes (Signed)
3 month follow up. Kathrene Alu RN

## 2022-03-25 NOTE — Progress Notes (Signed)
History:  42 y.o. G0P0 here today for eval of pelvic pain. She is post op from a ROBOTIC ASSISTED TOTAL HYSTERECTOMY WITH LEFT SALPINGECTOMY CYSTOSCOPY on Nov 11, 2021. She reports no pain or bleeding or other adverse sx. She has resumed sexual intercourse. She had one episode of pain post intercourse but, says that resolved and she has no further sx.  Was eval for indigestion and was told that she 'had a polyp on her gallbladder.    The following portions of the patient's history were reviewed and updated as appropriate: allergies, current medications, past family history, past medical history, past social history, past surgical history and problem list.  Review of Systems:  Pertinent items are noted in HPI.    Objective:  Physical Exam Blood pressure (!) 109/48, pulse 66, height 5' 2"$  (1.575 m), weight 178 lb (80.7 kg), last menstrual period 02/23/2021.  CONSTITUTIONAL: Well-developed, well-nourished female in no acute distress.  HENT:  Normocephalic, atraumatic EYES: Conjunctivae and EOM are normal. No scleral icterus.  NECK: Normal range of motion SKIN: Skin is warm and dry. No rash noted. Not diaphoretic.No pallor. [place]: Alert and oriented to person, place, and time. Normal coordination.  Pelvic: exam not indicated  Labs and Imaging US Abdomen Limited RUQ (LIVER/GB)  Result Date: 03/04/2022 CLINICAL DATA:  Intermittent abdominal pain EXAM: ULTRASOUND ABDOMEN LIMITED RIGHT UPPER QUADRANT COMPARISON:  CT abdomen pelvis 03/05/2010 FINDINGS: Gallbladder: Probable 5 mm polyp within the gallbladder neck. No gallbladder wall thickening or pericholecystic fluid. Negative sonographic Murphy's sign. Common bile duct: Diameter: 2 mm Liver: No focal lesion identified. Within normal limits in parenchymal echogenicity. Portal vein is patent on color Doppler imaging with normal direction of blood flow towards the liver. Other: None. IMPRESSION: 1. Probable 5 mm polyp within the gallbladder neck.  Given the location, consider follow-up ultrasound in 6 months to reassess. 2. No cholelithiasis or sonographic evidence for acute cholecystitis. Electronically Signed   By: Lovey Newcomer M.D.   On: 03/04/2022 11:16    Assessment & Plan:  Pelvic pain- now post [place] with bilateral salpingectomy.  Pt is doing well. No immediate concerns.   F/u in 1 year or sooner prn   Tyshea Imel L. Harraway-Smith, M.D., [place]

## 2022-03-27 ENCOUNTER — Ambulatory Visit: Payer: Commercial Managed Care - HMO | Admitting: Family Medicine

## 2022-03-27 ENCOUNTER — Telehealth: Payer: Self-pay | Admitting: Family Medicine

## 2022-03-27 ENCOUNTER — Ambulatory Visit (INDEPENDENT_AMBULATORY_CARE_PROVIDER_SITE_OTHER): Payer: Medicaid Other | Admitting: Family Medicine

## 2022-03-27 ENCOUNTER — Other Ambulatory Visit (HOSPITAL_COMMUNITY)
Admission: RE | Admit: 2022-03-27 | Discharge: 2022-03-27 | Disposition: A | Discharge status: Home / Self Care | Payer: Medicaid Other | Source: Ambulatory Visit | Attending: General Surgery | Admitting: General Surgery

## 2022-03-27 VITALS — BP 112/62 | HR 80 | Ht 62.0 in | Wt 184.2 lb

## 2022-03-27 DIAGNOSIS — Z113 Encounter for screening for infections with a predominantly sexual mode of transmission: Secondary | ICD-10-CM

## 2022-03-27 DIAGNOSIS — B379 Candidiasis, unspecified: Secondary | ICD-10-CM

## 2022-03-27 DIAGNOSIS — B9689 Other specified bacterial agents as the cause of diseases classified elsewhere: Secondary | ICD-10-CM

## 2022-03-27 LAB — POCT WET PREP (WET MOUNT)
Clue Cells Wet Prep Whiff POC: POSITIVE
Trichomonas Wet Prep HPF POC: ABSENT

## 2022-03-27 MED ORDER — METRONIDAZOLE 500 MG PO TABS: 500.0000 mg | ORAL_TABLET | Freq: Two times a day (BID) | ORAL | 0 refills | Status: AC

## 2022-03-27 MED ORDER — FLUCONAZOLE 150 MG PO TABS: 150.0000 mg | ORAL_TABLET | Freq: Once | ORAL | 1 refills | Status: AC

## 2022-03-27 NOTE — Telephone Encounter (Signed)
Called to discuss results of vaginal swab showing BV.  Patient prefers metronidazole tablets p.o.  Will send prescription to pharmacy.  All questions answered.  Ezequiel Essex, MD

## 2022-03-27 NOTE — Patient Instructions (Signed)
It was wonderful to see you today. Thank you for allowing me to be a part of your care. Below is a short summary of what we discussed at your visit today:  STI screening - Today we obtained a vaginal swab to screen for gonorrhea, chlamydia, and trichomonas - If the results are normal, I will send you a letter or MyChart message. If the results are abnormal, I will give you a call.       If you have any questions or concerns, please do not hesitate to contact us via phone or MyChart message.   Ezequiel Essex, MD

## 2022-03-27 NOTE — Progress Notes (Signed)
   SUBJECTIVE:   CHIEF COMPLAINT / HPI:   Here for an STI check.  -Concern for specific exposure? no -Current symptoms: some abnormal discharge -Denies vaginal odor, pain, lesions, rashes -Contraception: hysterectomy -Consistent barrier method use: no -[name]: 02/23/22 -PAP: 04/30/20 NILM, HPV(-), next due 2027 -Indication for HIV PrEP: no   PERTINENT  PMH / PSH:  Patient Active Problem List   Diagnosis Date Noted   Yeast infection 04/06/2022   Sore throat 04/02/2022   Bacterial vaginosis 02/06/2022   Adhesions of adnexa of uterus due to endometriosis 11/12/2021   Post-operative state 11/11/2021   Pelvic pain in female 11/11/2021   Pain of joint of left ankle and foot 10/06/2021   Screen for STD (sexually transmitted disease) 09/01/2021   Fatigue 09/01/2021   Abnormal uterine bleeding (AUB) 05/08/2021   Exposure to sexually transmitted disease (STD) 03/06/2021   Molluscum contagiosum 12/19/2020   Abdominal pain 08/16/2020   Constipation 08/16/2020   Frequent urination 06/24/2020   Hydradenitis 06/24/2020   Ganglion cyst of left foot 06/13/2020   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   Menorrhagia 12/25/2019   Vaginal discharge 07/08/2019   GERD (gastroesophageal reflux disease) 06/07/2019   HSV-2 seropositive 03/07/2019   Hydrosalpinx 12/30/2018   Endometriosis 05/01/2011   SEPTATE UTERUS 07/24/2009   TOBACCO ABUSE 09/26/2008   Female infertility 09/26/2008   Viral labyrinthitis 06/20/2007   Menometrorrhagia 09/21/2006    OBJECTIVE:   BP 112/62   Pulse 80   Ht 5' 2"$  (1.575 m)   Wt 184 lb 3.2 oz (83.6 kg)   [name] 02/23/2021 (Approximate)   SpO2 98%   BMI 33.69 kg/m    Physical Exam General: Awake, alert, oriented, no acute distress Respiratory: Normal work of breathing, no respiratory distress Neuro: Cranial nerves II through X grossly intact, able to move all extremities spontaneously Vulva: Normal appearing vulva without rashes, lesions, or deformities Vagina: Pale pink  rugated vaginal tissue without obvious lesions, physiologic discharge of whitish color, cervix without lesion or overt tenderness with swab  Sensitive exam performed with chaperone in the room:  Erskine Emery, CMA  ASSESSMENT/PLAN:   Screen for STD (sexually transmitted disease) No concern for specific STI. Will screen for GC, CH, and trich with swab. Politely declines blood work for HIV and RPR today.   Yeast infection Seen on physical exam. Will rx diflucan. Patient prefers to leave clinic and await other wet prep results via MyChart or phone call. Will call with any other positives.      Ezequiel Essex, MD [place]

## 2022-03-30 LAB — CERVICOVAGINAL ANCILLARY ONLY
Chlamydia: NEGATIVE
Comment: NEGATIVE
Comment: NORMAL
Neisseria Gonorrhea: NEGATIVE

## 2022-04-01 ENCOUNTER — Ambulatory Visit (HOSPITAL_COMMUNITY)
Admission: EM | Admit: 2022-04-01 | Discharge: 2022-04-01 | Disposition: A | Discharge status: Home / Self Care | Payer: Medicaid Other | Attending: Internal Medicine | Admitting: Internal Medicine

## 2022-04-01 ENCOUNTER — Encounter (HOSPITAL_COMMUNITY): Payer: Self-pay

## 2022-04-01 DIAGNOSIS — J029 Acute pharyngitis, unspecified: Secondary | ICD-10-CM | POA: Insufficient documentation

## 2022-04-01 LAB — POCT INFECTIOUS MONO SCREEN, ED / UC: Mono Screen: NEGATIVE

## 2022-04-01 LAB — POCT RAPID STREP A, ED / UC: Streptococcus, Group A Screen (Direct): NEGATIVE

## 2022-04-01 MED ORDER — AMOXICILLIN-POT CLAVULANATE 875-125 MG PO TABS: 1.0000 | ORAL_TABLET | Freq: Two times a day (BID) | ORAL | 0 refills | Status: DC

## 2022-04-01 NOTE — ED Triage Notes (Signed)
Pt is here for sore throat pt would like a throat swab for STD [id number]

## 2022-04-01 NOTE — Discharge Instructions (Signed)
We will call you if results are positive

## 2022-04-01 NOTE — ED Provider Notes (Signed)
[place] [facility]   QN:[id number] 04/01/22 Arrival Time: 0801  ASSESSMENT & PLAN:  1. Sore throat    -Patient presents with sore throat x 1 week.  Point-of-care strep testing negative, culture pending.  Point-of-care mono was negative.  STD throat swab pending.  Will call her if the results are positive.  In the meantime, I will treat her empirically for sinus infection with Augmentin.  Meds ordered this encounter  Medications   amoxicillin-clavulanate (AUGMENTIN) 875-125 MG tablet    Sig: Take 1 tablet by mouth every 12 (twelve) hours.    Dispense:  14 tablet    Refill:  0     Discharge Instructions      We will call you if results are positive        Reviewed expectations re: course of current medical issues. Questions answered. Outlined signs and symptoms indicating need for more acute intervention. Patient verbalized understanding. After Visit Summary given.   SUBJECTIVE: 42 year old female comes to urgent care to be evaluated for sore throat.  She has had a sore throat and general upper respiratory symptoms for about 1 week now.  She reports cough and ear pain as well.  She does not think she has had any fevers.  She has been trying over-the-counter medications without relief.  She also reports intermittent sweats and a little bit of general fatigue.  She does work at an [employer] but wears a mask.  She comes here for further evaluation.  She requests STD testing of her throat today.  She is sexually active with males.  She had a negative STD panel last week at the family medicine center but was positive for BV that was treated with Flagyl.  Patient's last menstrual period was 02/23/2021 (approximate). Past Surgical History:  Procedure Laterality Date   BREAST BIOPSY Right 1999   per pt benign    CYSTOSCOPY N/A 11/11/2021   Procedure: CYSTOSCOPY;  Surgeon: Lavonia Drafts, MD;  Location: [place];  Service: Gynecology;   Laterality: N/A;   DILITATION & CURRETTAGE/HYSTROSCOPY WITH NOVASURE ABLATION N/A 05/08/2021   Procedure: HYSTEROSCOPY, DILATATION & CURETTAGE, ENDOMETRIAL ABLATION WITH NOVASURE;  Surgeon: Lavonia Drafts, MD;  Location: [place];  Service: Gynecology;  Laterality: N/A;   INCISION AND DRAINAGE     right axilla, 2019, 2020, 2022, 2023   LAPAROSCOPIC SALPINGOOPHERECTOMY Right 03-25-2010   @[facility]    and LYSIS ADHESIONS   LAPAROSCOPY N/A 05/30/2019   Procedure: DIAGNOSTIC LAPAROSCOPY;  Surgeon: Woodroe Mode, MD;  Location: [facility];  Service: Gynecology;  Laterality: N/A;   ROBOTIC ASSISTED TOTAL HYSTERECTOMY N/A 11/11/2021   Procedure: XI ROBOTIC ASSISTED TOTAL HYSTERECTOMY WITH LEFT SALPINGECTOMY;  Surgeon: Lavonia Drafts, MD;  Location: [place];  Service: Gynecology;  Laterality: N/A;     OBJECTIVE:  Vitals:   04/01/22 0823  BP: 129/83  Pulse: 74  Resp: 12  Temp: 98.4 F (36.9 C)  TempSrc: Oral  SpO2: 98%     Physical Exam Vitals and nursing note reviewed.  Constitutional:      General: She is not in acute distress. HENT:     Head: Normocephalic.     Right Ear: Tympanic membrane, ear canal and external ear normal.     Left Ear: Tympanic membrane, ear canal and external ear normal.     Nose: Congestion present.     Mouth/Throat:     Pharynx: Posterior oropharyngeal erythema present. No oropharyngeal exudate.  Cardiovascular:     Rate  and Rhythm: Normal rate.  Pulmonary:     Effort: Pulmonary effort is normal.  Musculoskeletal:        General: Normal range of motion.  Lymphadenopathy:     Cervical: No cervical adenopathy.  Neurological:     General: No focal deficit present.     Mental Status: She is alert.      Labs: Results for orders placed or performed during the hospital encounter of 04/01/22  POCT Rapid Strep A  Result Value Ref Range   Streptococcus, Group A Screen (Direct) NEGATIVE NEGATIVE  POCT  Infectious Mono Screen (UC)  Result Value Ref Range   Mono Screen NEGATIVE NEGATIVE   Labs Reviewed  CULTURE, GROUP A STREP [facility])  POCT RAPID STREP A, ED / UC  POCT INFECTIOUS MONO SCREEN, ED / UC  CYTOLOGY, (ORAL, ANAL, URETHRAL) ANCILLARY ONLY    Imaging: No results found.   Allergies  Allergen Reactions   Sulfa Antibiotics Other (See Comments)    Upset stomach - can tolerate                                                Past Medical History:  Diagnosis Date   Arthritis    left ankle   COVID-19 09/24/2020   flu like symptoms per pt   Endometriosis    w/abnormal uterine bleeding, Pt follows with Dr. Margaretmary Dys, [place] 08/27/21 as of 10/22/21.   Ganglion cyst of foot    left foot, hx of aspiration, Follows with Dr. Everardo Beals, Sports Medicine, [place] 10/06/21   GERD (gastroesophageal reflux disease)    occasional with spicy foods   History of 2019 novel coronavirus disease (COVID-19)    pt tested positive 03-03-2019, results in care everywhere.  (05-22-2019 per pt had symptoms of fatigue, headache, and loss of taste/ smell;  fatigue and headache after one week and loss taste/ smell resolved after 3 wks)   Infertility, female    Ovarian cyst    Pelvic mass in female    large fluid filled pelvic mass   Recurrent boils    Septate uterus    Trichomoniasis    hx of    Social History   Socioeconomic History   Marital status: Single    Spouse name: Not on file   Number of children: Not on file   Years of education: Not on file   Highest education level: Not on file  Occupational History   Not on file  Tobacco Use   Smoking status: Every Day    Packs/day: 0.50    Years: 20.00    Total pack years: 10.00    Types: Cigarettes   Smokeless tobacco: Never  Vaping Use   Vaping Use: Never used  Substance and Sexual Activity   Alcohol use: Yes    Comment: occasionally on weekends   Drug use: Never   Sexual activity: Yes    Birth control/protection: Pill   Other Topics Concern   Not on file  Social History Narrative   Not on file   Social Determinants of Health   Financial Resource Strain: Not on file  Food Insecurity: Food Insecurity Present (08/03/2019)   Hunger Vital Sign    Worried About Running Out of Food in the Last Year: Sometimes true    Ran Out of Food in the Last Year: Sometimes true  Transportation Needs: No Transportation Needs (08/03/2019)   PRAPARE - Hydrologist (Medical): No    Lack of Transportation (Non-Medical): No  Physical Activity: Not on file  Stress: Not on file  Social Connections: Not on file  Intimate Partner Violence: Not on file    Family History  Problem Relation Age of Onset   Hypertension Mother    Diabetes Mother    Cancer Father    Hypertension Father    Diabetes Brother    Hypertension Brother    Breast cancer Neg Hx       Chelcee Korpi, Dorian Pod, MD 04/01/22 [phone]

## 2022-04-02 ENCOUNTER — Ambulatory Visit (INDEPENDENT_AMBULATORY_CARE_PROVIDER_SITE_OTHER): Payer: Medicaid Other | Admitting: Family Medicine

## 2022-04-02 ENCOUNTER — Encounter: Payer: Self-pay | Admitting: Family Medicine

## 2022-04-02 ENCOUNTER — Ambulatory Visit: Payer: Medicaid Other | Admitting: Student

## 2022-04-02 VITALS — BP 112/70 | HR 78 | Temp 98.0°F | Ht 62.0 in | Wt 182.0 lb

## 2022-04-02 DIAGNOSIS — J029 Acute pharyngitis, unspecified: Secondary | ICD-10-CM | POA: Insufficient documentation

## 2022-04-02 LAB — CYTOLOGY, (ORAL, ANAL, URETHRAL) ANCILLARY ONLY
Chlamydia: NEGATIVE
Comment: NEGATIVE
Comment: NEGATIVE
Comment: NORMAL
Neisseria Gonorrhea: NEGATIVE
Trichomonas: NEGATIVE

## 2022-04-02 LAB — POC SOFIA 2 FLU + SARS ANTIGEN FIA
Influenza A, POC: NEGATIVE
Influenza B, POC: NEGATIVE
SARS Coronavirus 2 Ag: NEGATIVE

## 2022-04-02 NOTE — Patient Instructions (Signed)
Please take your augmentin for 7 days as prescribed. You can continue using tylenol and ibuprofen for pain control. You can try a medication like Sudafed to help with any congestion.

## 2022-04-02 NOTE — Progress Notes (Signed)
  SUBJECTIVE:   CHIEF COMPLAINT / HPI:   Earache and throat pain 5 to 6 days -Right-sided ear pain and throat pain/trouble swallowing -Also having some right-sided sinus pressure/headache.  No vision changes, vomiting, abdominal pain, dysuria -Denies any fevers -Has been using over-the-counter TheraFlu and Tylenol/ibuprofen without much relief  Visited urgent care yesterday and was prescribed Augmentin for presumed sinusitis.  At that time, she had a negative strep swab and mono swab.  STD throat swab still pending.  She has not started her Augmentin yet because she was not clear about whether she should be taking this or not   OBJECTIVE:  BP 112/70   Pulse 78   Temp 98 F (36.7 C)   Ht 5' 2"$  (1.575 m)   Wt 182 lb (82.6 kg)   [name] 02/23/2021 (Approximate)   SpO2 100%   BMI 33.29 kg/m   General: NAD, pleasant, able to participate in exam HEENT: Erythema noted in posterior oropharynx, no significant tonsillar exudate.  Bilateral TM visualized with no bulging/erythema.  Bilateral ear canals without significant erythema or exudate.  Right external ear slightly sore to palpation/manipulation, no surrounding erythema or abscess/fluid collection noted.  Left external ear normal.  No significant cervical lymphadenopathy. Cardiac: RRR, no murmurs auscultated Respiratory: CTAB, normal WOB Abdomen: soft, non-tender, non-distended Extremities: warm and well perfused, no edema or cyanosis Skin: warm and dry, no rashes noted Neuro: alert, no obvious focal deficits, speech normal Psych: Normal affect and mood  ASSESSMENT/PLAN:   Sore throat Assessment & Plan: Reassured by negative strep/mono swab in urgent care yesterday despite pharyngeal erythema noted on exam.  Also reassured by no fevers.  No evidence of ear infection.  COVID/flu negative.  Suspect viral etiology versus developing bacterial sinusitis, advised to continue treatment as prescribed with Augmentin for 7 days.  Discussed  return precautions.  Orders: -     POC SOFIA 2 FLU + SARS ANTIGEN FIA    Return if symptoms worsen or fail to improve.  August Albino, MD [facility]

## 2022-04-02 NOTE — Assessment & Plan Note (Addendum)
Reassured by negative strep/mono swab in urgent care yesterday despite pharyngeal erythema noted on exam.  Also reassured by no fevers.  No evidence of ear infection.  COVID/flu negative.  Suspect viral etiology versus developing bacterial sinusitis, advised to continue treatment as prescribed with Augmentin for 7 days.  Discussed return precautions.

## 2022-04-03 LAB — CULTURE, GROUP A STREP (THRC)

## 2022-04-06 DIAGNOSIS — B379 Candidiasis, unspecified: Secondary | ICD-10-CM | POA: Insufficient documentation

## 2022-04-06 NOTE — Assessment & Plan Note (Signed)
Seen on physical exam. Will rx diflucan. Patient prefers to leave clinic and await other wet prep results via MyChart or phone call. Will call with any other positives.

## 2022-04-06 NOTE — Assessment & Plan Note (Signed)
No concern for specific STI. Will screen for GC, CH, and trich with swab. Politely declines blood work for HIV and RPR today.

## 2022-04-22 ENCOUNTER — Other Ambulatory Visit (HOSPITAL_COMMUNITY)
Admission: RE | Admit: 2022-04-22 | Discharge: 2022-04-22 | Disposition: A | Discharge status: Home / Self Care | Payer: Medicaid Other | Source: Ambulatory Visit | Attending: Family Medicine | Admitting: Family Medicine

## 2022-04-22 ENCOUNTER — Ambulatory Visit (INDEPENDENT_AMBULATORY_CARE_PROVIDER_SITE_OTHER): Payer: Medicaid Other | Admitting: Family Medicine

## 2022-04-22 VITALS — BP 104/70 | HR 97 | Wt 176.0 lb

## 2022-04-22 DIAGNOSIS — Z113 Encounter for screening for infections with a predominantly sexual mode of transmission: Secondary | ICD-10-CM

## 2022-04-22 DIAGNOSIS — R350 Frequency of micturition: Secondary | ICD-10-CM

## 2022-04-22 DIAGNOSIS — R102 Pelvic and perineal pain: Secondary | ICD-10-CM

## 2022-04-22 DIAGNOSIS — M545 Low back pain, unspecified: Secondary | ICD-10-CM | POA: Diagnosis not present

## 2022-04-22 DIAGNOSIS — R399 Unspecified symptoms and signs involving the genitourinary system: Secondary | ICD-10-CM | POA: Diagnosis present

## 2022-04-22 LAB — POCT URINALYSIS DIP (CLINITEK)
Blood, UA: NEGATIVE
Glucose, UA: NEGATIVE mg/dL
Leukocytes, UA: NEGATIVE
Nitrite, UA: NEGATIVE
POC PROTEIN,UA: 30 — AB
Spec Grav, UA: >=1.03 — AB (ref 1.010–1.025)
Urobilinogen, UA: 0.2 E.U./dL
pH, UA: 5.5 (ref 5.0–8.0)

## 2022-04-22 MED ORDER — FLUCONAZOLE 200 MG PO TABS: ORAL_TABLET | ORAL | 0 refills | Status: DC

## 2022-04-22 MED ORDER — TERCONAZOLE 0.4 % VA CREA: 1.0000 | TOPICAL_CREAM | Freq: Every day | VAGINAL | 0 refills | Status: DC

## 2022-04-22 NOTE — Progress Notes (Signed)
    CHIEF COMPLAINT / HPI: Vaginal discharge with some pelvic discomfort.  Also having some low back pain that gets worse as the day gets longer.  She does work standing most of the day.  Had a hysterectomy and was off for 6 weeks and wonders if the hysterectomy has a thing to do with the back pain.  Would like to get screened for STIs while she is here. Used to of the yeast pill she had at home.  Has also been trying to use some of the leftover Flagyl tablets she had and neither seem to have been helping her discharge.  Has had some increased in frequency, no true burning on urination although when urine hits the external vaginal area she has some mild discomfort.  PERTINENT  PMH / PSH: I have reviewed the patient's medications, allergies, past medical and surgical history, smoking status and updated in the EMR as appropriate. Hysterectomy, bilateral oophorectomy.  History of endometriosis and fibroids.  OBJECTIVE:  [name] 02/23/2021 (Approximate)  GENERAL: Well-developed, no acute distress GU: Externally normal.  Thick clumpy white discharge in the vaginal vault.  Pelvic exam reveals absent uterus.  Bladder is nontender to palpation.  ASSESSMENT / PLAN:  #1.  Yeast vaginitis.  Will treat with repeat Diflucan x 3 doses at 200 mg per dose.  Also Terazol vaginal cream x 7 days. 2.  STI screening completed 3.  Urinalysis and urine culture. 4.  Check BMP for kidney function. No problem-specific Assessment & Plan notes found for this encounter.   Dorcas Mcmurray MD

## 2022-04-22 NOTE — Patient Instructions (Signed)
Going to retreat the yeast infection.  I am giving you a prescription for some vaginal cream to use once at night for 7 days.  Will also have you repeat the oral medication for 2 doses.  I have called both of those in.  I am sending the urine off for culture.  I am also getting some blood work and will send you a note about that.  If her symptoms do not totally resolve within the next 3 to 7 days, we need to see you back.  If they get worse at all, please let me know.  Great to see you!

## 2022-04-23 ENCOUNTER — Other Ambulatory Visit: Payer: Self-pay | Admitting: Family Medicine

## 2022-04-23 ENCOUNTER — Encounter: Payer: Self-pay | Admitting: Student

## 2022-04-23 DIAGNOSIS — Z1231 Encounter for screening mammogram for malignant neoplasm of breast: Secondary | ICD-10-CM

## 2022-04-23 DIAGNOSIS — Z139 Encounter for screening, unspecified: Secondary | ICD-10-CM

## 2022-04-24 ENCOUNTER — Encounter: Payer: Self-pay | Admitting: Family Medicine

## 2022-04-24 LAB — CERVICOVAGINAL ANCILLARY ONLY
Bacterial Vaginitis (gardnerella): NEGATIVE
Candida Glabrata: NEGATIVE
Candida Vaginitis: NEGATIVE
Chlamydia: NEGATIVE
Comment: NEGATIVE
Comment: NEGATIVE
Comment: NEGATIVE
Comment: NEGATIVE
Comment: NEGATIVE
Comment: NORMAL
Neisseria Gonorrhea: NEGATIVE
Trichomonas: NEGATIVE

## 2022-04-24 LAB — COMPREHENSIVE METABOLIC PANEL
ALT: 11 IU/L (ref 0–32)
AST: 17 IU/L (ref 0–40)
Albumin/Globulin Ratio: 1.6 (ref 1.2–2.2)
Albumin: 4.6 g/dL (ref 3.9–4.9)
Alkaline Phosphatase: 76 IU/L (ref 44–121)
BUN/Creatinine Ratio: 20 (ref 9–23)
BUN: 17 mg/dL (ref 6–24)
Bilirubin Total: 0.4 mg/dL (ref 0.0–1.2)
CO2: 22 mmol/L (ref 20–29)
Calcium: 9.7 mg/dL (ref 8.7–10.2)
Chloride: 104 mmol/L (ref 96–106)
Creatinine, Ser: 0.84 mg/dL (ref 0.57–1.00)
Globulin, Total: 2.8 g/dL (ref 1.5–4.5)
Glucose: 75 mg/dL (ref 70–99)
Potassium: 4.1 mmol/L (ref 3.5–5.2)
Sodium: 140 mmol/L (ref 134–144)
Total Protein: 7.4 g/dL (ref 6.0–8.5)
eGFR: 88 mL/min/{1.73_m2} (ref >59)

## 2022-04-24 LAB — URINE CULTURE

## 2022-04-24 LAB — RPR W/REFLEX TO TREPSURE: RPR: NONREACTIVE

## 2022-04-24 LAB — HIV ANTIBODY (ROUTINE TESTING W REFLEX): HIV Screen 4th Generation wRfx: NONREACTIVE

## 2022-04-24 LAB — T PALLIDUM ANTIBODY, EIA: T pallidum Antibody, EIA: NEGATIVE

## 2022-05-15 ENCOUNTER — Ambulatory Visit (INDEPENDENT_AMBULATORY_CARE_PROVIDER_SITE_OTHER): Payer: Commercial Managed Care - HMO | Admitting: Family Medicine

## 2022-05-15 ENCOUNTER — Encounter: Payer: Self-pay | Admitting: Family Medicine

## 2022-05-15 VITALS — BP 112/64 | HR 80 | Ht 62.0 in | Wt 180.0 lb

## 2022-05-15 DIAGNOSIS — R21 Rash and other nonspecific skin eruption: Secondary | ICD-10-CM | POA: Diagnosis not present

## 2022-05-15 MED ORDER — CETIRIZINE HCL 10 MG PO TABS: 10.0000 mg | ORAL_TABLET | Freq: Every day | ORAL | 11 refills | Status: DC | PRN

## 2022-05-15 MED ORDER — TRIAMCINOLONE ACETONIDE 0.025 % EX OINT: 1.0000 | TOPICAL_OINTMENT | Freq: Two times a day (BID) | CUTANEOUS | 0 refills | Status: AC

## 2022-05-15 NOTE — Patient Instructions (Signed)
It was nice seeing you today!  Use topical triamcinolone twice a day for up to 14 days until getting better.  Take cetirizine as needed for itching.  If not getting better in 1 week, please let us know.  Stay well, Littie Deeds, MD [facility] [phone]  --  Make sure to check out at the front desk before you leave today.  Please arrive at least 15 minutes prior to your scheduled appointments.  If you had blood work today, I will send you a MyChart message or a letter if results are normal. Otherwise, I will give you a call.  If you had a referral placed, they will call you to set up an appointment. Please give [place] a call if you don't hear back in the next 2 weeks.  If you need additional refills before your next appointment, please call your pharmacy first.

## 2022-05-15 NOTE — Progress Notes (Signed)
    SUBJECTIVE:   CHIEF COMPLAINT / HPI:  Chief Complaint  Patient presents with   Rash    Patient has had a pruritic rash under the left arm for about 1 week.   Has tried OTC topical hydrocortisone without relief. Patient with history of hidradenitis, reports she did have a boil last week which did drain spontaneously and is healing.  She reports the pruritic area is around where the boil was. Denies fever, chills, active drainage.   PERTINENT  PMH / PSH: Acrochordon, HSV-2, hidradenitis, molluscum  Patient Care Team: Erick Alley, DO as PCP - General (Family Medicine)   OBJECTIVE:   BP 112/64   Pulse 80   Ht 5\' 2"  (1.575 m)   Wt 180 lb (81.6 kg)   [name] 02/23/2021 (Approximate)   SpO2 100%   BMI 32.92 kg/m   Physical Exam Constitutional:      General: She is not in acute distress. Cardiovascular:     Rate and Rhythm: Normal rate.  Pulmonary:     Effort: Pulmonary effort is normal. No respiratory distress.  Musculoskeletal:     Cervical back: Neck supple.  Skin:    Comments: Left axillary region with significant scarring and approximately 2 cm nodule.  Surrounding area with minor areas of excoriation, no significant erythema.  Neurological:     Mental Status: She is alert.         05/15/2022    9:08 AM  Depression screen PHQ 2/9  Decreased Interest 0  Down, Depressed, Hopeless 0  PHQ - 2 Score 0     {Show previous vital signs (optional):23777}    ASSESSMENT/PLAN:   1. Rash and nonspecific skin eruption Scattered areas of excoriation surrounding left axillary region in the setting of recent reported boil/abscess that drained spontaneously.  Could be related to healing process or local irritation to the area.  Probably self-limited, will treat supportively with stronger topical corticosteroid and oral antihistamine. - triamcinolone (KENALOG) 0.025 % ointment; Apply 1 Application topically 2 (two) times daily for 14 days.  Dispense: 80 g; Refill: 0 -  cetirizine (ZYRTEC) 10 MG tablet; Take 1 tablet (10 mg total) by mouth daily as needed for allergies.  Dispense: 30 tablet; Refill: 11  - advised to return for reevaluation if not improving in 1 week  Return if symptoms worsen or fail to improve.   Littie Deeds, MD [facility]

## 2022-05-19 ENCOUNTER — Encounter: Payer: Self-pay | Admitting: Obstetrics & Gynecology

## 2022-05-25 NOTE — Progress Notes (Signed)
    SUBJECTIVE:   CHIEF COMPLAINT / HPI:   [name] is a 42 y.o. female who presents to the [facility] clinic today to discuss the following concerns:   Rash Follow Up Pruritic rash under left arm for just over 2 weeks. Has hx of hidradenitis. Last seen on 4./5 for similar symptoms. Was prescribed kenalog ointment, zyrtic to take. Adivsed to return in 1 week if not improving.   She has been using it twice a day but it has not improved. It has stayed about the same. She thinks it may be spreading.   Vaginal Discharge Has been having white discharge. No odor. Ongoing for about 5 days. No new partners. Reports no previous STI. Not using anything for protection. S/p hysterectomy. Does not want to do blood testing but does want to do swabs.   Last Pap smear 04/2020, NILM.   PERTINENT  PMH / PSH: Acrochordon, HSV-2, hidradenitis, molluscum   OBJECTIVE:   BP 99/74   Pulse 83   Wt 179 lb 3.2 oz (81.3 kg)   [name] 02/23/2021 (Approximate)   SpO2 100%   BMI 32.78 kg/m    General: NAD, pleasant, able to participate in exam Respiratory: normal effort GU: Normal appearance of labia majora and minora, without lesions. Vagina tissue pink, moist, without lesions or abrasions. White clumpy discharge noted at vaginal vault.  Skin: warm and dry, hidradenitis under left axilla with tracks.  There is scant white discharge from lesion. There is an area of slight hyperpigmentation around inferolateral axilla  Psych: Normal affect and mood  GU exam chaperoned by Joni Reining, CMA     ASSESSMENT/PLAN:   1. Routine screening for STI (sexually transmitted infection) Routine testing today.  Patient is status post hysterectomy.  - Cervicovaginal ancillary only - POCT Wet Prep [facility])  2. Hidradenitis suppurativa Under left axilla.  Does have an area that is draining.  Question if her pruritus under her axilla is a result of this. - doxycycline (VIBRA-TABS) 100 MG tablet; Take 1 tablet (100 mg  total) by mouth 2 (two) times daily for 28 days.  Dispense: 56 tablet; Refill: 0 - Hydroxyzine 10 mg TID PRN itching   3. Bacterial vaginosis Wet prep positive for BV.  Will treat with Flagyl, will send empiric Diflucan as well as patient is prone to yeast infections after antibiotics. - fluconazole (DIFLUCAN) 200 MG tablet; Yake one every other day by mouth for 3 doses  Dispense: 3 tablet; Refill: 0 - metroNIDAZOLE (FLAGYL) 500 MG tablet; Take 1 tablet (500 mg total) by mouth 2 (two) times daily for 7 days.  Dispense: 14 tablet; Refill: 0     Sabino Dick, DO [facility]

## 2022-05-25 NOTE — Patient Instructions (Incomplete)
It was wonderful to see you today.  Please bring ALL of your medications with you to every visit.   Today we talked about:  Try to keep the area clean and dry. I am sending an antibiotic called doxycycline which you should take twice a day for 4 weeks.  This will help with your boil. I think that the itchiness is a reaction to the boil.  Does not look like a fungal infection or eczematous at this time.  -We are checking for sexually transmitted infections including chlamydia, gonorrhea, trichomonas,  I will let you know of the results via MyChart or telephone call. We are also checking for bacterial vaginosis and yeast, which are not sexually transmitted infections.  -You should abstain from sexual activity until we have the results. If your test is positive for a sexually transmitted infection, it is important that both you and your partner are both treated.  -It is always important to use barrier protection, such as condoms, to help prevent sexually transmitted infections.    Thank you for coming to your visit as scheduled. We have had a large "no-show" problem lately, and this significantly limits our ability to see and care for patients. As a friendly reminder- if you cannot make your appointment please call to cancel. We do have a no show policy for those who do not cancel within 24 hours. Our policy is that if you miss or fail to cancel an appointment within 24 hours, 3 times in a 72-month period, you may be dismissed from our clinic.   Thank you for choosing [facility].   Please call [phone] with any questions about today's appointment.  Please be sure to schedule follow up at the front  desk before you leave today.   Sabino Dick, DO PGY-3 Family Medicine

## 2022-05-26 ENCOUNTER — Other Ambulatory Visit (HOSPITAL_COMMUNITY)
Admission: RE | Admit: 2022-05-26 | Discharge: 2022-05-26 | Disposition: A | Discharge status: Home / Self Care | Payer: Commercial Managed Care - HMO | Source: Ambulatory Visit | Attending: Family Medicine | Admitting: Family Medicine

## 2022-05-26 ENCOUNTER — Ambulatory Visit (INDEPENDENT_AMBULATORY_CARE_PROVIDER_SITE_OTHER): Payer: Commercial Managed Care - HMO | Admitting: Family Medicine

## 2022-05-26 ENCOUNTER — Encounter: Payer: Self-pay | Admitting: Family Medicine

## 2022-05-26 ENCOUNTER — Other Ambulatory Visit: Payer: Self-pay

## 2022-05-26 VITALS — BP 99/74 | HR 83 | Wt 179.2 lb

## 2022-05-26 DIAGNOSIS — L732 Hidradenitis suppurativa: Secondary | ICD-10-CM

## 2022-05-26 DIAGNOSIS — Z113 Encounter for screening for infections with a predominantly sexual mode of transmission: Secondary | ICD-10-CM | POA: Diagnosis present

## 2022-05-26 DIAGNOSIS — N760 Acute vaginitis: Secondary | ICD-10-CM

## 2022-05-26 DIAGNOSIS — B9689 Other specified bacterial agents as the cause of diseases classified elsewhere: Secondary | ICD-10-CM

## 2022-05-26 LAB — POCT WET PREP (WET MOUNT)
Clue Cells Wet Prep Whiff POC: POSITIVE
Trichomonas Wet Prep HPF POC: ABSENT

## 2022-05-26 MED ORDER — HYDROXYZINE HCL 10 MG PO TABS: 10.0000 mg | ORAL_TABLET | Freq: Three times a day (TID) | ORAL | 0 refills | Status: DC | PRN

## 2022-05-26 MED ORDER — DOXYCYCLINE HYCLATE 100 MG PO TABS: 100.0000 mg | ORAL_TABLET | Freq: Two times a day (BID) | ORAL | 0 refills | Status: AC

## 2022-05-26 MED ORDER — METRONIDAZOLE 500 MG PO TABS: 500.0000 mg | ORAL_TABLET | Freq: Two times a day (BID) | ORAL | 0 refills | Status: AC

## 2022-05-26 MED ORDER — FLUCONAZOLE 200 MG PO TABS: ORAL_TABLET | ORAL | 0 refills | Status: DC

## 2022-05-27 LAB — CERVICOVAGINAL ANCILLARY ONLY
Chlamydia: NEGATIVE
Comment: NEGATIVE
Comment: NORMAL
Neisseria Gonorrhea: NEGATIVE

## 2022-06-08 ENCOUNTER — Ambulatory Visit (HOSPITAL_COMMUNITY)
Admission: RE | Admit: 2022-06-08 | Discharge: 2022-06-08 | Disposition: A | Discharge status: Home / Self Care | Payer: Commercial Managed Care - HMO | Source: Ambulatory Visit | Attending: Emergency Medicine | Admitting: Emergency Medicine

## 2022-06-08 ENCOUNTER — Ambulatory Visit
Admission: RE | Admit: 2022-06-08 | Discharge: 2022-06-08 | Disposition: A | Discharge status: Home / Self Care | Payer: Commercial Managed Care - HMO | Source: Ambulatory Visit | Attending: Family Medicine | Admitting: Family Medicine

## 2022-06-08 ENCOUNTER — Encounter (HOSPITAL_COMMUNITY): Payer: Self-pay

## 2022-06-08 VITALS — BP 121/77 | HR 80 | Temp 98.6°F | Resp 16

## 2022-06-08 DIAGNOSIS — Z1231 Encounter for screening mammogram for malignant neoplasm of breast: Secondary | ICD-10-CM

## 2022-06-08 DIAGNOSIS — N760 Acute vaginitis: Secondary | ICD-10-CM | POA: Insufficient documentation

## 2022-06-08 DIAGNOSIS — Z113 Encounter for screening for infections with a predominantly sexual mode of transmission: Secondary | ICD-10-CM | POA: Diagnosis present

## 2022-06-08 LAB — HIV ANTIBODY (ROUTINE TESTING W REFLEX): HIV Screen 4th Generation wRfx: NONREACTIVE

## 2022-06-08 NOTE — ED Provider Notes (Signed)
[facility]    CSN: [id number] Arrival date & time: 06/08/22  1005      History   Chief Complaint Chief Complaint  Patient presents with   Vaginal Discharge    Std screening - Entered by patient    HPI [name] is a 42 y.o. female.  Here for STD testing Couple day history of cloudy vaginal discharge.  No odor or itching. Denies known exposure although reports recent intercourse where barrier protection failed.  No rash, lesions, abd pain, urinary symptoms S/P hysterectomy   Past Medical History:  Diagnosis Date   Arthritis    left ankle   COVID-19 09/24/2020   flu like symptoms per pt   Endometriosis    w/abnormal uterine bleeding, Pt follows with Dr. Jackson Latino, LOV 08/27/21 as of 10/22/21.   Ganglion cyst of foot    left foot, hx of aspiration, Follows with Dr. Rayburn Go, Sports Medicine, lov 10/06/21   GERD (gastroesophageal reflux disease)    occasional with spicy foods   History of 2019 novel coronavirus disease (COVID-19)    pt tested positive 03-03-2019, results in care everywhere.  (05-22-2019 per pt had symptoms of fatigue, headache, and loss of taste/ smell;  fatigue and headache after one week and loss taste/ smell resolved after 3 wks)   Infertility, female    Ovarian cyst    Pelvic mass in female    large fluid filled pelvic mass   Recurrent boils    Septate uterus    Trichomoniasis    hx of    Patient Active Problem List   Diagnosis Date Noted   Yeast infection 04/06/2022   Sore throat 04/02/2022   Bacterial vaginosis 02/06/2022   Adhesions of adnexa of uterus due to endometriosis 11/12/2021   Post-operative state 11/11/2021   Pelvic pain in female 11/11/2021   Pain of joint of left ankle and foot 10/06/2021   Screen for STD (sexually transmitted disease) 09/01/2021   Fatigue 09/01/2021   Abnormal uterine bleeding (AUB) 05/08/2021   Exposure to sexually transmitted disease (STD) 03/06/2021   Molluscum contagiosum  12/19/2020   Abdominal pain 08/16/2020   Constipation 08/16/2020   Frequent urination 06/24/2020   Hydradenitis 06/24/2020   Ganglion cyst of left foot 06/13/2020   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   Menorrhagia 12/25/2019   Vaginal discharge 07/08/2019   GERD (gastroesophageal reflux disease) 06/07/2019   HSV-2 seropositive 03/07/2019   Hydrosalpinx 12/30/2018   Endometriosis 05/01/2011   SEPTATE UTERUS 07/24/2009   TOBACCO ABUSE 09/26/2008   Female infertility 09/26/2008   Viral labyrinthitis 06/20/2007   Menometrorrhagia 09/21/2006    Past Surgical History:  Procedure Laterality Date   BREAST BIOPSY Right 1999   per pt benign    CYSTOSCOPY N/A 11/11/2021   Procedure: CYSTOSCOPY;  Surgeon: Willodean Rosenthal, MD;  Location: [facility] ;  Service: Gynecology;  Laterality: N/A;   DILITATION & CURRETTAGE/HYSTROSCOPY WITH NOVASURE ABLATION N/A 05/08/2021   Procedure: HYSTEROSCOPY, DILATATION & CURETTAGE, ENDOMETRIAL ABLATION WITH NOVASURE;  Surgeon: Willodean Rosenthal, MD;  Location: [facility];  Service: Gynecology;  Laterality: N/A;   INCISION AND DRAINAGE     right axilla, 2019, 2020, 2022, 2023   LAPAROSCOPIC SALPINGOOPHERECTOMY Right 03-25-2010   @[facility]    and LYSIS ADHESIONS   LAPAROSCOPY N/A 05/30/2019   Procedure: DIAGNOSTIC LAPAROSCOPY;  Surgeon: Adam Phenix, MD;  Location: [facility];  Service: Gynecology;  Laterality: N/A;   ROBOTIC ASSISTED TOTAL HYSTERECTOMY N/A 11/11/2021   Procedure: XI  ROBOTIC ASSISTED TOTAL HYSTERECTOMY WITH LEFT SALPINGECTOMY;  Surgeon: Willodean Rosenthal, MD;  Location: [facility];  Service: Gynecology;  Laterality: N/A;    OB History     Gravida  0   Para      Term      Preterm      AB      Living  0      SAB      IAB      Ectopic      Multiple      Live Births               Home Medications    Prior to Admission medications   Medication Sig Start Date End Date  Taking? Authorizing Provider  cetirizine (ZYRTEC) 10 MG tablet Take 1 tablet (10 mg total) by mouth daily as needed for allergies. Patient not taking: Reported on 05/26/2022 05/15/22   Littie Deeds, MD  clindamycin (CLINDAGEL) 1 % gel Apply topically 2 (two) times daily. 12/18/21   Bess Kinds, MD  doxycycline (VIBRA-TABS) 100 MG tablet Take 1 tablet (100 mg total) by mouth 2 (two) times daily for 28 days. 05/26/22 06/23/22  Sabino Dick, DO  fluconazole (DIFLUCAN) 200 MG tablet Yake one every other day by mouth for 3 doses 05/26/22   Sabino Dick, DO  hydrOXYzine (ATARAX) 10 MG tablet Take 1 tablet (10 mg total) by mouth 3 (three) times daily as needed. 05/26/22   Sabino Dick, DO  ibuprofen (ADVIL) 800 MG tablet Take 1 tablet (800 mg total) by mouth every 8 (eight) hours as needed. Patient not taking: Reported on 05/26/2022 12/24/21   Willodean Rosenthal, MD  pantoprazole (PROTONIX) 20 MG tablet TAKE 1 TABLET(20 MG) BY MOUTH DAILY Patient not taking: Reported on 03/25/2022 02/06/22   Erick Alley, DO  terconazole (TERAZOL 7) 0.4 % vaginal cream Place 1 applicator vaginally at bedtime. Patient not taking: Reported on 05/26/2022 04/22/22   Nestor Ramp, MD  Norethindrone Acetate-Ethinyl Estrad-FE (LOESTRIN 24 FE) 1-20 MG-MCG(24) tablet Take 1 tablet by mouth daily. 03/21/20 05/28/20  Willodean Rosenthal, MD  Norgestimate-Ethinyl Estradiol Triphasic (TRI-SPRINTEC) 0.18/0.215/0.25 MG-35 MCG tablet Take 1 tablet by mouth daily. 11/03/10 05/01/11  Adam Phenix, MD    Family History Family History  Problem Relation Age of Onset   Hypertension Mother    Diabetes Mother    Cancer Father    Hypertension Father    Diabetes Brother    Hypertension Brother    Breast cancer Neg Hx     Social History Social History   Tobacco Use   Smoking status: Every Day    Packs/day: 0.50    Years: 20.00    Additional pack years: 0.00    Total pack years: 10.00    Types: Cigarettes    Smokeless tobacco: Never  Vaping Use   Vaping Use: Never used  Substance Use Topics   Alcohol use: Yes    Comment: occasionally on weekends   Drug use: Never     Allergies   Sulfa antibiotics   Review of Systems Review of Systems  Genitourinary:  Positive for vaginal discharge.   As per HPI  Physical Exam Triage Vital Signs ED Triage Vitals [06/08/22 1031]  Enc Vitals Group     BP 121/77     Pulse Rate 80     Resp 16     Temp 98.6 F (37 C)     Temp Source Oral     SpO2 100 %  Weight      Height      Head Circumference      Peak Flow      Pain Score      Pain Loc      Pain Edu?      Excl. in GC?    No data found.  Updated Vital Signs BP 121/77 (BP Location: Left Arm)   Pulse 80   Temp 98.6 F (37 C) (Oral)   Resp 16   [name] 02/23/2021 (Approximate)   SpO2 100%    Physical Exam Vitals and nursing note reviewed.  Constitutional:      General: She is not in acute distress.    Appearance: Normal appearance.  HENT:     Mouth/Throat:     Pharynx: Oropharynx is clear.  Cardiovascular:     Rate and Rhythm: Normal rate and regular rhythm.     Pulses: Normal pulses.     Heart sounds: Normal heart sounds.  Pulmonary:     Effort: Pulmonary effort is normal.     Breath sounds: Normal breath sounds.  Neurological:     Mental Status: She is alert and oriented to person, place, and time.     UC Treatments / Results  Labs (all labs ordered are listed, but only abnormal results are displayed) Labs Reviewed  HIV ANTIBODY (ROUTINE TESTING W REFLEX)  RPR  CERVICOVAGINAL ANCILLARY ONLY    EKG  Radiology No results found.  Procedures Procedures (including critical care time)  Medications Ordered in UC Medications - No data to display  Initial Impression / Assessment and Plan / UC Course  I have reviewed the triage vital signs and the nursing notes.  Pertinent labs & imaging results that were available during my care of the patient were reviewed  by me and considered in my medical decision making (see chart for details).  Cytology swab is pending with HIV/RPR Discussed will treat positive result if indicated. No questions at this time  Final Clinical Impressions(s) / UC Diagnoses   Final diagnoses:  Screen for STD (sexually transmitted disease)     Discharge Instructions      We will call you if anything on your swab or blood work returns positive.  You will also be able to see these results on MyChart.  Please abstain from sexual intercourse until your results return.     ED Prescriptions   None    PDMP not reviewed this encounter.   Kathrine Haddock 06/08/22 1136

## 2022-06-08 NOTE — ED Triage Notes (Signed)
3-day history of cloudy vaginal discharge. No known exposure. Pt would like STI testing with blood work.

## 2022-06-08 NOTE — Discharge Instructions (Signed)
We will call you if anything on your swab or blood work returns positive.  You will also be able to see these results on MyChart.  Please abstain from sexual intercourse until your results return. 

## 2022-06-09 ENCOUNTER — Telehealth (HOSPITAL_COMMUNITY): Payer: Self-pay | Admitting: Emergency Medicine

## 2022-06-09 LAB — CERVICOVAGINAL ANCILLARY ONLY
Bacterial Vaginitis (gardnerella): POSITIVE — AB
Candida Glabrata: NEGATIVE
Candida Vaginitis: NEGATIVE
Chlamydia: NEGATIVE
Comment: NEGATIVE
Comment: NEGATIVE
Comment: NEGATIVE
Comment: NEGATIVE
Comment: NEGATIVE
Comment: NORMAL
Neisseria Gonorrhea: NEGATIVE
Trichomonas: NEGATIVE

## 2022-06-09 LAB — RPR: RPR Ser Ql: NONREACTIVE

## 2022-06-09 MED ORDER — METRONIDAZOLE 0.75 % VA GEL: 1.0000 | Freq: Every day | VAGINAL | 0 refills | Status: AC

## 2022-06-22 ENCOUNTER — Ambulatory Visit (INDEPENDENT_AMBULATORY_CARE_PROVIDER_SITE_OTHER): Payer: Commercial Managed Care - HMO | Admitting: Family Medicine

## 2022-06-22 ENCOUNTER — Encounter: Payer: Self-pay | Admitting: Family Medicine

## 2022-06-22 VITALS — BP 100/60 | HR 67 | Temp 98.2°F | Ht 62.0 in | Wt 176.0 lb

## 2022-06-22 DIAGNOSIS — J069 Acute upper respiratory infection, unspecified: Secondary | ICD-10-CM | POA: Diagnosis not present

## 2022-06-22 MED ORDER — GUAIFENESIN ER 600 MG PO TB12: 600.0000 mg | ORAL_TABLET | Freq: Two times a day (BID) | ORAL | 0 refills | Status: DC

## 2022-06-22 MED ORDER — BENZONATATE 100 MG PO CAPS: 100.0000 mg | ORAL_CAPSULE | Freq: Two times a day (BID) | ORAL | 0 refills | Status: DC | PRN

## 2022-06-22 NOTE — Patient Instructions (Addendum)
It was wonderful to see you today.  Please bring ALL of your medications with you to every visit.   Today we talked about:  I believe that you are fighting a viral infection.  Unfortunately the only treatment for this is supportive care.  You can alternate Tylenol and Ibuprofen every 6 hours as needed for pain or fever. I encourage you to use nasal saline sprays, Vicks vapor rub (under your nose and on your chest) to help your congestion.  You can use sore throat spray to help with the discomfort. Honey, cough drops, and tessalon perles may help with your cough. I have also sent in a prescription for Mucinex to help with chest congestion. Drink plenty of fluids! Alternate Tylenol and Ibuprofen every 6 hours as needed for pain or fevers.   Thank you for coming to your visit as scheduled. We have had a large "no-show" problem lately, and this significantly limits our ability to see and care for patients. As a friendly reminder- if you cannot make your appointment please call to cancel. We do have a no show policy for those who do not cancel within 24 hours. Our policy is that if you miss or fail to cancel an appointment within 24 hours, 3 times in a 70-month period, you may be dismissed from our clinic.   Thank you for choosing [facility].   Please call [phone] with any questions about today's appointment.  Please be sure to schedule follow up at the front  desk before you leave today.   Sabino Dick, DO PGY-3 Family Medicine

## 2022-06-22 NOTE — Progress Notes (Signed)
    SUBJECTIVE:   CHIEF COMPLAINT / HPI:   [name] is a 42 y.o. female who presents to the [facility] clinic today to discuss the following concerns:   Head/Chest Congestion Started last Thursday. Felt congested in her head. She tried Sudafed and Robitussin. Also tried Theraflu without any improvement. Has not had fevers. She was out of work last Thursday but did not Friday even though she didn't feel well. 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 No recent travel or sick contacts.  She has also been coughing, started yesterday. Seemed really bad last night. Has some green productive sputum. No shortness of breath. No abdominal pain. Endorses slight nausea but no vomiting. Decreased appetite but staying hydrated. Feels body aches. States she took at home COVID test that was negative. Reports that she has had 3 COVID vaccines and is UTD on influenza vaccine.    PERTINENT  PMH / PSH: GERD, hidradenitis, tobacco use disorder  OBJECTIVE:   BP 100/60   Pulse 67   Temp 98.2 F (36.8 C) (Oral)   Ht 5\' 2"  (1.575 m)   Wt 176 lb (79.8 kg)   [name] 02/23/2021 (Approximate)   SpO2 99%   BMI 32.19 kg/m    General: NAD, pleasant, able to participate in exam HEENT: Normocephalic, nares congested and with rhinorrhea. No ttp of maxillary, ethmoid, or frontal sinuses bilaterally. TM clear b/l. Oropharynx clear without erythema or tonsillar exudates.  Neck: Supple, no cervical LAD  Cardiac: RRR, no murmurs. Respiratory: CTAB, normal effort, No wheezes, rales or rhonchi Abdomen: Non-tender in all quadrants  Extremities: no edema  Skin: warm and dry  ASSESSMENT/PLAN:   1. Viral URI with cough Day 5 of symptoms. Afebrile Vital signs are appropriate. History and examination seem most consistent with Viral URI. No fever, hypoxia, or abnormal lung findings to suggest pneumonia. No sinus ttp and symptom duration does not seem to suggest bacterial sinusitis. Will treat symptomatically.  - benzonatate  (TESSALON) 100 MG capsule; Take 1 capsule (100 mg total) by mouth 2 (two) times daily as needed for cough.  Dispense: 20 capsule; Refill: 0 - guaiFENesin (MUCINEX) 600 MG 12 hr tablet; Take 1 tablet (600 mg total) by mouth 2 (two) times daily.  Dispense: 30 tablet; Refill: 0 - See AVS for additional instructions - Return for SOB, chest pain, dehydration, worsening symptoms   Kerri Dick, DO [facility]

## 2022-06-29 ENCOUNTER — Ambulatory Visit (HOSPITAL_COMMUNITY): Payer: Self-pay

## 2022-06-30 ENCOUNTER — Ambulatory Visit (HOSPITAL_COMMUNITY): Payer: Commercial Managed Care - HMO

## 2022-07-01 ENCOUNTER — Ambulatory Visit (HOSPITAL_COMMUNITY)
Admission: RE | Admit: 2022-07-01 | Discharge: 2022-07-01 | Disposition: A | Discharge status: Home / Self Care | Payer: Commercial Managed Care - HMO | Source: Ambulatory Visit | Attending: Emergency Medicine | Admitting: Emergency Medicine

## 2022-07-01 ENCOUNTER — Encounter (HOSPITAL_COMMUNITY): Payer: Self-pay

## 2022-07-01 VITALS — BP 109/75 | HR 85 | Temp 98.2°F | Resp 20

## 2022-07-01 DIAGNOSIS — R053 Chronic cough: Secondary | ICD-10-CM | POA: Diagnosis present

## 2022-07-01 DIAGNOSIS — J309 Allergic rhinitis, unspecified: Secondary | ICD-10-CM | POA: Diagnosis present

## 2022-07-01 DIAGNOSIS — N898 Other specified noninflammatory disorders of vagina: Secondary | ICD-10-CM | POA: Diagnosis present

## 2022-07-01 MED ORDER — FLUTICASONE PROPIONATE 50 MCG/ACT NA SUSP: 1.0000 | Freq: Every day | NASAL | 2 refills | Status: DC

## 2022-07-01 MED ORDER — PROMETHAZINE-DM 6.25-15 MG/5ML PO SYRP: 5.0000 mL | ORAL_SOLUTION | Freq: Every evening | ORAL | 0 refills | Status: DC | PRN

## 2022-07-01 MED ORDER — METHYLPREDNISOLONE ACETATE 80 MG/ML IJ SUSP
60.0000 mg | Freq: Once | INTRAMUSCULAR | Status: AC
  Administered 2022-07-01: 60 mg via INTRAMUSCULAR

## 2022-07-01 MED ORDER — METHYLPREDNISOLONE ACETATE 80 MG/ML IJ SUSP
INTRAMUSCULAR | Status: AC
  Filled 2022-07-01: qty 1

## 2022-07-01 MED ORDER — CETIRIZINE HCL 10 MG PO TABS: 10.0000 mg | ORAL_TABLET | Freq: Every day | ORAL | 1 refills | Status: DC

## 2022-07-01 NOTE — ED Provider Notes (Signed)
[facility]    CSN: [id number] Arrival date & time: 07/01/22  1351    HISTORY   Chief Complaint  Patient presents with   Cough   Vaginal Discharge   Appt    1430   HPI [name] is a pleasant, 42 y.o. female who presents to urgent care today. Patient complains of abnormal vaginal discharge for the past 5 days.  Patient denies burning with urination, increased frequency of urination, suprapubic pain, perineal pain, flank pain, fever, chills, malaise, rigors, significant fatigue, vaginal itching, vaginal irritation, dyspareunia, genital lesion(s), known exposure to STD, and possible exposure to STD.    Patient also complains of 3-week history of cough productive of greenish colored sputum.  Patient states she was seen by her primary care provider a week ago, was prescribed Mucinex and cough pills which she states are not helping.  Patient has normal vital signs on arrival today.   The history is provided by the patient.   Past Medical History:  Diagnosis Date   Arthritis    left ankle   COVID-19 09/24/2020   flu like symptoms per pt   Endometriosis    w/abnormal uterine bleeding, Pt follows with Dr. Jackson Latino, LOV 08/27/21 as of 10/22/21.   Ganglion cyst of foot    left foot, hx of aspiration, Follows with Dr. Rayburn Go, Sports Medicine, lov 10/06/21   GERD (gastroesophageal reflux disease)    occasional with spicy foods   History of 2019 novel coronavirus disease (COVID-19)    pt tested positive 03-03-2019, results in care everywhere.  (05-22-2019 per pt had symptoms of fatigue, headache, and loss of taste/ smell;  fatigue and headache after one week and loss taste/ smell resolved after 3 wks)   Infertility, female    Ovarian cyst    Pelvic mass in female    large fluid filled pelvic mass   Recurrent boils    Septate uterus    Trichomoniasis    hx of   Patient Active Problem List   Diagnosis Date Noted   Yeast infection 04/06/2022   Sore  throat 04/02/2022   Bacterial vaginosis 02/06/2022   Adhesions of adnexa of uterus due to endometriosis 11/12/2021   Post-operative state 11/11/2021   Pelvic pain in female 11/11/2021   Pain of joint of left ankle and foot 10/06/2021   Screen for STD (sexually transmitted disease) 09/01/2021   Fatigue 09/01/2021   Abnormal uterine bleeding (AUB) 05/08/2021   Exposure to sexually transmitted disease (STD) 03/06/2021   Molluscum contagiosum 12/19/2020   Abdominal pain 08/16/2020   Constipation 08/16/2020   Frequent urination 06/24/2020   Hydradenitis 06/24/2020   Ganglion cyst of left foot 06/13/2020   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   Menorrhagia 12/25/2019   Vaginal discharge 07/08/2019   GERD (gastroesophageal reflux disease) 06/07/2019   HSV-2 seropositive 03/07/2019   Hydrosalpinx 12/30/2018   Endometriosis 05/01/2011   SEPTATE UTERUS 07/24/2009   TOBACCO ABUSE 09/26/2008   Female infertility 09/26/2008   Viral labyrinthitis 06/20/2007   Menometrorrhagia 09/21/2006   Past Surgical History:  Procedure Laterality Date   BREAST BIOPSY Right 1999   per pt benign    CYSTOSCOPY N/A 11/11/2021   Procedure: CYSTOSCOPY;  Surgeon: Willodean Rosenthal, MD;  Location: [facility];  Service: Gynecology;  Laterality: N/A;   DILITATION & CURRETTAGE/HYSTROSCOPY WITH NOVASURE ABLATION N/A 05/08/2021   Procedure: HYSTEROSCOPY, DILATATION & CURETTAGE, ENDOMETRIAL ABLATION WITH NOVASURE;  Surgeon: Willodean Rosenthal, MD;  Location: [facility];  Service: Gynecology;  Laterality: N/A;   INCISION AND DRAINAGE     right axilla, 2019, 2020, 2022, 2023   LAPAROSCOPIC SALPINGOOPHERECTOMY Right 03-25-2010   @[facility]    and LYSIS ADHESIONS   LAPAROSCOPY N/A 05/30/2019   Procedure: DIAGNOSTIC LAPAROSCOPY;  Surgeon: Adam Phenix, MD;  Location: [facility];  Service: Gynecology;  Laterality: N/A;   ROBOTIC ASSISTED TOTAL HYSTERECTOMY N/A 11/11/2021   Procedure: XI ROBOTIC  ASSISTED TOTAL HYSTERECTOMY WITH LEFT SALPINGECTOMY;  Surgeon: Willodean Rosenthal, MD;  Location: [facility];  Service: Gynecology;  Laterality: N/A;   OB History     Gravida  0   Para      Term      Preterm      AB      Living  0      SAB      IAB      Ectopic      Multiple      Live Births             Home Medications    Prior to Admission medications   Medication Sig Start Date End Date Taking? Authorizing Provider  benzonatate (TESSALON) 100 MG capsule Take 1 capsule (100 mg total) by mouth 2 (two) times daily as needed for cough. 06/22/22  Yes Sabino Dick, DO  guaiFENesin (MUCINEX) 600 MG 12 hr tablet Take 1 tablet (600 mg total) by mouth 2 (two) times daily. 06/22/22  Yes Sabino Dick, DO  cetirizine (ZYRTEC) 10 MG tablet Take 1 tablet (10 mg total) by mouth daily as needed for allergies. Patient not taking: Reported on 05/26/2022 05/15/22   Littie Deeds, MD  clindamycin (CLINDAGEL) 1 % gel Apply topically 2 (two) times daily. Patient not taking: Reported on 06/22/2022 12/18/21   Bess Kinds, MD  fluconazole (DIFLUCAN) 200 MG tablet Catha Brow one every other day by mouth for 3 doses Patient not taking: Reported on 06/22/2022 05/26/22   Sabino Dick, DO  hydrOXYzine (ATARAX) 10 MG tablet Take 1 tablet (10 mg total) by mouth 3 (three) times daily as needed. Patient not taking: Reported on 06/22/2022 05/26/22   Sabino Dick, DO  ibuprofen (ADVIL) 800 MG tablet Take 1 tablet (800 mg total) by mouth every 8 (eight) hours as needed. Patient not taking: Reported on 05/26/2022 12/24/21   Willodean Rosenthal, MD  metroNIDAZOLE (METROGEL) 1 % gel Apply topically daily.    [provider]  pantoprazole (PROTONIX) 20 MG tablet TAKE 1 TABLET(20 MG) BY MOUTH DAILY Patient not taking: Reported on 03/25/2022 02/06/22   Erick Alley, DO  terconazole (TERAZOL 7) 0.4 % vaginal cream Place 1 applicator vaginally at  bedtime. Patient not taking: Reported on 05/26/2022 04/22/22   Nestor Ramp, MD  Norethindrone Acetate-Ethinyl Estrad-FE (LOESTRIN 24 FE) 1-20 MG-MCG(24) tablet Take 1 tablet by mouth daily. 03/21/20 05/28/20  Willodean Rosenthal, MD  Norgestimate-Ethinyl Estradiol Triphasic (TRI-SPRINTEC) 0.18/0.215/0.25 MG-35 MCG tablet Take 1 tablet by mouth daily. 11/03/10 05/01/11  Adam Phenix, MD    Family History Family History  Problem Relation Age of Onset   Hypertension Mother    Diabetes Mother    Cancer Father    Hypertension Father    Diabetes Brother    Hypertension Brother    Breast cancer Neg Hx    Social History Social History   Tobacco Use   Smoking status: Every Day    Packs/day: 0.50    Years: 20.00    Additional pack years: 0.00    Total pack  years: 10.00    Types: Cigarettes   Smokeless tobacco: Never  Vaping Use   Vaping Use: Never used  Substance Use Topics   Alcohol use: Yes    Comment: occasionally on weekends   Drug use: Never   Allergies   Sulfa antibiotics  Review of Systems Review of Systems Pertinent findings revealed after performing a 14 point review of systems has been noted in the history of present illness.  Physical Exam Vital Signs BP 109/75   Pulse 85   Temp 98.2 F (36.8 C) (Oral)   Resp 20   [name] 02/23/2021 (Approximate)   SpO2 98%   No data found.  Physical Exam Vitals and nursing note reviewed.  Constitutional:      General: She is not in acute distress.    Appearance: Normal appearance. She is not ill-appearing.  HENT:     Head: Normocephalic and atraumatic.     Salivary Glands: Right salivary gland is not diffusely enlarged or tender. Left salivary gland is not diffusely enlarged or tender.     Right Ear: Ear canal and external ear normal. No drainage. A middle ear effusion is present. There is no impacted cerumen. Tympanic membrane is bulging. Tympanic membrane is not injected or erythematous.     Left Ear: Ear canal and  external ear normal. No drainage. A middle ear effusion is present. There is no impacted cerumen. Tympanic membrane is bulging. Tympanic membrane is not injected or erythematous.     Ears:     Comments: Bilateral EACs normal, both TMs bulging with clear fluid    Nose: Rhinorrhea present. No nasal deformity, septal deviation, signs of injury, nasal tenderness, mucosal edema or congestion. Rhinorrhea is clear.     Right Nostril: Occlusion present. No foreign body, epistaxis or septal hematoma.     Left Nostril: Occlusion present. No foreign body, epistaxis or septal hematoma.     Right Turbinates: Enlarged, swollen and pale.     Left Turbinates: Enlarged, swollen and pale.     Right Sinus: No maxillary sinus tenderness or frontal sinus tenderness.     Left Sinus: No maxillary sinus tenderness or frontal sinus tenderness.     Mouth/Throat:     Lips: Pink. No lesions.     Mouth: Mucous membranes are moist. No oral lesions.     Pharynx: Oropharynx is clear. Uvula midline. No posterior oropharyngeal erythema or uvula swelling.     Tonsils: No tonsillar exudate. 0 on the right. 0 on the left.     Comments: Postnasal drip Eyes:     General: Lids are normal.        Right eye: No discharge.        Left eye: No discharge.     Extraocular Movements: Extraocular movements intact.     Conjunctiva/sclera: Conjunctivae normal.     Right eye: Right conjunctiva is not injected.     Left eye: Left conjunctiva is not injected.  Neck:     Trachea: Trachea and phonation normal.  Cardiovascular:     Rate and Rhythm: Normal rate and regular rhythm.     Pulses: Normal pulses.     Heart sounds: Normal heart sounds. No murmur heard.    No friction rub. No gallop.  Pulmonary:     Effort: Pulmonary effort is normal. No accessory muscle usage, prolonged expiration or respiratory distress.     Breath sounds: Normal breath sounds. No stridor, decreased air movement or transmitted upper airway sounds. No decreased  breath sounds, wheezing, rhonchi or rales.  Chest:     Chest wall: No tenderness.  Genitourinary:    Comments: Patient politely declines pelvic exam today, patient provided a vaginal swab for testing. Musculoskeletal:        General: Normal range of motion.     Cervical back: Normal range of motion and neck supple. Normal range of motion.  Lymphadenopathy:     Cervical: No cervical adenopathy.  Skin:    General: Skin is warm and dry.     Findings: No erythema or rash.  Neurological:     General: No focal deficit present.     Mental Status: She is alert and oriented to person, place, and time.  Psychiatric:        Mood and Affect: Mood normal.        Behavior: Behavior normal.     Visual Acuity Right Eye Distance:   Left Eye Distance:   Bilateral Distance:    Right Eye Near:   Left Eye Near:    Bilateral Near:     UC Couse / Diagnostics / Procedures:     Radiology No results found.  Procedures Procedures (including critical care time) EKG  Pending results:  Labs Reviewed  CERVICOVAGINAL ANCILLARY ONLY    Medications Ordered in UC: Medications  methylPREDNISolone acetate (DEPO-MEDROL) injection 60 mg (60 mg Intramuscular Given 07/01/22 1433)    UC Diagnoses / Final Clinical Impressions(s)   I have reviewed the triage vital signs and the nursing notes.  Pertinent labs & imaging results that were available during my care of the patient were reviewed by me and considered in my medical decision making (see chart for details).    Final diagnoses:  Allergic rhinitis, unspecified seasonality, unspecified trigger  Persistent cough for 3 weeks or longer  Problematic vaginal discharge   STD screening was performed, patient advised that the results be posted to their MyChart and if any of the results are positive, they will be notified by phone, further treatment will be provided as indicated based on results of STD screening. Patient was advised to abstain from sexual  intercourse until that they receive the results of their STD testing.  Patient was also advised to use condoms to protect themselves from STD exposure.  Patient was advised that physical exam findings concerning for uncontrolled upper respiratory allergies.  Patient provided with injection of Solu-Medrol for severe nasal congestion and postnasal drip.  Patient was also provided with cetirizine and Flonase that she was advised to take daily through the end of spring and into the summer.  Conservative care recommended.  Return precautions advised.  Please see discharge instructions below for details of plan of care as provided to patient. ED Prescriptions     Medication Sig Dispense Auth. Provider   cetirizine (ZYRTEC ALLERGY) 10 MG tablet Take 1 tablet (10 mg total) by mouth at bedtime. 90 tablet Theadora Rama Scales, PA-C   fluticasone (FLONASE) 50 MCG/ACT nasal spray Place 1 spray into both nostrils daily. Begin by using 2 sprays in each nare daily for 3 to 5 days, then decrease to 1 spray in each nare daily. 15.8 mL Theadora Rama Scales, PA-C   promethazine-dextromethorphan (PROMETHAZINE-DM) 6.25-15 MG/5ML syrup Take 5 mLs by mouth at bedtime as needed for cough. 60 mL Theadora Rama Scales, PA-C      PDMP not reviewed this encounter.  Disposition Upon Discharge:  Condition: stable for discharge home  Patient presented with concern for an acute illness with associated  systemic symptoms and significant discomfort requiring urgent management. In my opinion, this is a condition that a prudent lay person (someone who possesses an average knowledge of health and medicine) may potentially expect to result in complications if not addressed urgently such as respiratory distress, impairment of bodily function or dysfunction of bodily organs.   As such, the patient has been evaluated and assessed, work-up was performed and treatment was provided in alignment with urgent care protocols and evidence  based medicine.  Patient/parent/caregiver has been advised that the patient may require follow up for further testing and/or treatment if the symptoms continue in spite of treatment, as clinically indicated and appropriate.  Routine symptom specific, illness specific and/or disease specific instructions were discussed with the patient and/or caregiver at length.  Prevention strategies for avoiding STD exposure were also discussed.  The patient will follow up with their current PCP if and as advised. If the patient does not currently have a PCP we will assist them in obtaining one.   The patient may need specialty follow up if the symptoms continue, in spite of conservative treatment and management, for further workup, evaluation, consultation and treatment as clinically indicated and appropriate.  Patient/parent/caregiver verbalized understanding and agreement of plan as discussed.  All questions were addressed during visit.  Please see discharge instructions below for further details of plan.  Discharge Instructions:   Discharge Instructions      Your symptoms and my physical exam findings are concerning for exacerbation of your underlying allergies.     To avoid catching frequent respiratory infections, having skin reactions, dealing with eye irritation, losing sleep, missing work, etc., due to uncontrolled allergies, it is important that you begin/continue your allergy regimen and are consistent with taking your meds exactly as prescribed.   Solu-Medrol IM (methylprednisolone):  To quickly address your significant respiratory inflammation, you were provided with an injection of Solu-Medrol in the office today.  You should continue to feel the full benefit of the steroid for the next 4 to 6 hours.    Zyrtec (cetirizine): This is an excellent second-generation antihistamine that helps to reduce respiratory inflammatory response to environmental allergens.  In some patients, this medication can  cause daytime sleepiness so I recommend that you take 1 tablet daily at bedtime.     Flonase (fluticasone): This is a steroid nasal spray that used once daily, 1 spray in each nare.  This works best when used on a daily basis. This medication does not work well if it is only used when you think you need it.  After 3 to 5 days of use, you will notice significant reduction of the inflammation and mucus production that is currently being caused by exposure to allergens, whether seasonal or environmental.  The most common side effect of this medication is nosebleeds.  If you experience a nosebleed, please discontinue use for 1 week, then feel free to resume.  If you find that your insurance will not pay for this medication, please consider a different nasal steroids such as Nasonex (mometasone), or Nasacort (triamcinolone).   Promethazine DM: Promethazine is both a nasal decongestant that dries up mucous membranes and an antinausea medication.  Promethazine often makes most patients feel fairly sleepy.  "DM" is dextromethorphan, a single symptom reliever which is a cough suppressant found in many over-the-counter cough medications and combination cold preparations.  Please take 5 mL before bedtime to minimize your cough which will help you sleep better.  I have sent a prescription for  this medication to your pharmacy because it cannot be purchased over-the-counter.   If symptoms of congestion and coughing have not meaningfully improved in the next 5 to 7 days, please return for repeat evaluation or follow-up with your regular provider.  If these symptoms have worsened in the next 3 to 5 days, please return for repeat evaluation or follow-up with your regular provider.   The results of your vaginal swab test which screens for BV, yeast, gonorrhea, chlamydia and trichomonas will be made posted to your MyChart account once it is complete.  This typically takes 2 to 4 days.  Please abstain from sexual intercourse of  any kind, vaginal, oral or anal, until you have received the results of your STD testing.     If any of your results are abnormal, you will receive a phone call regarding treatment.  Prescriptions, if any are needed, will be provided for you at your pharmacy.     If you have not had complete resolution of your abnormal vaginal discharge after completing any recommended treatment or if your symptoms worsen, please return for repeat evaluation.   Thank you for visiting [facility] today.  I appreciate the opportunity to participate in your care.          This office note has been dictated using Teaching laboratory technician.  Unfortunately, this method of dictation can sometimes lead to typographical or grammatical errors.  I apologize for your inconvenience in advance if this occurs.  Please do not hesitate to reach out to me if clarification is needed.       Theadora Rama Scales, PA-C 07/01/22 1442

## 2022-07-01 NOTE — Discharge Instructions (Addendum)
Your symptoms and my physical exam findings are concerning for exacerbation of your underlying allergies.     To avoid catching frequent respiratory infections, having skin reactions, dealing with eye irritation, losing sleep, missing work, etc., due to uncontrolled allergies, it is important that you begin/continue your allergy regimen and are consistent with taking your meds exactly as prescribed.   Solu-Medrol IM (methylprednisolone):  To quickly address your significant respiratory inflammation, you were provided with an injection of Solu-Medrol in the office today.  You should continue to feel the full benefit of the steroid for the next 4 to 6 hours.    Zyrtec (cetirizine): This is an excellent second-generation antihistamine that helps to reduce respiratory inflammatory response to environmental allergens.  In some patients, this medication can cause daytime sleepiness so I recommend that you take 1 tablet daily at bedtime.     Flonase (fluticasone): This is a steroid nasal spray that used once daily, 1 spray in each nare.  This works best when used on a daily basis. This medication does not work well if it is only used when you think you need it.  After 3 to 5 days of use, you will notice significant reduction of the inflammation and mucus production that is currently being caused by exposure to allergens, whether seasonal or environmental.  The most common side effect of this medication is nosebleeds.  If you experience a nosebleed, please discontinue use for 1 week, then feel free to resume.  If you find that your insurance will not pay for this medication, please consider a different nasal steroids such as Nasonex (mometasone), or Nasacort (triamcinolone).   Promethazine DM: Promethazine is both a nasal decongestant that dries up mucous membranes and an antinausea medication.  Promethazine often makes most patients feel fairly sleepy.  "DM" is dextromethorphan, a single symptom reliever which is  a cough suppressant found in many over-the-counter cough medications and combination cold preparations.  Please take 5 mL before bedtime to minimize your cough which will help you sleep better.  I have sent a prescription for this medication to your pharmacy because it cannot be purchased over-the-counter.   If symptoms of congestion and coughing have not meaningfully improved in the next 5 to 7 days, please return for repeat evaluation or follow-up with your regular provider.  If these symptoms have worsened in the next 3 to 5 days, please return for repeat evaluation or follow-up with your regular provider.   The results of your vaginal swab test which screens for BV, yeast, gonorrhea, chlamydia and trichomonas will be made posted to your MyChart account once it is complete.  This typically takes 2 to 4 days.  Please abstain from sexual intercourse of any kind, vaginal, oral or anal, until you have received the results of your STD testing.     If any of your results are abnormal, you will receive a phone call regarding treatment.  Prescriptions, if any are needed, will be provided for you at your pharmacy.     If you have not had complete resolution of your abnormal vaginal discharge after completing any recommended treatment or if your symptoms worsen, please return for repeat evaluation.   Thank you for visiting [facility] today.  I appreciate the opportunity to participate in your care.

## 2022-07-01 NOTE — ED Triage Notes (Signed)
C/O productive cough with greenish sputum, onset 3 wks ago; saw PCP 1 wk ago and was given Mucinex and cough pills -- states not helping. Denies fevers. Also c/o vaginal discharge onset approx 5 days ago. Denies any abd pain.

## 2022-07-02 LAB — CERVICOVAGINAL ANCILLARY ONLY
Bacterial Vaginitis (gardnerella): POSITIVE — AB
Candida Glabrata: NEGATIVE
Candida Vaginitis: NEGATIVE
Chlamydia: NEGATIVE
Comment: NEGATIVE
Comment: NEGATIVE
Comment: NEGATIVE
Comment: NEGATIVE
Comment: NEGATIVE
Comment: NORMAL
Neisseria Gonorrhea: NEGATIVE
Trichomonas: NEGATIVE

## 2022-07-03 ENCOUNTER — Telehealth (HOSPITAL_COMMUNITY): Payer: Self-pay | Admitting: Emergency Medicine

## 2022-07-03 MED ORDER — METRONIDAZOLE 0.75 % VA GEL: 1.0000 | Freq: Every day | VAGINAL | 0 refills | Status: AC

## 2022-07-16 ENCOUNTER — Ambulatory Visit (INDEPENDENT_AMBULATORY_CARE_PROVIDER_SITE_OTHER): Payer: Commercial Managed Care - HMO | Admitting: Family Medicine

## 2022-07-16 ENCOUNTER — Ambulatory Visit: Payer: Commercial Managed Care - HMO | Admitting: Family Medicine

## 2022-07-16 ENCOUNTER — Other Ambulatory Visit: Payer: Self-pay | Admitting: Family Medicine

## 2022-07-16 ENCOUNTER — Other Ambulatory Visit (HOSPITAL_COMMUNITY)
Admission: RE | Admit: 2022-07-16 | Discharge: 2022-07-16 | Disposition: A | Discharge status: Home / Self Care | Payer: Commercial Managed Care - HMO | Source: Ambulatory Visit | Attending: Family Medicine | Admitting: Family Medicine

## 2022-07-16 VITALS — BP 110/70 | Ht 62.0 in | Wt 172.0 lb

## 2022-07-16 DIAGNOSIS — N898 Other specified noninflammatory disorders of vagina: Secondary | ICD-10-CM | POA: Insufficient documentation

## 2022-07-16 DIAGNOSIS — B3731 Acute candidiasis of vulva and vagina: Secondary | ICD-10-CM

## 2022-07-16 LAB — POCT WET PREP (WET MOUNT)
Clue Cells Wet Prep Whiff POC: NEGATIVE
Trichomonas Wet Prep HPF POC: ABSENT

## 2022-07-16 MED ORDER — FLUCONAZOLE 150 MG PO TABS
150.0000 mg | ORAL_TABLET | Freq: Once | ORAL | 0 refills | Status: AC
Start: 2022-07-16 — End: 2022-07-16

## 2022-07-16 NOTE — Assessment & Plan Note (Signed)
-  pending wet prep and GC/Chlamydia testing  -patient desiring RPR and HIV testing as well although just performed April 2024 -not needing PAP smear as she is s/p hysterectomy  -safe sex counseling provided -patient aware that I will notify her of abnormal results, awaiting results but will likely treat with fluconazole for presumable candida vaginitis -follow up as appropriate

## 2022-07-16 NOTE — Progress Notes (Signed)
    SUBJECTIVE:   CHIEF COMPLAINT / HPI:   Patient presents with concern of vaginal discharge for the past 2-3 weeks. Denies dysuria, abdominal or pelvic pain, fever, chills or new partners.Does usually use protection but says that the last time it broke off so she wants to get everything checked to make sure. S/p hysterectomy, denies any other concerns at this time.   OBJECTIVE:   BP 110/70   Ht 5\' 2"  (1.575 m)   Wt 172 lb (78 kg)   [name] 02/23/2021 (Approximate)   BMI 31.46 kg/m   General: Patient well-appearing, in no acute distress. Resp: normal work of breathing noted GU: normal labia without rashes or external lesions noted, white cervical discharge noted, normal vagina, no polyps or masses noted  GU exam performed in the presence of chaperone, Jazmin Hartsell, CMA.   ASSESSMENT/PLAN:   Vaginal discharge -pending wet prep and GC/Chlamydia testing  -patient desiring RPR and HIV testing as well although just performed April 2024 -not needing PAP smear as she is s/p hysterectomy  -safe sex counseling provided -patient aware that I will notify her of abnormal results, awaiting results but will likely treat with fluconazole for presumable candida vaginitis -follow up as appropriate      Darragh Nay Robyne Peers, DO [facility]

## 2022-07-16 NOTE — Patient Instructions (Addendum)
It was great seeing you today!  Today we discussed your vaginal discharge, I will inform you of the results from the testing and we will prescribe treatment as appropriate.   Please make sure to use barrier protection each time.  Please follow up at your next scheduled appointment, if anything arises between now and then, please don't hesitate to contact our office.   Thank you for allowing [place] to be a part of your medical care!  Thank you, Dr. Robyne Peers

## 2022-07-17 LAB — CERVICOVAGINAL ANCILLARY ONLY
Bacterial Vaginitis (gardnerella): NEGATIVE
Candida Glabrata: NEGATIVE
Candida Vaginitis: POSITIVE — AB
Chlamydia: NEGATIVE
Comment: NEGATIVE
Comment: NEGATIVE
Comment: NEGATIVE
Comment: NEGATIVE
Comment: NEGATIVE
Comment: NORMAL
Neisseria Gonorrhea: NEGATIVE
Trichomonas: NEGATIVE

## 2022-07-18 LAB — TREPONEMAL ANTIBODIES, TPPA: Treponemal Antibodies, TPPA: NONREACTIVE

## 2022-07-18 LAB — HIV ANTIBODY (ROUTINE TESTING W REFLEX): HIV Screen 4th Generation wRfx: NONREACTIVE

## 2022-07-18 LAB — RPR W/REFLEX TO TREPSURE: RPR: NONREACTIVE

## 2022-07-22 ENCOUNTER — Ambulatory Visit (INDEPENDENT_AMBULATORY_CARE_PROVIDER_SITE_OTHER): Payer: Commercial Managed Care - HMO

## 2022-07-22 ENCOUNTER — Encounter (HOSPITAL_COMMUNITY): Payer: Self-pay

## 2022-07-22 ENCOUNTER — Ambulatory Visit (HOSPITAL_COMMUNITY)
Admission: RE | Admit: 2022-07-22 | Discharge: 2022-07-22 | Disposition: A | Discharge status: Home / Self Care | Payer: Commercial Managed Care - HMO | Source: Ambulatory Visit | Attending: Internal Medicine | Admitting: Internal Medicine

## 2022-07-22 ENCOUNTER — Ambulatory Visit: Payer: Commercial Managed Care - HMO | Admitting: Family Medicine

## 2022-07-22 VITALS — BP 104/73 | HR 79 | Temp 98.8°F | Resp 18

## 2022-07-22 DIAGNOSIS — R051 Acute cough: Secondary | ICD-10-CM

## 2022-07-22 DIAGNOSIS — M940 Chondrocostal junction syndrome [Tietze]: Secondary | ICD-10-CM

## 2022-07-22 DIAGNOSIS — J0110 Acute frontal sinusitis, unspecified: Secondary | ICD-10-CM | POA: Diagnosis not present

## 2022-07-22 MED ORDER — FLUCONAZOLE 150 MG PO TABS: 150.0000 mg | ORAL_TABLET | Freq: Every day | ORAL | 0 refills | Status: DC

## 2022-07-22 MED ORDER — IBUPROFEN 800 MG PO TABS: 800.0000 mg | ORAL_TABLET | Freq: Three times a day (TID) | ORAL | 0 refills | Status: DC

## 2022-07-22 MED ORDER — AMOXICILLIN-POT CLAVULANATE 875-125 MG PO TABS: 1.0000 | ORAL_TABLET | Freq: Two times a day (BID) | ORAL | 0 refills | Status: DC

## 2022-07-22 NOTE — Discharge Instructions (Addendum)
Watch this video who to do saline nose rinses.  [email or web address].com/videos/[email or web address]+neti+pot+video&&view=[email or web address]   Do not use tap water, only boiled water that has been cooled down.  Do it twice a day  for 5-7 days, but avoid bed time.    

## 2022-07-22 NOTE — ED Triage Notes (Addendum)
Pt states she initially felt better after 07/01/2022 visit but now she has cough which is dry, she is using flonase from time  to time but not the zyrtec.    Pt is requesting a chest xray.

## 2022-07-22 NOTE — ED Provider Notes (Signed)
[facility]    CSN: [id number] Arrival date & time: 07/22/22  1317      History   Chief Complaint Chief Complaint  Patient presents with   Cough   Nasal Congestion    HPI [name] is a 42 y.o. female who presents with recurrent non productive cough x  She was here for URI type illness at which time she had had productive green mucous  for the prior 3 weeks. At that time she was prescribed Flonase and Tessalon Perless and was given a steroid shot. The shot seemed to calm down the cough, but it never resolved. She is still having productive cough with green sputum, and denies post nasal drainage, though her nose has still been mildly congested. She has been having night sweats this week. Denies fever, but she never checked it, body aches and HA. Has not been wheezing or feeling SOB. She she gets intermittent horseness.     Past Medical History:  Diagnosis Date   Arthritis    left ankle   COVID-19 09/24/2020   flu like symptoms per pt   Endometriosis    w/abnormal uterine bleeding, Pt follows with Dr. Jackson Latino, LOV 08/27/21 as of 10/22/21.   Ganglion cyst of foot    left foot, hx of aspiration, Follows with Dr. Rayburn Go, Sports Medicine, lov 10/06/21   GERD (gastroesophageal reflux disease)    occasional with spicy foods   History of 2019 novel coronavirus disease (COVID-19)    pt tested positive 03-03-2019, results in care everywhere.  (05-22-2019 per pt had symptoms of fatigue, headache, and loss of taste/ smell;  fatigue and headache after one week and loss taste/ smell resolved after 3 wks)   Infertility, female    Ovarian cyst    Pelvic mass in female    large fluid filled pelvic mass   Recurrent boils    Septate uterus    Trichomoniasis    hx of    Patient Active Problem List   Diagnosis Date Noted   Yeast infection 04/06/2022   Sore throat 04/02/2022   Bacterial vaginosis 02/06/2022   Adhesions of adnexa of uterus due to  endometriosis 11/12/2021   Post-operative state 11/11/2021   Pelvic pain in female 11/11/2021   Pain of joint of left ankle and foot 10/06/2021   Screen for STD (sexually transmitted disease) 09/01/2021   Fatigue 09/01/2021   Abnormal uterine bleeding (AUB) 05/08/2021   Exposure to sexually transmitted disease (STD) 03/06/2021   Molluscum contagiosum 12/19/2020   Abdominal pain 08/16/2020   Constipation 08/16/2020   Frequent urination 06/24/2020   Hydradenitis 06/24/2020   Ganglion cyst of left foot 06/13/2020   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   Menorrhagia 12/25/2019   Vaginal discharge 07/08/2019   GERD (gastroesophageal reflux disease) 06/07/2019   HSV-2 seropositive 03/07/2019   Hydrosalpinx 12/30/2018   Endometriosis 05/01/2011   SEPTATE UTERUS 07/24/2009   TOBACCO ABUSE 09/26/2008   Female infertility 09/26/2008   Viral labyrinthitis 06/20/2007   Menometrorrhagia 09/21/2006    Past Surgical History:  Procedure Laterality Date   BREAST BIOPSY Right 1999   per pt benign    CYSTOSCOPY N/A 11/11/2021   Procedure: CYSTOSCOPY;  Surgeon: Willodean Rosenthal, MD;  Location: [facility];  Service: Gynecology;  Laterality: N/A;   DILITATION & CURRETTAGE/HYSTROSCOPY WITH NOVASURE ABLATION N/A 05/08/2021   Procedure: HYSTEROSCOPY, DILATATION & CURETTAGE, ENDOMETRIAL ABLATION WITH NOVASURE;  Surgeon: Willodean Rosenthal, MD;  Location: [facility];  Service: Gynecology;  Laterality:  N/A;   INCISION AND DRAINAGE     right axilla, 2019, 2020, 2022, 2023   LAPAROSCOPIC SALPINGOOPHERECTOMY Right 03-25-2010   @[facility]    and LYSIS ADHESIONS   LAPAROSCOPY N/A 05/30/2019   Procedure: DIAGNOSTIC LAPAROSCOPY;  Surgeon: Adam Phenix, MD;  Location: [facility];  Service: Gynecology;  Laterality: N/A;   ROBOTIC ASSISTED TOTAL HYSTERECTOMY N/A 11/11/2021   Procedure: XI ROBOTIC ASSISTED TOTAL HYSTERECTOMY WITH LEFT SALPINGECTOMY;  Surgeon: Willodean Rosenthal, MD;   Location: [facility];  Service: Gynecology;  Laterality: N/A;    OB History     Gravida  0   Para      Term      Preterm      AB      Living  0      SAB      IAB      Ectopic      Multiple      Live Births               Home Medications    Prior to Admission medications   Medication Sig Start Date End Date Taking? Authorizing Provider  amoxicillin-clavulanate (AUGMENTIN) 875-125 MG tablet Take 1 tablet by mouth every 12 (twelve) hours. 07/22/22  Yes Rodriguez-Southworth, Nettie Elm, PA-C  fluconazole (DIFLUCAN) 150 MG tablet Take 1 tablet (150 mg total) by mouth daily. 07/22/22  Yes Rodriguez-Southworth, Nettie Elm, PA-C  ibuprofen (ADVIL) 800 MG tablet Take 1 tablet (800 mg total) by mouth 3 (three) times daily. 07/22/22  Yes Rodriguez-Southworth, Nettie Elm, PA-C  cetirizine (ZYRTEC ALLERGY) 10 MG tablet Take 1 tablet (10 mg total) by mouth at bedtime. 07/01/22 12/28/22  Theadora Rama Scales, PA-C  fluticasone (FLONASE) 50 MCG/ACT nasal spray Place 1 spray into both nostrils daily. Begin by using 2 sprays in each nare daily for 3 to 5 days, then decrease to 1 spray in each nare daily. 07/01/22   Theadora Rama Scales, PA-C  Norethindrone Acetate-Ethinyl Estrad-FE (LOESTRIN 24 FE) 1-20 MG-MCG(24) tablet Take 1 tablet by mouth daily. 03/21/20 05/28/20  Willodean Rosenthal, MD  Norgestimate-Ethinyl Estradiol Triphasic (TRI-SPRINTEC) 0.18/0.215/0.25 MG-35 MCG tablet Take 1 tablet by mouth daily. 11/03/10 05/01/11  Adam Phenix, MD    Family History Family History  Problem Relation Age of Onset   Hypertension Mother    Diabetes Mother    Cancer Father    Hypertension Father    Diabetes Brother    Hypertension Brother    Breast cancer Neg Hx     Social History Social History   Tobacco Use   Smoking status: Every Day    Packs/day: 0.50    Years: 20.00    Additional pack years: 0.00    Total pack years: 10.00    Types: Cigarettes   Smokeless  tobacco: Never  Vaping Use   Vaping Use: Never used  Substance Use Topics   Alcohol use: Yes    Comment: occasionally on weekends   Drug use: Never     Allergies   Sulfa antibiotics   Review of Systems Review of Systems As noted in HPI  Physical Exam Triage Vital Signs ED Triage Vitals [07/22/22 1351]  Enc Vitals Group     BP 104/73     Pulse Rate 79     Resp 18     Temp 98.8 F (37.1 C)     Temp Source Oral     SpO2 98 %     Weight      Height  Head Circumference      Peak Flow      Pain Score 0     Pain Loc      Pain Edu?      Excl. in GC?    No data found.  Updated Vital Signs BP 104/73 (BP Location: Left Arm)   Pulse 79   Temp 98.8 F (37.1 C) (Oral)   Resp 18   [name] 02/23/2021 (Approximate)   SpO2 98%   Visual Acuity Right Eye Distance:   Left Eye Distance:   Bilateral Distance:    Right Eye Near:   Left Eye Near:    Bilateral Near:     Physical Exam  Physical Exam Vitals signs and nursing note reviewed.  Constitutional:      General: She is not in acute distress. She clears her throat a lot.     Appearance: Normal appearance. She is not ill-appearing, toxic-appearing or diaphoretic.  HENT:     Head: Normocephalic.     Right Ear: Tympanic membrane, ear canal and external ear normal.     Left Ear: Tympanic membrane, ear canal and external ear normal.     Nose: Nose with mild mucosa congestion. All her sinuses are tender, but L frontal has decreased transillumination, the rest are normal.     Mouth/Throat:     Mouth: Mucous membranes are moist.  Eyes:     General: No scleral icterus.       Right eye: No discharge.        Left eye: No discharge.     Conjunctiva/sclera: Conjunctivae normal.  Neck:     Musculoskeletal: Neck supple. No neck rigidity.  Cardiovascular:     Rate and Rhythm: Normal rate and regular rhythm.     Heart sounds: No murmur.  Pulmonary:     Effort: Pulmonary effort is normal.     Breath sounds: Normal breath  sounds. L mid chest wall is tender and reproduced her discomfort.  Abdominal:     General: Bowel sounds are normal. There is no distension.     Palpations: Abdomen is soft. There is no mass.     Tenderness: There is no abdominal tenderness. There is no guarding or rebound.     Hernia: No hernia is present.  Musculoskeletal: Normal range of motion.  Lymphadenopathy:     Cervical: No cervical adenopathy.  Skin:    General: Skin is warm and dry.     Coloration: Skin is not jaundiced.     Findings: No rash.  Neurological:     Mental Status: She is alert and oriented to person, place, and time.     Gait: Gait normal.  Psychiatric:        Mood and Affect: Mood normal.        Behavior: Behavior normal.        Thought Content: Thought content normal.        Judgment: Judgment normal.   UC Treatments / Results  Labs (all labs ordered are listed, but only abnormal results are displayed) Labs Reviewed - No data to display  EKG   Radiology DG Chest 2 View  Result Date: 07/22/2022 CLINICAL DATA:  Productive cough EXAM: CHEST - 2 VIEW COMPARISON:  Chest x-ray dated April 17, 2015 FINDINGS: The heart size and mediastinal contours are within normal limits. Both lungs are clear. The visualized skeletal structures are unremarkable. IMPRESSION: No active cardiopulmonary disease. Electronically Signed   By: Allegra Lai M.D.   On: 07/22/2022  15:12    Procedures Procedures (including critical care time)  Medications Ordered in UC Medications - No data to display  Initial Impression / Assessment and Plan / UC Course  I have reviewed the triage vital signs and the nursing notes.  Pertinent  imaging results that were available during my care of the patient were reviewed by me and considered in my medical decision making (see chart for details).  She was placed on Augmentin as noted, and I had her watch  a video how to do saline nose rinses. Diflucan was also prescribed as noted. See  instructions.    Final Clinical Impressions(s) / UC Diagnoses   Final diagnoses:  Acute cough  Costochondritis  Acute frontal sinusitis, recurrence not specified     Discharge Instructions      Watch this video who to do saline nose rinses.  [email or web address]   Do not use tap water, only boiled water that has been cooled down.  Do it twice a day  for 5-7 days, but avoid bed time.        ED Prescriptions     Medication Sig Dispense Auth. Provider   ibuprofen (ADVIL) 800 MG tablet Take 1 tablet (800 mg total) by mouth 3 (three) times daily. 21 tablet Rodriguez-Southworth, Nettie Elm, PA-C   amoxicillin-clavulanate (AUGMENTIN) 875-125 MG tablet Take 1 tablet by mouth every 12 (twelve) hours. 14 tablet Rodriguez-Southworth, Sunya Humbarger, PA-C   fluconazole (DIFLUCAN) 150 MG tablet Take 1 tablet (150 mg total) by mouth daily. 2 tablet Rodriguez-Southworth, Nettie Elm, PA-C      PDMP not reviewed this encounter.   Garey Ham, PA-C 07/22/22 1526

## 2022-08-03 ENCOUNTER — Ambulatory Visit (HOSPITAL_COMMUNITY)
Admission: RE | Admit: 2022-08-03 | Discharge: 2022-08-03 | Disposition: A | Discharge status: Home / Self Care | Payer: Commercial Managed Care - HMO | Source: Ambulatory Visit | Attending: Internal Medicine | Admitting: Internal Medicine

## 2022-08-03 VITALS — BP 109/68 | HR 92 | Temp 98.3°F | Resp 18

## 2022-08-03 DIAGNOSIS — L02412 Cutaneous abscess of left axilla: Secondary | ICD-10-CM

## 2022-08-03 DIAGNOSIS — L732 Hidradenitis suppurativa: Secondary | ICD-10-CM

## 2022-08-03 DIAGNOSIS — R112 Nausea with vomiting, unspecified: Secondary | ICD-10-CM | POA: Diagnosis present

## 2022-08-03 DIAGNOSIS — Z113 Encounter for screening for infections with a predominantly sexual mode of transmission: Secondary | ICD-10-CM

## 2022-08-03 LAB — POCT URINALYSIS DIP (MANUAL ENTRY)
Blood, UA: NEGATIVE
Glucose, UA: NEGATIVE mg/dL
Leukocytes, UA: NEGATIVE
Nitrite, UA: NEGATIVE
Protein Ur, POC: 100 mg/dL — AB
Spec Grav, UA: >=1.03 — AB (ref 1.010–1.025)
Urobilinogen, UA: 0.2 E.U./dL
pH, UA: 5.5 (ref 5.0–8.0)

## 2022-08-03 MED ORDER — ONDANSETRON 4 MG PO TBDP: 4.0000 mg | ORAL_TABLET | Freq: Once | ORAL | Status: DC

## 2022-08-03 MED ORDER — DOXYCYCLINE HYCLATE 100 MG PO CAPS: 100.0000 mg | ORAL_CAPSULE | Freq: Two times a day (BID) | ORAL | 0 refills | Status: AC

## 2022-08-03 MED ORDER — ONDANSETRON 4 MG PO TBDP: 4.0000 mg | ORAL_TABLET | Freq: Four times a day (QID) | ORAL | 0 refills | Status: DC | PRN

## 2022-08-03 MED ORDER — ONDANSETRON 4 MG PO TBDP
ORAL_TABLET | ORAL | Status: AC
  Filled 2022-08-03: qty 1

## 2022-08-03 MED ORDER — KETOROLAC TROMETHAMINE 30 MG/ML IJ SOLN
30.0000 mg | Freq: Once | INTRAMUSCULAR | Status: AC
  Administered 2022-08-03: 30 mg via INTRAMUSCULAR

## 2022-08-03 MED ORDER — KETOROLAC TROMETHAMINE 30 MG/ML IJ SOLN
INTRAMUSCULAR | Status: AC
  Filled 2022-08-03: qty 1

## 2022-08-03 NOTE — ED Provider Notes (Signed)
[facility]    CSN: [id number] Arrival date & time: 08/03/22  1442     History   Chief Complaint Chief Complaint  Patient presents with   Vaginal Discharge    Need to be check for food poisoning been throwing up stomach aches bad,and also  Need a STD screening as well - Entered by patient   Abscess    HPI [name] is a 42 y.o. female.  Here with several concerns  First she would like STD testing. Not having any symptoms and no known exposures. S/p hysterectomy Just had HIV/RPR testing done 3 weeks ago Was treated for yeast.  Also was having nausea and vomiting. No vomiting since 2 nights ago. Abdominal discomfort and cramping. Not having urinary symptoms. No fever/chills. No diarrhea, normal BM this morning. Tolerating fluids by mouth, no appetite. Took a zofran this morning that helped.   Abscess under her left arm x 3 days. Increased pain today. Not draining. She was using leftover amoxicillin for the last 2 days. History of HS  Past Medical History:  Diagnosis Date   Arthritis    left ankle   COVID-19 09/24/2020   flu like symptoms per pt   Endometriosis    w/abnormal uterine bleeding, Pt follows with Dr. Jackson Latino, LOV 08/27/21 as of 10/22/21.   Ganglion cyst of foot    left foot, hx of aspiration, Follows with Dr. Rayburn Go, Sports Medicine, lov 10/06/21   GERD (gastroesophageal reflux disease)    occasional with spicy foods   History of 2019 novel coronavirus disease (COVID-19)    pt tested positive 03-03-2019, results in care everywhere.  (05-22-2019 per pt had symptoms of fatigue, headache, and loss of taste/ smell;  fatigue and headache after one week and loss taste/ smell resolved after 3 wks)   Infertility, female    Ovarian cyst    Pelvic mass in female    large fluid filled pelvic mass   Recurrent boils    Septate uterus    Trichomoniasis    hx of    Patient Active Problem List   Diagnosis Date Noted   Yeast infection  04/06/2022   Sore throat 04/02/2022   Bacterial vaginosis 02/06/2022   Adhesions of adnexa of uterus due to endometriosis 11/12/2021   Post-operative state 11/11/2021   Pelvic pain in female 11/11/2021   Pain of joint of left ankle and foot 10/06/2021   Screen for STD (sexually transmitted disease) 09/01/2021   Fatigue 09/01/2021   Abnormal uterine bleeding (AUB) 05/08/2021   Exposure to sexually transmitted disease (STD) 03/06/2021   Molluscum contagiosum 12/19/2020   Abdominal pain 08/16/2020   Constipation 08/16/2020   Frequent urination 06/24/2020   Hydradenitis 06/24/2020   Ganglion cyst of left foot 06/13/2020   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   Menorrhagia 12/25/2019   Vaginal discharge 07/08/2019   GERD (gastroesophageal reflux disease) 06/07/2019   HSV-2 seropositive 03/07/2019   Hydrosalpinx 12/30/2018   Endometriosis 05/01/2011   SEPTATE UTERUS 07/24/2009   TOBACCO ABUSE 09/26/2008   Female infertility 09/26/2008   Viral labyrinthitis 06/20/2007   Menometrorrhagia 09/21/2006    Past Surgical History:  Procedure Laterality Date   BREAST BIOPSY Right 1999   per pt benign    CYSTOSCOPY N/A 11/11/2021   Procedure: CYSTOSCOPY;  Surgeon: Willodean Rosenthal, MD;  Location: [facility];  Service: Gynecology;  Laterality: N/A;   DILITATION & CURRETTAGE/HYSTROSCOPY WITH NOVASURE ABLATION N/A 05/08/2021   Procedure: HYSTEROSCOPY, DILATATION & CURETTAGE, ENDOMETRIAL ABLATION  WITH NOVASURE;  Surgeon: Willodean Rosenthal, MD;  Location: [facility];  Service: Gynecology;  Laterality: N/A;   INCISION AND DRAINAGE     right axilla, 2019, 2020, 2022, 2023   LAPAROSCOPIC SALPINGOOPHERECTOMY Right 03-25-2010   @[facility]    and LYSIS ADHESIONS   LAPAROSCOPY N/A 05/30/2019   Procedure: DIAGNOSTIC LAPAROSCOPY;  Surgeon: Adam Phenix, MD;  Location: [facility];  Service: Gynecology;  Laterality: N/A;   ROBOTIC ASSISTED TOTAL HYSTERECTOMY N/A 11/11/2021    Procedure: XI ROBOTIC ASSISTED TOTAL HYSTERECTOMY WITH LEFT SALPINGECTOMY;  Surgeon: Willodean Rosenthal, MD;  Location: [facility];  Service: Gynecology;  Laterality: N/A;    OB History     Gravida  0   Para      Term      Preterm      AB      Living  0      SAB      IAB      Ectopic      Multiple      Live Births               Home Medications    Prior to Admission medications   Medication Sig Start Date End Date Taking? Authorizing Provider  doxycycline (VIBRAMYCIN) 100 MG capsule Take 1 capsule (100 mg total) by mouth every 12 (twelve) hours for 5 days. 08/03/22 08/08/22 Yes Anshika Pethtel, Lurena Joiner, PA-C  ondansetron (ZOFRAN-ODT) 4 MG disintegrating tablet Take 1 tablet (4 mg total) by mouth every 6 (six) hours as needed for nausea or vomiting. 08/03/22  Yes Jaliya Siegmann, Lurena Joiner, PA-C  cetirizine (ZYRTEC ALLERGY) 10 MG tablet Take 1 tablet (10 mg total) by mouth at bedtime. 07/01/22 12/28/22  Theadora Rama Scales, PA-C  fluconazole (DIFLUCAN) 150 MG tablet Take 1 tablet (150 mg total) by mouth daily. 07/22/22   Rodriguez-Southworth, Nettie Elm, PA-C  fluticasone (FLONASE) 50 MCG/ACT nasal spray Place 1 spray into both nostrils daily. Begin by using 2 sprays in each nare daily for 3 to 5 days, then decrease to 1 spray in each nare daily. 07/01/22   Theadora Rama Scales, PA-C  ibuprofen (ADVIL) 800 MG tablet Take 1 tablet (800 mg total) by mouth 3 (three) times daily. 07/22/22   Rodriguez-Southworth, Nettie Elm, PA-C  Norethindrone Acetate-Ethinyl Estrad-FE (LOESTRIN 24 FE) 1-20 MG-MCG(24) tablet Take 1 tablet by mouth daily. 03/21/20 05/28/20  Willodean Rosenthal, MD  Norgestimate-Ethinyl Estradiol Triphasic (TRI-SPRINTEC) 0.18/0.215/0.25 MG-35 MCG tablet Take 1 tablet by mouth daily. 11/03/10 05/01/11  Adam Phenix, MD    Family History Family History  Problem Relation Age of Onset   Hypertension Mother    Diabetes Mother    Cancer Father    Hypertension  Father    Diabetes Brother    Hypertension Brother    Breast cancer Neg Hx     Social History Social History   Tobacco Use   Smoking status: Every Day    Packs/day: 0.50    Years: 20.00    Additional pack years: 0.00    Total pack years: 10.00    Types: Cigarettes   Smokeless tobacco: Never  Vaping Use   Vaping Use: Never used  Substance Use Topics   Alcohol use: Yes    Comment: occasionally on weekends   Drug use: Never     Allergies   Sulfa antibiotics   Review of Systems Review of Systems As per HPI  Physical Exam Triage Vital Signs ED Triage Vitals [08/03/22 1509]  Enc Vitals Group  BP 109/68     Pulse Rate 92     Resp 18     Temp 98.3 F (36.8 C)     Temp Source Oral     SpO2 92 %     Weight      Height      Head Circumference      Peak Flow      Pain Score      Pain Loc      Pain Edu?      Excl. in GC?    No data found.  Updated Vital Signs BP 109/68 (BP Location: Left Arm)   Pulse 92   Temp 98.3 F (36.8 C) (Oral)   Resp 18   [name] 02/23/2021 (Approximate)   SpO2 92%   Visual Acuity Right Eye Distance:   Left Eye Distance:   Bilateral Distance:    Right Eye Near:   Left Eye Near:    Bilateral Near:     Physical Exam Vitals and nursing note reviewed.  Constitutional:      General: She is not in acute distress.    Appearance: She is not ill-appearing or diaphoretic.  HENT:     Nose: No rhinorrhea.     Mouth/Throat:     Mouth: Mucous membranes are moist.     Pharynx: Oropharynx is clear. No posterior oropharyngeal erythema.  Eyes:     Conjunctiva/sclera: Conjunctivae normal.  Cardiovascular:     Rate and Rhythm: Normal rate and regular rhythm.     Heart sounds: Normal heart sounds.  Pulmonary:     Effort: Pulmonary effort is normal.     Breath sounds: Normal breath sounds.  Abdominal:     General: There is no distension.     Palpations: Abdomen is soft.     Tenderness: There is no abdominal tenderness. There is no  right CVA tenderness, left CVA tenderness, guarding or rebound.  Musculoskeletal:     Cervical back: Normal range of motion.  Lymphadenopathy:     Cervical: No cervical adenopathy.  Skin:    General: Skin is warm and dry.     Findings: Abscess present.     Comments: Warm, erythematous, indurated abscess in left axilla.  Exquisitely tender to palpation.  Limited exam, does not seem to be fluctuant.  There is no drainage  Neurological:     Mental Status: She is alert and oriented to person, place, and time.  Psychiatric:        Mood and Affect: Affect is tearful.     UC Treatments / Results  Labs (all labs ordered are listed, but only abnormal results are displayed) Labs Reviewed  POCT URINALYSIS DIP (MANUAL ENTRY) - Abnormal; Notable for the following components:      Result Value   Color, UA red (*)    Clarity, UA cloudy (*)    Bilirubin, UA small (*)    Ketones, POC UA trace (5) (*)    Spec Grav, UA >=1.030 (*)    Protein Ur, POC =100 (*)    All other components within normal limits  CERVICOVAGINAL ANCILLARY ONLY    EKG   Radiology No results found.  Procedures Procedures (including critical care time)  Medications Ordered in UC Medications  ketorolac (TORADOL) 30 MG/ML injection 30 mg (30 mg Intramuscular Given 08/03/22 1553)    Initial Impression / Assessment and Plan / UC Course  I have reviewed the triage vital signs and the nursing notes.  Pertinent labs & imaging  results that were available during my care of the patient were reviewed by me and considered in my medical decision making (see chart for details).  1) STD screening. Cytology swab is pending. Treat positive result if indicated.   2) Nausea and vomiting. Afebrile and well appearing. She took her own zofran in clinic. Able to tolerate fluids. Advised bland diet, increase fluids, zofran q6 hours prn. Unknown etiology but reassuring no vomiting in last 2 days. Works with kids, could be stomach bug vs  related to abscess UA elevated specific gravity. No blood, leuks, or nitrates.  3) Left axillary abscess. Normal kidney function. Toradol IM given in clinic for pain. Unable to be drained today. Doxy BID x 5 days. Advised to take with food and zofran. Wear sunscreen and protective clothing. Follow with PCP  All questions answered, return and ED precautions discussed.  Final Clinical Impressions(s) / UC Diagnoses   Final diagnoses:  Nausea and vomiting, unspecified vomiting type  Abscess of left axilla  Hydradenitis  Screen for STD (sexually transmitted disease)     Discharge Instructions      The Toradol injection should provide good pain relief. It can take 30 minutes to start working  Please take the antibiotic (Doxycycline) as prescribed. Take with food to avoid upset stomach. Unfortunately the antibiotic might worsen your nausea.  I recommend to use the zofran about 30 minutes before eating and taking the antibiotic. This will help keep the stomach settled. Drink LOTS of fluids.  Please wear sunscreen and protective clothing if you are going to be outdoors, as antibiotics can increase sensitivity to sun.  We will call you if anything on your swab returns positive. You can also see these results on MyChart. Please abstain from sexual intercourse until your results return.  If your symptoms worsen please go directly to the emergency department.  Otherwise please call your primary care provider to make an appointment for follow up.      ED Prescriptions     Medication Sig Dispense Auth. Provider   doxycycline (VIBRAMYCIN) 100 MG capsule Take 1 capsule (100 mg total) by mouth every 12 (twelve) hours for 5 days. 10 capsule Jessicaann Overbaugh, PA-C   ondansetron (ZOFRAN-ODT) 4 MG disintegrating tablet Take 1 tablet (4 mg total) by mouth every 6 (six) hours as needed for nausea or vomiting. 20 tablet Tessi Eustache, Lurena Joiner, PA-C      PDMP not reviewed this encounter.   Marlow Baars, [place] 08/03/22 [id number]

## 2022-08-03 NOTE — Discharge Instructions (Addendum)
The Toradol injection should provide good pain relief. It can take 30 minutes to start working  Please take the antibiotic (Doxycycline) as prescribed. Take with food to avoid upset stomach. Unfortunately the antibiotic might worsen your nausea.  I recommend to use the zofran about 30 minutes before eating and taking the antibiotic. This will help keep the stomach settled. Drink LOTS of fluids.  Please wear sunscreen and protective clothing if you are going to be outdoors, as antibiotics can increase sensitivity to sun.  We will call you if anything on your swab returns positive. You can also see these results on MyChart. Please abstain from sexual intercourse until your results return.  If your symptoms worsen please go directly to the emergency department.  Otherwise please call your primary care provider to make an appointment for follow up.

## 2022-08-03 NOTE — ED Triage Notes (Signed)
Here with several complaints.  Pt has nausea x 2 days. Pt reports lower abdominal pain. Pt has a boil under her right arm that is draining. She has been taking Amoxicillin, but was prescribed Doxycycline but did not start the medication.

## 2022-08-03 NOTE — ED Triage Notes (Signed)
Also, pt would like to be tested for STI.

## 2022-08-04 LAB — CERVICOVAGINAL ANCILLARY ONLY
Bacterial Vaginitis (gardnerella): POSITIVE — AB
Candida Glabrata: NEGATIVE
Candida Vaginitis: NEGATIVE
Chlamydia: NEGATIVE
Comment: NEGATIVE
Comment: NEGATIVE
Comment: NEGATIVE
Comment: NEGATIVE
Comment: NEGATIVE
Comment: NORMAL
Neisseria Gonorrhea: NEGATIVE
Trichomonas: NEGATIVE

## 2022-08-05 ENCOUNTER — Telehealth (HOSPITAL_COMMUNITY): Payer: Self-pay | Admitting: Emergency Medicine

## 2022-08-05 MED ORDER — METRONIDAZOLE 0.75 % VA GEL: 1.0000 | Freq: Every day | VAGINAL | 0 refills | Status: AC

## 2022-08-20 ENCOUNTER — Other Ambulatory Visit (HOSPITAL_COMMUNITY)
Admission: RE | Admit: 2022-08-20 | Discharge: 2022-08-20 | Disposition: A | Discharge status: Home / Self Care | Payer: Commercial Managed Care - HMO | Source: Ambulatory Visit

## 2022-08-20 ENCOUNTER — Encounter: Payer: Self-pay | Admitting: Family Medicine

## 2022-08-20 ENCOUNTER — Ambulatory Visit (INDEPENDENT_AMBULATORY_CARE_PROVIDER_SITE_OTHER): Payer: Commercial Managed Care - HMO | Admitting: Family Medicine

## 2022-08-20 ENCOUNTER — Other Ambulatory Visit: Payer: Self-pay

## 2022-08-20 VITALS — BP 112/79 | HR 80 | Ht 62.0 in | Wt 175.4 lb

## 2022-08-20 DIAGNOSIS — N898 Other specified noninflammatory disorders of vagina: Secondary | ICD-10-CM | POA: Diagnosis present

## 2022-08-20 NOTE — Patient Instructions (Signed)
Thank you for coming in today!  Things we discussed today: We tested for STIs and other causes of vaginal itching today. Once we have the results, I will send in the appropriate medication.   Please make an appointment to see me as needed  Have a great day!

## 2022-08-20 NOTE — Assessment & Plan Note (Signed)
Based on symptoms and appearance of vaginal discharge, likely candida vaginitis. Will order medication pending lab results

## 2022-08-20 NOTE — Progress Notes (Signed)
    SUBJECTIVE:   CHIEF COMPLAINT / HPI:   Vaginal itching/burning -4 days of symptoms -took 3 days worth of metronidazole gel for recent BV infection (last week) -discharge looks clumpy, thick, and white -was on doxycycline for 2 weeks for arm abscess -no fevers -some abdominal cramping earlier this week  PERTINENT  PMH / PSH: reviewed  OBJECTIVE:   BP 112/79   Pulse 80   Ht 5\' 2"  (1.575 m)   Wt 175 lb 6.4 oz (79.6 kg)   [name] 02/23/2021 (Approximate)   SpO2 100%   BMI 32.08 kg/m     Physical Exam Exam conducted with a chaperone present Dorann Lodge Ottley, CMA).  Genitourinary:    General: Normal vulva.     Comments: Scant thick, white discharge present at external cervical os    ASSESSMENT/PLAN:   Vaginal itching Based on symptoms and appearance of vaginal discharge, likely candida vaginitis. Will order medication pending lab results    Patient Instructions  Thank you for coming in today!  Things we discussed today: We tested for STIs and other causes of vaginal itching today. Once we have the results, I will send in the appropriate medication.   Please make an appointment to see me as needed  Have a great day!  Lorayne Bender, MD [facility]

## 2022-08-21 LAB — CERVICOVAGINAL ANCILLARY ONLY
Bacterial Vaginitis (gardnerella): NEGATIVE
Candida Glabrata: NEGATIVE
Candida Vaginitis: NEGATIVE
Chlamydia: NEGATIVE
Comment: NEGATIVE
Comment: NEGATIVE
Comment: NEGATIVE
Comment: NEGATIVE
Comment: NEGATIVE
Comment: NORMAL
Neisseria Gonorrhea: NEGATIVE
Trichomonas: NEGATIVE

## 2022-08-22 ENCOUNTER — Encounter: Payer: Self-pay | Admitting: Student

## 2022-08-22 DIAGNOSIS — B3731 Acute candidiasis of vulva and vagina: Secondary | ICD-10-CM

## 2022-08-24 MED ORDER — FLUCONAZOLE 150 MG PO TABS: 150.0000 mg | ORAL_TABLET | Freq: Every day | ORAL | 0 refills | Status: DC

## 2022-08-24 NOTE — Telephone Encounter (Signed)
Called patient to discuss ongoing vaginal itching and discharge despite negative cervical swab results. Patient had a hysterectomy in 2023 so no risk of pregnancy. Will send fluconazole for empiric treatment. Instructed patient to return to clinic if symptoms continue after treatment.

## 2022-09-04 ENCOUNTER — Ambulatory Visit: Payer: Commercial Managed Care - HMO

## 2022-09-07 ENCOUNTER — Ambulatory Visit (INDEPENDENT_AMBULATORY_CARE_PROVIDER_SITE_OTHER): Payer: Commercial Managed Care - HMO | Admitting: Student

## 2022-09-07 ENCOUNTER — Ambulatory Visit (HOSPITAL_COMMUNITY): Payer: Self-pay

## 2022-09-07 ENCOUNTER — Other Ambulatory Visit (HOSPITAL_COMMUNITY)
Admission: RE | Admit: 2022-09-07 | Discharge: 2022-09-07 | Disposition: A | Discharge status: Home / Self Care | Payer: Commercial Managed Care - HMO | Source: Ambulatory Visit | Attending: Family Medicine | Admitting: Family Medicine

## 2022-09-07 VITALS — BP 120/88 | HR 77 | Ht 62.0 in | Wt 175.6 lb

## 2022-09-07 DIAGNOSIS — N898 Other specified noninflammatory disorders of vagina: Secondary | ICD-10-CM | POA: Diagnosis present

## 2022-09-07 LAB — POCT WET PREP (WET MOUNT)
Clue Cells Wet Prep Whiff POC: NEGATIVE
Trichomonas Wet Prep HPF POC: ABSENT
WBC, Wet Prep HPF POC: NONE SEEN

## 2022-09-07 NOTE — Assessment & Plan Note (Signed)
Wet prep repeated today which resulted negative for yeast, BV and trich. Attempted to call patient with results. Will await GC swab testing for further communication. If everything results negative can offer topical clotrimazole for symptom management .

## 2022-09-07 NOTE — Patient Instructions (Signed)
It was great to see you today!   Today we addressed: Vaginal itchiness. I am sending swabs to be checked for yeast, BV, trichomonas, chlamydia and gonorrhea. When I have results back I will call you.   Future Appointments  Date Time Provider Department Center  09/07/2022  4:10 PM Glendale Chard, DO [facility]    Please arrive 15 minutes before your appointment to ensure smooth check in process.    Please call the clinic at [phone] if your symptoms worsen or you have any concerns.  Thank you for allowing me to participate in your care, Dr. Glendale Chard [facility]

## 2022-09-07 NOTE — Progress Notes (Signed)
    SUBJECTIVE:   CHIEF COMPLAINT / HPI:   [name] is a 42 y.o. female  presenting for follow up of vaginal itching and discharge.  She reports having an appointment 2 weeks ago where they did a pelvic exam and swab which was negative for acute infections.  She however felt like this was not done appropriately and is coming back for repeat as her symptoms are still present.  She request to be tested for gonorrhea and chlamydia along with a wet prep.  She reports her discharge is white and chunky with associated vaginal irritation and burning.  PERTINENT  PMH / PSH: Reviewed and updated   OBJECTIVE:   BP 120/88   Pulse 77   Ht 5\' 2"  (1.575 m)   Wt 175 lb 9.6 oz (79.7 kg)   [name] 02/23/2021 (Approximate)   SpO2 100%   BMI 32.12 kg/m   Well-appearing, no acute distress Cardio: Regular rate, regular rhythm, no murmurs on exam. Pulm: Clear, no wheezing, no crackles. No increased work of breathing Abdominal: bowel sounds present, soft, non-tender, non-distended Extremities: no peripheral edema   Pelvic Exam: MA chaperone present  Normal external genitalia White thick discharge noted No cervical motion tenderness  Cervix visualized with no lesions    ASSESSMENT/PLAN:   Vaginal itching Wet prep repeated today which resulted negative for yeast, BV and trich. Attempted to call patient with results. Will await GC swab testing for further communication. If everything results negative can offer topical clotrimazole for symptom management .      Glendale Chard, DO [facility]

## 2022-09-09 ENCOUNTER — Encounter: Payer: Self-pay | Admitting: Student

## 2022-09-09 ENCOUNTER — Telehealth: Payer: Self-pay | Admitting: Student

## 2022-09-09 NOTE — Telephone Encounter (Signed)
Called and left HIPAA compliant voicemail about lb results. Her wet prep and GC was negative for active infection meaning that her vaginal itching may just be from irritation. Per my last note, we can try an anti-fungal cream to see if that helps with her vaginal itching.  Glendale Chard, DO [facility], PGY-2 09/09/22 12:15 PM

## 2022-09-10 MED ORDER — TOPI-CLICK VAGINAL APPLICATOR MISC: 1.0000 | Freq: Every day | 2 refills | Status: DC

## 2022-09-10 MED ORDER — METRONIDAZOLE 0.75 % EX GEL: 1.0000 | Freq: Every day | CUTANEOUS | 0 refills | Status: DC

## 2022-09-13 ENCOUNTER — Encounter: Payer: Self-pay | Admitting: Student

## 2022-09-13 DIAGNOSIS — K824 Cholesterolosis of gallbladder: Secondary | ICD-10-CM

## 2022-09-25 ENCOUNTER — Ambulatory Visit
Admission: RE | Admit: 2022-09-25 | Discharge: 2022-09-25 | Disposition: A | Discharge status: Home / Self Care | Payer: Commercial Managed Care - HMO | Source: Ambulatory Visit | Attending: Family Medicine | Admitting: Family Medicine

## 2022-09-25 DIAGNOSIS — K824 Cholesterolosis of gallbladder: Secondary | ICD-10-CM

## 2022-09-29 ENCOUNTER — Ambulatory Visit (HOSPITAL_COMMUNITY)
Admission: RE | Admit: 2022-09-29 | Discharge: 2022-09-29 | Disposition: A | Discharge status: Home / Self Care | Payer: Commercial Managed Care - HMO | Source: Ambulatory Visit | Attending: Physician Assistant | Admitting: Physician Assistant

## 2022-09-29 ENCOUNTER — Encounter (HOSPITAL_COMMUNITY): Payer: Self-pay

## 2022-09-29 ENCOUNTER — Ambulatory Visit (HOSPITAL_COMMUNITY): Payer: Self-pay

## 2022-09-29 VITALS — BP 115/75 | HR 110 | Temp 98.9°F | Resp 18

## 2022-09-29 DIAGNOSIS — N898 Other specified noninflammatory disorders of vagina: Secondary | ICD-10-CM | POA: Diagnosis not present

## 2022-09-29 DIAGNOSIS — Z113 Encounter for screening for infections with a predominantly sexual mode of transmission: Secondary | ICD-10-CM | POA: Insufficient documentation

## 2022-09-29 DIAGNOSIS — R000 Tachycardia, unspecified: Secondary | ICD-10-CM | POA: Insufficient documentation

## 2022-09-29 LAB — HIV ANTIBODY (ROUTINE TESTING W REFLEX): HIV Screen 4th Generation wRfx: NONREACTIVE

## 2022-09-29 MED ORDER — METRONIDAZOLE 500 MG PO TABS: 500.0000 mg | ORAL_TABLET | Freq: Two times a day (BID) | ORAL | 0 refills | Status: DC

## 2022-09-29 NOTE — ED Triage Notes (Signed)
Pt states she would like all STI screenings, she has been having some vaginal discharge and change in smell x 3-4 days.

## 2022-09-29 NOTE — ED Provider Notes (Signed)
[facility]    CSN: [id number] Arrival date & time: 09/29/22  1112      History   Chief Complaint Chief Complaint  Patient presents with   SEXUALLY TRANSMITTED DISEASE   Vaginal Discharge    HPI [name] is a 42 y.o. female.   Patient presents today with a 3 to 4-day history of vaginal discharge.  She describes this as slightly malodorous.  She does have a history of recurrent BV and has been treated monthly for the past several months.  She does not currently follow with an OB/GYN.  She is interested in complete STI panel.  She does report some vaginal discomfort but denies any pelvic pain, abdominal pain, fever, nausea, vomiting, urinary symptoms.  She is sexually active with female partners.  She declined to answer whether she uses condoms regularly.  She is no specific concerns but is interested in complete panel.  No concern for pregnancy as she is status post hysterectomy.  She did have negative hepatitis C testing several years ago but is interested in HIV and syphilis testing today.    Past Medical History:  Diagnosis Date   Arthritis    left ankle   COVID-19 09/24/2020   flu like symptoms per pt   Endometriosis    w/abnormal uterine bleeding, Pt follows with Dr. Jackson Latino, LOV 08/27/21 as of 10/22/21.   Ganglion cyst of foot    left foot, hx of aspiration, Follows with Dr. Rayburn Go, Sports Medicine, lov 10/06/21   GERD (gastroesophageal reflux disease)    occasional with spicy foods   History of 2019 novel coronavirus disease (COVID-19)    pt tested positive 03-03-2019, results in care everywhere.  (05-22-2019 per pt had symptoms of fatigue, headache, and loss of taste/ smell;  fatigue and headache after one week and loss taste/ smell resolved after 3 wks)   Infertility, female    Ovarian cyst    Pelvic mass in female    large fluid filled pelvic mass   Recurrent boils    Septate uterus    Trichomoniasis    hx of    Patient Active  Problem List   Diagnosis Date Noted   Yeast infection 04/06/2022   Sore throat 04/02/2022   Bacterial vaginosis 02/06/2022   Adhesions of adnexa of uterus due to endometriosis 11/12/2021   Pelvic pain in female 11/11/2021   Pain of joint of left ankle and foot 10/06/2021   Screen for STD (sexually transmitted disease) 09/01/2021   Fatigue 09/01/2021   Abnormal uterine bleeding (AUB) 05/08/2021   Exposure to sexually transmitted disease (STD) 03/06/2021   Molluscum contagiosum 12/19/2020   Abdominal pain 08/16/2020   Constipation 08/16/2020   Frequent urination 06/24/2020   Hydradenitis 06/24/2020   Ganglion cyst of left foot 06/13/2020   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   Menorrhagia 12/25/2019   Vaginal itching 07/08/2019   GERD (gastroesophageal reflux disease) 06/07/2019   HSV-2 seropositive 03/07/2019   Hydrosalpinx 12/30/2018   Endometriosis 05/01/2011   SEPTATE UTERUS 07/24/2009   TOBACCO ABUSE 09/26/2008   Female infertility 09/26/2008   Viral labyrinthitis 06/20/2007   Menometrorrhagia 09/21/2006    Past Surgical History:  Procedure Laterality Date   BREAST BIOPSY Right 1999   per pt benign    CYSTOSCOPY N/A 11/11/2021   Procedure: CYSTOSCOPY;  Surgeon: Willodean Rosenthal, MD;  Location: [facility] [place];  Service: Gynecology;  Laterality: N/A;   DILITATION & CURRETTAGE/HYSTROSCOPY WITH NOVASURE ABLATION N/A 05/08/2021   Procedure: HYSTEROSCOPY,  DILATATION & CURETTAGE, ENDOMETRIAL ABLATION WITH NOVASURE;  Surgeon: Willodean Rosenthal, MD;  Location: [facility];  Service: Gynecology;  Laterality: N/A;   INCISION AND DRAINAGE     right axilla, 2019, 2020, 2022, 2023   LAPAROSCOPIC SALPINGOOPHERECTOMY Right 03-25-2010   @[facility]    and LYSIS ADHESIONS   LAPAROSCOPY N/A 05/30/2019   Procedure: DIAGNOSTIC LAPAROSCOPY;  Surgeon: Adam Phenix, MD;  Location: [facility];  Service: Gynecology;  Laterality: N/A;   ROBOTIC ASSISTED TOTAL HYSTERECTOMY N/A  11/11/2021   Procedure: XI ROBOTIC ASSISTED TOTAL HYSTERECTOMY WITH LEFT SALPINGECTOMY;  Surgeon: Willodean Rosenthal, MD;  Location: [facility] [place];  Service: Gynecology;  Laterality: N/A;    OB History     Gravida  0   Para      Term      Preterm      AB      Living  0      SAB      IAB      Ectopic      Multiple      Live Births               Home Medications    Prior to Admission medications   Medication Sig Start Date End Date Taking? Authorizing Provider  metroNIDAZOLE (FLAGYL) 500 MG tablet Take 1 tablet (500 mg total) by mouth 2 (two) times daily. 09/29/22  Yes Jamonte Curfman K, PA-C  Norethindrone Acetate-Ethinyl Estrad-FE (LOESTRIN 24 FE) 1-20 MG-MCG(24) tablet Take 1 tablet by mouth daily. 03/21/20 05/28/20  Willodean Rosenthal, MD  Norgestimate-Ethinyl Estradiol Triphasic (TRI-SPRINTEC) 0.18/0.215/0.25 MG-35 MCG tablet Take 1 tablet by mouth daily. 11/03/10 05/01/11  Adam Phenix, MD    Family History Family History  Problem Relation Age of Onset   Hypertension Mother    Diabetes Mother    Cancer Father    Hypertension Father    Diabetes Brother    Hypertension Brother    Breast cancer Neg Hx     Social History Social History   Tobacco Use   Smoking status: Every Day    Current packs/day: 0.50    Average packs/day: 0.5 packs/day for 20.0 years (10.0 ttl pk-yrs)    Types: Cigarettes   Smokeless tobacco: Never  Vaping Use   Vaping status: Never Used  Substance Use Topics   Alcohol use: Yes    Comment: occasionally on weekends   Drug use: Never     Allergies   Sulfa antibiotics   Review of Systems Review of Systems  Constitutional:  Positive for activity change. Negative for appetite change, fatigue and fever.  Gastrointestinal:  Negative for abdominal pain, diarrhea, nausea and vomiting.  Genitourinary:  Positive for vaginal discharge and vaginal pain. Negative for dysuria, frequency, pelvic pain, urgency and  vaginal bleeding.     Physical Exam Triage Vital Signs ED Triage Vitals  Encounter Vitals Group     BP 09/29/22 1121 115/75     Systolic BP Percentile --      Diastolic BP Percentile --      Pulse Rate 09/29/22 1121 (!) 113     Resp 09/29/22 1121 18     Temp 09/29/22 1121 98.9 F (37.2 C)     Temp Source 09/29/22 1121 Oral     SpO2 09/29/22 1121 95 %     Weight --      Height --      Head Circumference --      Peak Flow --  Pain Score 09/29/22 1120 0     Pain Loc --      Pain Education --      Exclude from Growth Chart --    No data found.  Updated Vital Signs BP 115/75 (BP Location: Right Arm)   Pulse (!) 110   Temp 98.9 F (37.2 C) (Oral)   Resp 18   [name] 02/23/2021 (Approximate)   SpO2 95%   Visual Acuity Right Eye Distance:   Left Eye Distance:   Bilateral Distance:    Right Eye Near:   Left Eye Near:    Bilateral Near:     Physical Exam Vitals reviewed.  Constitutional:      General: She is awake. She is not in acute distress.    Appearance: Normal appearance. She is well-developed. She is not ill-appearing.     Comments: Appears stated age in no acute distress sitting comfortably in exam room  HENT:     Head: Normocephalic and atraumatic.  Cardiovascular:     Rate and Rhythm: Regular rhythm. Tachycardia present.     Heart sounds: Normal heart sounds, S1 normal and S2 normal. No murmur heard. Pulmonary:     Effort: Pulmonary effort is normal.     Breath sounds: Normal breath sounds. No wheezing, rhonchi or rales.     Comments: Clear to auscultation bilaterally Abdominal:     General: Bowel sounds are normal.     Palpations: Abdomen is soft.     Tenderness: There is abdominal tenderness in the epigastric area and periumbilical area. There is no right CVA tenderness, left CVA tenderness, guarding or rebound.     Comments: Mild tenderness palpation in periumbilical region and epigastrium without evidence of acute abdomen on physical exam.   Psychiatric:        Behavior: Behavior is cooperative.      UC Treatments / Results  Labs (all labs ordered are listed, but only abnormal results are displayed) Labs Reviewed  RPR  HIV ANTIBODY (ROUTINE TESTING W REFLEX)  CERVICOVAGINAL ANCILLARY ONLY    EKG   Radiology No results found.  Procedures Procedures (including critical care time)  Medications Ordered in UC Medications - No data to display  Initial Impression / Assessment and Plan / UC Course  I have reviewed the triage vital signs and the nursing notes.  Pertinent labs & imaging results that were available during my care of the patient were reviewed by me and considered in my medical decision making (see chart for details).     STI swab was collected and is pending.  Will empirically treat for bacterial vaginosis given her clinical presentation.  Metronidazole sent to pharmacy and she was instructed to avoid alcohol while on this medication due to associated Antabuse side effects.  We will contact her if any to arrange any additional treatment based on her swab results.  HIV and syphilis testing was obtained and is pending.  Encourage patient to follow-up with OB/GYN given her recurrent BV to consider maintenance treatment with boric acid or other medication and she was given contact information for local provider with instruction to call to schedule an appointment as needed.  Strict return precautions given.  Patient was mildly tachycardic in clinic.  This was rechecked during visit and remained stable at approximately 110 bpm.  Patient reports that she is very upset and just got to an argument with someone and believes this is the reason for her elevated heart rate.  She denies any chest pain, shortness  of breath, lightheadedness, palpitations.  Encouraged her to monitor this at home if it remains elevated she should be reevaluated.  Discussed that if she has any worsening symptoms she should be seen  immediately.  Final Clinical Impressions(s) / UC Diagnoses   Final diagnoses:  Vaginal discharge  Routine screening for STI (sexually transmitted infection)  Tachycardia     Discharge Instructions      We are treating you for bacterial vaginosis.  Please take metronidazole twice daily for 7 days.  Do not drink any alcohol while on this medication for 3 days after completing course as it can cause you to vomit.  We will contact you if any of your other testing is positive.  Please monitor your MyChart for results.  If you develop any symptoms please return for reevaluation.  Your heart rate was slightly elevated.  Please monitor this at home if it remains above 100 bpm please return for reevaluation.  If anything worsens and you have heart racing sensation, chest pain, shortness of breath, lightheadedness or feel like you are going to pass out you need to be seen immediately.    ED Prescriptions     Medication Sig Dispense Auth. Provider   metroNIDAZOLE (FLAGYL) 500 MG tablet Take 1 tablet (500 mg total) by mouth 2 (two) times daily. 14 tablet Markee Matera, Noberto Retort, PA-C      PDMP not reviewed this encounter.   Jeani Hawking, PA-C 09/29/22 1140

## 2022-09-29 NOTE — Discharge Instructions (Addendum)
We are treating you for bacterial vaginosis.  Please take metronidazole twice daily for 7 days.  Do not drink any alcohol while on this medication for 3 days after completing course as it can cause you to vomit.  We will contact you if any of your other testing is positive.  Please monitor your MyChart for results.  If you develop any symptoms please return for reevaluation.  Your heart rate was slightly elevated.  Please monitor this at home if it remains above 100 bpm please return for reevaluation.  If anything worsens and you have heart racing sensation, chest pain, shortness of breath, lightheadedness or feel like you are going to pass out you need to be seen immediately.

## 2022-09-30 LAB — RPR: RPR Ser Ql: NONREACTIVE

## 2022-10-01 LAB — CERVICOVAGINAL ANCILLARY ONLY
Bacterial Vaginitis (gardnerella): POSITIVE — AB
Candida Glabrata: NEGATIVE
Candida Vaginitis: NEGATIVE
Chlamydia: NEGATIVE
Comment: NEGATIVE
Comment: NEGATIVE
Comment: NEGATIVE
Comment: NEGATIVE
Comment: NEGATIVE
Comment: NORMAL
Neisseria Gonorrhea: NEGATIVE
Trichomonas: NEGATIVE

## 2022-11-03 IMAGING — US US PELVIS COMPLETE WITH TRANSVAGINAL
1 series · 13 of 25 positions shown · non-contrast
Comparison: 11/21/2020

CLINICAL DATA: Pelvic neck pain, dysmenorrhea, abnormal uterine
bleeding, history of hydrosalpinx and uterine leiomyoma, prior RIGHT
salpingo oophorectomy

EXAM:
TRANSABDOMINAL AND TRANSVAGINAL ULTRASOUND OF PELVIS
TECHNIQUE: Both transabdominal and transvaginal ultrasound examinations of the
pelvis were performed. Transabdominal technique was performed for
global imaging of the pelvis including uterus, ovaries, adnexal
regions, and pelvic cul-de-sac. It was necessary to proceed with
endovaginal exam following the transabdominal exam to visualize the
endometrium and LEFT ovary.

[Series 1: us pelvis complete with transvaginal · 13 of 78 slices shown]
[im 1/78]
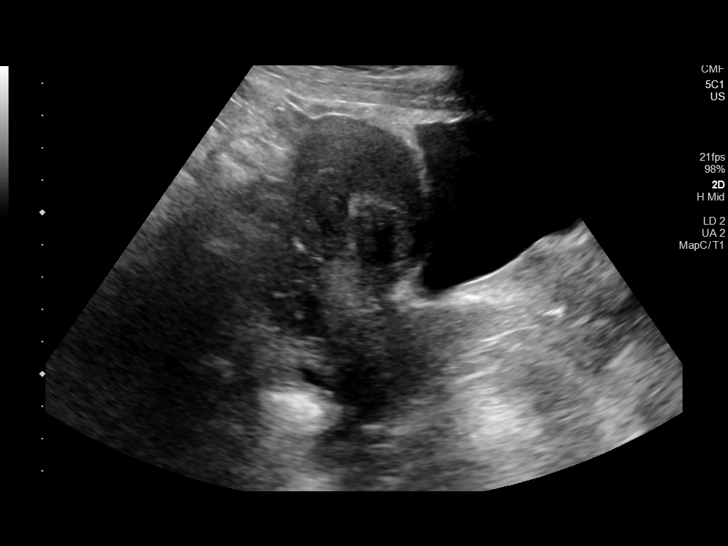
[im 7/78]
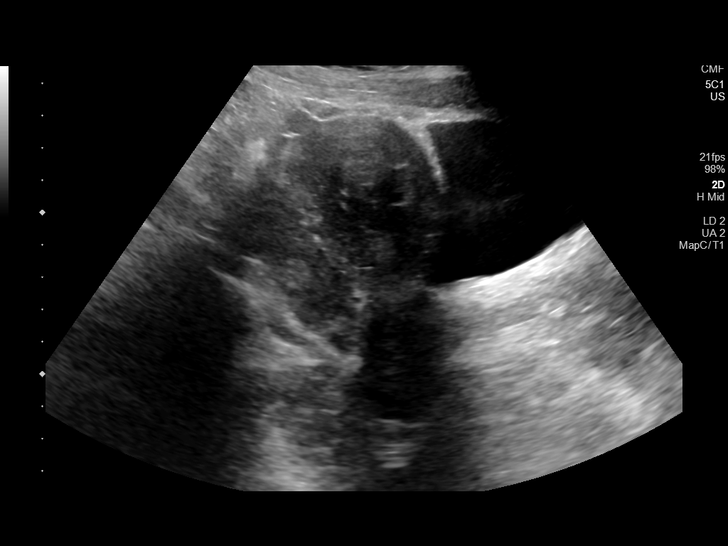
[im 13/78]
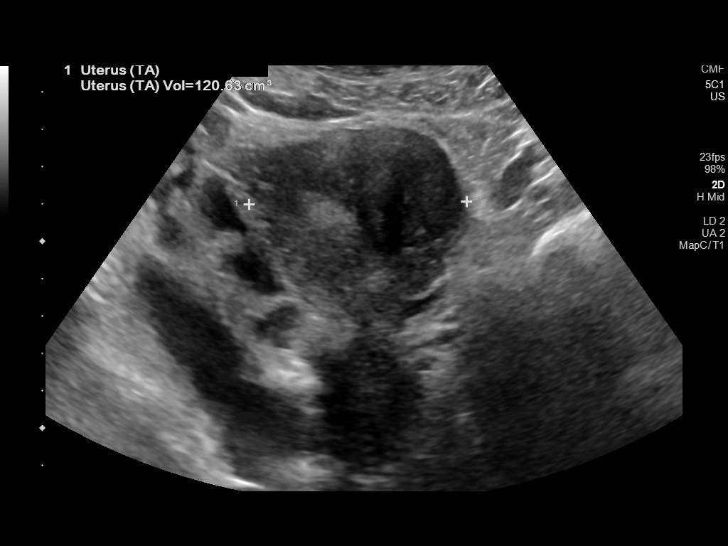
[im 20/78]
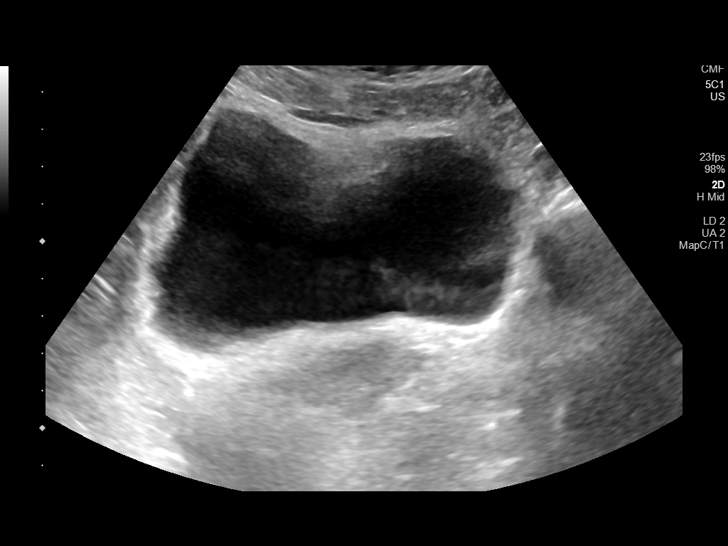
[im 26/78]
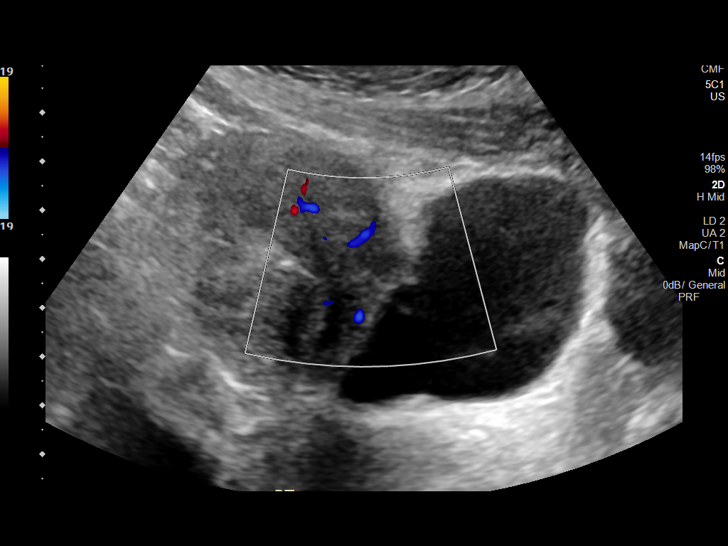
[im 33/78]
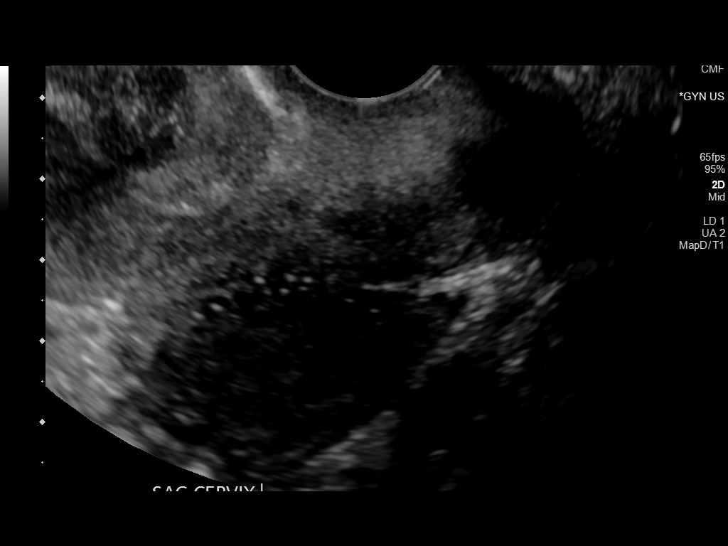
[im 39/78]
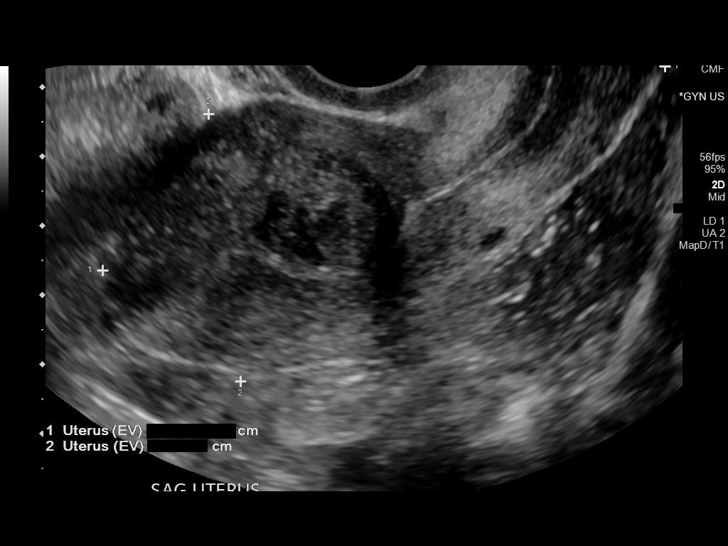
[im 45/78]
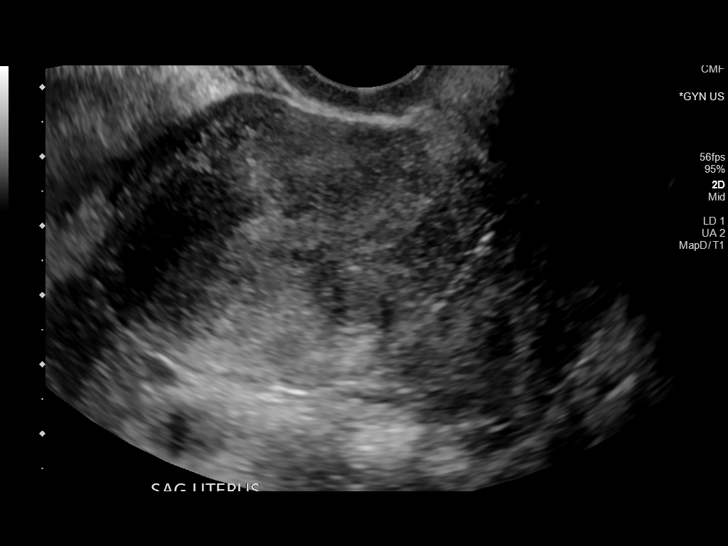
[im 52/78]
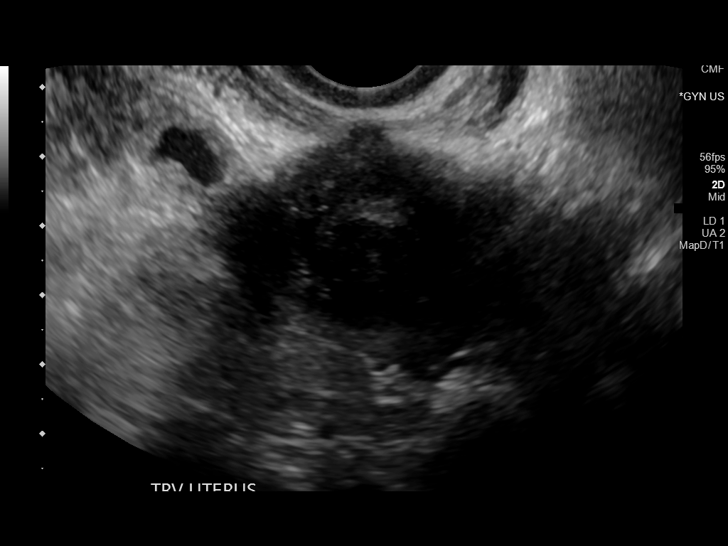
[im 58/78]
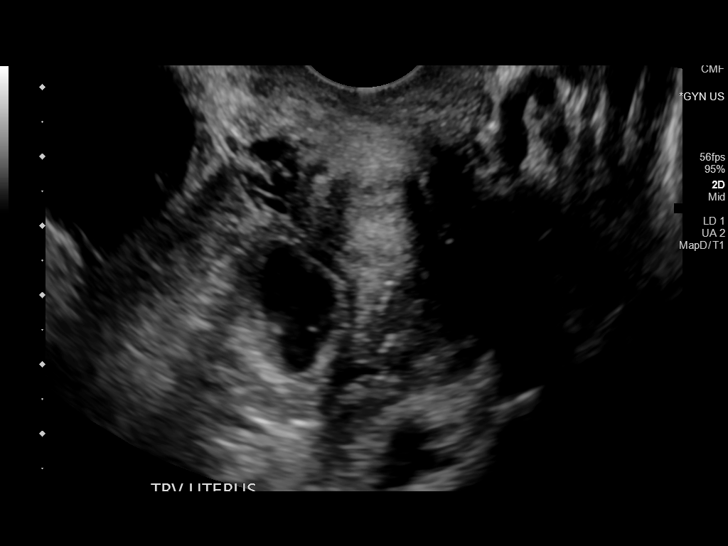
[im 65/78]
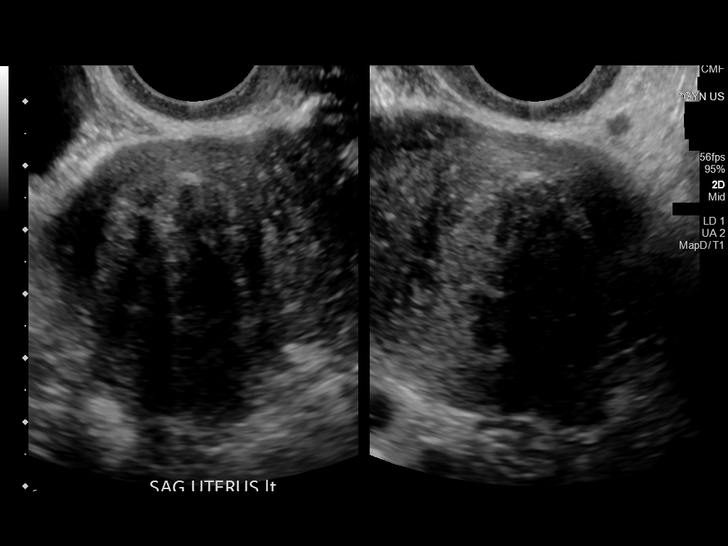
[im 71/78]
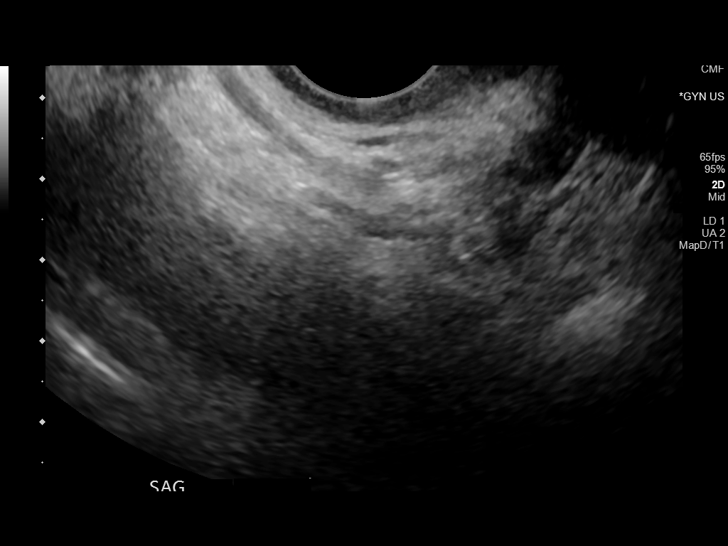
[im 78/78]
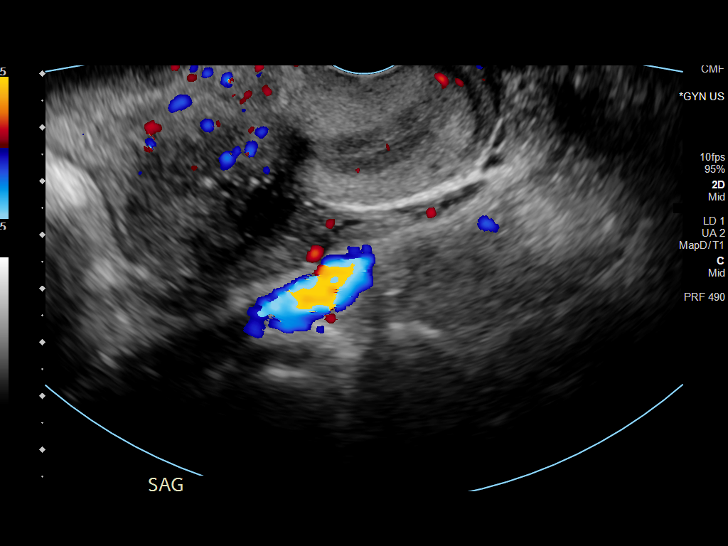

[13 of 25 positions shown; findings below may reference images not displayed]

FINDINGS: Uterus

Measurements: 8.6 x 3.9 x 5.6 cm = volume: 99 mL. Anteverted.
Heterogeneous myometrium. Submucosal leiomyoma anterior upper uterus
2.0 x 1.9 x 1.9 cm. Additional intramural leiomyoma LEFT uterus
x 2.3 x 2.2 cm.

Endometrium

Thickness: 6 mm.  No endometrial fluid or mass

Right ovary

Surgically absent

Left ovary

Measurements: 2.7 x 1.6 x 1.9 cm = volume: 4.3 mL. Normal morphology
without mass

Other findings

No free pelvic fluid.  No adnexal masses.
IMPRESSION: Two small uterine leiomyomata, one of which extend submucosal.

Unremarkable endometrial complex and LEFT ovary.

Surgical absence of RIGHT ovary.

## 2022-11-13 ENCOUNTER — Ambulatory Visit (INDEPENDENT_AMBULATORY_CARE_PROVIDER_SITE_OTHER): Payer: Commercial Managed Care - HMO | Admitting: Family Medicine

## 2022-11-13 ENCOUNTER — Other Ambulatory Visit (HOSPITAL_COMMUNITY)
Admission: RE | Admit: 2022-11-13 | Discharge: 2022-11-13 | Disposition: A | Discharge status: Home / Self Care | Payer: Managed Care, Other (non HMO) | Source: Ambulatory Visit | Attending: Family Medicine | Admitting: Family Medicine

## 2022-11-13 VITALS — BP 112/80 | HR 99 | Ht 62.0 in | Wt 165.2 lb

## 2022-11-13 DIAGNOSIS — R1030 Lower abdominal pain, unspecified: Secondary | ICD-10-CM

## 2022-11-13 DIAGNOSIS — R300 Dysuria: Secondary | ICD-10-CM | POA: Diagnosis not present

## 2022-11-13 DIAGNOSIS — N898 Other specified noninflammatory disorders of vagina: Secondary | ICD-10-CM

## 2022-11-13 LAB — POCT URINALYSIS DIP (MANUAL ENTRY)
Bilirubin, UA: NEGATIVE
Blood, UA: NEGATIVE
Glucose, UA: NEGATIVE mg/dL
Leukocytes, UA: NEGATIVE
Nitrite, UA: NEGATIVE
Protein Ur, POC: NEGATIVE mg/dL
Spec Grav, UA: >=1.03 — AB (ref 1.010–1.025)
Urobilinogen, UA: 0.2 U/dL
pH, UA: 5.5 (ref 5.0–8.0)

## 2022-11-13 NOTE — Assessment & Plan Note (Signed)
UA unremarkable.  Patient is status post total hysterectomy with right salpingo-oophorectomy for endometriosis.  Given history of endometriosis this is a consideration abdominal cramping.  Advised to continue supportive care and monitoring, reach out to OB/GYN if symptoms persist.  May also be a component of interstitial cystitis/painful bladder syndrome.

## 2022-11-13 NOTE — Progress Notes (Unsigned)
    SUBJECTIVE:   CHIEF COMPLAINT / HPI:   White vaginal discharge and lower abd cramping x1wk No fevers, vomiting, diarrhea. No itching or bleeding S/p total hysterectomy 11/2021 Sexually active  Dysuria/urinary frequency x1wk No hematuria, foul odor   PERTINENT  PMH / PSH: Hx BV treated w flagyl in August 2024  OBJECTIVE:   BP 112/80   Pulse 99   Ht 5\' 2"  (1.575 m)   Wt 165 lb 4 oz (75 kg)   [name] 02/23/2021 (Approximate)   SpO2 100%   BMI 30.22 kg/m    General: NAD, pleasant, able to participate in exam Respiratory: No respiratory distress Skin: warm and dry, no rashes noted Psych: Normal affect and mood  Pelvic exam: VULVA: normal appearing vulva with no masses, tenderness or lesions, VAGINA: normal appearing vagina with normal color and discharge, no lesions, CERVIX: surgically absent, exam chaperoned by Clabe Seal CMA.   ASSESSMENT/PLAN:   Assessment & Plan Vaginal discharge History of BV.  Will obtain tests for gonorrhea chlamydia, BV, trichomonas, yeast, HIV, syphilis.  Follow-up results and treat as appropriate. Lower abdominal pain UA unremarkable.  Patient is status post total hysterectomy with right salpingo-oophorectomy for endometriosis.  Given history of endometriosis this is a consideration abdominal cramping.  Advised to continue supportive care and monitoring, reach out to OB/GYN if symptoms persist.  May also be a component of interstitial cystitis/painful bladder syndrome. Dysuria UA unremarkable, follow-up urine culture and initiate treatment as appropriate.  Low suspicion for UTI.  Consider interstitial cystitis/painful bladder syndrome if symptoms persist.   Vonna Drafts, MD [facility]

## 2022-11-13 NOTE — Patient Instructions (Signed)
Please call your obgyn if your abdominal cramping gets worse as this may be related to your endometriosis or your surgery  I will let you know if any results from today are abnormal.  If they are abnormal, I will give you a call.  If everything is good, I will send you message on MyChart.

## 2022-11-14 LAB — HIV ANTIBODY (ROUTINE TESTING W REFLEX): HIV Screen 4th Generation wRfx: NONREACTIVE

## 2022-11-14 LAB — SYPHILIS: RPR W/REFLEX TO RPR TITER AND TREPONEMAL ANTIBODIES, TRADITIONAL SCREENING AND DIAGNOSIS ALGORITHM: RPR Ser Ql: NONREACTIVE

## 2022-11-17 ENCOUNTER — Encounter: Payer: Self-pay | Admitting: Student

## 2022-11-17 DIAGNOSIS — B3731 Acute candidiasis of vulva and vagina: Secondary | ICD-10-CM

## 2022-11-17 LAB — CERVICOVAGINAL ANCILLARY ONLY
Bacterial Vaginitis (gardnerella): POSITIVE — AB
Candida Glabrata: NEGATIVE
Candida Vaginitis: NEGATIVE
Chlamydia: NEGATIVE
Comment: NEGATIVE
Comment: NEGATIVE
Comment: NEGATIVE
Comment: NEGATIVE
Comment: NEGATIVE
Comment: NORMAL
Neisseria Gonorrhea: NEGATIVE
Trichomonas: NEGATIVE

## 2022-11-18 ENCOUNTER — Telehealth: Payer: Self-pay | Admitting: Family Medicine

## 2022-11-18 DIAGNOSIS — N760 Acute vaginitis: Secondary | ICD-10-CM

## 2022-11-18 MED ORDER — METRONIDAZOLE 500 MG PO TABS
500.0000 mg | ORAL_TABLET | Freq: Two times a day (BID) | ORAL | 0 refills | Status: AC
Start: 2022-11-18 — End: 2022-11-25

## 2022-11-18 NOTE — Telephone Encounter (Signed)
Attempted to call regarding +BV, left voicemail and sent mychart message.  Flagyl 500mg  BID x7d, sent to [facility].   Vonna Drafts, MD

## 2022-11-20 LAB — URINE CULTURE

## 2022-11-20 MED ORDER — FLUCONAZOLE 150 MG PO TABS
ORAL_TABLET | ORAL | 0 refills | Status: DC
Start: 2022-11-20 — End: 2023-04-05

## 2022-11-20 NOTE — Telephone Encounter (Signed)
Called patient.  Discussed that her urine culture did not show significant enough CFU's to warrant diagnosis of UTI (<100,000 CFU).  Her E. coli and GBS culture results are likely colonization of normal flora.  She had not yet picked up Flagyl for BV treatment.  Recommended she do this and complete 7-day course of Flagyl.  She reports that she normally gets yeast infection with this treatment, sent in Diflucan for this.  No further questions.  Vonna Drafts, MD

## 2022-11-25 ENCOUNTER — Ambulatory Visit: Payer: Managed Care, Other (non HMO) | Admitting: Student

## 2022-11-27 ENCOUNTER — Ambulatory Visit: Payer: Managed Care, Other (non HMO) | Admitting: Student

## 2022-12-16 ENCOUNTER — Ambulatory Visit: Payer: Managed Care, Other (non HMO) | Admitting: Student

## 2022-12-16 NOTE — Progress Notes (Deleted)
  SUBJECTIVE:   CHIEF COMPLAINT / HPI:   Vaginal Itching  PERTINENT  PMH / PSH: ***  Past Medical History:  Diagnosis Date   Arthritis    left ankle   COVID-19 09/24/2020   flu like symptoms per pt   Endometriosis    w/abnormal uterine bleeding, Pt follows with Dr. Jackson Latino, LOV 08/27/21 as of 10/22/21.   Ganglion cyst of foot    left foot, hx of aspiration, Follows with Dr. Rayburn Go, Sports Medicine, lov 10/06/21   GERD (gastroesophageal reflux disease)    occasional with spicy foods   History of 2019 novel coronavirus disease (COVID-19)    pt tested positive 03-03-2019, results in care everywhere.  (05-22-2019 per pt had symptoms of fatigue, headache, and loss of taste/ smell;  fatigue and headache after one week and loss taste/ smell resolved after 3 wks)   Infertility, female    Ovarian cyst    Pelvic mass in female    large fluid filled pelvic mass   Recurrent boils    Septate uterus    Trichomoniasis    hx of   OBJECTIVE:  [name] 02/23/2021 (Approximate)  Physical Exam   ASSESSMENT/PLAN:   Assessment & Plan  No follow-ups on file. Bess Kinds, MD 12/16/2022, 2:55 PM PGY-***, [facility] {    This will disappear when note is signed, click to select method of visit    :1}

## 2022-12-17 ENCOUNTER — Ambulatory Visit: Payer: Managed Care, Other (non HMO) | Admitting: Student

## 2022-12-18 ENCOUNTER — Ambulatory Visit (INDEPENDENT_AMBULATORY_CARE_PROVIDER_SITE_OTHER): Payer: Managed Care, Other (non HMO)

## 2022-12-18 VITALS — BP 111/69 | HR 86 | Ht 62.0 in | Wt 164.8 lb

## 2022-12-18 DIAGNOSIS — N9089 Other specified noninflammatory disorders of vulva and perineum: Secondary | ICD-10-CM | POA: Diagnosis not present

## 2022-12-18 DIAGNOSIS — L989 Disorder of the skin and subcutaneous tissue, unspecified: Secondary | ICD-10-CM | POA: Diagnosis not present

## 2022-12-18 MED ORDER — DOXYCYCLINE HYCLATE 100 MG PO TABS
100.0000 mg | ORAL_TABLET | Freq: Two times a day (BID) | ORAL | 0 refills | Status: AC
Start: 2022-12-18 — End: 2022-12-25

## 2022-12-18 MED ORDER — HYDROCORTISONE 1 % EX OINT
1.0000 | TOPICAL_OINTMENT | Freq: Two times a day (BID) | CUTANEOUS | 0 refills | Status: DC
Start: 2022-12-18 — End: 2023-06-24

## 2022-12-18 NOTE — Patient Instructions (Addendum)
It was great to see you today! Thank you for choosing [facility] for your primary care.  Today we addressed: Apply hydrocortisone on OUTSIDE of vagina twice a a day for 7-14 days, return if worsening  Doxycycline twice daily for 7 days   If you haven't already, sign up for My Chart to have easy access to your labs results, and communication with your primary care physician. I recommend that you always bring your medications to each appointment as this makes it easy to ensure you are on the correct medications and helps [place] not miss refills when you need them. Call the clinic at [phone] if your symptoms worsen or you have any concerns. As needed Please arrive 15 minutes before your appointment to ensure smooth check in process.  We appreciate your efforts in making this happen.  Thank you for allowing me to participate in your care, Alfredo Martinez, MD 12/18/2022, 2:06 PM PGY-3, [facility]

## 2022-12-18 NOTE — Progress Notes (Unsigned)
    SUBJECTIVE:   CHIEF COMPLAINT / HPI:   Vaginal Itching: - Ongoing 2 weeks  - Previously tested for BV--treated with metronidazole - Had negative chlamydia, trichomonas - preferred gender of partner: *** - Medications tried: *** - Sexually active with *** *** partner(s) - Last sexual encounter: *** - Contraception: *** Symptoms include: {STISXs:28021}   PERTINENT  PMH / PSH: ***  OBJECTIVE:   [name] 02/23/2021 (Approximate)   General: Alert and oriented in no apparent distress Heart: Regular rate and rhythm with no murmurs appreciated Lungs: CTA bilaterally, no wheezing Abdomen: Bowel sounds present, no abdominal pain Skin: Warm and dry Extremities: No lower extremity edema   ASSESSMENT/PLAN:   Assessment & Plan Skin lesion  Vulvar irritation      Alfredo Martinez, MD [facility]

## 2023-01-05 ENCOUNTER — Ambulatory Visit (INDEPENDENT_AMBULATORY_CARE_PROVIDER_SITE_OTHER): Payer: Commercial Managed Care - HMO | Admitting: Family Medicine

## 2023-01-05 ENCOUNTER — Encounter: Payer: Self-pay | Admitting: Family Medicine

## 2023-01-05 ENCOUNTER — Ambulatory Visit (HOSPITAL_COMMUNITY)
Admission: RE | Admit: 2023-01-05 | Discharge: 2023-01-05 | Disposition: A | Discharge status: Home / Self Care | Payer: Commercial Managed Care - HMO | Source: Ambulatory Visit | Attending: Family Medicine | Admitting: Family Medicine

## 2023-01-05 ENCOUNTER — Telehealth: Payer: Self-pay

## 2023-01-05 VITALS — BP 118/77 | HR 93 | Ht 62.0 in | Wt 165.6 lb

## 2023-01-05 DIAGNOSIS — M940 Chondrocostal junction syndrome [Tietze]: Secondary | ICD-10-CM | POA: Insufficient documentation

## 2023-01-05 NOTE — Progress Notes (Deleted)
    SUBJECTIVE:   CHIEF COMPLAINT / HPI:   Intermittent chest pains for the past 2 days, midline ***  PERTINENT  PMH / PSH: ***  OBJECTIVE:   [name] 02/23/2021 (Approximate)   ***  ASSESSMENT/PLAN:   Assessment & Plan    Vonna Drafts, MD [facility]

## 2023-01-05 NOTE — Patient Instructions (Addendum)
Your EKG did not show any concerning findings today.   Please review attached information about costochondritis

## 2023-01-05 NOTE — Telephone Encounter (Signed)
Patient calls nurse line requesting an apt.   She reports she has been having intermittent chest pains for the past 2 days. She reports the pain is located in the center of her chest.   She reports she lifts a lot at work and is unsure if this is the cause of the pain.   She denies any shortness of breath, dizziness, nausea, left arm pain or jaw pain.   Patient scheduled for this afternoon for evaluation.

## 2023-01-05 NOTE — Progress Notes (Cosign Needed Addendum)
    SUBJECTIVE:   CHIEF COMPLAINT / HPI:   Intermittent chest pains for the past 2 days Currently experiencing mild chest pain  CHEST PAIN Time since onset: Duration: 2 days Onset: sudden Quality: aching Severity: 6/10 Location: center of chest Radiation: upper back yesterday while lying down Episode duration: couple of hours Related to exertion: no Activity when pain started: At work in [employer]. Was not lifting anything immediately prior to onset Trauma: no Anxiety/recent stressors: no Aggravating factors: standing up working Alleviating factors: lying down to rest Status: fluctuating Treatments attempted: nothing Current pain status: pain free Shortness of breath: yes Nausea: no Diaphoresis: no Heartburn: no Palpitations: no   SOB: Onset two days ago when she is moving at work.   Had a headache today where she thinks It came on from waking up too fast. Is unable to characterize location.   States it doesn't feel like her usual acid reflux which burns in her throat.   PERTINENT  PMH / PSH: acid reflux (takes rolaids), 1/2 pack a day for over 20 years.   OBJECTIVE:   BP 118/77   Pulse 93   Ht 5\' 2"  (1.575 m)   Wt 165 lb 9.6 oz (75.1 kg)   [name] 02/23/2021 (Approximate)   SpO2 100%   BMI 30.29 kg/m    General: NAD, pleasant, able to participate in exam Cardiac: RRR, no murmurs auscultated. Chest wall tender to palpation along mid upper sternum Respiratory: CTAB, normal WOB Abdomen: soft, non-tender, non-distended, normoactive bowel sounds Extremities: warm and well perfused, no edema or cyanosis Skin: warm and dry, no rashes noted Neuro: alert, no obvious focal deficits, speech normal Psych: Normal affect and mood  ASSESSMENT/PLAN:   Assessment & Plan Costochondritis EKG unremarkable. Low risk for CAD (interchest score of -1). Low suspicion for ACS or PE given nature of her pain, reproducibility on palpation, benign exam, normal vitals, and few risk  factors aside from smoking history. Provided strict return precautions and discussed common symptoms of cardiac pain. Advised OTC NSAID use to reduce inflammation.    Syliva Overman, Medical Student [facility]  I was personally present and performed or re-performed the history, physical exam and medical decision making activities of this service and have verified that the service and findings are accurately documented in the student's note.  Vonna Drafts, MD                  01/05/2023, 3:59 PM

## 2023-02-11 IMAGING — US US PELVIS COMPLETE WITH TRANSVAGINAL
1 series · 13 of 25 positions shown · non-contrast
Comparison: Ultrasound 03/24/2021, 08/09/2020

CLINICAL DATA: Pelvic bleeding

EXAM:
TRANSABDOMINAL AND TRANSVAGINAL ULTRASOUND OF PELVIS
TECHNIQUE: Both transabdominal and transvaginal ultrasound examinations of the
pelvis were performed. Transabdominal technique was performed for
global imaging of the pelvis including uterus, ovaries, adnexal
regions, and pelvic cul-de-sac. It was necessary to proceed with
endovaginal exam following the transabdominal exam to visualize the
uterus endometrium ovaries.

[Series 1: us pelvic complete with transvaginal · 102 acquisitions, 13 frames shown]
[im 1/102]
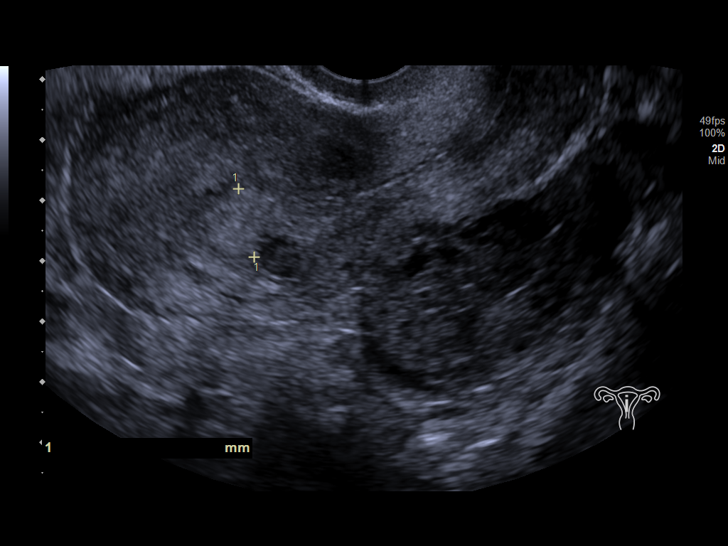
[im 9/102]
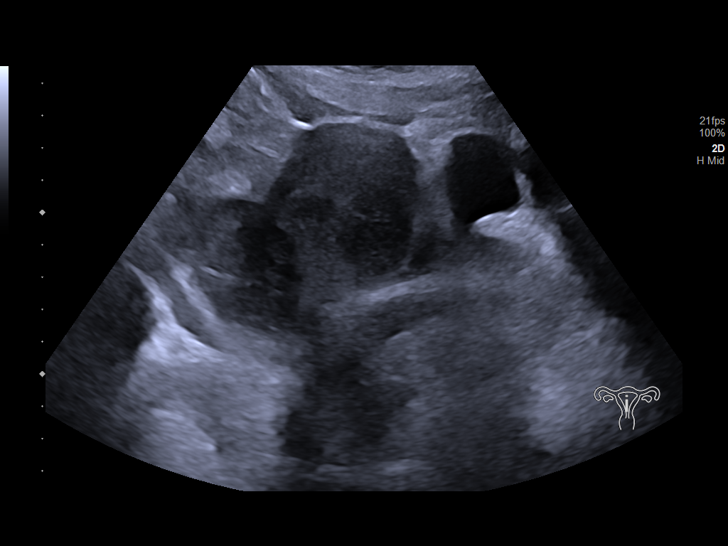
[im 17/102]
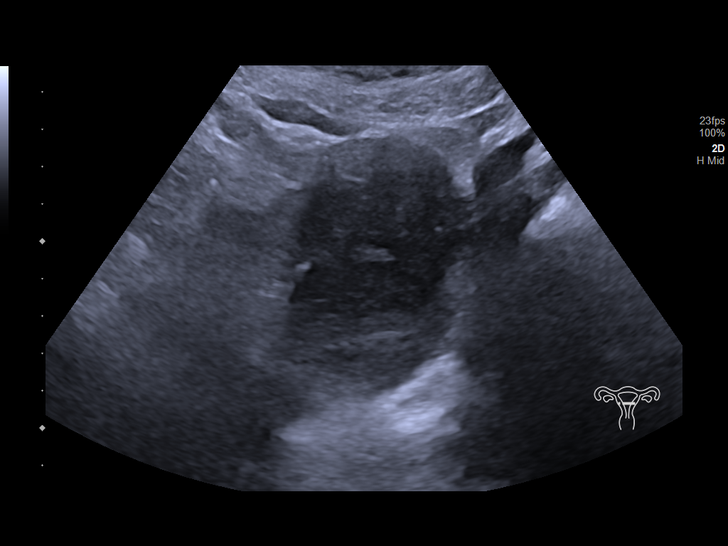
[im 26/102]
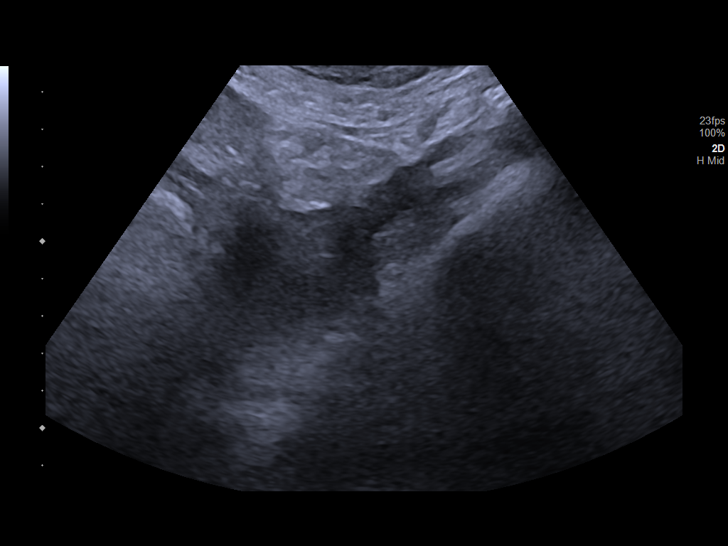
[im 34/102]
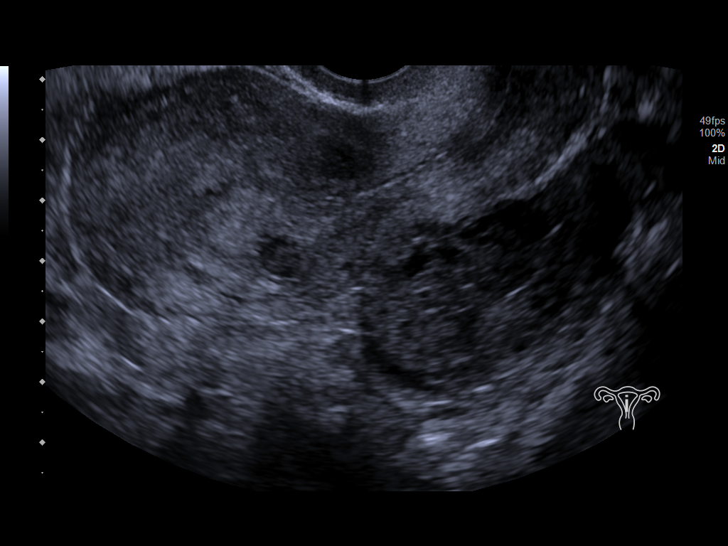
[im 43/102]
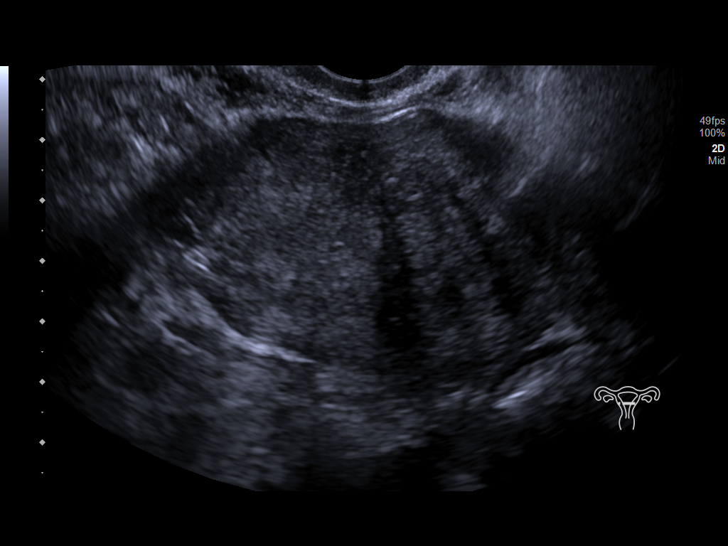
[im 51/102]
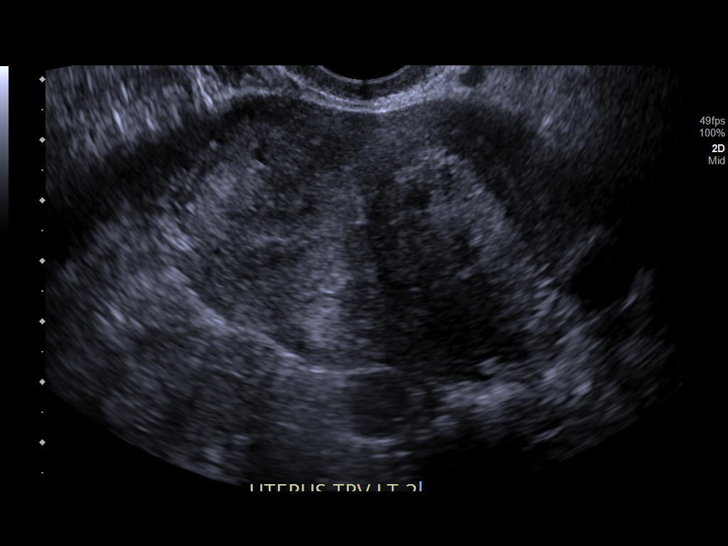
[im 59/102]
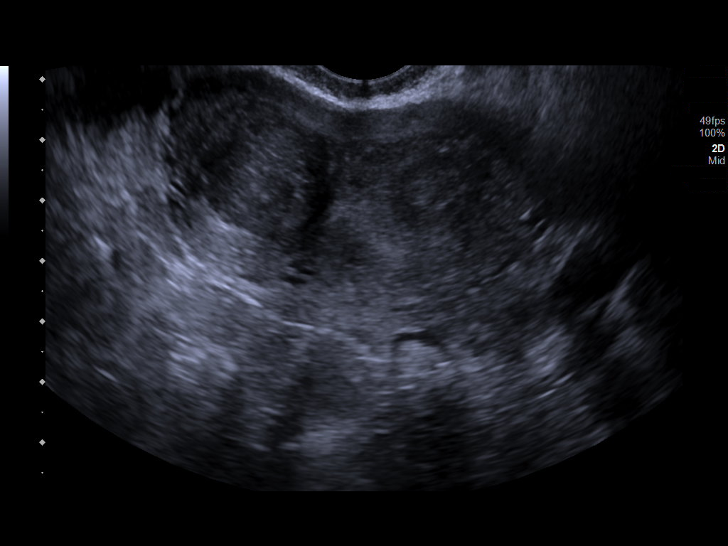
[im 68/102]
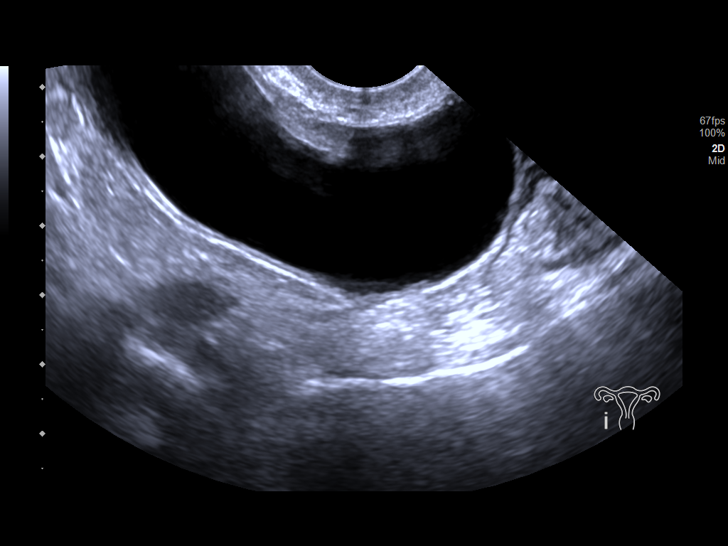
[im 76/102]
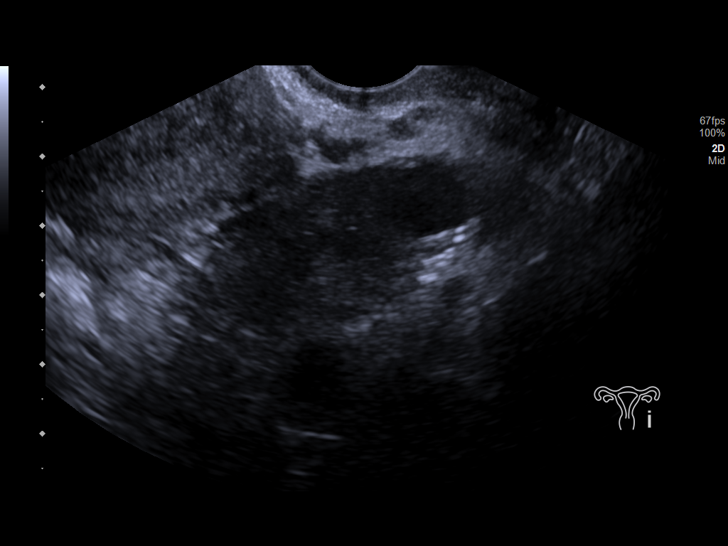
[im 85/102]
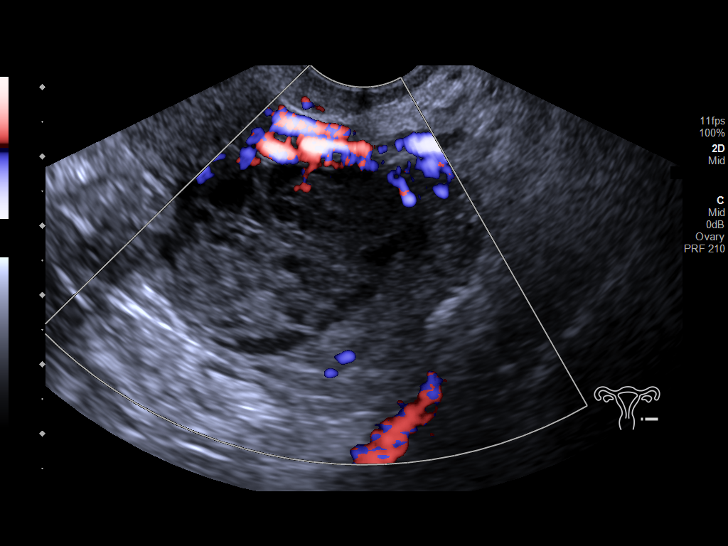
[im 93/102]
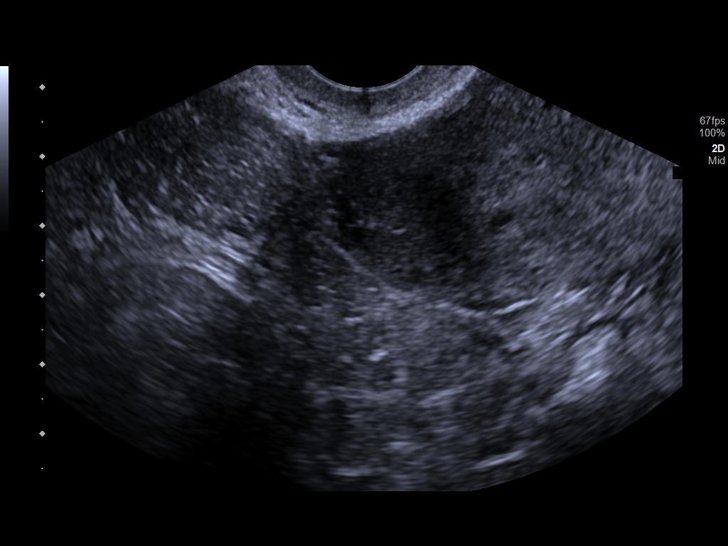
[im 102/102]
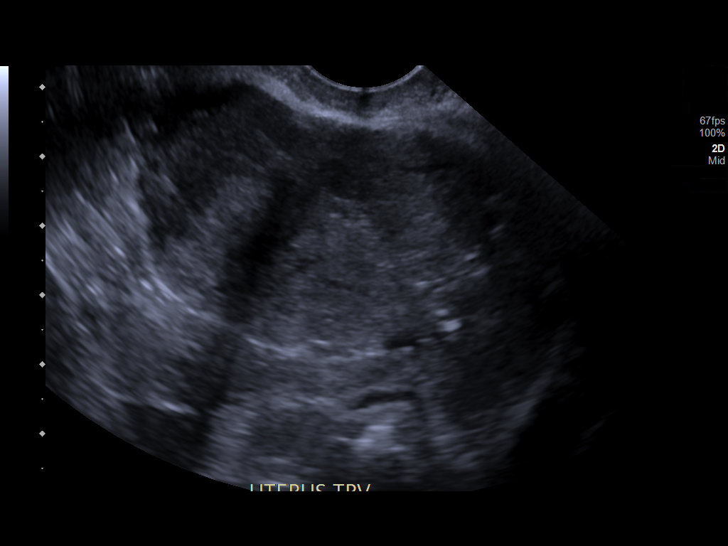

[13 of 25 positions shown; findings below may reference images not displayed]

FINDINGS: Uterus

Measurements: 6.9 x 4.5 x 6.3 cm = volume: 100.7 mL. Multiple
uterine fibroids. Right uterine fundal fibroid measures 22 x 19 x 23
mm and extends submucosal. Left uterine fibroid measures 3.1 x 3 x
2.7 cm. Small posterior intramural fibroid measuring 10 x 7 by 9 mm.

Endometrium

Thickness: 11.6 mm.  No focal abnormality visualized.

Right ovary

Surgically absent. Fluid echogenicity structure at the right adnexa,
suspected to represent hydrosalpinx. This is present on previous
exams.

Left ovary

Measurements: 5.4 x 3 x 4.3 cm = volume: 37.3 mL. Complex mass
measuring 4.3 x 2.3 by 3.3 cm contains internal echoes and small
cystic spaces.

Other findings

No abnormal free fluid.
IMPRESSION: 1. Multiple uterine fibroids some of which extend submucosal.
Endometrial thickness within normal limits for premenopausal patient
2. Surgically absent right ovary. Large fluid echogenicity structure
at the right adnexa slightly tubular in configuration and suspected
to represent hydrosalpinx
3. 4.3 cm complex left ovarian mass, possible hemorrhagic cyst or
endometrioma. Suggest 6-12 week sonographic follow-up

## 2023-02-24 ENCOUNTER — Other Ambulatory Visit (HOSPITAL_COMMUNITY)
Admission: RE | Admit: 2023-02-24 | Discharge: 2023-02-24 | Disposition: A | Discharge status: Home / Self Care | Payer: Commercial Managed Care - HMO | Source: Ambulatory Visit | Attending: Family Medicine | Admitting: Family Medicine

## 2023-02-24 ENCOUNTER — Encounter: Payer: Self-pay | Admitting: Family Medicine

## 2023-02-24 ENCOUNTER — Ambulatory Visit: Payer: Commercial Managed Care - HMO | Admitting: Family Medicine

## 2023-02-24 VITALS — BP 105/66 | HR 82

## 2023-02-24 DIAGNOSIS — N898 Other specified noninflammatory disorders of vagina: Secondary | ICD-10-CM | POA: Diagnosis present

## 2023-02-24 DIAGNOSIS — Z113 Encounter for screening for infections with a predominantly sexual mode of transmission: Secondary | ICD-10-CM | POA: Insufficient documentation

## 2023-02-24 DIAGNOSIS — Z90710 Acquired absence of both cervix and uterus: Secondary | ICD-10-CM | POA: Insufficient documentation

## 2023-02-24 NOTE — Patient Instructions (Signed)
 It was great to see you!  Our plans for today:  - We are checking some labs today, we will release these results to your MyChart. - Make sure you wear a condom every time you have sex for the full time. - Complete your antibiotic course for your boil.   Take care and seek immediate care sooner if you develop any concerns.   Dr. Laurice Kimmons

## 2023-02-24 NOTE — Progress Notes (Signed)
   SUBJECTIVE:   CHIEF COMPLAINT / HPI:   Vaginal discharge - having some white vaginal discharge for 1 week, more than usual. No fevers. No rashes, ulcers. No abnormal smell. Has h/o BV. Has current labial boil that start draining few days ago, received doxycyline for this but not currently taking. Wants STI testing.  OBJECTIVE:   BP 105/66 (BP Location: Left Arm, Patient Position: Sitting, Cuff Size: Normal)   Pulse 82   [name] 02/23/2021 (Approximate)   SpO2 100%   Gen: well appearing, in NAD GYN:  External genitalia within normal limits.  Vaginal mucosa pink, moist, normal rugae.  Cervix surgically absent. Moderate amount of white discharge noted on speculum exam, no bleeding. L lower labial boil previously drained, no fluctuance or active draining on exam, small amount of induration and erythema surrounding.  ASSESSMENT/PLAN:   Problem List Items Addressed This Visit       Other   Screen for STD (sexually transmitted disease)   Relevant Orders   Cervicovaginal ancillary only   HIV antibody (with reflex)   RPR   Hepatitis C antibody   Other Visit Diagnoses       Vaginal discharge    -  Primary   Relevant Orders   Cervicovaginal ancillary only   HIV antibody (with reflex)   RPR   Hepatitis C antibody      Vaginal boil - no fluctuance or active drainage on exam though still indurated. Encouraged completing doxycycline  course.  Kandis Ormond, DO

## 2023-02-25 ENCOUNTER — Encounter: Payer: Self-pay | Admitting: Family Medicine

## 2023-02-25 LAB — HEPATITIS C ANTIBODY: Hep C Virus Ab: NONREACTIVE

## 2023-02-25 LAB — HIV ANTIBODY (ROUTINE TESTING W REFLEX): HIV Screen 4th Generation wRfx: NONREACTIVE

## 2023-02-25 LAB — RPR: RPR Ser Ql: NONREACTIVE

## 2023-02-26 ENCOUNTER — Encounter: Payer: Self-pay | Admitting: Family Medicine

## 2023-02-26 LAB — CERVICOVAGINAL ANCILLARY ONLY
Bacterial Vaginitis (gardnerella): NEGATIVE
Candida Glabrata: NEGATIVE
Candida Vaginitis: NEGATIVE
Chlamydia: NEGATIVE
Comment: NEGATIVE
Comment: NEGATIVE
Comment: NEGATIVE
Comment: NEGATIVE
Comment: NEGATIVE
Comment: NORMAL
Neisseria Gonorrhea: NEGATIVE
Trichomonas: NEGATIVE

## 2023-04-04 ENCOUNTER — Encounter: Payer: Self-pay | Admitting: *Deleted

## 2023-04-04 ENCOUNTER — Other Ambulatory Visit: Payer: Self-pay

## 2023-04-04 ENCOUNTER — Encounter: Payer: Self-pay | Admitting: Student

## 2023-04-04 ENCOUNTER — Ambulatory Visit
Admission: EM | Admit: 2023-04-04 | Discharge: 2023-04-04 | Disposition: A | Discharge status: Home / Self Care | Payer: Commercial Managed Care - HMO | Attending: Emergency Medicine | Admitting: Emergency Medicine

## 2023-04-04 DIAGNOSIS — H9203 Otalgia, bilateral: Secondary | ICD-10-CM | POA: Diagnosis not present

## 2023-04-04 DIAGNOSIS — K1379 Other lesions of oral mucosa: Secondary | ICD-10-CM

## 2023-04-04 MED ORDER — AMOXICILLIN-POT CLAVULANATE 875-125 MG PO TABS: 1.0000 | ORAL_TABLET | Freq: Two times a day (BID) | ORAL | 0 refills | Status: DC

## 2023-04-04 NOTE — Discharge Instructions (Addendum)
 Follow up with your Physician for recheck

## 2023-04-04 NOTE — ED Triage Notes (Signed)
 Pt reports roof of mouth "burning" 2 days ago. Painful to eat. Now c/o bilateral ear pain, headache, facial pain also. Taking tylenol, last dose at 0500

## 2023-04-04 NOTE — ED Provider Notes (Signed)
 [facility]    CSN: [id number] Arrival date & time: 04/04/23  0801      History   Chief Complaint Chief Complaint  Patient presents with   Facial Pain    HPI [name] is a 43 y.o. female.   Pt complains of pain in top of her mouth. Pt complains of pain in both ears.  Pt  complains of a headache and pain in both of her ears.  Unknown flu or covid exposure      Past Medical History:  Diagnosis Date   Adhesions of adnexa of uterus due to endometriosis 11/12/2021   Arthritis    left ankle   COVID-19 09/24/2020   flu like symptoms per pt   Endometriosis    w/abnormal uterine bleeding, Pt follows with Dr. Jackson Latino, LOV 08/27/21 as of 10/22/21.   Ganglion cyst of foot    left foot, hx of aspiration, Follows with Dr. Rayburn Go, Sports Medicine, lov 10/06/21   GERD (gastroesophageal reflux disease)    occasional with spicy foods   History of 2019 novel coronavirus disease (COVID-19)    pt tested positive 03-03-2019, results in care everywhere.  (05-22-2019 per pt had symptoms of fatigue, headache, and loss of taste/ smell;  fatigue and headache after one week and loss taste/ smell resolved after 3 wks)   Hydrosalpinx 12/30/2018   10x4x5cm right hydrosalpinx  Also with 2 small fibroids and left hemorrhagic cyst  Offered obs vs surgical intervention. Pt desires surgery  Move to [facility] R salpingectomy     Infertility, female    Menometrorrhagia 09/21/2006   Qualifier: Diagnosis of   By: Barbaraann Barthel MD, [place]         Menorrhagia 12/25/2019   Ovarian cyst    Pelvic mass in female    large fluid filled pelvic mass   Recurrent boils    Septate uterus    SEPTATE UTERUS 07/24/2009   Qualifier: Diagnosis of   By: Daphine Deutscher FNP, [place]         Trichomoniasis    hx of   Viral labyrinthitis 06/20/2007   Qualifier: Diagnosis of   By: Barbaraann Barthel MD, [place]          Patient Active Problem List   Diagnosis Date Noted   S/P TAH (total abdominal hysterectomy)  02/24/2023   Bacterial vaginosis 02/06/2022   Pelvic pain in female 11/11/2021   Pain of joint of left ankle and foot 10/06/2021   Screen for STD (sexually transmitted disease) 09/01/2021   Fatigue 09/01/2021   Exposure to sexually transmitted disease (STD) 03/06/2021   Molluscum contagiosum 12/19/2020   Abdominal pain 08/16/2020   Constipation 08/16/2020   Hydradenitis 06/24/2020   Ganglion cyst of left foot 06/13/2020   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   Vaginal itching 07/08/2019   GERD (gastroesophageal reflux disease) 06/07/2019   HSV-2 seropositive 03/07/2019   TOBACCO ABUSE 09/26/2008   Female infertility 09/26/2008    Past Surgical History:  Procedure Laterality Date   BREAST BIOPSY Right 1999   per pt benign    CYSTOSCOPY N/A 11/11/2021   Procedure: CYSTOSCOPY;  Surgeon: Willodean Rosenthal, MD;  Location: [facility] ;  Service: Gynecology;  Laterality: N/A;   DILITATION & CURRETTAGE/HYSTROSCOPY WITH NOVASURE ABLATION N/A 05/08/2021   Procedure: HYSTEROSCOPY, DILATATION & CURETTAGE, ENDOMETRIAL ABLATION WITH NOVASURE;  Surgeon: Willodean Rosenthal, MD;  Location: [facility];  Service: Gynecology;  Laterality: N/A;   INCISION AND DRAINAGE     right axilla, 2019, 2020, 2022, 2023  LAPAROSCOPIC SALPINGOOPHERECTOMY Right 03-25-2010   @[facility]    and LYSIS ADHESIONS   LAPAROSCOPY N/A 05/30/2019   Procedure: DIAGNOSTIC LAPAROSCOPY;  Surgeon: Adam Phenix, MD;  Location: [facility];  Service: Gynecology;  Laterality: N/A;   ROBOTIC ASSISTED TOTAL HYSTERECTOMY N/A 11/11/2021   Procedure: XI ROBOTIC ASSISTED TOTAL HYSTERECTOMY WITH LEFT SALPINGECTOMY;  Surgeon: Willodean Rosenthal, MD;  Location: [facility];  Service: Gynecology;  Laterality: N/A;    OB History     Gravida  0   Para      Term      Preterm      AB      Living  0      SAB      IAB      Ectopic      Multiple      Live Births               Home  Medications    Prior to Admission medications   Medication Sig Start Date End Date Taking? Authorizing Provider  amoxicillin-clavulanate (AUGMENTIN) 875-125 MG tablet Take 1 tablet by mouth 2 (two) times daily. 04/04/23  Yes Cheron Schaumann K, PA-C  fluconazole (DIFLUCAN) 150 MG tablet For yeast infection after completing course of Flagyl: Take 1 tablet after completing your course of Flagyl. Then, 72 hours later, take another tablet if symptoms persist 11/20/22   Vonna Drafts, MD  hydrocortisone 1 % ointment Apply 1 Application topically 2 (two) times daily. 12/18/22   Alfredo Martinez, MD  Norethindrone Acetate-Ethinyl Estrad-FE (LOESTRIN 24 FE) 1-20 MG-MCG(24) tablet Take 1 tablet by mouth daily. 03/21/20 05/28/20  Willodean Rosenthal, MD  Norgestimate-Ethinyl Estradiol Triphasic (TRI-SPRINTEC) 0.18/0.215/0.25 MG-35 MCG tablet Take 1 tablet by mouth daily. 11/03/10 05/01/11  Adam Phenix, MD    Family History Family History  Problem Relation Age of Onset   Hypertension Mother    Diabetes Mother    Cancer Father    Hypertension Father    Diabetes Brother    Hypertension Brother    Breast cancer Neg Hx     Social History Social History   Tobacco Use   Smoking status: Every Day    Current packs/day: 0.50    Average packs/day: 0.5 packs/day for 20.0 years (10.0 ttl pk-yrs)    Types: Cigarettes   Smokeless tobacco: Never  Vaping Use   Vaping status: Never Used  Substance Use Topics   Alcohol use: Yes    Comment: occasionally on weekends   Drug use: Never     Allergies   Sulfa antibiotics   Review of Systems Review of Systems  HENT:  Positive for ear pain and sore throat.      Physical Exam Triage Vital Signs ED Triage Vitals  Encounter Vitals Group     BP 04/04/23 0817 (!) 148/94     Systolic BP Percentile --      Diastolic BP Percentile --      Pulse Rate 04/04/23 0817 98     Resp 04/04/23 0817 18     Temp 04/04/23 0817 98.3 F (36.8 C)     Temp Source  04/04/23 0817 Oral     SpO2 04/04/23 0817 100 %     Weight --      Height --      Head Circumference --      Peak Flow --      Pain Score 04/04/23 0813 10     Pain Loc --  Pain Education --      Exclude from Growth Chart --    No data found.  Updated Vital Signs BP (!) 148/94 (BP Location: Left Arm)   Pulse 98   Temp 98.3 F (36.8 C) (Oral)   Resp 18   [name] 02/23/2021 (Approximate)   SpO2 100%   Visual Acuity Right Eye Distance:   Left Eye Distance:   Bilateral Distance:    Right Eye Near:   Left Eye Near:    Bilateral Near:     Physical Exam Vitals reviewed.  Constitutional:      Appearance: Normal appearance.  HENT:     Right Ear: Tympanic membrane normal.     Left Ear: Tympanic membrane normal.     Mouth/Throat:     Pharynx: Posterior oropharyngeal erythema present.     Comments: Erythema roof of mouth, no palate edema.  Discolored and broken teeth,  no obvious abscess,   Cardiovascular:     Rate and Rhythm: Normal rate.  Pulmonary:     Effort: Pulmonary effort is normal.  Skin:    General: Skin is warm.  Neurological:     General: No focal deficit present.     Mental Status: She is alert.  Psychiatric:        Mood and Affect: Mood normal.      UC Treatments / Results  Labs (all labs ordered are listed, but only abnormal results are displayed) Labs Reviewed - No data to display  EKG   Radiology No results found.  Procedures Procedures (including critical care time)  Medications Ordered in UC Medications - No data to display  Initial Impression / Assessment and Plan / UC Course  I have reviewed the triage vital signs and the nursing notes.  Pertinent labs & imaging results that were available during my care of the patient were reviewed by me and considered in my medical decision making (see chart for details).     Pain in roof of mouth and ear pain my be sinus related.  Pt dvised follow up with primary care and dentist for recheck   Final Clinical Impressions(s) / UC Diagnoses   Final diagnoses:  Mouth pain  Ear pain, bilateral     Discharge Instructions      Follow up with your Physician for recheck    ED Prescriptions     Medication Sig Dispense Auth. Provider   amoxicillin-clavulanate (AUGMENTIN) 875-125 MG tablet Take 1 tablet by mouth 2 (two) times daily. 20 tablet Elson Areas, [place]      PDMP not reviewed this encounter. An After Visit Summary was printed and given to the patient.       Elson Areas, [place] 04/04/23 6213

## 2023-04-05 ENCOUNTER — Other Ambulatory Visit: Payer: Self-pay | Admitting: Student

## 2023-04-05 DIAGNOSIS — B3731 Acute candidiasis of vulva and vagina: Secondary | ICD-10-CM

## 2023-04-05 MED ORDER — FLUCONAZOLE 150 MG PO TABS
150.0000 mg | ORAL_TABLET | Freq: Once | ORAL | 0 refills | Status: AC
Start: 2023-04-05 — End: 2023-04-05

## 2023-04-05 NOTE — Progress Notes (Signed)
 Patient request Rx for Diflucan as she is being treated with antibiotics from an urgent care and typically gets vaginal yeast infections when on antibiotics.  Rx for Diflucan sent to pharmacy

## 2023-04-06 ENCOUNTER — Encounter: Payer: Self-pay | Admitting: Student

## 2023-04-06 ENCOUNTER — Ambulatory Visit (INDEPENDENT_AMBULATORY_CARE_PROVIDER_SITE_OTHER): Payer: Commercial Managed Care - HMO | Admitting: Student

## 2023-04-06 ENCOUNTER — Other Ambulatory Visit (HOSPITAL_COMMUNITY)
Admission: RE | Admit: 2023-04-06 | Discharge: 2023-04-06 | Disposition: A | Discharge status: Home / Self Care | Payer: Commercial Managed Care - HMO | Source: Ambulatory Visit | Attending: Family Medicine | Admitting: Family Medicine

## 2023-04-06 VITALS — BP 112/80 | HR 83 | Ht 62.0 in | Wt 161.0 lb

## 2023-04-06 DIAGNOSIS — Z113 Encounter for screening for infections with a predominantly sexual mode of transmission: Secondary | ICD-10-CM | POA: Diagnosis not present

## 2023-04-06 DIAGNOSIS — R208 Other disturbances of skin sensation: Secondary | ICD-10-CM

## 2023-04-06 DIAGNOSIS — N898 Other specified noninflammatory disorders of vagina: Secondary | ICD-10-CM | POA: Insufficient documentation

## 2023-04-06 LAB — POCT WET PREP (WET MOUNT)
Clue Cells Wet Prep Whiff POC: NEGATIVE
Trichomonas Wet Prep HPF POC: ABSENT
WBC, Wet Prep HPF POC: NONE SEEN

## 2023-04-06 NOTE — Progress Notes (Unsigned)
    SUBJECTIVE:   CHIEF COMPLAINT / HPI:   Strange mouth sensation 5 days ago, roof of mouth started burning and felt dry. For past 3 days it has felt numb and itching. No swelling of lips and unsure if tongue has swollen. No new foods, no hot liquids.  Recently seen at [facility] and given amoxicillin for dental infection which she started 2 days ago.  Also has tooth pain, cap fell of a bottom R molar - has apt to see dentist.  Denies any recent cough, congestion, rhinorrhea, fever  Vaginal discharge Increase over past week, white in color, and has a fishy odor Would also like STD testing but not RPR and HIV No new sexual partner. Has one partner, does not use condoms.  S/p hysterectomy  PERTINENT  PMH / PSH: None pertinent  OBJECTIVE:   BP 112/80   Pulse 83   Ht 5\' 2"  (1.575 m)   Wt 161 lb (73 kg)   [name] 02/23/2021 (Approximate)   SpO2 100%   BMI 29.45 kg/m    General: NAD, pleasant, able to participate in exam Cardiac: RRR, no murmurs. Respiratory: CTAB, normal effort, No wheezes, rales or rhonchi Abdomen: Bowel sounds present, nontender, nondistended, no hepatosplenomegaly. Extremities: no edema or cyanosis. Skin: warm and dry, no rashes noted Neuro: alert, no obvious focal deficits Psych: Normal affect and mood  ASSESSMENT/PLAN:   No problem-specific Assessment & Plan notes found for this encounter.     Dr. Erick Alley, DO [facility]    {    This will disappear when note is signed, click to select method of visit    :1}

## 2023-04-06 NOTE — Patient Instructions (Signed)
 It was great to see you! Thank you for allowing me to participate in your care!  I recommend that you always bring your medications to each appointment as this makes it easy to ensure you are on the correct medications and helps [place] not miss when refills are needed.  Our plans for today:  - We are checking a B12 (a vitamin) level to further investigate a cause for your mouth sensations  - It could be related to a viral illness or drinking/eating something that was too hot and your nerves are repairing -I recommend you also tell you dentist  We are checking some labs today, I will call you if they are abnormal will send you a MyChart message or a letter if they are normal.  If you do not hear about your labs in the next 2 weeks please let us know.  Take care and seek immediate care sooner if you develop any concerns.   Dr. Erick Alley, DO [facility]

## 2023-04-07 LAB — CERVICOVAGINAL ANCILLARY ONLY
Chlamydia: NEGATIVE
Chlamydia: NEGATIVE
Comment: NEGATIVE
Comment: NORMAL
Comment: NEGATIVE
Comment: NEGATIVE
Comment: NORMAL
Neisseria Gonorrhea: NEGATIVE
Neisseria Gonorrhea: NEGATIVE
Trichomonas: NEGATIVE

## 2023-04-08 ENCOUNTER — Encounter: Payer: Self-pay | Admitting: Student

## 2023-04-08 ENCOUNTER — Ambulatory Visit: Payer: Commercial Managed Care - HMO

## 2023-04-08 DIAGNOSIS — R208 Other disturbances of skin sensation: Secondary | ICD-10-CM | POA: Insufficient documentation

## 2023-04-08 LAB — VITAMIN B12: Vitamin B-12: 726 pg/mL (ref 232–1245)

## 2023-04-08 NOTE — Assessment & Plan Note (Signed)
 No clear etiology on history or exam for numbness/burning of soft and hard palate.  Can consider burning mouth syndrome which typically persists for several months.  Patient denies consumption of very hot food/beverages which could have burned her mouth and denies recent viral symptoms.  -CTM -Will check B12 level

## 2023-04-08 NOTE — Assessment & Plan Note (Signed)
 Increased white in color with fishy odor.  Wet prep negative for BV, yeast and trichomoniasis -Swabs for GC and chlamydia -If swabs negative for STD, can consider treating with metronidazole for possible BV if symptoms persist

## 2023-04-08 NOTE — Assessment & Plan Note (Signed)
 Only concerning symptom is increased discharge with fishy odor.  -Only has 1 partner but has STD testing frequently, plan to discuss/offer PrEP therapy future visit -Vaginal and throat swabs for GC and chlamydia -Patient declines HIV and RPR she was negative last month

## 2023-05-03 ENCOUNTER — Other Ambulatory Visit: Payer: Self-pay | Admitting: Student

## 2023-05-03 DIAGNOSIS — Z1231 Encounter for screening mammogram for malignant neoplasm of breast: Secondary | ICD-10-CM

## 2023-06-09 ENCOUNTER — Ambulatory Visit
Admission: RE | Admit: 2023-06-09 | Discharge: 2023-06-09 | Disposition: A | Discharge status: Home / Self Care | Source: Ambulatory Visit | Attending: *Deleted | Admitting: *Deleted

## 2023-06-09 DIAGNOSIS — Z1231 Encounter for screening mammogram for malignant neoplasm of breast: Secondary | ICD-10-CM

## 2023-06-16 ENCOUNTER — Encounter: Payer: Self-pay | Admitting: Internal Medicine

## 2023-06-21 ENCOUNTER — Ambulatory Visit: Admitting: Family Medicine

## 2023-06-22 ENCOUNTER — Ambulatory Visit: Admitting: Family Medicine

## 2023-06-22 ENCOUNTER — Other Ambulatory Visit (HOSPITAL_COMMUNITY)
Admission: RE | Admit: 2023-06-22 | Discharge: 2023-06-22 | Disposition: A | Discharge status: Home / Self Care | Source: Ambulatory Visit | Attending: Family Medicine | Admitting: Family Medicine

## 2023-06-22 ENCOUNTER — Ambulatory Visit (INDEPENDENT_AMBULATORY_CARE_PROVIDER_SITE_OTHER): Admitting: Family Medicine

## 2023-06-22 VITALS — BP 112/72 | HR 89 | Ht 62.0 in | Wt 159.0 lb

## 2023-06-22 DIAGNOSIS — B9689 Other specified bacterial agents as the cause of diseases classified elsewhere: Secondary | ICD-10-CM | POA: Diagnosis not present

## 2023-06-22 DIAGNOSIS — N760 Acute vaginitis: Secondary | ICD-10-CM

## 2023-06-22 DIAGNOSIS — Z113 Encounter for screening for infections with a predominantly sexual mode of transmission: Secondary | ICD-10-CM | POA: Insufficient documentation

## 2023-06-22 LAB — POCT WET PREP (WET MOUNT)
Clue Cells Wet Prep Whiff POC: POSITIVE
Trichomonas Wet Prep HPF POC: ABSENT
WBC, Wet Prep HPF POC: NONE SEEN

## 2023-06-22 MED ORDER — METRONIDAZOLE 0.75 % VA GEL
1.0000 | Freq: Every day | VAGINAL | 0 refills | Status: DC
Start: 2023-06-22 — End: 2023-06-29

## 2023-06-22 NOTE — Patient Instructions (Addendum)
 Thank you for coming in today! Here is a summary of what we discussed:  -I sent in 5 days of metronidazole  gel. Please complete all 5 days worth  -I recommend always using condoms to prevent STIs  We are checking some labs today. If they are abnormal, I will call you. If they are normal, I will send you a MyChart message (if it is active) or a letter in the mail. If you do not hear about your labs in the next 2 weeks, please call the office.   Please call the clinic at [phone] if your symptoms worsen or you have any concerns.  Best, Dr Sampson Critchley

## 2023-06-22 NOTE — Progress Notes (Cosign Needed Addendum)
    SUBJECTIVE:   CHIEF COMPLAINT / HPI:   STI check  - partners (how many since last visit, has your partner had other partners): 1 female partner, same as before, partner has had other partners since last appointment -sexual practices: vaginal, denies oral or anal -pregnancy prevention: s/p total abd hysterectomy (including cervix) -protection: no condom use -prior STIs: BV per chart review; no syphilis, HIV, GC/CT, trich -syphilis NR in Jan, GC/CT neg in Feb, BV in Feb -some odor and discharge for about 5 days, looks white, some itching -prefers metronidazole  gel -had stomach upset with metronidazole  tablets, last took this in Feb and didn't complete course due to ADEs   PERTINENT  PMH / PSH: GERD, tobacco use, BV, s/p TAH  OBJECTIVE:   BP 112/72   Pulse 89   Ht 5\' 2"  (1.575 m)   Wt 159 lb (72.1 kg)   [name] 02/23/2021 (Approximate)   SpO2 99%   BMI 29.08 kg/m   GEN: Sitting in exam room, pleasant and conversant, no acute distress GU: (Chaperone Saunders, CMA present) normal external female genitalia, thick white copious discharge in vagina.  No vaginal bleeding or lesions noted.  ASSESSMENT/PLAN:   Assessment & Plan Routine screening for STI (sexually transmitted infection) Patient reports that single female partner has had other partners.  Requests HIV, RPR, GC/CT testing today.  Patient is surgically infertile. --Advised condom use --Will follow-up with results. BV (bacterial vaginosis) Wet prep with clue cells.  Patient prefers metronidazole  gel over tablets due to GI ADE's.   --Sent 5 days [employer] .     Naida Austria, MD [facility]

## 2023-06-23 ENCOUNTER — Encounter: Payer: Self-pay | Admitting: Student

## 2023-06-23 LAB — HIV ANTIBODY (ROUTINE TESTING W REFLEX): HIV Screen 4th Generation wRfx: NONREACTIVE

## 2023-06-23 LAB — RPR: RPR Ser Ql: NONREACTIVE

## 2023-06-23 NOTE — Progress Notes (Unsigned)
   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 Sclera anicteric. No rhinorrhea. Cardiovascular: RRR. No M/R/G Respiratory: CTAB, normal WOB on RA. No wheezing, crackles, rhonchi, or diminished breath sounds. Abdomen: Soft, non-tender, non-distended. Bowel sounds normoactive Extremities: Able to move all extremities. No BLE edema, no deformities or significant joint findings. TTP over lower back, upper thoracic, and left trapezius. Pain with flexion, extension, and side-bending R primarily in L trap. Skin: Warm and dry. No abrasions or rashes noted. Neuro: A&Ox3. No focal neurological deficits.  ASSESSMENT/PLAN:   Assessment & Plan Trapezius strain, left, initial encounter Muscle strain likely related to lifting heavy boxes at work.  Exam findings positive for pain with neck flexion, extension, and sidebending to the right.  TTP over upper thoracic vertebrae and left trapezius. - Naproxen  500 mg q12h prn - Advised stretching and strengthening. Discussed lifting with her legs rather than her back - Ice/Heat/Topicals prn - Follow up prn Acute bilateral low back pain without sciatica Pain over the lower back bilaterally with mild TTP.  Denies any urinary retention or incontinence or saddle anesthesia.  - Naproxen  500 mg q12h prn - Advised stretching and  strengthening. Discussed lifting with her legs rather than her back   Clyda Dark, DO [facility]

## 2023-06-24 ENCOUNTER — Ambulatory Visit: Admitting: Family Medicine

## 2023-06-24 ENCOUNTER — Encounter: Payer: Self-pay | Admitting: Family Medicine

## 2023-06-24 VITALS — BP 112/78 | HR 83 | Ht 62.0 in | Wt 159.4 lb

## 2023-06-24 DIAGNOSIS — S46812A Strain of other muscles, fascia and tendons at shoulder and upper arm level, left arm, initial encounter: Secondary | ICD-10-CM | POA: Diagnosis not present

## 2023-06-24 DIAGNOSIS — M545 Low back pain, unspecified: Secondary | ICD-10-CM

## 2023-06-24 MED ORDER — NAPROXEN 500 MG PO TABS: 500.0000 mg | ORAL_TABLET | Freq: Two times a day (BID) | ORAL | 0 refills | Status: DC | PRN

## 2023-06-24 NOTE — Patient Instructions (Addendum)
 It was great to see you today! Thank you for choosing [facility] for your primary care. [name] was seen for low back pain and left trapezius pain.  Today we addressed: Low back pain and left trapezius pain - I have prescribed you naproxen  500 mg which you can take up to twice a day, spaced out every 12 hours as needed Motion is lotion so make sure you are stretching the region When lifting things make sure you left with your legs instead of your back. This will get better with time.  Topical ointments, heat and ice can help as well.  You should return to our clinic Return if symptoms worsen or fail to improve. Please arrive 15 minutes before your appointment to ensure smooth check in process.  We appreciate your efforts in making this happen.  Thank you for allowing me to participate in your care, Clyda Dark, DO 06/24/2023, 2:23 PM PGY-1, [facility]

## 2023-06-28 ENCOUNTER — Ambulatory Visit: Payer: Self-pay | Admitting: Family Medicine

## 2023-06-28 ENCOUNTER — Encounter: Payer: Self-pay | Admitting: Student

## 2023-06-28 LAB — CERVICOVAGINAL ANCILLARY ONLY
Bacterial Vaginitis (gardnerella): POSITIVE — AB
Chlamydia: NEGATIVE
Comment: NEGATIVE
Comment: NEGATIVE
Comment: NEGATIVE
Comment: NEGATIVE
Comment: NEGATIVE
Comment: NORMAL
Neisseria Gonorrhea: NEGATIVE

## 2023-06-29 ENCOUNTER — Other Ambulatory Visit: Payer: Self-pay | Admitting: Family Medicine

## 2023-06-29 MED ORDER — METRONIDAZOLE 500 MG PO TABS: 500.0000 mg | ORAL_TABLET | Freq: Two times a day (BID) | ORAL | 0 refills | Status: AC

## 2023-07-08 ENCOUNTER — Ambulatory Visit (HOSPITAL_COMMUNITY): Payer: Self-pay

## 2023-07-12 ENCOUNTER — Other Ambulatory Visit (HOSPITAL_COMMUNITY)
Admission: RE | Admit: 2023-07-12 | Discharge: 2023-07-12 | Disposition: A | Discharge status: Home / Self Care | Source: Ambulatory Visit | Attending: Family Medicine | Admitting: Family Medicine

## 2023-07-12 ENCOUNTER — Ambulatory Visit (INDEPENDENT_AMBULATORY_CARE_PROVIDER_SITE_OTHER): Admitting: Student

## 2023-07-12 ENCOUNTER — Encounter: Payer: Self-pay | Admitting: Student

## 2023-07-12 VITALS — BP 108/79 | HR 81 | Ht 62.0 in | Wt 164.2 lb

## 2023-07-12 DIAGNOSIS — N898 Other specified noninflammatory disorders of vagina: Secondary | ICD-10-CM | POA: Diagnosis not present

## 2023-07-12 DIAGNOSIS — Z0000 Encounter for general adult medical examination without abnormal findings: Secondary | ICD-10-CM | POA: Diagnosis not present

## 2023-07-12 DIAGNOSIS — Z791 Long term (current) use of non-steroidal anti-inflammatories (NSAID): Secondary | ICD-10-CM

## 2023-07-12 DIAGNOSIS — Z113 Encounter for screening for infections with a predominantly sexual mode of transmission: Secondary | ICD-10-CM

## 2023-07-12 LAB — POCT GLYCOSYLATED HEMOGLOBIN (HGB A1C): Hemoglobin A1C: 5.3 % (ref 4.0–5.6)

## 2023-07-12 LAB — POCT WET PREP (WET MOUNT)
Clue Cells Wet Prep Whiff POC: NEGATIVE
Trichomonas Wet Prep HPF POC: ABSENT
WBC, Wet Prep HPF POC: NONE SEEN

## 2023-07-12 MED ORDER — FLUCONAZOLE 150 MG PO TABS: 150.0000 mg | ORAL_TABLET | Freq: Once | ORAL | 0 refills | Status: AC

## 2023-07-12 MED ORDER — METRONIDAZOLE 0.75 % VA GEL
1.0000 | Freq: Every day | VAGINAL | 0 refills | Status: AC
Start: 2023-07-12 — End: 2023-07-17

## 2023-07-12 NOTE — Progress Notes (Signed)
    SUBJECTIVE:   Chief compliant/HPI: annual examination  [name] is a 43 y.o. who presents today for an annual exam.   She is concerned about previous recent STI test results, particularly the inconsistency in trichomonas reporting though it appears to have been negative. Despite negative wet prep and culture results, she seeks further testing for reassurance.  She has had the same sexual partner for a long time, did not use condoms, and has not had a new partner since her last STI testing two weeks ago. She would like vaginal and throat swabs.  She does not want to repeat HIV or RPR testing. She underwent a hysterectomy in 2023.   She was recently diagnosed with BV but cannot tolerate the oral metronidazole  pills and requests MetroGel .  She also states she has had increased white discharge and irritation which may be a yeast infection.  She is interested in checking her A1c and cholesterol levels due to a family history of diabetes and wants to check her liver and kidney function.  OBJECTIVE:   BP 108/79   Pulse 81   Ht 5\' 2"  (1.575 m)   Wt 164 lb 3.2 oz (74.5 kg)   [name] 02/23/2021 (Approximate)   SpO2 100%   BMI 30.03 kg/m    General: Well-appearing, NAD Cardio: RRR, normal S1/S2 Lungs: CTAB, normal effort Abdomen: Soft, nontender, nondistended GU: Chaperoned by CMA.  Normal external female genitalia, pink moist vaginal mucosa, normal-appearing cervix, copious amounts of thick white discharge Neuro: Alert, no focal deficits Psych: Mood affect appropriate   ASSESSMENT/PLAN:   Vaginal discharge Wet prep negative but due to symptoms concerning for yeast infection and recent positive test for BV, will treat for both per patient preference. - Rx MetroGel  - Rx Diflucan   Screen for STD (sexually transmitted disease) Wet prep negative for trichomonas Vaginal and throat swabs ordered for gonorrhea and chlamydia Recently had negative HIV and RPR tests, will not repeat  today    Annual Examination  See AVS for age appropriate recommendations.   PHQ score 6, negative for #9 Blood pressure reviewed and at goal .  Asked about intimate partner violence and resources given as appropriate   Considered the following items based upon USPSTF recommendations: Diabetes screening: ordered Screening for elevated cholesterol: last checked in 2021 and normal HIV testing: neg 06/22/2023 Hepatitis C: neg 02/24/2023 Syphilis if at high risk: neg 06/22/2023 CMP ordered to monitor electrolytes and liver function given chronic NSAID use  Cervical cancer screening: no longer needed given s/p hysterectomy Breast cancer screening: UTD   Follow up in 1  year or sooner if indicated.    Glenn Lange, DO [facility]

## 2023-07-12 NOTE — Patient Instructions (Signed)
 It was great to see you! Thank you for allowing me to participate in your care!  I recommend that you always bring your medications to each appointment as this makes it easy to ensure you are on the correct medications and helps us  not miss when refills are needed.  Our plans for today:  - I will send a mychart message today with wet prep results.If positive for BV,  I will send in metrogel   - Return in 1 year for next annual exam or sooner if needed   We are checking some labs today, I will call you if they are abnormal will send you a MyChart message or a letter if they are normal.  If you do not hear about your labs in the next 2 weeks please let us  know.  Take care and seek immediate care sooner if you develop any concerns.   Dr. Glenn Lange, DO [facility]

## 2023-07-13 ENCOUNTER — Ambulatory Visit: Payer: Self-pay | Admitting: Student

## 2023-07-13 ENCOUNTER — Encounter: Payer: Self-pay | Admitting: Student

## 2023-07-13 DIAGNOSIS — Z113 Encounter for screening for infections with a predominantly sexual mode of transmission: Secondary | ICD-10-CM | POA: Insufficient documentation

## 2023-07-13 LAB — COMPREHENSIVE METABOLIC PANEL WITH GFR
ALT: 11 IU/L (ref 0–32)
AST: 15 IU/L (ref 0–40)
Albumin: 4.2 g/dL (ref 3.9–4.9)
Alkaline Phosphatase: 75 IU/L (ref 44–121)
BUN/Creatinine Ratio: 22 (ref 9–23)
BUN: 17 mg/dL (ref 6–24)
Bilirubin Total: <0.2 mg/dL (ref 0.0–1.2)
CO2: 24 mmol/L (ref 20–29)
Calcium: 9.4 mg/dL (ref 8.7–10.2)
Chloride: 104 mmol/L (ref 96–106)
Creatinine, Ser: 0.79 mg/dL (ref 0.57–1.00)
Globulin, Total: 2.4 g/dL (ref 1.5–4.5)
Glucose: 71 mg/dL (ref 70–99)
Potassium: 4.2 mmol/L (ref 3.5–5.2)
Sodium: 139 mmol/L (ref 134–144)
Total Protein: 6.6 g/dL (ref 6.0–8.5)
eGFR: 95 mL/min/{1.73_m2} (ref >59)

## 2023-07-13 LAB — LIPID PANEL
Chol/HDL Ratio: 2.1 ratio (ref 0.0–4.4)
Cholesterol, Total: 135 mg/dL (ref 100–199)
HDL: 65 mg/dL (ref >39)
LDL Chol Calc (NIH): 54 mg/dL (ref 0–99)
Triglycerides: 80 mg/dL (ref 0–149)
VLDL Cholesterol Cal: 16 mg/dL (ref 5–40)

## 2023-07-13 LAB — CERVICOVAGINAL ANCILLARY ONLY
Chlamydia: NEGATIVE
Chlamydia: NEGATIVE
Comment: NEGATIVE
Comment: NEGATIVE
Comment: NORMAL
Comment: NORMAL
Neisseria Gonorrhea: NEGATIVE
Neisseria Gonorrhea: NEGATIVE

## 2023-07-13 NOTE — Assessment & Plan Note (Signed)
 Wet prep negative for trichomonas Vaginal and throat swabs ordered for gonorrhea and chlamydia Recently had negative HIV and RPR tests, will not repeat today

## 2023-07-13 NOTE — Assessment & Plan Note (Signed)
 Wet prep negative but due to symptoms concerning for yeast infection and recent positive test for BV, will treat for both per patient preference. - Rx MetroGel  - Rx Diflucan 

## 2023-07-20 ENCOUNTER — Encounter: Payer: Self-pay | Admitting: *Deleted

## 2023-07-26 ENCOUNTER — Encounter (HOSPITAL_COMMUNITY): Payer: Self-pay

## 2023-07-26 ENCOUNTER — Ambulatory Visit (HOSPITAL_COMMUNITY)
Admission: RE | Admit: 2023-07-26 | Discharge: 2023-07-26 | Disposition: A | Discharge status: Home / Self Care | Source: Ambulatory Visit | Attending: Emergency Medicine | Admitting: Emergency Medicine

## 2023-07-26 VITALS — BP 126/77 | HR 96 | Temp 98.3°F | Resp 18

## 2023-07-26 DIAGNOSIS — N898 Other specified noninflammatory disorders of vagina: Secondary | ICD-10-CM | POA: Insufficient documentation

## 2023-07-26 DIAGNOSIS — Z113 Encounter for screening for infections with a predominantly sexual mode of transmission: Secondary | ICD-10-CM | POA: Insufficient documentation

## 2023-07-26 DIAGNOSIS — R109 Unspecified abdominal pain: Secondary | ICD-10-CM | POA: Diagnosis present

## 2023-07-26 LAB — POCT URINALYSIS DIP (MANUAL ENTRY)
Glucose, UA: NEGATIVE mg/dL
Leukocytes, UA: NEGATIVE
Nitrite, UA: NEGATIVE
Protein Ur, POC: 100 mg/dL — AB
Spec Grav, UA: >=1.03 — AB (ref 1.010–1.025)
Urobilinogen, UA: 1 U/dL
pH, UA: 6 (ref 5.0–8.0)

## 2023-07-26 NOTE — ED Provider Notes (Signed)
 [facility]    CSN: [id number] Arrival date & time: 07/26/23  1333      History   Chief Complaint Chief Complaint  Patient presents with   Abdominal Pain    STD screening and a UTI  screening - Entered by patient    HPI [name] is a 43 y.o. female.   Patient presents with lower mid abdominal cramping, lower bilateral back pain, and abnormal vaginal discharge x 3 to 4 days.  Denies dysuria, hematuria, urinary urgency/frequency, and abnormal vaginal bleeding.  Patient is requesting STD testing due to recent unprotected intercourse.  Patient denies any known exposures to STDs.  Patient had a hysterectomy in 2023.  The history is provided by the patient and medical records.  Abdominal Pain   Past Medical History:  Diagnosis Date   Adhesions of adnexa of uterus due to endometriosis 11/12/2021   Arthritis    left ankle   COVID-19 09/24/2020   flu like symptoms per pt   Endometriosis    w/abnormal uterine bleeding, Pt follows with Dr. Shawnie Delton, LOV 08/27/21 as of 10/22/21.   Ganglion cyst of foot    left foot, hx of aspiration, Follows with Dr. Lavanda Porter, Sports Medicine, lov 10/06/21   GERD (gastroesophageal reflux disease)    occasional with spicy foods   History of 2019 novel coronavirus disease (COVID-19)    pt tested positive 03-03-2019, results in care everywhere.  (05-22-2019 per pt had symptoms of fatigue, headache, and loss of taste/ smell;  fatigue and headache after one week and loss taste/ smell resolved after 3 wks)   Hydrosalpinx 12/30/2018   10x4x5cm right hydrosalpinx  Also with 2 small fibroids and left hemorrhagic cyst  Offered obs vs surgical intervention. Pt desires surgery  Move to [place] R salpingectomy     Infertility, female    Menometrorrhagia 09/21/2006   Qualifier: Diagnosis of   By: Katheleen Palmer MD, [place]         Menorrhagia 12/25/2019   Ovarian cyst    Pelvic mass in female    large fluid filled pelvic mass   Recurrent  boils    Septate uterus    SEPTATE UTERUS 07/24/2009   Qualifier: Diagnosis of   By: Gaylyn Keas FNP, [place]         Trichomoniasis    hx of   Viral labyrinthitis 06/20/2007   Qualifier: Diagnosis of   By: Katheleen Palmer MD, [place]          Patient Active Problem List   Diagnosis Date Noted   Screen for STD (sexually transmitted disease) 07/13/2023   Burning sensation of mouth 04/08/2023   S/P TAH (total abdominal hysterectomy) 02/24/2023   Pelvic pain in female 11/11/2021   Pain of joint of left ankle and foot 10/06/2021   Fatigue 09/01/2021   Molluscum contagiosum 12/19/2020   Abdominal pain 08/16/2020   Constipation 08/16/2020   Hydradenitis 06/24/2020   Ganglion cyst of left foot 06/13/2020   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   Vaginal discharge 07/08/2019   GERD (gastroesophageal reflux disease) 06/07/2019   HSV-2 seropositive 03/07/2019   TOBACCO ABUSE 09/26/2008   Female infertility 09/26/2008    Past Surgical History:  Procedure Laterality Date   BREAST BIOPSY Right 1999   per pt benign    CYSTOSCOPY N/A 11/11/2021   Procedure: CYSTOSCOPY;  Surgeon: Lenord Radon, MD;  Location: [facility];  Service: Gynecology;  Laterality: N/A;   DILITATION & CURRETTAGE/HYSTROSCOPY WITH NOVASURE ABLATION N/A 05/08/2021   Procedure:  HYSTEROSCOPY, DILATATION & CURETTAGE, ENDOMETRIAL ABLATION WITH NOVASURE;  Surgeon: Lenord Radon, MD;  Location: [facility];  Service: Gynecology;  Laterality: N/A;   INCISION AND DRAINAGE     right axilla, 2019, 2020, 2022, 2023   LAPAROSCOPIC SALPINGOOPHERECTOMY Right 03-25-2010   @[facility]    and LYSIS ADHESIONS   LAPAROSCOPY N/A 05/30/2019   Procedure: DIAGNOSTIC LAPAROSCOPY;  Surgeon: Tresia Fruit, MD;  Location: [facility];  Service: Gynecology;  Laterality: N/A;   ROBOTIC ASSISTED TOTAL HYSTERECTOMY N/A 11/11/2021   Procedure: XI ROBOTIC ASSISTED TOTAL HYSTERECTOMY WITH LEFT SALPINGECTOMY;  Surgeon: Lenord Radon, MD;  Location: [facility];  Service: Gynecology;  Laterality: N/A;    OB History     Gravida  0   Para      Term      Preterm      AB      Living  0      SAB      IAB      Ectopic      Multiple      Live Births               Home Medications    Prior to Admission medications   Medication Sig Start Date End Date Taking? Authorizing Provider  Multiple Vitamin (MULTIVITAMIN WITH MINERALS) TABS tablet Take 1 tablet by mouth daily.    [provider]  naproxen  (NAPROSYN ) 500 MG tablet Take 1 tablet (500 mg total) by mouth every 12 (twelve) hours as needed. 06/24/23   Gomes, Adriana, DO  Norethindrone  Acetate-Ethinyl Estrad-FE (LOESTRIN 24 FE) 1-20 MG-MCG(24) tablet Take 1 tablet by mouth daily. 03/21/20 05/28/20  Lenord Radon, MD  Norgestimate -Ethinyl Estradiol  Triphasic (TRI-SPRINTEC) 0.18/0.215/0.25 MG-35 MCG tablet Take 1 tablet by mouth daily. 11/03/10 05/01/11  Tresia Fruit, MD    Family History Family History  Problem Relation Age of Onset   Hypertension Mother    Diabetes Mother    Cancer Father    Hypertension Father    Diabetes Brother    Hypertension Brother    Breast cancer Neg Hx    BRCA 1/2 Neg Hx     Social History Social History   Tobacco Use   Smoking status: Every Day    Current packs/day: 0.50    Average packs/day: 0.5 packs/day for 20.0 years (10.0 ttl pk-yrs)    Types: Cigarettes   Smokeless tobacco: Never  Vaping Use   Vaping status: Never Used  Substance Use Topics   Alcohol use: Yes    Comment: occasionally on weekends   Drug use: Never     Allergies   Sulfa  antibiotics   Review of Systems Review of Systems  Gastrointestinal:  Positive for abdominal pain.   Per HPI  Physical Exam Triage Vital Signs ED Triage Vitals [07/26/23 1407]  Encounter Vitals Group     BP 126/77     Girls Systolic BP Percentile      Girls Diastolic BP Percentile      Boys Systolic BP  Percentile      Boys Diastolic BP Percentile      Pulse Rate 96     Resp 18     Temp 98.3 F (36.8 C)     Temp Source Oral     SpO2 99 %     Weight      Height      Head Circumference      Peak Flow      Pain Score  5     Pain Loc      Pain Education      Exclude from Growth Chart    No data found.  Updated Vital Signs BP 126/77 (BP Location: Left Arm)   Pulse 96   Temp 98.3 F (36.8 C) (Oral)   Resp 18   [name] 02/23/2021 (Approximate)   SpO2 99%   Visual Acuity Right Eye Distance:   Left Eye Distance:   Bilateral Distance:    Right Eye Near:   Left Eye Near:    Bilateral Near:     Physical Exam Vitals and nursing note reviewed.  Constitutional:      General: She is awake. She is not in acute distress.    Appearance: Normal appearance. She is well-developed and well-groomed. She is not ill-appearing.  Abdominal:     General: Abdomen is flat. Bowel sounds are normal.     Palpations: Abdomen is soft.     Tenderness: There is abdominal tenderness in the suprapubic area. There is no right CVA tenderness, left CVA tenderness, guarding or rebound.  Genitourinary:    Comments: Exam deferred  Skin:    General: Skin is warm and dry.   Neurological:     Mental Status: She is alert.   Psychiatric:        Behavior: Behavior is cooperative.      UC Treatments / Results  Labs (all labs ordered are listed, but only abnormal results are displayed) Labs Reviewed  POCT URINALYSIS DIP (MANUAL ENTRY) - Abnormal; Notable for the following components:      Result Value   Clarity, UA cloudy (*)    Bilirubin, UA small (*)    Ketones, POC UA trace (5) (*)    Spec Grav, UA >=1.030 (*)    Blood, UA trace-intact (*)    Protein Ur, POC =100 (*)    All other components within normal limits  URINE CULTURE  CERVICOVAGINAL ANCILLARY ONLY    EKG   Radiology No results found.  Procedures Procedures (including critical care time)  Medications Ordered in UC Medications  - No data to display  Initial Impression / Assessment and Plan / UC Course  I have reviewed the triage vital signs and the nursing notes.  Pertinent labs & imaging results that were available during my care of the patient were reviewed by me and considered in my medical decision making (see chart for details).     Patient is well-appearing.  Vitals are stable.  Upon assessment mild suprapubic tenderness noted.  Without CVA tenderness.  Urinalysis does not reveal any obvious signs of infection, will send urine culture to confirm.  GU exam deferred.  Patient form self swab for STD/STI.  Patient declines HIV and RPR.  Discussed follow-up and return precautions. Final Clinical Impressions(s) / UC Diagnoses   Final diagnoses:  Vaginal discharge  Screening for STD (sexually transmitted disease)  Abdominal cramping     Discharge Instructions      Your results will come back over the next few days and someone will call if results are positive and require treatment.  Return here as needed.     ED Prescriptions   None    PDMP not reviewed this encounter.   Levora Reas A, NP 07/26/23 1444

## 2023-07-26 NOTE — Discharge Instructions (Signed)
 Your results will come back over the next few days and someone will call if results are positive and require treatment.  Return here as needed.

## 2023-07-26 NOTE — ED Triage Notes (Signed)
 Pt present with c/o  back pain, mid lower abd pain and vaginal discharge x 3-4 days. Pt states she wants STD testing.

## 2023-07-27 ENCOUNTER — Ambulatory Visit (INDEPENDENT_AMBULATORY_CARE_PROVIDER_SITE_OTHER): Admitting: Student

## 2023-07-27 ENCOUNTER — Ambulatory Visit: Payer: Self-pay | Admitting: Student

## 2023-07-27 VITALS — BP 116/80 | HR 80 | Temp 98.4°F | Ht 62.0 in | Wt 159.0 lb

## 2023-07-27 DIAGNOSIS — R42 Dizziness and giddiness: Secondary | ICD-10-CM | POA: Diagnosis not present

## 2023-07-27 DIAGNOSIS — R8290 Unspecified abnormal findings in urine: Secondary | ICD-10-CM

## 2023-07-27 DIAGNOSIS — R1030 Lower abdominal pain, unspecified: Secondary | ICD-10-CM

## 2023-07-27 LAB — CERVICOVAGINAL ANCILLARY ONLY
Bacterial Vaginitis (gardnerella): POSITIVE — AB
Candida Glabrata: NEGATIVE
Candida Vaginitis: NEGATIVE
Chlamydia: NEGATIVE
Comment: NEGATIVE
Comment: NEGATIVE
Comment: NEGATIVE
Comment: NEGATIVE
Comment: NEGATIVE
Comment: NORMAL
Neisseria Gonorrhea: NEGATIVE
Trichomonas: NEGATIVE

## 2023-07-27 LAB — POCT URINALYSIS DIP (MANUAL ENTRY)
Bilirubin, UA: NEGATIVE
Blood, UA: NEGATIVE
Glucose, UA: NEGATIVE mg/dL
Ketones, POC UA: NEGATIVE mg/dL
Nitrite, UA: NEGATIVE
Protein Ur, POC: NEGATIVE mg/dL
Spec Grav, UA: 1.025 (ref 1.010–1.025)
Urobilinogen, UA: 0.2 U/dL
pH, UA: 6 (ref 5.0–8.0)

## 2023-07-27 LAB — POCT UA - MICROSCOPIC ONLY

## 2023-07-27 LAB — URINE CULTURE: Culture: NO GROWTH

## 2023-07-27 MED ORDER — METRONIDAZOLE 0.75 % VA GEL: 1.0000 | Freq: Every day | VAGINAL | 0 refills | Status: AC

## 2023-07-27 NOTE — Progress Notes (Signed)
 SUBJECTIVE:   CHIEF COMPLAINT / HPI:   Abdominal Pain  Dizziness  Possible Dehydration Symptoms been present for 2 days now.  She was actually seen at urgent care just yesterday and had a UA that showed an elevated specific gravity, small bilirubin and trace intact blood but no evidence of infection with negative nitrites and leukocytes.  Urgent care provider suggested that she may be dehydrated versus have a kidney stone as she was having concomitant abdominal pain.  Urgent care workup was also significant for a vaginal swab positive for BV for which she is being treated with metronidazole .  She was urged to follow-up with us  here today. Today she tells me that she has had abdominal pain that did start in her low back a few days ago but her low back is no longer bothering her.  She is now having some lower abdominal pain that she describes as cramping. She does endorse drinking less water  now that school is now.  She works in [employer] at [employer] and tells me that she is better about drinking water  when she is at work than she is now.  She denies any fever, chills.  Denies any dysuria.  Her dizziness is worse upon standing, not really having any at rest in the exam room at this time. Perhaps had a mild headache yesterday, but none at present.   She has a history of an abdominal hysterectomy, so no chance of pregnancy.  No chest pain or shortness of breath.  OBJECTIVE:   BP 116/80   Pulse 80   Temp 98.4 F (36.9 C) (Oral)   Ht 5' 2 (1.575 m)   Wt 159 lb (72.1 kg)   [name] 02/23/2021 (Approximate)   SpO2 100%   BMI 29.08 kg/m   General: Well appearing in NAD HEENT: Mucous membranes are moist, EOMs are intact Cardio: Regular rate, regular rhythm, without murmur Pulm: Normal work of breathing on room air, lungs are clear to auscultation in all fields Abdomen: Without CVA tenderness bilaterally.  There is mild suprapubic tenderness to palpation but abdomen is  otherwise benign. Neuro: Bilateral fundoscopy without papilledema, sharp cup-disc margins bilaterally. Cranial nerves II through XII are intact, strength and sensation is intact throughout, gait is normal.  Speech is fluent.  ASSESSMENT/PLAN:   Assessment & Plan Lower abdominal pain Rather mild at this point.  With trace blood in her UA yesterday it is certainly possible that she has or had a kidney stone.  Repeat UA today is benign with just trace leukocytes.  She may have passed a stone if that was indeed the culprit.  No chance of pregnancy with history of abdominal hysterectomy.  With her benign abdominal exam today, do not see an indication to jump to imaging right away. -Increase fluid intake as above, return precautions given for worsening of pain, development of fever, failure to improve Dizziness The constellation of symptoms that she was having taken together with a urinalysis yesterday suggestive of dehydration lead me to think that her symptoms may all stem from dehydration, likely related to poor fluid intake now that her routine has changed with the end of the school year.  We discussed pushing fluids at home and returning to care if symptoms fail to improve.  She has a benign neuro exam today, nothing at present to suggest a primary neurologic cause of her dizziness as this would also fail to explain the abdominal pain.  Reassuringly, her urine specific gravity  is improved on today's recollection.  Her mucous membranes are moist and she appears well-hydrated to my exam today. -Push fluids at home, return to care if failing to improve -CBC, TSH, BMP, Mag today     J Lark Plum, MD [facility]

## 2023-07-27 NOTE — Patient Instructions (Signed)
 [name],  Your urine dipstick didn't show blood today. We are still going to look at it under the microscope and I'll call you if we need to make any changes.  I want you drinking more water  (aim for 2.5 liters per day.) We can check some basic labs today and then see you back next week.  Alexa Andrews, MD

## 2023-07-28 ENCOUNTER — Ambulatory Visit: Admitting: Family Medicine

## 2023-07-28 ENCOUNTER — Ambulatory Visit: Payer: Self-pay | Admitting: Student

## 2023-07-28 VITALS — BP 110/88 | HR 90 | Ht 62.0 in | Wt 156.2 lb

## 2023-07-28 DIAGNOSIS — R1030 Lower abdominal pain, unspecified: Secondary | ICD-10-CM

## 2023-07-28 LAB — BASIC METABOLIC PANEL WITH GFR
BUN/Creatinine Ratio: 13 (ref 9–23)
BUN: 10 mg/dL (ref 6–24)
CO2: 21 mmol/L (ref 20–29)
Calcium: 9.8 mg/dL (ref 8.7–10.2)
Chloride: 100 mmol/L (ref 96–106)
Creatinine, Ser: 0.78 mg/dL (ref 0.57–1.00)
Glucose: 76 mg/dL (ref 70–99)
Potassium: 4.3 mmol/L (ref 3.5–5.2)
Sodium: 137 mmol/L (ref 134–144)
eGFR: 96 mL/min/{1.73_m2} (ref >59)

## 2023-07-28 LAB — CBC
Hematocrit: 45 % (ref 34.0–46.6)
Hemoglobin: 14.2 g/dL (ref 11.1–15.9)
MCH: 28.8 pg (ref 26.6–33.0)
MCHC: 31.6 g/dL (ref 31.5–35.7)
MCV: 91 fL (ref 79–97)
Platelets: 338 10*3/uL (ref 150–450)
RBC: 4.93 x10E6/uL (ref 3.77–5.28)
RDW: 14.1 % (ref 11.7–15.4)
WBC: 5.5 10*3/uL (ref 3.4–10.8)

## 2023-07-28 LAB — TSH RFX ON ABNORMAL TO FREE T4: TSH: 1.29 u[IU]/mL (ref 0.450–4.500)

## 2023-07-28 LAB — MAGNESIUM: Magnesium: 1.9 mg/dL (ref 1.6–2.3)

## 2023-07-28 MED ORDER — ONDANSETRON 4 MG PO TBDP: 4.0000 mg | ORAL_TABLET | Freq: Three times a day (TID) | ORAL | 0 refills | Status: DC | PRN

## 2023-07-28 NOTE — Patient Instructions (Signed)
 It was wonderful to see you today! Thank you for choosing [facility].   Please bring ALL of your medications with you to every visit.   Today we talked about:  I am ordering a Stat abdominal ultrasound to be done at [facility] imaging.  We will get it done as soon as possible and they will read it pretty quickly.  I will follow-up with you regarding these results when they are available.  Please keep in mind if your abdominal pain significantly worsens please consider being seen more urgently.   Call the clinic at [phone] if your symptoms worsen or you have any concerns.  Please be sure to schedule follow up at the front desk before you leave today.   Jonne Netters, DO Family Medicine

## 2023-07-28 NOTE — Assessment & Plan Note (Signed)
 Unclear etiology, reviewed prior lab testing from visit yesterday which showed normal CBC, BMP, TSH and urine studies.  Low concern for nephrolithiasis, UTI and pyelonephritis given normal UA and microscopy.  Vitals and exam reassuring today, lower concern for urgent intra-abdominal process but given persistence of symptoms will obtain stat imaging.  Also possibly hormonal given component of night sweats although acute onset less convincing.  Normal SCr and no other signs of dehydration, encourage patient to continue to stay well-hydrated. - Stat abdominal ultrasound, scheduled for tomorrow -Zofran  4 mg ODT every 8 hour as needed for nausea management

## 2023-07-28 NOTE — Progress Notes (Signed)
 abdo   SUBJECTIVE:   CHIEF COMPLAINT / HPI:   Lower abdominal pain, dizziness, nausea, night sweats Ongoing for the past week.  Has been seen at urgent care and found to have BV for which she was treated.  Additionally seen in the office yesterday and had blood work obtained. Felt to be secondary to dehydration.  BMP, CBC, TSH, mag all normal.  Reports that she continues to have symptoms.  Reports the night sweats are causing her to change her close 2-3 times per night.  Has a history of hysterectomy but did not have night sweats prior.  Reports she is also having persistent nausea and feeling like the room is spinning.  Denies vomiting and diarrhea.  Denies daytime fevers.  Denies sick contacts.  Reports she has been trying to drink excess of water  to stay well-hydrated.  Denies chest pain and shortness of breath.  Feels she does have some fullness and pressure in her sinuses.  Denies cough, congestion and sputum production.  Recent BM.  Denies urinary symptoms.  PERTINENT  PMH / PSH: GERD  OBJECTIVE:   BP 110/88   Pulse 90   Ht 5' 2 (1.575 m)   Wt 156 lb 3.2 oz (70.9 kg)   [name] 02/23/2021 (Approximate)   SpO2 99%   BMI 28.57 kg/m    General: NAD, pleasant, able to participate in exam HEENT: Right TM with small amount of fluid behind it without erythema or bulging.  Left TM clear.  Moist mucous membranes.  Nonerythematous pharynx.  No palpable cervical lymphadenopathy. Cardiac: RRR, no murmurs. Respiratory: CTAB, normal effort, No wheezes, rales or rhonchi Abdomen: Bowel sounds present, soft, nondistended.  Mild tenderness in the lower quadrants.  No rebound tenderness or guarding. Extremities: no edema or cyanosis. Skin: warm and dry, no rashes noted Neuro: alert, no obvious focal deficits Psych: Normal affect and mood  ASSESSMENT/PLAN:   Assessment & Plan Lower abdominal pain Unclear etiology, reviewed prior lab testing from visit yesterday which showed normal CBC, BMP, TSH  and urine studies.  Low concern for nephrolithiasis, UTI and pyelonephritis given normal UA and microscopy.  Vitals and exam reassuring today, lower concern for urgent intra-abdominal process but given persistence of symptoms will obtain stat imaging.  Also possibly hormonal given component of night sweats although acute onset less convincing.  Normal SCr and no other signs of dehydration, encourage patient to continue to stay well-hydrated. - Stat abdominal ultrasound, scheduled for tomorrow -Zofran  4 mg ODT every 8 hour as needed for nausea management     Dr. Jonne Netters, DO [facility]

## 2023-07-28 NOTE — Assessment & Plan Note (Signed)
 Rather mild at this point.  With trace blood in her UA yesterday it is certainly possible that she has or had a kidney stone.  Repeat UA today is benign with just trace leukocytes.  She may have passed a stone if that was indeed the culprit.  No chance of pregnancy with history of abdominal hysterectomy.  With her benign abdominal exam today, do not see an indication to jump to imaging right away. -Increase fluid intake as above, return precautions given for worsening of pain, development of fever, failure to improve

## 2023-07-29 ENCOUNTER — Ambulatory Visit (HOSPITAL_BASED_OUTPATIENT_CLINIC_OR_DEPARTMENT_OTHER): Payer: Self-pay | Admitting: Family Medicine

## 2023-07-29 ENCOUNTER — Ambulatory Visit
Admission: RE | Admit: 2023-07-29 | Discharge: 2023-07-29 | Disposition: A | Discharge status: Home / Self Care | Source: Ambulatory Visit | Attending: Family Medicine | Admitting: Family Medicine

## 2023-07-29 DIAGNOSIS — R1030 Lower abdominal pain, unspecified: Secondary | ICD-10-CM

## 2023-07-29 NOTE — Telephone Encounter (Cosign Needed)
 Attempted to contact patient regarding abdominal ultrasound results, left voicemail.  Results unremarkable for any acute pathology.  Trace biliary sludge, likely not contributing to clinical picture.  Recommend patient follow-up in the clinic as scheduled and if persistent symptoms can discuss further.  Will message patient via MyChart.  Jonne Netters, DO

## 2023-08-05 ENCOUNTER — Ambulatory Visit: Payer: Self-pay | Admitting: Student

## 2023-09-24 ENCOUNTER — Encounter: Payer: Self-pay | Admitting: Family Medicine

## 2023-10-08 ENCOUNTER — Ambulatory Visit: Admitting: Student

## 2023-10-12 ENCOUNTER — Ambulatory Visit

## 2023-10-14 ENCOUNTER — Other Ambulatory Visit (HOSPITAL_COMMUNITY)
Admission: RE | Admit: 2023-10-14 | Discharge: 2023-10-14 | Disposition: A | Discharge status: Home / Self Care | Source: Ambulatory Visit | Attending: Family Medicine | Admitting: Family Medicine

## 2023-10-14 ENCOUNTER — Ambulatory Visit: Payer: Self-pay | Admitting: Family Medicine

## 2023-10-14 ENCOUNTER — Ambulatory Visit (INDEPENDENT_AMBULATORY_CARE_PROVIDER_SITE_OTHER): Admitting: Family Medicine

## 2023-10-14 ENCOUNTER — Encounter: Payer: Self-pay | Admitting: Family Medicine

## 2023-10-14 VITALS — BP 98/64 | HR 87 | Wt 158.0 lb

## 2023-10-14 DIAGNOSIS — N898 Other specified noninflammatory disorders of vagina: Secondary | ICD-10-CM | POA: Diagnosis not present

## 2023-10-14 DIAGNOSIS — Z113 Encounter for screening for infections with a predominantly sexual mode of transmission: Secondary | ICD-10-CM | POA: Insufficient documentation

## 2023-10-14 DIAGNOSIS — L732 Hidradenitis suppurativa: Secondary | ICD-10-CM

## 2023-10-14 LAB — POCT WET PREP (WET MOUNT)
Clue Cells Wet Prep Whiff POC: POSITIVE
Trichomonas Wet Prep HPF POC: ABSENT

## 2023-10-14 MED ORDER — METRONIDAZOLE 500 MG PO TABS: 500.0000 mg | ORAL_TABLET | Freq: Two times a day (BID) | ORAL | 0 refills | Status: DC

## 2023-10-14 MED ORDER — DOXYCYCLINE HYCLATE 100 MG PO TABS: 100.0000 mg | ORAL_TABLET | Freq: Two times a day (BID) | ORAL | 0 refills | Status: DC

## 2023-10-14 MED ORDER — METRONIDAZOLE 0.75 % VA GEL: 1.0000 | Freq: Every day | VAGINAL | 0 refills | Status: DC

## 2023-10-14 MED ORDER — FLUCONAZOLE 150 MG PO TABS: 150.0000 mg | ORAL_TABLET | Freq: Once | ORAL | 0 refills | Status: AC

## 2023-10-14 NOTE — Progress Notes (Signed)
    SUBJECTIVE:   CHIEF COMPLAINT / HPI:   Vaginal discharge -Increased white discharge x 1 week -Somewhat itchy -Had total hysterectomy years ago, not on BC   HS -Reports vulvar boil x4 days -Chronic boils that come and go in armpits, vulvar region   PERTINENT  PMH / PSH: HS, TAH  OBJECTIVE:   BP 98/64   Pulse 87   Wt 158 lb (71.7 kg)   [name] 02/23/2021 (Approximate)   SpO2 98%   BMI 28.90 kg/m   General: Well-appearing. Resting comfortably in room. CV: Normal S1/S2. No extra heart sounds. Warm and well-perfused. Pulm: Breathing comfortably on room air. CTAB. No increased WOB. Abd: Soft, non-tender, non-distended. Skin:  Warm, dry. Bilateral armpit scarring.  GU: Vaginal cuff present s/p TAH. Copious white discharge. About 1 cm indurated cystic lesion on pubic mons, tender to palpation, no active drainage. Chaperoned by RN.   ASSESSMENT/PLAN:   Assessment & Plan Vaginal discharge Screening for STDs (sexually transmitted diseases) -G/C, Wet prep ordered -HIV/RPR ordered  Hidradenitis suppurativa Presentation and history consistent with HS. Per chart review, has previous diagnosis of HS. Patient prefers oral abx.  - Doxycyline 100 BID x7 days - Dermatology referral    RTC for PCP FU as needed.   Damien Cassis, MD [facility]

## 2023-10-14 NOTE — Patient Instructions (Signed)
 Thank you for visiting clinic today and allowing us  to participate in your care!  Your bumps appear consistent with hidradenitis suppurativa. We have prescribed doxycyline for you and referred you to dermatology for further treatment.   We collected routine STI/STD testing today and will let you know of the results. We sent for diflucan  as well.   Please schedule an appointment with your PCP as needed.   Reach out any time with any questions or concerns you may have - we are here for you!  Damien Cassis, MD [facility] [phone]

## 2023-10-15 LAB — CERVICOVAGINAL ANCILLARY ONLY
Chlamydia: NEGATIVE
Comment: NEGATIVE
Comment: NORMAL
Neisseria Gonorrhea: NEGATIVE

## 2023-10-15 LAB — HIV ANTIBODY (ROUTINE TESTING W REFLEX): HIV Screen 4th Generation wRfx: NONREACTIVE

## 2023-10-15 LAB — RPR: RPR Ser Ql: NONREACTIVE

## 2023-10-15 NOTE — Assessment & Plan Note (Signed)
 Presentation and history consistent with HS. Per chart review, has previous diagnosis of HS. Patient prefers oral abx.  - Doxycyline 100 BID x7 days - Dermatology referral

## 2023-10-15 NOTE — Assessment & Plan Note (Signed)
-  G/C, Wet prep ordered -HIV/RPR ordered

## 2023-10-19 ENCOUNTER — Encounter: Payer: Self-pay | Admitting: Family Medicine

## 2023-10-19 ENCOUNTER — Ambulatory Visit: Admitting: Family Medicine

## 2023-10-19 VITALS — BP 103/74 | HR 92 | Ht 62.0 in | Wt 161.4 lb

## 2023-10-19 DIAGNOSIS — H6593 Unspecified nonsuppurative otitis media, bilateral: Secondary | ICD-10-CM

## 2023-10-19 DIAGNOSIS — J0100 Acute maxillary sinusitis, unspecified: Secondary | ICD-10-CM

## 2023-10-19 MED ORDER — FLUTICASONE PROPIONATE 50 MCG/ACT NA SUSP
2.0000 | Freq: Every day | NASAL | 6 refills | Status: AC
Start: 2023-10-19 — End: ?

## 2023-10-19 MED ORDER — SALINE SPRAY 0.65 % NA SOLN: 1.0000 | NASAL | 0 refills | Status: AC | PRN

## 2023-10-19 NOTE — Patient Instructions (Signed)
 You have a sinus infection with fluid backing up into your ears.  You do not yet have an ear infection, but if you develop fevers or anything changes, please return to see us  again.  General Recommendations:   Please drink plenty of fluids. Get plenty of rest  Sleep in humidified air Use saline nasal sprays  OTC Medications:  Decongestants - helps relieve congestion  Flonase  (generic fluticasone ) or Nasacort  (generic triamcinolone ) - please make sure to use the cross-over technique at a 45 degree angle towards the opposite eye as opposed to straight up the nasal passageway.  Sudafed (generic pseudoephedrine  - Note this is the one that is available behind the pharmacy counter); Products with phenylephrine (-PE) may also be used but is often not as effective as pseudoephedrine . If you have HIGH BLOOD PRESSURE - Coricidin HBP; AVOID any product that is -D as this contains pseudoephedrine  which may increase your blood pressure. Afrin (oxymetazoline ) every 6-8 hours for up to 3 days.   Allergies - helps relieve runny nose, itchy eyes and sneezing  Claritin (generic loratidine), Allegra (fexofenidine), or Zyrtec  (generic cyrterizine) for runny nose. These medications should not cause drowsiness. Note - Benadryl  (generic diphenhydramine ) may be used however may cause drowsiness  Cough -  Delsym or Robitussin (generic dextromethorphan)  Expectorants - helps loosen mucus to ease removal  Mucinex  (generic guaifenesin ) as directed on the package.  Headaches / General Aches  Tylenol  (generic acetaminophen ) - DO NOT EXCEED 3 grams (3,000 mg) in a 24 hour time period Advil /Motrin  (generic ibuprofen )   Sore Throat -  Salt water  gargle  Chloraseptic (generic benzocaine ) spray or lozenges / Sucrets (generic dyclonine)    Sinusitis Sinusitis is redness, soreness, and inflammation of the paranasal sinuses. Paranasal sinuses are air pockets within the bones of your face (beneath the eyes,  the middle of the forehead, or above the eyes). In healthy paranasal sinuses, mucus is able to drain out, and air is able to circulate through them by way of your nose. However, when your paranasal sinuses are inflamed, mucus and air can become trapped. This can allow bacteria and other germs to grow and cause infection. Sinusitis can develop quickly and last only a short time (acute) or continue over a long period (chronic). Sinusitis that lasts for more than 12 weeks is considered chronic.  CAUSES  Causes of sinusitis include: Allergies. Structural abnormalities, such as displacement of the cartilage that separates your nostrils (deviated septum), which can decrease the air flow through your nose and sinuses and affect sinus drainage. Functional abnormalities, such as when the small hairs (cilia) that line your sinuses and help remove mucus do not work properly or are not present. SIGNS AND SYMPTOMS  Symptoms of acute and chronic sinusitis are the same. The primary symptoms are pain and pressure around the affected sinuses. Other symptoms include: Upper toothache. Earache. Headache. Bad breath. Decreased sense of smell and taste. A cough, which worsens when you are lying flat. Fatigue. Fever. Thick drainage from your nose, which often is green and may contain pus (purulent). Swelling and warmth over the affected sinuses. DIAGNOSIS  Your health care provider will perform a physical exam. During the exam, your health care provider may: Look in your nose for signs of abnormal growths in your nostrils (nasal polyps). Tap over the affected sinus to check for signs of infection. View the inside of your sinuses (endoscopy) using an imaging device that has a light attached (endoscope). If your health care provider suspects  that you have chronic sinusitis, one or more of the following tests may be recommended: Allergy tests. Nasal culture. A sample of mucus is taken from your nose, sent to a lab,  and screened for bacteria. Nasal cytology. A sample of mucus is taken from your nose and examined by your health care provider to determine if your sinusitis is related to an allergy. TREATMENT  Most cases of acute sinusitis are related to a viral infection and will resolve on their own within 10 days. Sometimes medicines are prescribed to help relieve symptoms (pain medicine, decongestants, nasal steroid sprays, or saline sprays).  However, for sinusitis related to a bacterial infection, your health care provider will prescribe antibiotic medicines. These are medicines that will help kill the bacteria causing the infection.  Rarely, sinusitis is caused by a fungal infection. In theses cases, your health care provider will prescribe antifungal medicine. For some cases of chronic sinusitis, surgery is needed. Generally, these are cases in which sinusitis recurs more than 3 times per year, despite other treatments. HOME CARE INSTRUCTIONS  Drink plenty of water . Water  helps thin the mucus so your sinuses can drain more easily. Use a humidifier. Inhale steam 3 to 4 times a day (for example, sit in the bathroom with the shower running). Apply a warm, moist washcloth to your face 3 to 4 times a day, or as directed by your health care provider. Use saline nasal sprays to help moisten and clean your sinuses. Take medicines only as directed by your health care provider. If you were prescribed either an antibiotic or antifungal medicine, finish it all even if you start to feel better. SEEK IMMEDIATE MEDICAL CARE IF: You have increasing pain or severe headaches. You have nausea, vomiting, or drowsiness. You have swelling around your face. You have vision problems. You have a stiff neck. You have difficulty breathing. MAKE SURE YOU:  Understand these instructions. Will watch your condition. Will get help right away if you are not doing well or get worse. Document Released: 01/26/2005 Document Revised:  06/12/2013 Document Reviewed: 02/10/2011 [facility] Patient Information 2015 [place], [place]. This information is not intended to replace advice given to you by your health care provider. Make sure you discuss any questions you have with your health care provider.

## 2023-10-19 NOTE — Progress Notes (Cosign Needed Addendum)
   SUBJECTIVE:   CHIEF COMPLAINT / HPI:  [name] is a 43 y.o. female with a pertinent past medical history of GERD, hidradenitis suppurativa, and tobacco use disorder presenting to the clinic for 3 days of bilateral ear pain R>>L.  Ear pain Location: Bilateral, but R>L, R eye is also watering Ear pain started: 3 days ago Pain quality: Worsening, dull, severe Recent ear trauma: None Prior ear surgeries: None History of diabetes: None  Symptoms include: Dizziness and sensation of something in the ears. Denies Ear discharge, Fever, Pain with chewing, Ringing in ears, Hearing loss, Rashes or blisters around ear, and Weight loss.    PERTINENT PMH / PSH: GERD Hidradenitis suppurativa Tobacco use disorder (1/4 packs x 10 years)  *Remainder reviewed in problem list.   OBJECTIVE:   BP 103/74   Pulse 92   Ht 5' 2 (1.575 m)   Wt 161 lb 6.4 oz (73.2 kg)   [name] 02/23/2021 (Approximate)   SpO2 99%   BMI 29.52 kg/m   General: Age-appropriate, resting comfortably in chair, NAD, alert and at baseline. HEENT:  Head: Normocephalic, atraumatic.  Moderate tenderness to percussion over sinuses. Eyes: PERRLA. No conjunctival erythema or scleral injections. Ears: TMs non-bulging and clear without erythema but with visible effusions R>L. No erythema of external ear canal. No cerumen impaction. Nose: Boggy erythematous turbinates.  Mucous rhinorrhea. Mouth/Oral: Clear, no tonsillar exudate. MMM. Neck: Supple. No LAD. Cardiovascular: Regular rate and rhythm. Normal S1/S2. No murmurs, rubs, or gallops appreciated. 2+ radial pulses. Pulmonary: Clear bilaterally to ascultation. No wheezes, crackles, or rhonchi. Normal WOB on room air. No accessory muscle use. Skin: Warm and dry.  No rashes grossly. Extremities: No peripheral edema bilaterally. Capillary refill <2 seconds.   ASSESSMENT/PLAN:   Assessment & Plan Acute non-recurrent maxillary sinusitis Fluid level behind tympanic membrane  of both ears Favor viral sinusitis with secondary bilateral middle ear effusions, greater on the right.  No evidence of acute otitis media and reassuringly patient has remained afebrile.  Given recent onset of symptoms, do not favor bacterial sinusitis and will not pursue antibiotic treatment at this time.  Supportive care is indicated. Of note, suspect that the patient has underlying eustachian tube dysfunction given the fact that she has history of recurrent ear pain with upper respiratory infections. -Recommended nasal saline rinses, fluticasone , azelastine nasal sprays -Discussed OTC decongestants and expectorants to facilitate sinus drainage -Reviewed return precautions if not improving, or if fevers develop  Return if symptoms worsen or fail to improve.  Avyaan Summer Toma, MD [facility]

## 2023-10-21 ENCOUNTER — Ambulatory Visit: Admitting: Student

## 2023-10-21 ENCOUNTER — Ambulatory Visit: Payer: Self-pay | Admitting: Student

## 2023-10-21 ENCOUNTER — Encounter: Payer: Self-pay | Admitting: Student

## 2023-10-21 DIAGNOSIS — H65191 Other acute nonsuppurative otitis media, right ear: Secondary | ICD-10-CM | POA: Diagnosis not present

## 2023-10-21 LAB — POC SOFIA 2 FLU + SARS ANTIGEN FIA
Influenza A, POC: NEGATIVE
Influenza B, POC: NEGATIVE
SARS Coronavirus 2 Ag: NEGATIVE

## 2023-10-21 MED ORDER — AMOXICILLIN-POT CLAVULANATE 875-125 MG PO TABS: 1.0000 | ORAL_TABLET | Freq: Two times a day (BID) | ORAL | 0 refills | Status: AC

## 2023-10-21 NOTE — Progress Notes (Signed)
    SUBJECTIVE:   CHIEF COMPLAINT / HPI:   Right ear pain Seen in our clinic 2 days ago, diagnosed with sinusitis-has not tried treatments recommended at that time.  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  Additionally, wants her ear looked at again.  She is having otalgia, no tinnitus.  She is taking Sudafed, no other medications.  OBJECTIVE:   BP 112/81   Pulse 84   Temp 98.4 F (36.9 C)   Ht 5' 2 (1.575 m)   Wt 159 lb 3.2 oz (72.2 kg)   [name] 02/23/2021 (Approximate)   SpO2 100%   BMI 29.12 kg/m    General: NAD, pleasant Ears: Normal external ear, canal and TM on left.  Normal external ear and canal on right, opaque TM with loss of cone of light and minimal bulging on right. Cardio: RRR, no MRG. Respiratory: CTAB, normal wob on RA Skin: Warm and dry  ASSESSMENT/PLAN:   Assessment & Plan Other non-recurrent acute nonsuppurative otitis media of right ear COVID/flu testing negative.  No evidence of otitis media on exam. - Augmentin  875-125 mg tablet twice daily for 5 days - follow-up if symptoms fail to improve   Gladis Church, DO [facility]

## 2023-10-21 NOTE — Patient Instructions (Signed)
 It was great to see you! Thank you for allowing me to participate in your care!   I recommend that you always bring your medications to each appointment as this makes it easy to ensure we are on the correct medications and helps us  not miss when refills are needed.  Our plans for today:  - Take Augmentin  twice a day for 5 days for your infection - We will message you on MyChart about your COVID/flu test  Take care and seek immediate care sooner if you develop any concerns. Please remember to show up 15 minutes before your scheduled appointment time!  Gladis Church, DO [facility]

## 2023-11-04 ENCOUNTER — Ambulatory Visit: Admitting: Family Medicine

## 2023-11-04 NOTE — Progress Notes (Deleted)
    SUBJECTIVE:   CHIEF COMPLAINT / HPI:   Ear and neck pain She was recently diagnosed with otitis media and prescribed Augmentin  twice daily for 5 days.  She completed this course*** Her COVID and flu testing were negative on 9/11. ***  PERTINENT  PMH / PSH: ***  OBJECTIVE:   [name] 02/23/2021 (Approximate)   ***  ASSESSMENT/PLAN:   Assessment & Plan    Payton Coward, MD [facility]

## 2023-11-08 ENCOUNTER — Encounter: Payer: Self-pay | Admitting: Student

## 2023-11-08 ENCOUNTER — Ambulatory Visit: Admitting: Student

## 2023-11-08 VITALS — BP 113/77 | HR 81 | Ht 62.0 in | Wt 157.8 lb

## 2023-11-08 DIAGNOSIS — H9201 Otalgia, right ear: Secondary | ICD-10-CM | POA: Diagnosis not present

## 2023-11-08 NOTE — Progress Notes (Signed)
    SUBJECTIVE:   CHIEF COMPLAINT / HPI:   [name] is a 43 y.o. female presenting for continued right ear pain.   I reviewed previous office notes.  Previously evaluated by 2 other doctors at [facility].  Was presumed to be viral.  Follow-up a few days later she was given a 5-day course of Augmentin .  She completed this antibiotic course without significant relief. I reviewed previous lab results.  Tested negative for COVID and flu.  Pain continues to be in the right jaw to ear. No drainage from the ear No fever No trouble with swallowing No dizziness, LOC  PERTINENT  PMH / PSH: reviewed and updated.  OBJECTIVE:   BP 113/77   Pulse 81   Ht 5' 2 (1.575 m)   Wt 157 lb 12.8 oz (71.6 kg)   [name] 02/23/2021 (Approximate)   SpO2 100%   BMI 28.86 kg/m   Well-appearing, no acute distress HEENT: Ear: TMs bilaterally normal without effusion, ear canals clear without signs of external infection No mastoid tenderness Mouth: Multiple cavities on upper and lower second molars, no abscesses appreciated Cardio: Regular rate, regular rhythm, no murmurs on exam. Pulm: Clear, no wheezing, no crackles. No increased work of breathing Abdominal: bowel sounds present, soft, non-tender, non-distended   ASSESSMENT/PLAN:   Assessment & Plan Right ear pain Physical exam consistent with referred pain to the right ear from cavities.  No signs of infections on exam.  No dental abscesses noted. Patient will need an evaluation from a dentist Pain did not improve with antibiotic course, will hold off on those for now. Patient to return when she has had dental work completed if she continues to have right ear pain.     Damien Pinal, DO [facility]

## 2023-11-08 NOTE — Patient Instructions (Signed)
 It was great to see you today!   Follow up with dentist   No future appointments.  Please arrive 15 minutes before your appointment to ensure smooth check in process.  If you are more than 15 minutes late, you may be asked to reschedule.   Please bring a list of your medications with you to all appointments.   Please call the clinic at [phone] if your symptoms worsen or you have any concerns.  Thank you for allowing me to participate in your care, Dr. Damien Pinal [facility]

## 2023-11-25 ENCOUNTER — Encounter: Payer: Self-pay | Admitting: Family Medicine

## 2023-11-25 ENCOUNTER — Ambulatory Visit (INDEPENDENT_AMBULATORY_CARE_PROVIDER_SITE_OTHER): Admitting: Family Medicine

## 2023-11-25 ENCOUNTER — Other Ambulatory Visit (HOSPITAL_COMMUNITY)
Admission: RE | Admit: 2023-11-25 | Discharge: 2023-11-25 | Disposition: A | Discharge status: Home / Self Care | Source: Ambulatory Visit | Attending: Family Medicine | Admitting: Family Medicine

## 2023-11-25 VITALS — BP 126/88 | HR 86 | Ht 62.0 in | Wt 159.2 lb

## 2023-11-25 DIAGNOSIS — M545 Low back pain, unspecified: Secondary | ICD-10-CM | POA: Diagnosis not present

## 2023-11-25 DIAGNOSIS — N898 Other specified noninflammatory disorders of vagina: Secondary | ICD-10-CM

## 2023-11-25 DIAGNOSIS — R399 Unspecified symptoms and signs involving the genitourinary system: Secondary | ICD-10-CM

## 2023-11-25 LAB — POCT WET PREP (WET MOUNT)
Clue Cells Wet Prep Whiff POC: NEGATIVE
Trichomonas Wet Prep HPF POC: ABSENT
WBC, Wet Prep HPF POC: NONE SEEN

## 2023-11-25 LAB — POCT URINALYSIS DIP (CLINITEK)
Bilirubin, UA: NEGATIVE
Blood, UA: NEGATIVE
Glucose, UA: NEGATIVE mg/dL
Ketones, POC UA: NEGATIVE mg/dL
Leukocytes, UA: NEGATIVE
Nitrite, UA: NEGATIVE
POC PROTEIN,UA: NEGATIVE
Spec Grav, UA: 1.02 (ref 1.010–1.025)
Urobilinogen, UA: 0.2 U/dL
pH, UA: 6.5 (ref 5.0–8.0)

## 2023-11-25 MED ORDER — IBUPROFEN 600 MG PO TABS
600.0000 mg | ORAL_TABLET | Freq: Three times a day (TID) | ORAL | 0 refills | Status: AC | PRN
Start: 2023-11-25 — End: ?

## 2023-11-25 MED ORDER — LIDOCAINE 5 % EX PTCH: 1.0000 | MEDICATED_PATCH | CUTANEOUS | 0 refills | Status: AC

## 2023-11-25 MED ORDER — FLUCONAZOLE 150 MG PO TABS: 150.0000 mg | ORAL_TABLET | Freq: Once | ORAL | 0 refills | Status: AC

## 2023-11-25 NOTE — Progress Notes (Signed)
    SUBJECTIVE:   CHIEF COMPLAINT / HPI:   Back pain began 1 week ago Pain is described as bilateral lower. Patient has tried tylenol  with no improvement. History of trauma or injury: no Patient believes might be causing their pain: unsure, maybe UTI  Prior history of similar pain: no History of cancer: no Weak immune system:  no History of IV drug use: no History of steroid use: no  Symptoms Incontinence of bowel or bladder:  no Numbness of leg: no Fever: no Rest or Night pain: no Weight Loss:  no Rash: no Lifts heavy things at work  Also reports vaginal discharge for 2 weeks.  Feels like she has a yeast infection.  Recently took antibiotics for ear infection and symptoms started after that.  No obvious urinary symptoms.  Would like STD testing. Hysterectomy around 4 years ago  PERTINENT  PMH / PSH: reviewed  OBJECTIVE:   BP 126/88   Pulse 86   Ht 5' 2 (1.575 m)   Wt 159 lb 3.2 oz (72.2 kg)   [name] 02/23/2021 (Approximate)   SpO2 100%   BMI 29.12 kg/m    General: well appearing, NAD Cardiovascular: RRR, no m/r/g Respiratory: normal work of breathing on RA, CTAB Pelvic - Stacey, CMA present as chaperone. VULVA: normal appearing vulva with no masses, tenderness or lesions  VAGINA: normal appearing vagina with normal color, no lesions  CERVIX: normal appearing cervix with copious thick white clumpy discharge present, no cervical motion tenderness  ASSESSMENT/PLAN:   Assessment & Plan Acute bilateral low back pain without sciatica UA negative today.  Will obtain CMP to assess kidney and liver function.  High suspicion of musculoskeletal strain.  No red flag symptoms. Ibuprofen  600 mg every 8 hours, lidocaine  patches Follow-up next week if symptoms are not improving Vaginal discharge Wet prep negative today however clinically appears to have yeast infection and will treat with Diflucan  today. STD testing obtained and pending     Kerri Prescott, DO [facility]

## 2023-11-25 NOTE — Assessment & Plan Note (Signed)
 Wet prep negative today however clinically appears to have yeast infection and will treat with Diflucan  today. STD testing obtained and pending

## 2023-11-25 NOTE — Patient Instructions (Addendum)
 Good to see you today - Thank you for coming in  Things we discussed today:  You can take 600 mg every 8 hours for your back pain.  If you are still having this back pain next week despite the ibuprofen  please come back. I have also sent in lidocaine  patches that you can apply to your back for the pain.  The most important thing is to continue moving, laying still can make your back pain worse.  Take the Diflucan  for yeast infection.  We are checking for STDs today.  I will let you know if anything comes back positive.

## 2023-11-26 ENCOUNTER — Ambulatory Visit: Payer: Self-pay | Admitting: Family Medicine

## 2023-11-26 LAB — COMPREHENSIVE METABOLIC PANEL WITH GFR
ALT: 13 IU/L (ref 0–32)
AST: 16 IU/L (ref 0–40)
Albumin: 4.5 g/dL (ref 3.9–4.9)
Alkaline Phosphatase: 71 IU/L (ref 41–116)
BUN/Creatinine Ratio: 13 (ref 9–23)
BUN: 10 mg/dL (ref 6–24)
Bilirubin Total: <0.2 mg/dL (ref 0.0–1.2)
CO2: 22 mmol/L (ref 20–29)
Calcium: 9.8 mg/dL (ref 8.7–10.2)
Chloride: 104 mmol/L (ref 96–106)
Creatinine, Ser: 0.78 mg/dL (ref 0.57–1.00)
Globulin, Total: 2.9 g/dL (ref 1.5–4.5)
Glucose: 81 mg/dL (ref 70–99)
Potassium: 4.2 mmol/L (ref 3.5–5.2)
Sodium: 141 mmol/L (ref 134–144)
Total Protein: 7.4 g/dL (ref 6.0–8.5)
eGFR: 96 mL/min/1.73 (ref >59)

## 2023-11-26 LAB — HIV ANTIBODY (ROUTINE TESTING W REFLEX): HIV Screen 4th Generation wRfx: NONREACTIVE

## 2023-11-26 LAB — RPR: RPR Ser Ql: NONREACTIVE

## 2023-11-26 LAB — CERVICOVAGINAL ANCILLARY ONLY
Chlamydia: NEGATIVE
Comment: NEGATIVE
Comment: NEGATIVE
Comment: NORMAL
Neisseria Gonorrhea: NEGATIVE
Trichomonas: NEGATIVE

## 2023-12-10 ENCOUNTER — Ambulatory Visit

## 2023-12-10 ENCOUNTER — Ambulatory Visit: Admitting: Family Medicine

## 2023-12-10 ENCOUNTER — Encounter: Payer: Self-pay | Admitting: Family Medicine

## 2023-12-10 VITALS — BP 100/78 | HR 96 | Temp 98.6°F | Ht 62.0 in | Wt 154.0 lb

## 2023-12-10 DIAGNOSIS — R197 Diarrhea, unspecified: Secondary | ICD-10-CM | POA: Diagnosis not present

## 2023-12-10 DIAGNOSIS — J0100 Acute maxillary sinusitis, unspecified: Secondary | ICD-10-CM | POA: Diagnosis not present

## 2023-12-10 DIAGNOSIS — R059 Cough, unspecified: Secondary | ICD-10-CM | POA: Diagnosis not present

## 2023-12-10 DIAGNOSIS — R1030 Lower abdominal pain, unspecified: Secondary | ICD-10-CM | POA: Diagnosis not present

## 2023-12-10 LAB — POC SOFIA 2 FLU + SARS ANTIGEN FIA
Influenza A, POC: NEGATIVE
Influenza B, POC: NEGATIVE
SARS Coronavirus 2 Ag: NEGATIVE

## 2023-12-10 MED ORDER — ALBUTEROL SULFATE HFA 108 (90 BASE) MCG/ACT IN AERS: 1.0000 | INHALATION_SPRAY | Freq: Four times a day (QID) | RESPIRATORY_TRACT | 0 refills | Status: DC | PRN

## 2023-12-10 MED ORDER — LOPERAMIDE HCL 2 MG PO CAPS: 2.0000 mg | ORAL_CAPSULE | ORAL | 0 refills | Status: AC | PRN

## 2023-12-10 MED ORDER — AMOXICILLIN-POT CLAVULANATE 875-125 MG PO TABS: 1.0000 | ORAL_TABLET | Freq: Two times a day (BID) | ORAL | 0 refills | Status: AC

## 2023-12-10 MED ORDER — ONDANSETRON 4 MG PO TBDP
4.0000 mg | ORAL_TABLET | Freq: Three times a day (TID) | ORAL | 0 refills | Status: AC | PRN
Start: 2023-12-10 — End: ?

## 2023-12-10 NOTE — Patient Instructions (Signed)
  VISIT SUMMARY: You came in today with three weeks of worsening respiratory and gastrointestinal symptoms, including diarrhea, vomiting, persistent cough, and difficulty breathing. We have prescribed medications to address these issues and provided guidance on managing your symptoms.  YOUR PLAN: ACUTE MAXILLARY SINUSITIS WITH COUGH, WHEEZING, AND CHEST DISCOMFORT: You have a sinus infection with symptoms like green sputum, headaches, sinus tenderness, wheezing, and chest discomfort. -Take Augmentin  twice daily for ten days. -Use your albuterol  inhaler as needed for breathing difficulties. -Seek medical attention if your symptoms persist after finishing the antibiotics or if you experience chest pain or breathing issues.  ACUTE DIARRHEA: You have had diarrhea for three weeks. -Take loperamide  as prescribed. -Stay hydrated with water  or low-sugar fluids.  ACUTE NAUSEA AND VOMITING: You have been experiencing persistent nausea and vomiting, making it difficult to keep food down. -Take Zofran  as prescribed.                      Contains text generated by [employer]bridge.                                 Contains text generated by [employer]bridge.

## 2023-12-10 NOTE — Progress Notes (Signed)
    SUBJECTIVE:   CHIEF COMPLAINT / HPI:   Discussed the use of AI scribe software for clinical note transcription with the patient, who gave verbal consent to proceed.  History of Present Illness CHALISE PE is a 43 year old female who presents with three weeks of worsening respiratory and gastrointestinal symptoms.  Gastrointestinal symptoms - Diarrhea and vomiting for three weeks - Vomit described as green - Unable to retain food for the last three days - Weight loss of approximately three pounds over three weeks - Temporary relief with over-the-counter medications including Robitussin and honey - Imodium  used for diarrhea with recurrence of symptoms the following morning but this did help initially - Nausea present - Attempting to maintain hydration with water  and chicken soup broth  Respiratory symptoms - Persistent cough for three weeks - Wheezing and difficulty breathing occasionally but not currently - Night sweats requiring multiple shirt changes overnight - Feels cold while sweating, having chills - No sinus pain, but has headaches and sinus tenderness - Runny nose and mucus production - Albuterol  inhaler previously used during COVID-19 infection provided relief for breathing difficulties  Substance use and exposures - No recent alcohol or marijuana use - No known sick contacts     PERTINENT  PMH / PSH: N/A  OBJECTIVE:   BP 100/78   Pulse 96   Temp 98.6 F (37 C) (Oral)   Ht 5' 2 (1.575 m)   Wt 154 lb (69.9 kg)   [name] 02/23/2021 (Approximate)   SpO2 100%   BMI 28.17 kg/m    General: NAD, pleasant, able to participate in exam HEENT: Bilateral maxillary sinuses mildly tender to palpation.  Posterior oropharynx clear without significant erythema, tonsillar swelling, or exudate. Cardiac: RRR, no murmurs auscultated Respiratory: CTAB, normal WOB.  Productive cough. Abdomen: soft, non-tender, non-distended, normoactive bowel sounds Extremities: warm and  well perfused, no edema or cyanosis Skin: warm and dry, no rashes noted Neuro: alert, no obvious focal deficits, speech normal Psych: Normal affect and mood  ASSESSMENT/PLAN:    Assessment & Plan Acute maxillary sinusitis, recurrence not specified Diarrhea, unspecified type Sinus tenderness and reported fever/chills, now ongoing greater than 10 days Treat with Augmentin  twice daily x 10 days Sent Rx for loperamide  for diarrhea, use as needed Sent Zofran  for nausea to help with keeping down meals Also sent refill of albuterol  inhaler per patient request though she does not have a formal diagnosis of asthma Discussed return precautions and supportive care COVID/flu negative   Kerri Coward, MD [facility]

## 2023-12-27 ENCOUNTER — Ambulatory Visit (HOSPITAL_COMMUNITY): Admission: EM | Admit: 2023-12-27 | Discharge: 2023-12-27 | Disposition: A | Discharge status: Home / Self Care

## 2023-12-27 ENCOUNTER — Other Ambulatory Visit: Payer: Self-pay

## 2023-12-27 ENCOUNTER — Encounter (HOSPITAL_COMMUNITY): Payer: Self-pay | Admitting: Emergency Medicine

## 2023-12-27 ENCOUNTER — Ambulatory Visit (INDEPENDENT_AMBULATORY_CARE_PROVIDER_SITE_OTHER)

## 2023-12-27 DIAGNOSIS — R2231 Localized swelling, mass and lump, right upper limb: Secondary | ICD-10-CM

## 2023-12-27 DIAGNOSIS — M5410 Radiculopathy, site unspecified: Secondary | ICD-10-CM | POA: Diagnosis not present

## 2023-12-27 MED ORDER — KETOROLAC TROMETHAMINE 60 MG/2ML IM SOLN
INTRAMUSCULAR | Status: AC
  Filled 2023-12-27: qty 2

## 2023-12-27 MED ORDER — IBUPROFEN 800 MG PO TABS
800.0000 mg | ORAL_TABLET | Freq: Three times a day (TID) | ORAL | 0 refills | Status: AC
Start: 2023-12-27 — End: ?

## 2023-12-27 MED ORDER — DOXYCYCLINE HYCLATE 100 MG PO CAPS: 100.0000 mg | ORAL_CAPSULE | Freq: Two times a day (BID) | ORAL | 0 refills | Status: AC

## 2023-12-27 MED ORDER — KETOROLAC TROMETHAMINE 60 MG/2ML IM SOLN
60.0000 mg | Freq: Once | INTRAMUSCULAR | Status: AC
  Administered 2023-12-27: 60 mg via INTRAMUSCULAR

## 2023-12-27 MED ORDER — PREDNISONE 20 MG PO TABS: 40.0000 mg | ORAL_TABLET | Freq: Every day | ORAL | 0 refills | Status: AC

## 2023-12-27 NOTE — ED Provider Notes (Signed)
 [facility]    CSN: [id number] Arrival date & time: 12/27/23  0809      History   Chief Complaint Chief Complaint  Patient presents with   Arm Pain    HPI [name] is a 43 y.o. female.  Patient presents today for right forearm/wrist pain that has been ongoing for 1 week.  Has been progressively getting worse throughout the week.  On Saturday morning she began to have difficulty using her right hand as well.  States this morning it got even worse and began swelling as well.  Swelling is located in volar and distal right forearm.  Says it is painful to move her hand, especially extending her fingers.  She also has trouble reaching overhead and has shoulder weakness.  Describes pain as radicular extending from right shoulder all the way down into her hand.  Denies any recent injuries or known inciting factors.  Denies any recent penetrating injuries or lacerations.  Denies any fevers or chills.  Denies any recent travel.   Arm Pain    Past Medical History:  Diagnosis Date   Adhesions of adnexa of uterus due to endometriosis 11/12/2021   Arthritis    left ankle   COVID-19 09/24/2020   flu like symptoms per pt   Endometriosis    w/abnormal uterine bleeding, Pt follows with Dr. Elveria Annas, LOV 08/27/21 as of 10/22/21.   Ganglion cyst of foot    left foot, hx of aspiration, Follows with Dr. Ludie Rimes, Sports Medicine, lov 10/06/21   GERD (gastroesophageal reflux disease)    occasional with spicy foods   History of 2019 novel coronavirus disease (COVID-19)    pt tested positive 03-03-2019, results in care everywhere.  (05-22-2019 per pt had symptoms of fatigue, headache, and loss of taste/ smell;  fatigue and headache after one week and loss taste/ smell resolved after 3 wks)   Hydrosalpinx 12/30/2018   10x4x5cm right hydrosalpinx  Also with 2 small fibroids and left hemorrhagic cyst  Offered obs vs surgical intervention. Pt desires surgery  Move to [facility]  salpingectomy     Infertility, female    Menometrorrhagia 09/21/2006   Qualifier: Diagnosis of   By: Loretha MD, Victoria         Menorrhagia 12/25/2019   Ovarian cyst    Pelvic mass in female    large fluid filled pelvic mass   Recurrent boils    Septate uterus    SEPTATE UTERUS 07/24/2009   Qualifier: Diagnosis of   By: Gladis FNP, Nykedtra         Trichomoniasis    hx of   Viral labyrinthitis 06/20/2007   Qualifier: Diagnosis of   By: Loretha MD, Victoria          Patient Active Problem List   Diagnosis Date Noted   S/P TAH (total abdominal hysterectomy) 02/24/2023   Pelvic pain in female 11/11/2021   Pain of joint of left ankle and foot 10/06/2021   Fatigue 09/01/2021   Molluscum contagiosum 12/19/2020   Abdominal pain 08/16/2020   Constipation 08/16/2020   Hidradenitis suppurativa 06/24/2020   Ganglion cyst of left foot 06/13/2020   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   Vaginal discharge 07/08/2019   GERD (gastroesophageal reflux disease) 06/07/2019   HSV-2 seropositive 03/07/2019   TOBACCO ABUSE 09/26/2008   Female infertility 09/26/2008    Past Surgical History:  Procedure Laterality Date   BREAST BIOPSY Right 1999   per pt benign    CYSTOSCOPY N/A 11/11/2021  Procedure: CYSTOSCOPY;  Surgeon: Corene Coy, MD;  Location: [facility];  Service: Gynecology;  Laterality: N/A;   DILITATION & CURRETTAGE/HYSTROSCOPY WITH NOVASURE ABLATION N/A 05/08/2021   Procedure: HYSTEROSCOPY, DILATATION & CURETTAGE, ENDOMETRIAL ABLATION WITH NOVASURE;  Surgeon: Corene Coy, MD;  Location: [facility];  Service: Gynecology;  Laterality: N/A;   INCISION AND DRAINAGE     right axilla, 2019, 2020, 2022, 2023   LAPAROSCOPIC SALPINGOOPHERECTOMY Right 03-25-2010   @[facility]    and LYSIS ADHESIONS   LAPAROSCOPY N/A 05/30/2019   Procedure: DIAGNOSTIC LAPAROSCOPY;  Surgeon: Eveline Lynwood MATSU, MD;  Location: [facility];  Service: Gynecology;  Laterality: N/A;    ROBOTIC ASSISTED TOTAL HYSTERECTOMY N/A 11/11/2021   Procedure: XI ROBOTIC ASSISTED TOTAL HYSTERECTOMY WITH LEFT SALPINGECTOMY;  Surgeon: Corene Coy, MD;  Location: [facility];  Service: Gynecology;  Laterality: N/A;    OB History     Gravida  0   Para      Term      Preterm      AB      Living  0      SAB      IAB      Ectopic      Multiple      Live Births               Home Medications    Prior to Admission medications   Medication Sig Start Date End Date Taking? Authorizing Provider  albuterol  (VENTOLIN  HFA) 108 (90 Base) MCG/ACT inhaler Inhale 1-2 puffs into the lungs every 6 (six) hours as needed for wheezing or shortness of breath. 12/10/23   Romelle Booty, MD  doxycycline  (VIBRA -TABS) 100 MG tablet Take 1 tablet (100 mg total) by mouth 2 (two) times daily. Patient not taking: Reported on 12/27/2023 10/14/23   Diona Perkins, MD  fluticasone  (FLONASE ) 50 MCG/ACT nasal spray Place 2 sprays into both nostrils daily. 10/19/23   Shitarev, Dimitry, MD  ibuprofen  (ADVIL ) 600 MG tablet Take 1 tablet (600 mg total) by mouth every 8 (eight) hours as needed. 11/25/23   Everhart, Kirstie, DO  lidocaine  (LIDODERM ) 5 % Place 1 patch onto the skin daily. Remove & Discard patch within 12 hours or as directed by MD 11/25/23   Everhart, Kirstie, DO  loperamide  (IMODIUM ) 2 MG capsule Take 1 capsule (2 mg total) by mouth as needed for diarrhea or loose stools. 12/10/23   Romelle Booty, MD  metroNIDAZOLE  (METROGEL ) 0.75 % vaginal gel Place 1 Applicatorful vaginally at bedtime. For 5 days. Patient not taking: Reported on 12/27/2023 10/14/23   Diona Perkins, MD  Multiple Vitamin (MULTIVITAMIN WITH MINERALS) TABS tablet Take 1 tablet by mouth daily.    [provider]  ondansetron  (ZOFRAN -ODT) 4 MG disintegrating tablet Take 1 tablet (4 mg total) by mouth every 8 (eight) hours as needed for nausea. 12/10/23   Romelle Booty, MD  sodium chloride  (OCEAN) 0.65  % SOLN nasal spray Place 1 spray into both nostrils as needed for congestion. 10/19/23   Shitarev, Dimitry, MD  Norethindrone  Acetate-Ethinyl Estrad-FE (LOESTRIN 24 FE) 1-20 MG-MCG(24) tablet Take 1 tablet by mouth daily. 03/21/20 05/28/20  Corene Coy, MD  Norgestimate -Ethinyl Estradiol  Triphasic (TRI-SPRINTEC) 0.18/0.215/0.25 MG-35 MCG tablet Take 1 tablet by mouth daily. 11/03/10 05/01/11  Eveline Lynwood MATSU, MD    Family History Family History  Problem Relation Age of Onset   Hypertension Mother    Diabetes Mother    Cancer Father    Hypertension Father  Diabetes Brother    Hypertension Brother    Breast cancer Neg Hx    BRCA 1/2 Neg Hx     Social History Social History   Tobacco Use   Smoking status: Every Day    Current packs/day: 0.50    Average packs/day: 0.5 packs/day for 20.0 years (10.0 ttl pk-yrs)    Types: Cigarettes    Passive exposure: Current   Smokeless tobacco: Never  Vaping Use   Vaping status: Never Used  Substance Use Topics   Alcohol use: Yes    Comment: occasionally on weekends   Drug use: Never     Allergies   Sulfa  antibiotics   Review of Systems Review of Systems  ROS negative except as noted in HPI above   Physical Exam Triage Vital Signs ED Triage Vitals [12/27/23 0827]  Encounter Vitals Group     BP 134/87     Girls Systolic BP Percentile      Girls Diastolic BP Percentile      Boys Systolic BP Percentile      Boys Diastolic BP Percentile      Pulse Rate (!) 102     Resp 18     Temp 98.7 F (37.1 C)     Temp Source Oral     SpO2 97 %     Weight      Height      Head Circumference      Peak Flow      Pain Score 6     Pain Loc      Pain Education      Exclude from Growth Chart    No data found.  Updated Vital Signs BP 134/87 (BP Location: Right Arm)   Pulse (!) 102   Temp 98.7 F (37.1 C) (Oral)   Resp 18   [name] 02/23/2021 (Approximate)   SpO2 97%   Pulse recheck on my exam was 87 bpm  Visual  Acuity Right Eye Distance:   Left Eye Distance:   Bilateral Distance:    Right Eye Near:   Left Eye Near:    Bilateral Near:     Physical Exam Vitals and nursing note reviewed.  Constitutional:      General: She is not in acute distress.    Appearance: She is well-developed.  HENT:     Head: Normocephalic and atraumatic.  Eyes:     Conjunctiva/sclera: Conjunctivae normal.  Neck:     Comments: Reduced right rotation of neck, right sided spurling positive Cardiovascular:     Rate and Rhythm: Normal rate and regular rhythm.     Heart sounds: No murmur heard. Pulmonary:     Effort: Pulmonary effort is normal. No respiratory distress.     Breath sounds: Normal breath sounds.  Abdominal:     Palpations: Abdomen is soft.     Tenderness: There is no abdominal tenderness.  Musculoskeletal:        General: No swelling.     Right forearm: Swelling (distal volar w/o erythema on my exam today), edema and tenderness (diffuse ttp over volar forearm; not taught enough for immediate concern of compartment syndrome) present. No deformity or lacerations.     Left forearm: Normal.     Right hand: No tenderness. Decreased range of motion (trouble with finger extension 2/2 forearm pain). Normal strength. Normal sensation. Normal capillary refill. Normal pulse.     Left hand: No tenderness. Normal strength. Normal sensation. Normal capillary refill. Normal pulse.  Cervical back: Neck supple.     Right lower leg: Right lower leg edema: on HR recheck.     Comments: Decreased right shoulder strength with inability to abduct fully overhead without causing radicular pain shooting down into her right hand.  Skin:    General: Skin is warm and dry.     Capillary Refill: Capillary refill takes less than 2 seconds.  Neurological:     Mental Status: She is alert.  Psychiatric:        Mood and Affect: Mood normal.      UC Treatments / Results  Labs (all labs ordered are listed, but only abnormal  results are displayed) Labs Reviewed - No data to display  EKG   Radiology No results found.  Procedures Procedures (including critical care time)  Medications Ordered in UC Medications - No data to display  Initial Impression / Assessment and Plan / UC Course  I have reviewed the triage vital signs and the nursing notes.  Pertinent labs & imaging results that were available during my care of the patient were reviewed by me and considered in my medical decision making (see chart for details).     Right upper extremity radiculopathy and right volar forearm swelling Final Clinical Impressions(s) / UC Diagnoses   #Radicular right upper extremity pain #Right forearm pain with swelling - Differential diagnoses include cervical radiculopathy, Parsonage-Turner syndrome, cellulitis, flexor tenosynovitis, acute forearm compartment syndrome, superficial venous thrombosis versus other.  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 -Will initiate treatment to cover a broad range of options including antibiotics for potential cellulitis given reported history of increased erythema yesterday, steroids for consideration of Parsonage-Turner, and pain medicine. -Also discussed strict return precautions and reasons to visit ER for advanced imaging including increased difficulty utilizing right hand, approximately spreading erythema, increased swelling, increased numbness and tingling of right hand, or new onset fever/chills.  - Patient understands and agrees to treatment plan.  No further questions or concerns at this time.  Final diagnoses:  None   Discharge Instructions   None    ED Prescriptions   None    PDMP not reviewed this encounter.   Lynwood Barter, DO 12/27/23 1008

## 2023-12-27 NOTE — Discharge Instructions (Addendum)
 Take prednisone  40 mg once daily at the practice for the next 5 days Take doxycycline  twice daily for the next 7 days Take ibuprofen  up to 3 times a day as needed for pain Remember reasons to visit ER for advanced imaging include increased difficulty utilizing right hand, approximately spreading erythema, increased forearm swelling/pain, increased numbness and tingling of right hand, or new onset fever/chills.

## 2023-12-27 NOTE — ED Triage Notes (Signed)
 Pt here for right lower arm pain near wrist x 1 week with worsening this am and swelling; pt sts painful to move her hand; pt denies obvious injury

## 2023-12-28 ENCOUNTER — Ambulatory Visit (HOSPITAL_COMMUNITY): Payer: Self-pay

## 2023-12-29 ENCOUNTER — Ambulatory Visit (HOSPITAL_COMMUNITY)
Admission: EM | Admit: 2023-12-29 | Discharge: 2023-12-29 | Disposition: A | Discharge status: Home / Self Care | Attending: Internal Medicine | Admitting: Internal Medicine

## 2023-12-29 ENCOUNTER — Ambulatory Visit (HOSPITAL_COMMUNITY): Payer: Self-pay

## 2023-12-29 ENCOUNTER — Encounter (HOSPITAL_COMMUNITY): Payer: Self-pay | Admitting: *Deleted

## 2023-12-29 DIAGNOSIS — M25511 Pain in right shoulder: Secondary | ICD-10-CM | POA: Diagnosis not present

## 2023-12-29 MED ORDER — CYCLOBENZAPRINE HCL 5 MG PO TABS: 5.0000 mg | ORAL_TABLET | Freq: Two times a day (BID) | ORAL | 0 refills | Status: AC | PRN

## 2023-12-29 NOTE — Discharge Instructions (Signed)
 I have prescribed you a muscle relaxer in addition to other medications that were prescribed at previous visit.  Please be aware that this can make you drowsy.  Please initiate antibiotic as well.  Continue prednisone .  I recommend that you follow-up with orthopedist given symptoms have been persistent.  I have attached to two different orthopedist offices that you may contact for follow-up.  They also have walk-in clinics.
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
## 2023-12-30 ENCOUNTER — Other Ambulatory Visit: Payer: Self-pay | Admitting: Family Medicine

## 2023-12-30 DIAGNOSIS — R059 Cough, unspecified: Secondary | ICD-10-CM

## 2024-01-13 ENCOUNTER — Other Ambulatory Visit: Payer: Self-pay | Admitting: Family Medicine

## 2024-01-17 ENCOUNTER — Other Ambulatory Visit: Payer: Self-pay | Admitting: Family Medicine

## 2024-02-15 ENCOUNTER — Encounter: Payer: Self-pay | Admitting: Family Medicine

## 2024-02-15 ENCOUNTER — Ambulatory Visit: Admitting: Family Medicine

## 2024-02-15 ENCOUNTER — Ambulatory Visit: Payer: Self-pay | Admitting: Family Medicine

## 2024-02-15 ENCOUNTER — Other Ambulatory Visit (HOSPITAL_COMMUNITY)
Admission: RE | Admit: 2024-02-15 | Discharge: 2024-02-15 | Disposition: A | Discharge status: Home / Self Care | Source: Ambulatory Visit | Attending: Family Medicine | Admitting: Family Medicine

## 2024-02-15 VITALS — BP 102/60 | HR 86 | Ht 62.0 in | Wt 155.0 lb

## 2024-02-15 DIAGNOSIS — N898 Other specified noninflammatory disorders of vagina: Secondary | ICD-10-CM | POA: Diagnosis present

## 2024-02-15 DIAGNOSIS — Z1159 Encounter for screening for other viral diseases: Secondary | ICD-10-CM | POA: Diagnosis not present

## 2024-02-15 DIAGNOSIS — Z113 Encounter for screening for infections with a predominantly sexual mode of transmission: Secondary | ICD-10-CM | POA: Diagnosis not present

## 2024-02-15 DIAGNOSIS — Z23 Encounter for immunization: Secondary | ICD-10-CM | POA: Diagnosis not present

## 2024-02-15 DIAGNOSIS — L732 Hidradenitis suppurativa: Secondary | ICD-10-CM

## 2024-02-15 DIAGNOSIS — M5412 Radiculopathy, cervical region: Secondary | ICD-10-CM

## 2024-02-15 LAB — POCT URINALYSIS DIP (MANUAL ENTRY)
Blood, UA: NEGATIVE
Glucose, UA: NEGATIVE mg/dL
Leukocytes, UA: NEGATIVE
Nitrite, UA: NEGATIVE
Spec Grav, UA: 1.025
Urobilinogen, UA: 0.2 U/dL
pH, UA: 6

## 2024-02-15 LAB — POCT WET PREP (WET MOUNT)
Clue Cells Wet Prep Whiff POC: POSITIVE
Trichomonas Wet Prep HPF POC: ABSENT
WBC, Wet Prep HPF POC: NONE SEEN

## 2024-02-15 MED ORDER — GABAPENTIN 100 MG PO CAPS: 100.0000 mg | ORAL_CAPSULE | Freq: Three times a day (TID) | ORAL | 3 refills | Status: AC | PRN

## 2024-02-15 MED ORDER — METRONIDAZOLE 0.75 % VA GEL: 1.0000 | Freq: Every day | VAGINAL | 0 refills | Status: AC

## 2024-02-15 NOTE — Patient Instructions (Signed)
 Thank you for visiting clinic today and allowing us  to participate in your care!  We are ordering more advanced imaging of your neck (MRI) to further evaluate your pain. In the meantime, please try taking gabapentin  up to 3 times a day as needed to help with nerve pain. We have also referred you to Physical therapy.   We collected routine STI/STD workup today and will keep you updated.  Please schedule an appointment in 1 month for follow up.   Reach out any time with any questions or concerns you may have - we are here for you!  Damien Cassis, MD [facility] [phone]

## 2024-02-15 NOTE — Progress Notes (Signed)
" ° ° °  SUBJECTIVE:   CHIEF COMPLAINT / HPI:   Vaginal discharge Notes increased vaginal discharge for past week Also reports itchiness  No pain Interested in routine STI/STD testing, hepatitis testing, and urinalysis  Radiculopathy Ongoing for months  Pain of R neck shooting all the way down the arm Not really burning or tingling, mostly pain  No injury or trauma  Was seen in UC and given antibiotics and steroids, though did not take steroid   OBJECTIVE:   BP 102/60   Pulse 86   Ht 5' 2 (1.575 m)   Wt 155 lb (70.3 kg)   [name] 02/23/2021   SpO2 98%   BMI 28.35 kg/m   General: Well-appearing. No acute distress.  CV: Normal S1/S2. No extra heart sounds. Warm and well-perfused. Pulm: Breathing comfortably on room air. CTAB. No increased WOB. Skin:  Warm, dry. GU: Surgically absent cervix with vaginal cuff. Moderate amount of white discharge present. Exam chaperoned by CMA.  MSK: Normal and equal sensation of BUE. Normal 5/5 strength of BUE. Normal R shoulder ROM. Positive Spurlings with head tilt to the right.    ASSESSMENT/PLAN:   Assessment & Plan Vaginal discharge Routine screening for STI (sexually transmitted infection) Encounter for hepatitis C screening test for low risk patient - Wet prep with findings suggestive of BV - discussed course of Metrogel  x 5 days - G/C, Trich pending - HIV, RPR ordered - No Hep B immunization documented - Hep B antibody ordered - Hep C antibody ordered Cervical radiculopathy Presentation and exam consistent with cervical radiculopathy. Low suspicion for acute bony change or acute neurologic etiology given normal ROM, strength, sensation, and nontender to palpation. Recent C-spine XR 12/2023 with degenerative disc changes at C5-C6. Discussed and ordered MRI C-spine for further evaluation and characterization. Discussed PT and trial of gabapentin  TID PRN for symptom management.  Encounter for immunization Discussed and received annual flu  vaccine today.   RTC in 1 month for follow up.   Damien Cassis, MD [facility]  "

## 2024-02-16 LAB — CERVICOVAGINAL ANCILLARY ONLY
Chlamydia: NEGATIVE
Comment: NEGATIVE
Comment: NEGATIVE
Comment: NORMAL
Neisseria Gonorrhea: NEGATIVE
Trichomonas: NEGATIVE

## 2024-02-16 LAB — HIV ANTIBODY (ROUTINE TESTING W REFLEX): HIV Screen 4th Generation wRfx: NONREACTIVE

## 2024-02-16 LAB — HEPATITIS B SURFACE ANTIBODY, QUANTITATIVE: Hepatitis B Surf Ab Quant: <3.5 m[IU]/mL — ABNORMAL LOW

## 2024-02-16 LAB — HEPATITIS C ANTIBODY: Hep C Virus Ab: NONREACTIVE

## 2024-02-16 LAB — SYPHILIS: RPR W/REFLEX TO RPR TITER AND TREPONEMAL ANTIBODIES, TRADITIONAL SCREENING AND DIAGNOSIS ALGORITHM: RPR Ser Ql: NONREACTIVE

## 2024-02-16 NOTE — Assessment & Plan Note (Signed)
-   Wet prep with findings suggestive of BV - discussed course of Metrogel  x 5 days - G/C, Trich pending - HIV, RPR ordered - No Hep B immunization documented - Hep B antibody ordered - Hep C antibody ordered

## 2024-02-17 ENCOUNTER — Other Ambulatory Visit: Payer: Self-pay | Admitting: Family Medicine

## 2024-02-17 ENCOUNTER — Encounter: Payer: Self-pay | Admitting: Family Medicine

## 2024-02-17 MED ORDER — FLUCONAZOLE 150 MG PO TABS: ORAL_TABLET | ORAL | 0 refills | Status: AC

## 2024-02-17 NOTE — Progress Notes (Signed)
 Diflucan  rx sent per patient request for yeast infection sx while receiving Flagyl  for BV treatment.

## 2024-02-24 MED ORDER — DOXYCYCLINE HYCLATE 100 MG PO TABS: 100.0000 mg | ORAL_TABLET | Freq: Two times a day (BID) | ORAL | 0 refills | Status: AC
